# Patient Record
Sex: Male | Born: 1955 | Race: White | Hispanic: No | Marital: Single | Smoking: Never smoker
Health system: Southern US, Community
[De-identification: ages and names within clinical notes are randomized; demographics above are authoritative.]

## PROBLEM LIST (undated history)

## (undated) DIAGNOSIS — F319 Bipolar disorder, unspecified: Secondary | ICD-10-CM

## (undated) DIAGNOSIS — R279 Unspecified lack of coordination: Secondary | ICD-10-CM

## (undated) DIAGNOSIS — D696 Thrombocytopenia, unspecified: Secondary | ICD-10-CM

## (undated) DIAGNOSIS — I2510 Atherosclerotic heart disease of native coronary artery without angina pectoris: Secondary | ICD-10-CM

## (undated) DIAGNOSIS — E871 Hypo-osmolality and hyponatremia: Secondary | ICD-10-CM

## (undated) DIAGNOSIS — G459 Transient cerebral ischemic attack, unspecified: Secondary | ICD-10-CM

## (undated) DIAGNOSIS — E785 Hyperlipidemia, unspecified: Secondary | ICD-10-CM

## (undated) DIAGNOSIS — I7100 Dissection of unspecified site of aorta: Secondary | ICD-10-CM

## (undated) DIAGNOSIS — R451 Restlessness and agitation: Secondary | ICD-10-CM

## (undated) DIAGNOSIS — N189 Chronic kidney disease, unspecified: Secondary | ICD-10-CM

## (undated) DIAGNOSIS — R739 Hyperglycemia, unspecified: Secondary | ICD-10-CM

## (undated) DIAGNOSIS — Z789 Other specified health status: Secondary | ICD-10-CM

## (undated) DIAGNOSIS — E875 Hyperkalemia: Secondary | ICD-10-CM

## (undated) DIAGNOSIS — E860 Dehydration: Secondary | ICD-10-CM

## (undated) DIAGNOSIS — R4182 Altered mental status, unspecified: Secondary | ICD-10-CM

## (undated) DIAGNOSIS — D649 Anemia, unspecified: Secondary | ICD-10-CM

## (undated) DIAGNOSIS — I429 Cardiomyopathy, unspecified: Secondary | ICD-10-CM

## (undated) DIAGNOSIS — I10 Essential (primary) hypertension: Secondary | ICD-10-CM

## (undated) DIAGNOSIS — I639 Cerebral infarction, unspecified: Secondary | ICD-10-CM

## (undated) DIAGNOSIS — G9340 Encephalopathy, unspecified: Secondary | ICD-10-CM

## (undated) DIAGNOSIS — R1310 Dysphagia, unspecified: Secondary | ICD-10-CM

## (undated) DIAGNOSIS — F29 Unspecified psychosis not due to a substance or known physiological condition: Secondary | ICD-10-CM

## (undated) HISTORY — DX: Essential (primary) hypertension: I10

## (undated) HISTORY — PX: ASCENDING AORTIC ANEURYSM REPAIR: SHX1191

## (undated) HISTORY — DX: Dissection of unspecified site of aorta: I71.00

## (undated) HISTORY — DX: Cerebral infarction, unspecified: I63.9

---

## 2000-09-27 ENCOUNTER — Inpatient Hospital Stay (HOSPITAL_COMMUNITY): Admission: AD | Admit: 2000-09-27 | Discharge: 2000-10-04 | Payer: Self-pay | Admitting: Cardiology

## 2000-09-28 ENCOUNTER — Encounter: Payer: Self-pay | Admitting: Neurology

## 2000-09-28 ENCOUNTER — Encounter: Payer: Self-pay | Admitting: Pediatrics

## 2000-10-03 ENCOUNTER — Encounter: Payer: Self-pay | Admitting: Internal Medicine

## 2000-10-26 ENCOUNTER — Encounter: Payer: Self-pay | Admitting: Cardiology

## 2000-12-13 ENCOUNTER — Encounter (HOSPITAL_COMMUNITY): Admission: RE | Admit: 2000-12-13 | Discharge: 2001-01-12 | Payer: Self-pay | Admitting: Pulmonary Disease

## 2001-01-14 ENCOUNTER — Encounter (HOSPITAL_COMMUNITY): Admission: RE | Admit: 2001-01-14 | Discharge: 2001-02-13 | Payer: Self-pay | Admitting: Pulmonary Disease

## 2001-11-27 ENCOUNTER — Ambulatory Visit (HOSPITAL_COMMUNITY): Admission: RE | Admit: 2001-11-27 | Discharge: 2001-11-27 | Payer: Self-pay | Admitting: Pulmonary Disease

## 2002-02-18 ENCOUNTER — Encounter: Admission: RE | Admit: 2002-02-18 | Discharge: 2002-05-19 | Payer: Self-pay | Admitting: Pulmonary Disease

## 2003-11-12 ENCOUNTER — Emergency Department (HOSPITAL_COMMUNITY): Admission: EM | Admit: 2003-11-12 | Discharge: 2003-11-12 | Payer: Self-pay | Admitting: Emergency Medicine

## 2003-11-14 ENCOUNTER — Emergency Department (HOSPITAL_COMMUNITY): Admission: EM | Admit: 2003-11-14 | Discharge: 2003-11-14 | Payer: Self-pay | Admitting: Emergency Medicine

## 2005-08-31 ENCOUNTER — Inpatient Hospital Stay (HOSPITAL_COMMUNITY): Admission: EM | Admit: 2005-08-31 | Discharge: 2005-09-03 | Payer: Self-pay | Admitting: Emergency Medicine

## 2005-09-01 ENCOUNTER — Encounter: Payer: Self-pay | Admitting: Cardiology

## 2005-09-01 ENCOUNTER — Ambulatory Visit: Payer: Self-pay | Admitting: Cardiology

## 2005-09-02 ENCOUNTER — Ambulatory Visit: Payer: Self-pay | Admitting: Psychiatry

## 2006-12-21 ENCOUNTER — Inpatient Hospital Stay: Payer: Self-pay

## 2006-12-22 ENCOUNTER — Other Ambulatory Visit: Payer: Self-pay

## 2007-03-20 ENCOUNTER — Encounter: Payer: Self-pay | Admitting: Cardiothoracic Surgery

## 2007-03-20 ENCOUNTER — Inpatient Hospital Stay (HOSPITAL_COMMUNITY): Admission: RE | Admit: 2007-03-20 | Discharge: 2007-04-04 | Payer: Self-pay | Admitting: Cardiothoracic Surgery

## 2007-03-20 ENCOUNTER — Encounter: Payer: Self-pay | Admitting: Pulmonary Disease

## 2007-03-20 ENCOUNTER — Ambulatory Visit: Payer: Self-pay | Admitting: Cardiothoracic Surgery

## 2007-04-09 ENCOUNTER — Emergency Department (HOSPITAL_COMMUNITY): Admission: EM | Admit: 2007-04-09 | Discharge: 2007-04-09 | Payer: Self-pay | Admitting: *Deleted

## 2007-04-18 ENCOUNTER — Ambulatory Visit: Payer: Self-pay | Admitting: Cardiovascular Disease

## 2007-06-25 ENCOUNTER — Emergency Department (HOSPITAL_COMMUNITY): Admission: EM | Admit: 2007-06-25 | Discharge: 2007-06-25 | Payer: Self-pay | Admitting: Emergency Medicine

## 2007-06-26 ENCOUNTER — Inpatient Hospital Stay (HOSPITAL_COMMUNITY): Admission: AD | Admit: 2007-06-26 | Discharge: 2007-07-02 | Payer: Self-pay | Admitting: Psychiatry

## 2007-06-26 ENCOUNTER — Other Ambulatory Visit: Payer: Self-pay | Admitting: Emergency Medicine

## 2007-06-26 ENCOUNTER — Ambulatory Visit: Payer: Self-pay | Admitting: Psychiatry

## 2007-07-29 ENCOUNTER — Ambulatory Visit: Payer: Self-pay | Admitting: Cardiology

## 2007-07-29 ENCOUNTER — Ambulatory Visit (HOSPITAL_COMMUNITY): Admission: RE | Admit: 2007-07-29 | Discharge: 2007-07-29 | Payer: Self-pay | Admitting: Internal Medicine

## 2007-08-05 ENCOUNTER — Ambulatory Visit: Payer: Self-pay | Admitting: Internal Medicine

## 2009-01-21 ENCOUNTER — Ambulatory Visit (HOSPITAL_COMMUNITY): Admission: RE | Admit: 2009-01-21 | Discharge: 2009-01-21 | Payer: Self-pay | Admitting: Pulmonary Disease

## 2009-01-23 ENCOUNTER — Emergency Department (HOSPITAL_COMMUNITY): Admission: EM | Admit: 2009-01-23 | Discharge: 2009-01-23 | Payer: Self-pay | Admitting: Emergency Medicine

## 2009-02-12 ENCOUNTER — Encounter: Payer: Self-pay | Admitting: Internal Medicine

## 2009-03-12 DIAGNOSIS — I7100 Dissection of unspecified site of aorta: Secondary | ICD-10-CM | POA: Insufficient documentation

## 2009-03-12 DIAGNOSIS — I10 Essential (primary) hypertension: Secondary | ICD-10-CM

## 2009-12-06 ENCOUNTER — Inpatient Hospital Stay (HOSPITAL_COMMUNITY): Admission: EM | Admit: 2009-12-06 | Discharge: 2009-12-14 | Payer: Self-pay | Admitting: Emergency Medicine

## 2009-12-06 ENCOUNTER — Ambulatory Visit: Payer: Self-pay | Admitting: Cardiology

## 2009-12-07 ENCOUNTER — Encounter (INDEPENDENT_AMBULATORY_CARE_PROVIDER_SITE_OTHER): Payer: Self-pay | Admitting: Pulmonary Disease

## 2010-02-21 ENCOUNTER — Ambulatory Visit: Payer: Self-pay | Admitting: Cardiovascular Disease

## 2010-02-21 ENCOUNTER — Encounter: Payer: Self-pay | Admitting: Cardiovascular Disease

## 2010-02-21 ENCOUNTER — Ambulatory Visit (HOSPITAL_COMMUNITY): Admission: RE | Admit: 2010-02-21 | Discharge: 2010-02-21 | Payer: Self-pay | Admitting: Cardiovascular Disease

## 2010-02-21 DIAGNOSIS — I43 Cardiomyopathy in diseases classified elsewhere: Secondary | ICD-10-CM

## 2010-02-23 ENCOUNTER — Encounter: Payer: Self-pay | Admitting: Cardiovascular Disease

## 2010-09-25 ENCOUNTER — Encounter: Payer: Self-pay | Admitting: Cardiothoracic Surgery

## 2010-10-04 NOTE — Letter (Signed)
Summary: [place] Results Engineer, agricultural at [facility]  [address], [place] [address]   Phone: [phone]  Fax: [phone]      February 21, 2010 MRN: [id number]   [facility] [address] [place], [place]  [address]   Dear Mr. Benedetti,  Your test ordered by Selena Batten has been reviewed by your physician (or physician assistant) and was found to be normal or stable. Your physician (or physician assistant) felt no changes were needed at this time.  ____ Echocardiogram  ____ Cardiac Stress Test  ____ Lab Work  ____ Peripheral vascular study of arms, legs or neck  __X__ CT scan or X-ray (chest)  ____ Lung or Breathing test  ____ Other: Please continue on current medical treatment.  Thank you.  Charlton Haws, MD, F.A.C.C

## 2010-10-04 NOTE — Assessment & Plan Note (Signed)
Summary: ROV  Medications Added RESTORIL 15 MG CAPS (TEMAZEPAM) TAKE 1 TAB AT BEDTIME LABETALOL HCL 200 MG TABS (LABETALOL HCL) TAKE 1 TAB two times a day POTASSIUM CHLORIDE 20 MEQ PACK (POTASSIUM CHLORIDE) TAKE 1 TAB DAILY GLYBURIDE 5 MG TABS (GLYBURIDE) TAKE 1 TAB DAILY ASPIRIN 325 MG TABS (ASPIRIN) TAKE 1 TAB DAILY HALOPERIDOL 1 MG TABS (HALOPERIDOL) TAKE 1 TAB two times a day DIVALPROEX SODIUM 250 MG TBEC (DIVALPROEX SODIUM) TAKE 1 TAB two times a day SIMVASTATIN 40 MG TABS (SIMVASTATIN) TAKE 1 TAB DAILY CLONIDINE HCL 0.3 MG TABS (CLONIDINE HCL) TAKE 1 TAB DAILY RAMIPRIL 10 MG CAPS (RAMIPRIL) TAKE 1 TAB DAILY      Allergies Added: NKDA  Visit Type:  Follow-up Primary Provider:  Nehemiah Settle  CC:  NO CARDIOLOGY COMPLAINTS.  History of Present Illness: Derrick Butler is a patient most recently seen by Dr Tenny Craw.  Multiple previous visits with Dr Dietrich Pates and Wall.  Previous history of Aortic disection with repair by Dr Morton Peters in 2008.  History of decreased LV function ? HTN DCM.  Echo in 2006 showed improvement to 50-55%.  CRF's include HTN, DM his lipid status is unknown to me.  He has an ill defined psychiatric illness and has had a previous CVA  He denies SSCP, dsypnea, palpitaitons, syncope or edema.  He is compliant with his meds.  Current Problems (verified): 1)  Aortic Dissection  (ICD-441.00) 2)  Hypertension, Unspecified  (ICD-401.9)  Current Medications (verified): 1)  Ramipril 10 Mg Caps (Ramipril) .... Take One Capsule By Mouth Daily 2)  Restoril 15 Mg Caps (Temazepam) .... Take 1 Tab At Bedtime 3)  Labetalol Hcl 200 Mg Tabs (Labetalol Hcl) .... Take 1 Tab Two Times A Day 4)  Potassium Chloride 20 Meq Pack (Potassium Chloride) .... Take 1 Tab Daily 5)  Glyburide 5 Mg Tabs (Glyburide) .... Take 1 Tab Daily 6)  Aspirin 325 Mg Tabs (Aspirin) .... Take 1 Tab Daily 7)  Haloperidol 1 Mg Tabs (Haloperidol) .... Take 1 Tab Two Times A Day  Allergies (verified): No Known  Drug Allergies  Past History:  Past Medical History: Last updated: 03/12/2009 AORTIC DISSECTION (ICD-441.00) HYPERTENSION, UNSPECIFIED (ICD-401.9) CVA Diabetes Cardiomhopathy    Past Surgical History: Last updated: 03/12/2009 Repair of ascending aorta.  Family History: Last updated: 03/12/2009 Mother with hx Alzheimer's  Father had Heart problemsf CVA  Social History: Last updated: 03/12/2009 Tobacco Use - No.  Alcohol Use - no  Review of Systems       Denies fever, malais, weight loss, blurry vision, decreased visual acuity, cough, sputum, SOB, hemoptysis, pleuritic pain, palpitaitons, heartburn, abdominal pain, melena, lower extremity edema, claudication, or rash.   Vital Signs:  Patient profile:   55 year old male Height:      68 inches Weight:      179 pounds BMI:     27.32 Pulse rate:   61 / minute Pulse rhythm:   regular Resp:     12 per minute BP sitting:   124 / 75  (right arm)  Vitals Entered By: Dreama Saa, CNA (February 21, 2010 11:45 AM)  Physical Exam  General:  Affect appropriate Healthy:  appears stated age HEENT: normal Neck supple with no adenopathy JVP normal no bruits no thyromegaly Lungs clear with no wheezing and good diaphragmatic motion Heart:  S1/S2 no murmur,rub, gallop or click PMI normal Abdomen: benighn, BS positve, no tenderness, no AAA no bruit.  No HSM or HJR Distal pulses intact with  no bruits No edema Neuro non-focal Skin warm and dry    Impression & Recommendations:  Problem # 1:  AORTIC DISSECTION (ICD-441.00) F/U CXR today to screen for mediastinal size.  no pain  Problem # 2:  HYPERTENSION, UNSPECIFIED (ICD-401.9) Well controlled The following medications were removed from the medication list:    Ramipril 10 Mg Caps (Ramipril) .[name]... Take one capsule by mouth daily His updated medication list for this problem includes:    Labetalol Hcl 200 Mg Tabs (Labetalol hcl) .[name]... Take 1 tab two times a day    Aspirin  325 Mg Tabs (Aspirin) .[name]... Take 1 tab daily    Clonidine Hcl 0.3 Mg Tabs (Clonidine hcl) .[name]... Take 1 tab daily    Ramipril 10 Mg Caps (Ramipril) .[name]... Take 1 tab daily  Problem # 3:  CARDIOMYOPATHY OTHER DISEASES CLASSIFIED ELSW (ICD-425.8)  No signs of CHF.  F/U echo to reassess EF.  No history of CAD The following medications were removed from the medication list:    Ramipril 10 Mg Caps (Ramipril) .[name]... Take one capsule by mouth daily His updated medication list for this problem includes:    Labetalol Hcl 200 Mg Tabs (Labetalol hcl) .[name]... Take 1 tab two times a day    Aspirin 325 Mg Tabs (Aspirin) .[name]... Take 1 tab daily    Ramipril 10 Mg Caps (Ramipril) .[name]... Take 1 tab daily  Orders: T-Chest x-ray, 2 views ([address]) 2-D Echocardiogram (2D Echo)  Patient Instructions: 1)  Your physician recommends that you schedule a follow-up appointment in: 1 year 2)  Your physician recommends that you continue on your current medications as directed. Please refer to the Current Medication list given to you today. 3)  A chest x-ray takes a picture of the organs and structures inside the chest, including the heart, lungs, and blood vessels. This test can show several things, including, whether the heart is enlarged; whether fluid is building up in the lungs; and whether pacemaker / defibrillator leads are still in place. 4)  Your physician has requested that you have an echocardiogram.  Echocardiography is a painless test that uses sound waves to create images of your heart. It provides your doctor with information about the size and shape of your heart and how well your heart's chambers and valves are working.  This procedure takes approximately one hour. There are no restrictions for this procedure.

## 2010-10-04 NOTE — Letter (Signed)
Summary: [place] Results Engineer, agricultural at [facility]  [address] [place] [address]   Phone: [phone]  Fax: [phone]      February 23, 2010 MRN: [id number]   [facility] [address] [place], [place]  [address]   Dear Mr. Schlender,  Your test ordered by Selena Batten has been reviewed by your physician (or physician assistant) and was found to be normal or stable. Your physician (or physician assistant) felt no changes were needed at this time.  __X__ Echocardiogram  ____ Cardiac Stress Test  ____ Lab Work  ____ Peripheral vascular study of arms, legs or neck  ____ CT scan or X-ray  ____ Lung or Breathing test  ____ Other: Please continue on current medical treatment.  Thank you.   Charlton Haws, MD, F.A.C.C

## 2010-11-23 LAB — GLUCOSE, CAPILLARY
Glucose-Capillary: 112 mg/dL — ABNORMAL HIGH (ref 70–99)
Glucose-Capillary: 124 mg/dL — ABNORMAL HIGH (ref 70–99)
Glucose-Capillary: 131 mg/dL — ABNORMAL HIGH (ref 70–99)
Glucose-Capillary: 138 mg/dL — ABNORMAL HIGH (ref 70–99)
Glucose-Capillary: 138 mg/dL — ABNORMAL HIGH (ref 70–99)
Glucose-Capillary: 140 mg/dL — ABNORMAL HIGH (ref 70–99)
Glucose-Capillary: 141 mg/dL — ABNORMAL HIGH (ref 70–99)
Glucose-Capillary: 158 mg/dL — ABNORMAL HIGH (ref 70–99)
Glucose-Capillary: 174 mg/dL — ABNORMAL HIGH (ref 70–99)
Glucose-Capillary: 199 mg/dL — ABNORMAL HIGH (ref 70–99)
Glucose-Capillary: 202 mg/dL — ABNORMAL HIGH (ref 70–99)
Glucose-Capillary: 206 mg/dL — ABNORMAL HIGH (ref 70–99)
Glucose-Capillary: 206 mg/dL — ABNORMAL HIGH (ref 70–99)
Glucose-Capillary: 214 mg/dL — ABNORMAL HIGH (ref 70–99)
Glucose-Capillary: 229 mg/dL — ABNORMAL HIGH (ref 70–99)
Glucose-Capillary: 244 mg/dL — ABNORMAL HIGH (ref 70–99)
Glucose-Capillary: 245 mg/dL — ABNORMAL HIGH (ref 70–99)
Glucose-Capillary: 247 mg/dL — ABNORMAL HIGH (ref 70–99)
Glucose-Capillary: 305 mg/dL — ABNORMAL HIGH (ref 70–99)
Glucose-Capillary: 416 mg/dL — ABNORMAL HIGH (ref 70–99)
Glucose-Capillary: >600 mg/dL (ref 70–99)

## 2010-11-23 LAB — BASIC METABOLIC PANEL
BUN: 15 mg/dL (ref 6–23)
BUN: 18 mg/dL (ref 6–23)
BUN: 27 mg/dL — ABNORMAL HIGH (ref 6–23)
BUN: 30 mg/dL — ABNORMAL HIGH (ref 6–23)
BUN: 7 mg/dL (ref 6–23)
CO2: 15 mEq/L — ABNORMAL LOW (ref 19–32)
CO2: 22 mEq/L (ref 19–32)
Calcium: 8.1 mg/dL — ABNORMAL LOW (ref 8.4–10.5)
Calcium: 8.8 mg/dL (ref 8.4–10.5)
Chloride: 110 mEq/L (ref 96–112)
Chloride: 116 mEq/L — ABNORMAL HIGH (ref 96–112)
Chloride: 94 mEq/L — ABNORMAL LOW (ref 96–112)
Creatinine, Ser: 1.48 mg/dL (ref 0.4–1.5)
Creatinine, Ser: 2.13 mg/dL — ABNORMAL HIGH (ref 0.4–1.5)
GFR calc Af Amer: 36 mL/min — ABNORMAL LOW (ref >60)
GFR calc Af Amer: >60 mL/min (ref >60)
GFR calc non Af Amer: 45 mL/min — ABNORMAL LOW (ref >60)
Glucose, Bld: 125 mg/dL — ABNORMAL HIGH (ref 70–99)
Glucose, Bld: 149 mg/dL — ABNORMAL HIGH (ref 70–99)
Glucose, Bld: 302 mg/dL — ABNORMAL HIGH (ref 70–99)
Glucose, Bld: 742 mg/dL (ref 70–99)
Potassium: 4 mEq/L (ref 3.5–5.1)
Potassium: 4.3 mEq/L (ref 3.5–5.1)
Potassium: 5.3 mEq/L — ABNORMAL HIGH (ref 3.5–5.1)
Sodium: 139 mEq/L (ref 135–145)

## 2010-11-23 LAB — CBC
HCT: 33 % — ABNORMAL LOW (ref 39.0–52.0)
HCT: 34.6 % — ABNORMAL LOW (ref 39.0–52.0)
Hemoglobin: 12.5 g/dL — ABNORMAL LOW (ref 13.0–17.0)
Hemoglobin: 13.9 g/dL (ref 13.0–17.0)
MCHC: 36.1 g/dL — ABNORMAL HIGH (ref 30.0–36.0)
MCHC: 36.2 g/dL — ABNORMAL HIGH (ref 30.0–36.0)
MCV: 86 fL (ref 78.0–100.0)
MCV: 86.2 fL (ref 78.0–100.0)
MCV: 87 fL (ref 78.0–100.0)
Platelets: 100 10*3/uL — ABNORMAL LOW (ref 150–400)
Platelets: 79 10*3/uL — ABNORMAL LOW (ref 150–400)
Platelets: 87 10*3/uL — ABNORMAL LOW (ref 150–400)
RBC: 3.99 MIL/uL — ABNORMAL LOW (ref 4.22–5.81)
RBC: 4.01 MIL/uL — ABNORMAL LOW (ref 4.22–5.81)
RBC: 4.54 MIL/uL (ref 4.22–5.81)
RDW: 12.7 % (ref 11.5–15.5)
RDW: 12.9 % (ref 11.5–15.5)
RDW: 13.1 % (ref 11.5–15.5)
WBC: 6.2 10*3/uL (ref 4.0–10.5)
WBC: 8.1 10*3/uL (ref 4.0–10.5)

## 2010-11-23 LAB — BLOOD GAS, ARTERIAL
Bicarbonate: 14.1 mEq/L — ABNORMAL LOW (ref 20.0–24.0)
FIO2: 21 %
O2 Saturation: 96.6 %
pCO2 arterial: 26.5 mmHg — ABNORMAL LOW (ref 35.0–45.0)
pO2, Arterial: 86.3 mmHg (ref 80.0–100.0)

## 2010-11-23 LAB — URINALYSIS, ROUTINE W REFLEX MICROSCOPIC
Bilirubin Urine: NEGATIVE
Glucose, UA: >1000 mg/dL — AB
Ketones, ur: 15 mg/dL — AB
Leukocytes, UA: NEGATIVE
Nitrite: NEGATIVE
Protein, ur: NEGATIVE mg/dL
Specific Gravity, Urine: <1.005 — ABNORMAL LOW (ref 1.005–1.030)
pH: 5.5 (ref 5.0–8.0)

## 2010-11-23 LAB — COMPREHENSIVE METABOLIC PANEL
ALT: 16 U/L (ref 0–53)
AST: 28 U/L (ref 0–37)
Albumin: 3.1 g/dL — ABNORMAL LOW (ref 3.5–5.2)
BUN: 27 mg/dL — ABNORMAL HIGH (ref 6–23)
CO2: 14 mEq/L — ABNORMAL LOW (ref 19–32)
CO2: 16 mEq/L — ABNORMAL LOW (ref 19–32)
Calcium: 8.5 mg/dL (ref 8.4–10.5)
Chloride: 110 mEq/L (ref 96–112)
Creatinine, Ser: 1.96 mg/dL — ABNORMAL HIGH (ref 0.4–1.5)
Creatinine, Ser: 2.14 mg/dL — ABNORMAL HIGH (ref 0.4–1.5)
GFR calc Af Amer: 44 mL/min — ABNORMAL LOW (ref >60)
GFR calc non Af Amer: 32 mL/min — ABNORMAL LOW (ref >60)
GFR calc non Af Amer: 36 mL/min — ABNORMAL LOW (ref >60)
Glucose, Bld: 289 mg/dL — ABNORMAL HIGH (ref 70–99)
Potassium: 4.2 mEq/L (ref 3.5–5.1)
Sodium: 137 mEq/L (ref 135–145)
Total Bilirubin: 0.8 mg/dL (ref 0.3–1.2)
Total Bilirubin: 1 mg/dL (ref 0.3–1.2)

## 2010-11-23 LAB — DIFFERENTIAL
Basophils Absolute: 0 10*3/uL (ref 0.0–0.1)
Basophils Absolute: 0 10*3/uL (ref 0.0–0.1)
Basophils Absolute: 0 10*3/uL (ref 0.0–0.1)
Basophils Absolute: 0 10*3/uL (ref 0.0–0.1)
Basophils Absolute: 0 10*3/uL (ref 0.0–0.1)
Basophils Relative: 1 % (ref 0–1)
Basophils Relative: 1 % (ref 0–1)
Basophils Relative: 1 % (ref 0–1)
Eosinophils Absolute: 0.1 10*3/uL (ref 0.0–0.7)
Eosinophils Absolute: 0.1 10*3/uL (ref 0.0–0.7)
Eosinophils Absolute: 0.1 10*3/uL (ref 0.0–0.7)
Eosinophils Absolute: 0.2 10*3/uL (ref 0.0–0.7)
Eosinophils Relative: 2 % (ref 0–5)
Eosinophils Relative: 2 % (ref 0–5)
Eosinophils Relative: 3 % (ref 0–5)
Lymphocytes Relative: 27 % (ref 12–46)
Lymphocytes Relative: 27 % (ref 12–46)
Lymphocytes Relative: 36 % (ref 12–46)
Lymphs Abs: 1.6 10*3/uL (ref 0.7–4.0)
Monocytes Absolute: 0.3 10*3/uL (ref 0.1–1.0)
Monocytes Absolute: 0.4 10*3/uL (ref 0.1–1.0)
Monocytes Absolute: 0.6 10*3/uL (ref 0.1–1.0)
Monocytes Relative: 6 % (ref 3–12)
Monocytes Relative: 8 % (ref 3–12)
Neutro Abs: 2.2 10*3/uL (ref 1.7–7.7)
Neutro Abs: 4.1 10*3/uL (ref 1.7–7.7)
Neutrophils Relative %: 50 % (ref 43–77)

## 2010-11-23 LAB — RAPID URINE DRUG SCREEN, HOSP PERFORMED
Barbiturates: NOT DETECTED
Opiates: NOT DETECTED
Tetrahydrocannabinol: NOT DETECTED

## 2010-11-23 LAB — URINE CULTURE: Colony Count: 50000

## 2010-11-23 LAB — URINE MICROSCOPIC-ADD ON

## 2010-11-23 LAB — CARDIAC PANEL(CRET KIN+CKTOT+MB+TROPI)
CK, MB: 3.5 ng/mL (ref 0.3–4.0)
CK, MB: 6.9 ng/mL (ref 0.3–4.0)
CK, MB: 9.4 ng/mL (ref 0.3–4.0)
Relative Index: 0.4 (ref 0.0–2.5)
Relative Index: 0.6 (ref 0.0–2.5)
Total CK: 1099 U/L — ABNORMAL HIGH (ref 7–232)
Total CK: 1442 U/L — ABNORMAL HIGH (ref 7–232)
Total CK: 1934 U/L — ABNORMAL HIGH (ref 7–232)
Total CK: 919 U/L — ABNORMAL HIGH (ref 7–232)
Troponin I: 0.11 ng/mL — ABNORMAL HIGH (ref 0.00–0.06)
Troponin I: 0.16 ng/mL — ABNORMAL HIGH (ref 0.00–0.06)
Troponin I: 0.17 ng/mL — ABNORMAL HIGH (ref 0.00–0.06)
Troponin I: 0.17 ng/mL — ABNORMAL HIGH (ref 0.00–0.06)

## 2010-11-23 LAB — VALPROIC ACID LEVEL: Valproic Acid Lvl: <10 ug/mL — ABNORMAL LOW (ref 50.0–100.0)

## 2010-11-23 LAB — FOLATE: Folate: 13.6 ng/mL

## 2010-11-23 LAB — AMMONIA: Ammonia: 20 umol/L (ref 11–35)

## 2010-11-23 LAB — FERRITIN: Ferritin: 253 ng/mL (ref 22–322)

## 2010-11-23 LAB — RETICULOCYTES
Retic Count, Absolute: 114.8 10*3/uL (ref 19.0–186.0)
Retic Ct Pct: 2.7 % (ref 0.4–3.1)

## 2010-11-23 LAB — MRSA PCR SCREENING: MRSA by PCR: NEGATIVE

## 2010-11-23 LAB — CK TOTAL AND CKMB (NOT AT ARMC)
CK, MB: 3.1 ng/mL (ref 0.3–4.0)
Relative Index: 2.6 — ABNORMAL HIGH (ref 0.0–2.5)
Total CK: 121 U/L (ref 7–232)

## 2010-11-23 LAB — D-DIMER, QUANTITATIVE: D-Dimer, Quant: 1.02 ug/mL-FEU — ABNORMAL HIGH (ref 0.00–0.48)

## 2010-12-13 ENCOUNTER — Emergency Department (HOSPITAL_COMMUNITY)
Admission: EM | Admit: 2010-12-13 | Discharge: 2010-12-13 | Disposition: A | Discharge status: Home / Self Care | Payer: Medicare Other | Attending: Emergency Medicine | Admitting: Emergency Medicine

## 2010-12-13 DIAGNOSIS — I10 Essential (primary) hypertension: Secondary | ICD-10-CM | POA: Insufficient documentation

## 2010-12-13 DIAGNOSIS — Z7982 Long term (current) use of aspirin: Secondary | ICD-10-CM | POA: Insufficient documentation

## 2010-12-13 DIAGNOSIS — N390 Urinary tract infection, site not specified: Secondary | ICD-10-CM | POA: Insufficient documentation

## 2010-12-13 DIAGNOSIS — I2510 Atherosclerotic heart disease of native coronary artery without angina pectoris: Secondary | ICD-10-CM | POA: Insufficient documentation

## 2010-12-13 DIAGNOSIS — I252 Old myocardial infarction: Secondary | ICD-10-CM | POA: Insufficient documentation

## 2010-12-13 DIAGNOSIS — Z79899 Other long term (current) drug therapy: Secondary | ICD-10-CM | POA: Insufficient documentation

## 2010-12-13 DIAGNOSIS — IMO0002 Reserved for concepts with insufficient information to code with codable children: Secondary | ICD-10-CM | POA: Insufficient documentation

## 2010-12-13 DIAGNOSIS — F319 Bipolar disorder, unspecified: Secondary | ICD-10-CM | POA: Insufficient documentation

## 2010-12-13 DIAGNOSIS — Z8673 Personal history of transient ischemic attack (TIA), and cerebral infarction without residual deficits: Secondary | ICD-10-CM | POA: Insufficient documentation

## 2010-12-13 DIAGNOSIS — E119 Type 2 diabetes mellitus without complications: Secondary | ICD-10-CM | POA: Insufficient documentation

## 2010-12-13 LAB — URINALYSIS, ROUTINE W REFLEX MICROSCOPIC
Glucose, UA: NEGATIVE mg/dL
Ketones, ur: NEGATIVE mg/dL
Nitrite: NEGATIVE
Specific Gravity, Urine: >1.03 — ABNORMAL HIGH (ref 1.005–1.030)
pH: 5.5 (ref 5.0–8.0)

## 2010-12-13 LAB — BASIC METABOLIC PANEL
Calcium: 9.1 mg/dL (ref 8.4–10.5)
GFR calc Af Amer: 41 mL/min — ABNORMAL LOW (ref >60)
GFR calc non Af Amer: 34 mL/min — ABNORMAL LOW (ref >60)
Glucose, Bld: 102 mg/dL — ABNORMAL HIGH (ref 70–99)
Potassium: 4.9 mEq/L (ref 3.5–5.1)
Sodium: 138 mEq/L (ref 135–145)

## 2010-12-13 LAB — DIFFERENTIAL
Basophils Absolute: 0 10*3/uL (ref 0.0–0.1)
Basophils Relative: 1 % (ref 0–1)
Eosinophils Absolute: 0.2 10*3/uL (ref 0.0–0.7)
Eosinophils Relative: 3 % (ref 0–5)
Lymphocytes Relative: 36 % (ref 12–46)
Monocytes Absolute: 0.3 10*3/uL (ref 0.1–1.0)

## 2010-12-13 LAB — URINE MICROSCOPIC-ADD ON

## 2010-12-13 LAB — CBC
MCHC: 35.6 g/dL (ref 30.0–36.0)
Platelets: 104 10*3/uL — ABNORMAL LOW (ref 150–400)
RDW: 12 % (ref 11.5–15.5)
WBC: 5.8 10*3/uL (ref 4.0–10.5)

## 2010-12-15 LAB — URINE CULTURE
Colony Count: 6000
Culture  Setup Time: 201204110531

## 2011-01-05 ENCOUNTER — Emergency Department (HOSPITAL_COMMUNITY)
Admission: EM | Admit: 2011-01-05 | Discharge: 2011-01-05 | Disposition: A | Discharge status: Home / Self Care | Payer: Medicare Other | Attending: Emergency Medicine | Admitting: Emergency Medicine

## 2011-01-05 DIAGNOSIS — I10 Essential (primary) hypertension: Secondary | ICD-10-CM | POA: Insufficient documentation

## 2011-01-05 DIAGNOSIS — E860 Dehydration: Secondary | ICD-10-CM | POA: Insufficient documentation

## 2011-01-05 DIAGNOSIS — I252 Old myocardial infarction: Secondary | ICD-10-CM | POA: Insufficient documentation

## 2011-01-05 DIAGNOSIS — Z8673 Personal history of transient ischemic attack (TIA), and cerebral infarction without residual deficits: Secondary | ICD-10-CM | POA: Insufficient documentation

## 2011-01-05 DIAGNOSIS — G939 Disorder of brain, unspecified: Secondary | ICD-10-CM | POA: Insufficient documentation

## 2011-01-05 DIAGNOSIS — F319 Bipolar disorder, unspecified: Secondary | ICD-10-CM | POA: Insufficient documentation

## 2011-01-05 DIAGNOSIS — I2510 Atherosclerotic heart disease of native coronary artery without angina pectoris: Secondary | ICD-10-CM | POA: Insufficient documentation

## 2011-01-05 DIAGNOSIS — Z7982 Long term (current) use of aspirin: Secondary | ICD-10-CM | POA: Insufficient documentation

## 2011-01-05 DIAGNOSIS — E119 Type 2 diabetes mellitus without complications: Secondary | ICD-10-CM | POA: Insufficient documentation

## 2011-01-05 DIAGNOSIS — F29 Unspecified psychosis not due to a substance or known physiological condition: Secondary | ICD-10-CM | POA: Insufficient documentation

## 2011-01-05 DIAGNOSIS — Z79899 Other long term (current) drug therapy: Secondary | ICD-10-CM | POA: Insufficient documentation

## 2011-01-05 DIAGNOSIS — L408 Other psoriasis: Secondary | ICD-10-CM | POA: Insufficient documentation

## 2011-01-05 LAB — BASIC METABOLIC PANEL
BUN: 38 mg/dL — ABNORMAL HIGH (ref 6–23)
CO2: 21 mEq/L (ref 19–32)
Calcium: 9.2 mg/dL (ref 8.4–10.5)
Chloride: 105 mEq/L (ref 96–112)
Creatinine, Ser: 2.33 mg/dL — ABNORMAL HIGH (ref 0.4–1.5)

## 2011-01-05 LAB — CBC
Hemoglobin: 13.5 g/dL (ref 13.0–17.0)
MCH: 30.7 pg (ref 26.0–34.0)
MCHC: 35.4 g/dL (ref 30.0–36.0)
MCV: 86.6 fL (ref 78.0–100.0)
Platelets: 108 10*3/uL — ABNORMAL LOW (ref 150–400)
RBC: 4.4 MIL/uL (ref 4.22–5.81)

## 2011-01-05 LAB — DIFFERENTIAL
Eosinophils Absolute: 0.2 10*3/uL (ref 0.0–0.7)
Lymphs Abs: 1.9 10*3/uL (ref 0.7–4.0)
Monocytes Absolute: 0.5 10*3/uL (ref 0.1–1.0)
Monocytes Relative: 7 % (ref 3–12)
Neutrophils Relative %: 61 % (ref 43–77)

## 2011-01-17 NOTE — Discharge Summary (Signed)
NAME:  Derrick Butler, Derrick Butler NO.:  [id number]   MEDICAL RECORD NO.:  [id number]          PATIENT TYPE:  INP   LOCATION:  2017                         FACILITY:  [facility]   PHYSICIAN:  Sheliah Plane, MD    DATE OF BIRTH:  1956/07/07   DATE OF ADMISSION:  03/20/2007  DATE OF DISCHARGE:                               DISCHARGE SUMMARY   HISTORY OF THE PRESENT ILLNESS:  The patient is a 55 year old male with  a previous known history of hypertensive cardiomyopathy, with a  documented ejection fraction of 33%.  He reportedly presented to his  medical doctor on the day prior to admission with hypertension.  At that  time, his medications were changed. At approximately 3 a.m. on the date  of admission, he developed upper sternal burning and presented to the  emergency room at [facility].  He was initially seen at 6:01  a.m. at [facility], and a CT scan done at approximately 10:00 revealed  evidence of a type 1 aortic dissection.  He was transferred to [facility] for surgical evaluation and operative repair.  Of note,  the patient presented with acute psychosis.  He was barely able to speak  his name, but did follow simple commands, moving all extremities to  command, with equal grip.  He was felt to be oversedated upon arrival to  the hospital as well.  He was unable to give any history on his own, and  therefore other records were gleaned from his previous records  available.   PAST MEDICAL HISTORY:  1. Hypertension, with known hypertensive cardiomyopathy.  2. Type 2 diabetes mellitus.  3. Chronic renal insufficiency, with reportedly baseline creatinine of      approximately 1.7.  4. He also has a history of previous psychosis, having been treated by      a psychiatrist.  5. He also has a history of a non-ST elevation myocardial infarction      in 2006.   FAMILY HISTORY:  Unobtainable.   SOCIAL HISTORY:  The patient was found to be a nonsmoker,  nondrinker.   MEDICATIONS PRIOR TO ADMISSION FROM HIS PUBLISHED PREVIOUS RECORDS:  1. Glyburide 5 mg twice daily.  2. Metformin 500 mg p.o. b.i.d.  3. Trazodone 100-200 mg p.r.n.  4. Nitroglycerin p.r.n.  5. Plavix 75 mg daily.  6. Lisinopril 20 mg daily  7. Toprol-XL 50 mg daily.  8. Clonidine 0.2 mg every morning, and 0.1 mg q.evening.   REVIEW OF SYMPTOMS/PHYSICAL EXAMINATION:  Please see the history and  physical notes on admission.   LABORATORY VALUES RECEIVED FROM [facility]:  Included a CT scan  which revealed a type 1 aortic dissection.  There was evidence of a  significant pericardial hematoma with tamponade as well.   The patient was unable to give consent, but through his power of  attorney consent was obtained to proceed with the surgical repair.   PROCEDURE:  The patient was taken from the emergency department to the  operating room.  He underwent the following procedure:  Replacement of  ascending aorta, with  resuspension of aortic valve, with circulatory  arrest and cardiopulmonary bypass.  Procedure was performed by Sheliah Plane, M.D.  He tolerated it well, and he was taken to the surgical  intensive care unit in serious condition.   POSTOPERATIVE HOSPITAL COURSE:  The patient has overall done well.  He  has required treatment for postoperative coagulopathy, including  transfusion of packed cells, platelets, and fresh frozen plasma.  He  improved following this, and all routine lines, monitors, and drainage  devices have been discontinued in the standard fashion.  He initially  did require some nitroglycerin and Neo-Synephrine, but these were weaned  also without difficulty.  He was weaned from the ventilator following  improvement from his sedation.  He was started on a course of gentle  diuresis, for which he has responded well.  During the postoperative  period, he has had some intermittent confusion but has slowly improved.  Additionally, he has  had some difficulty controlling his blood pressure.  Multiple agents have been added, and this issue is stabilized.  His  activity has slowly increased, and he is tolerating routine cardiac  rehabilitation modalities.  His currently laboratories are stable, with  most recent hemoglobin and hematocrit dated March 26, 2007 at 9.7 and  28.2, respectively.  His electrolytes, BUN, and creatinine are within  normal limits, but they will be checked again prior to discharge.  His  incision is healing well, without evidence of infection.  His diabetes  is under adequate control, and  he is restarted on his metformin.  Tentatively, he is felt to require placement in a nursing facility for  at least a short-term rehabilitation, as he has no significant family  help in the area.  These plans are being managed by the social work and  care management system, and final disposition is currently pending.   CURRENT MEDICATIONS AT THE TIME OF THIS DICTATION:  1. Haldol 5 mg b.i.d.  2. Aspirin 325 mg daily.  3. Colace 200 mg daily.  4. Dulcolax 10 mg daily.  5. Protonix 40 mg daily.  6. Catapres patch 0.3 mg q.7 days.  7. Altace 10 mg b.i.d.  8. Labetalol 200 mg b.i.d.  9. For pain, oxycodone 5 mg q.3 h. p.r.n. as needed.   FOLLOWUP:  The patient will be instructed to follow up with Dr. Tyrone Sage  approximately 3 weeks postdischarge, the office number is [phone], for  an appointment to be arranged, and a chest x-ray will be obtained at  that time.   FINAL DIAGNOSES:  1. Acute type 1 aortic dissection, now status post repair, as      described above.  2. Chronic renal insufficiency.  3. Coronary artery disease, status post myocardial infarction.  4. History of a stroke, with short-term memory difficulty in 2003.  5. History of diabetes.  6. History of hypertension.  7. History of psychiatric illness which is not clearly defined from      the chart records.   INSTRUCTIONS:  The patient will be  instructed for wound care which will  be to clean his incision gently with soap and water.  He is also  instructed on sternal precautions, not to lift greater than 10 pounds  for the next 6 weeks.  He is not to drive.  Capillary blood glucoses  should continue to be monitored.  The patient may shower.  He is  instructed to increase his walking daily per routine cardiac  rehabilitation instructions.  Rowe Clack, P.A.-C.      Sheliah Plane, MD  Electronically Signed    WEG/MEDQ  D:  03/28/2007  T:  03/28/2007  Job:  [id number]   cc:   Ramon Dredge L. Juanetta Gosling, M.D.  Gerrit Friends. Dietrich Pates, MD, [facility]

## 2011-01-17 NOTE — Discharge Summary (Signed)
NAME:  Derrick Butler, Derrick Butler NO.:  [id number]   MEDICAL RECORD NO.:  [id number]          PATIENT TYPE:  IPS   LOCATION:  0404                          [facility]ILITY:  [facility]   PHYSICIAN:  Geoffery Lyons, M.D.      DATE OF BIRTH:  [birth year]   DATE OF ADMISSION:  06/26/2007  DATE OF DISCHARGE:  07/02/2007                               DISCHARGE SUMMARY   CHIEF COMPLAINT/PRESENT ILLNESS:  This was the first admission to [facility] for this 55 year old male voluntarily admitted.  History of depression reported, troubled mind.  Initially assessed to  have some delusional ideas.   PAST PSYCHIATRIC HISTORY:  First time at [facility].   ALCOHOL AND DRUG HISTORY:  Denies active any substance use.   MEDICAL HISTORY:  Arterial hypertension, coronary artery disease,  noninsulin dependent diabetes mellitus, CVA.   MEDICATIONS:  1. Aspirin 81 mg per day.  2. Catapres-TTS-3 change every 7 days.  3. Colace 100 mg per day.  4. Dulcolax 50 mg per day.  5. Haldol 5 mg twice a day.  6. Hydrocodone 10-500 as needed.  7. Labetalol 200 mg per day.  8. Metformin 500 mg twice a day.  9. Altace 10 mg twice a day.  10.Protonix 40 mg per day.   PHYSICAL EXAMINATION:  Reveals a male who is in bed exhibiting poor eye  contact.  He is not spontaneous.  Somewhat irritable.  Would not  volunteer information.  Cognition preserved.   ADMITTING DIAGNOSES:  AXIS I:  Bipolar disorder.  AXIS II:  No diagnosis.  AXIS III:  Noninsulin dependent diabetes mellitus, arterial  hypertension, coronary artery disease.  AXIS IV:  Moderate.  AXIS V:  Upon admission 38.  GAF in the last year 60.   COURSE IN THE HOSPITAL:  He was admitted.  He was started on individual  and group psychotherapy.  He was initially maintained on his  medications.  Initially upon initial assessment, he was assessed to be  delusional, wanting to save people, very intrusive, agitated, restless,  pressured speech.   Residing in an assisted living facility.  He had quit  taking some of his medication, and he felt he was over medicated.  He  was seen at [facility], but he could not be  seen for medication evaluation.  Dr. Juanetta Gosling felt he was decompensated  and recommended him for inpatient treatment.  He was somewhat agitated  with pressured speech.  Endorsed that he felt that he was taking the  wrong medication.  He endorsed that he experienced side effects when he  takes the medication.  He gets ill,  and raises his voice too loud.  Been taking the same meds for 2 or 3 years.  Endorses that he got upset  because this 55 year old man that lived in the assisted living facility  began cussing  at staff.  He said that he was in that assisted living  facility while he recovers, and there is numerous medical conditions.  Seemed to be some underlying paranoia, suspiciousness, feeling that the  medication needed  to be changed.  Diagnosed bipolar and placed on  Haldol, mostly keeping to himself, little  interaction.  He was started  on Depakote.  Eye contact was minimal difficulty.  Difficult to engage  in conversation.  Speech became more pressured as he became more  agitated, somewhat circumstantial, tangential.  He tolerated the  Depakote.  On October 28, he was in full contact with reality.  There  were no active suicidal or homicidal ideas, no hallucinations or  delusions.  He had tolerated the medication well and was willing and  motivated to pursue them.  He was going to return to the assisted living  facility.   DISCHARGE DIAGNOSES:  AXIS I:  Bipolar disorder, impulse control not  otherwise specified.  AXIS II:  No diagnosis.  AXIS III:  Noninsulin dependent diabetes mellitus, arterial  hypertension, coronary artery disease.  AXIS IV:  Moderate.  AXIS V:  Global assessment of function upon discharge 50 to 55.   DISCHARGE MEDICATIONS:  1. Clonidine 0.3 TTS patch  weekly.  2. Colace 100 mg twice a day.  3. Altace 10 mg twice a day.  4. Protonix 40 mg per day.  5. Glucophage 500 mg twice a day.  6. Normodyne 200 mg per day.  7. Dulcolax 10 mg per day.  8. Aspirin 325 mg per day.  9. Haldol 1 mg twice a day.  10.Depakote 250 three times a day.   FOLLOWUP:  Followup [facility].      Geoffery Lyons, M.D.  Electronically Signed     IL/MEDQ  D:  07/23/2007  T:  07/24/2007  Job:  [id number]

## 2011-01-17 NOTE — Assessment & Plan Note (Signed)
[facility]                       [facility]ARDI[facility] OFFICE NOTE   ADMIR, CANDELAS                     MRN:          [id number]  DATE:04/18/2007                            DOB:          [birth year]    Mr. Derrick Butler is seen today at the request of Dr. Tyrone Sage.  I am not quite  sure of the referral.  The note in the chart was to evaluate for heart  surgery.  However, it is pretty clear that the patient has already had  this.  He was admitted on March 20, 2007 after presenting with  hypertension and an acute type 1 aortic dissection.  He was transferred  from [facility] to [facility] for emergent surgery.  At the time, he was  apparently in a coma.  Dr. Tyrone Sage and Dr. Donata Clay did an excellent  job and fixed his type 1 dissection.  He was discharged from the  hospital at the end of July 2008.  He was sent to [facility] as a long-  term skilled care facility.  He does not have family in the area.  He  has been seen on multiple occasions in the past by Dr. Daleen Squibb, Dr. Tenny Craw,  and Dr. Dietrich Pates.  He has primarily been seen for what appears to be  hypertensive cardiomyopathy.  He has had a previous CVA in 2002 and some  sort of psychiatric illness which is poorly defined.   His last echocardiogram in 2006 showed an EF of 50%-55%.  However, his  intraoperative TEE at the time of his aortic dissection done by  anesthesia suggested an EF in the 30%-35% range.  There was trace MR.   The patient's postoperative course was fairly unremarkable.  His  baseline creatinine returned to 1.7.  He had his baseline psychiatric  illness and psychosis.   DISCHARGE MEDICATIONS:  1. Metformin 500 b.i.d.  2. Trazodone 100-200 mg p.r.n.  3. Nitroglycerin.  4. Plavix 75 a day.  5. Lisinopril 20 a day.  6. Toprol 50 a day.  7. Clonidine 0.2 in the morning and 0.1 in the evening.   In talking to the patient, his history is somewhat difficult to elicit.  He has somewhat  halting speech.  At times, he has a blank stare going  forward.  He has no complaints at this time and feels that he is getting  along well after his surgery.   REVIEW OF SYSTEMS:  Otherwise negative.   CURRENT PHYSICAL EXAMINATION:  GENERAL:  Remarkable for a not quite  catatonic but subdued white middle-aged male in no distress.  Affect is  flat.  VITAL SIGNS:  Weight is 180, blood pressure is 115/78 in each arm, pulse  is 78 and regular, he is afebrile, respiratory rate is 14.  HEENT:  Normal.  NECK:  Carotids are normal, without bruit.  There is no lymphadenopathy,  no thyromegaly, no JVP elevation.  LUNGS:  Clear.  CHEST:  Good diaphragmatic motion.  Sternum is well healed.  CARDIAC:  There is an S1, S2, with a soft systolic murmur.  PMI is  normal.  ABDOMEN:  Benign.  There is no AAA, no bruits, no hepatosplenomegaly or  hepatojugular reflux, no tenderness.  EXTREMITIES:  Distal pulses are intact, with no edema.  There is no  bruit.  SKIN:  Warm and dry.  NEUROLOGIC:  Remarkable for some tremulousness in the upper extremities.  He has weakness in his quadriceps and walks hunched over with a walker.  He has decreased muscle tone in his quadriceps and has difficulty  elevating his body on stepping.  However, there are no focal defects  that I can tell.   IMPRESSION:  1. Recent aortic dissection secondary to hypertension.  The patient      has had what appears to be a remarkable recovery.  At some point in      the future, he should probably have a baseline CT scan to evaluate      his repair.  I will have him see Dr. Dietrich Pates or Dr. Tenny Craw in 3      months for a baseline echocardiogram to see where his LV function      lies.  Looking through the chart, there is a question of a      subendocardial MI back in 2006, but most of the chart indicates      hypertensive cardiomyopathy, with previous EFs of 33% as far back      as 2006.  2. Cardiomyopathy.  Blood pressure under good  control.  Check      echocardiogram in 3 months.  3. Psychiatric disease.  Further to be elucidated at [facility].  The      patient had been on Depakote and Seroquel in the past.  He was sent      home on trazodone instead.  He seems to think that Dr. Juanetta Gosling is      his medical doctor, and hopefully this will be followed up by him.  4. Diabetes.  Currently on metformin 500 b.i.d.  Follow up hemoglobin      A1c and sugars at [facility] and with primary care doctor.   Overall, I think it is important to let the patient recover from his  type 1 aortic dissection, but at some point in the next 3 months he  should probably have a baseline CT scan and echocardiogram to further  assess his aortic repair and LV function.     Noralyn Pick. Eden Emms, MD, [facility]  Electronically Signed    PCN/MedQ  DD: 04/18/2007  DT: 04/19/2007  Job #: [id number]

## 2011-01-17 NOTE — Op Note (Signed)
NAME:  KEONTAY, VORA NO.:  [id number]   MEDICAL RECORD NO.:  [id number]          PATIENT TYPE:  INP   LOCATION:  2316                         FACILITY:  [facility]   PHYSICIAN:  Sheliah Plane, MD    DATE OF BIRTH:  May 05, 1956   DATE OF PROCEDURE:  03/20/2007  DATE OF DISCHARGE:                               OPERATIVE REPORT   PREOPERATIVE DIAGNOSIS:  Acute type-I aortic dissection.   POSTOPERATIVE DIAGNOSIS:  Acute type-I aortic dissection.   SURGICAL PROCEDURES:  Replacement of ascending aorta and resuspension of  aortic valve with circulatory arrest and cardiopulmonary bypass.   SURGEON:  Sheliah Plane, M.D.   FIRST ASSISTANT:  Kerin Perna, M.D.   BRIEF HISTORY:  The patient is a 55 year old male who presented with  acute aortic dissection, whose history is outlined in history and  physical note of the same day.  The patient was transferred urgently  from [facility] after he was found to have type-I aortic  dissection with pericardial tamponade and clotted pericardium.  When the  patient presented he was barely arousable and was not able to give full  consent.  His power of attorney was contacted who agreed over the phone  to proceed with surgery.  In addition, in this emergency situation is  was unlikely the patient would survive more than an hour or two without  proceeding immediately with surgery.  He was taken directly to the  operating room from the [facility] emergency room.  Lines were placed  and he was intubated.   DESCRIPTION OF PROCEDURE:  With Swan-Ganz arterial line monitors in  place, the patient underwent general endotracheal anesthesia without  incidence.  Transesophageal echo probe was placed and confirmed very  trace aortic insufficiency, dilated ascending aorta with obvious  proximal ascending aorta flap.  Skin of the chest and legs was prepped  with Betadine and draped in the usual sterile manner.  Before a median  sternotomy was carried out, the right groin was opened and the right  femoral artery was identified and encircled with vessel loops.  The  patient was systemically heparinized.  A 24 straight arterial cannula  was placed into the right common femoral artery for arterial perfusion.  A median sternotomy was performed.  Pericardium was opened.  There was a  significant amount of clotted blood and 200 to 300 mL of blood in the  pericardial sac.  The proximal aorta was dilated with obvious  dissection.  A right atriovenous cannula was placed.  The patient was  placed on cardiopulmonary bypass at 2.4 liters per minute per m2.  A  right superior pulmonary vein vent was placed.  The patient's body  temperature was then cooled to 18 degrees.  Barbituratess and steroids  were given.  The head was also packed in ice with a nasal temperature of  17.  A circulatory arrest was commenced.   The aorta was opened.  The tear was easily identified approximately 1 cm  above the sinotubular ridge extending down toward the attachments of the  aortic valve between the left and noncoronary  cusp.  The ascending aorta  was trimmed and removed.  Using BioGlue the false and true lumen was  reapproximated distally.  A side-arm 28-mm Hemashield graft was then  sewed with felt strips in a running 3-0 Prolene to the distal ascending  aorta.  BioGlue was placed on the anastomosis.  With the graft sewn in  place, the arterial perfusion was then shifted to the side port.  The  arch and graft was allowed to fill and de-air and aortic crossclamp was  placed on the ascending aorta graft.  Cardiopulmonary bypass was then  resumed with a total circulatory arrest time of 36 minutes.  The  proximal aorta was then carefully examined.  A rent posteriorly was  extended down to the aortic valve and was closed with a running 4-0  Prolene and this was also used to resuspend the aortic valve.  With felt  strip around the aorta, the  proximal anastomosis was then performed  after gluing the true and false lumen together.  The left and right  coronary ostia were easily identified and had no evidence of  intraluminal obstruction.  The proximal anastomosis was then carried out  with a running 3-0 Prolene suture.  With the anastomosis completed,  BioGlue was placed around the anastomosis.  An aortic root vent was  stuck into the ascending aortic graft with a pledgeted suture.  This was  used to de-air the heart.  Retrograde cardioplegia had been previously  given.  Additional retrograde warm cardioplegia was administered as the  ascending aorta and heart was allowed to de-air through the aortic root  vent.  The aortic crossclamp was then removed with a total crossclamp  time of 79 minutes.  The patient spontaneously converted to a sinus  rhythm.  Several areas of the anastomosis that were oozing were stitched  with pledgeted 4-0 Prolene sutures with the anastomosis appearing to be  free of significant bleeding but was still with diffuse coagulopathy.  The patient's body temperature was completely rewarmed to 37 degrees.  During the rewarming process the right femoral aortic cannula was  removed and the femoral artery repaired with a running 6-0 Prolene.  Blood flow was restored to the right leg.  With the patient rewarmed to  37 degrees, he was then ventilated.  The right superior pulmonary vein  vent was removed, and he was weaned from cardiopulmonary bypass.  He  remained hemodynamically stable.  The right atrial cannula was removed.  Protamine sulfate was administered.  Platelets and fresh frozen were  administered.  With the operative field relatively hemostatic, the side  arm of the ascending aortic graft was doubly ligated.  Atrial and  ventricular pacing wires were applied.  Two Blake drains, one posterior  one anterior were replaced in the pericardium.  The sternum was then  closed with #6 stainless steel wire,  fascia closed with interrupted 0  Vicryl, running 3-0 Vicryl in the subcutaneous tissue, and 4-0  subcuticular stitch in the skin edges.  Dry dressings were applied.  Sponge and needle count was reported as correct at the completion of the  procedure.  The right groin incision was also closed with a running 2-0  Dexon suture and 4-0 subcuticular stitch in the skin edges.  Dry  dressing was applied.  The patient was then transferred to the surgical  intensive care unit in critical condition.  The patient's power of  attorney was notified of the completion of the case.  Total pump time  was 155 minutes.      Sheliah Plane, MD  Electronically Signed     EG/MEDQ  D:  03/21/2007  T:  03/22/2007  Job:  [id number]   cc:   Ramon Dredge L. Juanetta Gosling, M.D.  Gerrit Friends. Dietrich Pates, MD, [facility]

## 2011-01-17 NOTE — Procedures (Signed)
NAME:  Derrick Butler, Derrick Butler              ACCOUNT NO.:  [id number]   MEDICAL RECORD NO.:  [id number]          PATIENT TYPE:  OUT   LOCATION:  RAD                           FACILITY:  [facility]   PHYSICIAN:  Gerrit Friends. Dietrich Pates, MD, FACCDATE OF BIRTH:  02/13/1956   DATE OF PROCEDURE:  DATE OF DISCHARGE:                                ECHOCARDIOGRAM   CLINICAL DATA:  A 55 year old gentleman with prior CABG surgery,  hypertension, diabetes and cardiomyopathy.  Aorta 4.6, left atrium 3.9,  septum 1.6, posterior wall 1.5, LV diastole 4.6, LV systole 3.5.  1. Technically suboptimal but adequate echocardiographic study.  2. Left atrial size at the upper limit of normal; normal right atrium.  3. Normal right ventricular size; borderline hypertrophy; normal      systolic function.  4. Trileaflet and normal aortic valve with very mild aortic      insufficiency.  5. Mild to moderate dilatation of the proximal descending aorta; mild      to moderate calcification of the wall.  The remainder of the aortic      arch and the descending aorta appeared normal.  6. Normal tricuspid valve.  7. Normal mitral valve; mild annular calcification.  8. Normal left ventricular size; moderate hypertrophy; normal to      hyperdynamic regional and global function with an estimated      ejection fraction of 0.70.  9. Normal IVC.      Gerrit Friends. Dietrich Pates, MD, [facility]  Electronically Signed     RMR/MEDQ  D:  07/29/2007  T:  07/29/2007  Job:  [id number]

## 2011-01-17 NOTE — Op Note (Signed)
NAME:  Derrick Butler, Derrick Butler              ACCOUNT NO.:  [id number]   MEDICAL RECORD NO.:  [id number]          PATIENT TYPE:  EMS   LOCATION:  ED                            FAC[facility]  [facility]   PHYSICIAN:  Howard E. Frederick, M.D.DATE OF BIRTH:  July 29, 1956   DATE OF PROCEDURE:  03/20/2007  DATE OF DISCHARGE:                               OPERATIVE REPORT   PROCEDURE PERFORMED:  Transesophageal echocardiography.   The patient was brought to the [facility] operating room in extreme  condition secondary to a presumptive diagnosis of a ruptured ascending  thoracic aneurysm.  The patient was brought to the operating room in  stable but sedated condition.  Due to the patient's inability to  cooperate, he was induced under general anesthetic.  His airway was  secured.   After inducing the patient, invasive monitors were placed without  difficulty and the transesophageal echocardiography probe was placed  without difficulty.  After arrival of technical support, we were able to  identify a dramatic pericardial effusion consistent with bloody  tamponade.   Further examination of the heart revealed a thickened left ventricle and  cross section.  This was consistent with the patient's history of  hypertensive cardiomyopathy, ejection fraction was estimated to be  approximately 30%.  Evaluation of the patient's mitral valve revealed a  trace amount of mitral regurgitation, otherwise a normal-appearing  valve.   Evaluation of the patient's aortic valve apparatus revealed a tricuspid  aortic valve but there was clear evidence of a intimal flap tear in the  perivalvular area, in the area immediately distal to the aortic valve  there was evidence that the blood flow was within the structure of the  true lumen of the aorta with probable retrograde blood present in the  false lumen as evidenced by Color Flow Doppler.   Examination of the ascending and descending aorta was done and the views  were taped.   Distal evaluation of the aorta revealed evidence to suggest  that the patient's true lumen was quite small and that the false lumen  was probably compromising blood flow to the patient's mesentery vessels.  View of the actual tear was not evident due to the inability to  visualize the arch of the aorta.   The remainder of the cardiac structures were given a cursory examination  and appeared grossly normal.  Due to extreme nature of the surgery and  the patient's condition, more thorough examination will be deferred at  this time.           ______________________________  Derrick Butler, M.D.     CEF/MEDQ  D:  03/20/2007  T:  03/21/2007  Job:  [id number]
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
## 2011-01-17 NOTE — Assessment & Plan Note (Signed)
[facility]                       [place] CARDIOLOGY OFFICE NOTE   MATHAYUS, STANBERY                     MRN:          [id number]  DATE:08/05/2007                            DOB:          1955-11-16    IDENTIFICATION:  Mr. Derrick Butler is a 55 year old gentleman who I saw back in  2002 for the last time.  He is status post aorta repair for a type 1  aortic dissection this summer.  He was last seen by Maurine Cane on  August 14, comes in for continued care.  He denies chest pain.  He walks  up and down the hall at [facility].  Denies shortness of breath, notes  occasional dizziness but not a lot, no syncope, no chest pain.   MEDICATIONS:  1. Aspirin 325 daily.  2. Labetalol 200 daily.  3. Metformin 500 b.i.d.  4. Protonix 40 daily.  5. Altace 10 b.i.d.  6. Haldol b.i.d.  7. Depakote 250 t.i.d.  8. Clonidine 0.3 daily.   PHYSICAL EXAMINATION:  Patient is in no distress.  Blood pressure is  98/70, on my check 96/60, pulse is 56, weight is 173 pounds.  LUNGS:  Clear to auscultation.  CARDIAC:  Regular rate and rhythm, S1-S2; no S3, S4 or murmurs.  ABDOMEN:  Benign.  EXTREMITIES:  No edema.   Echocardiogram was recently done that showed moderate LVH, normal LV  function with an LVF of 70%.   IMPRESSION:  1. Dissection of aorta, status post repair, has done amazingly well.      Would continue current regimen but I would back down on his      Ramipril a little bit to 10 daily.  He will continue his other      medications, the dosing is a bit off with the clonidine but he is      calm and seems to be tolerating.  2. Health care maintenance.  Will check a fasting lipid, BMET, TSH as      his convenience.  He should be on some lipid lowering agent given      his diabetes and vascular disease.  I will be in touch with him      with the results, otherwise continue current regimen.  Followup 4-5      months.  3. Hypertension, again, back down on  Ramipril, followup in 4-5 months.    Pricilla Riffle, MD, [facility]  Electronically Signed   PVR/MedQ  DD: 08/05/2007  DT: 08/05/2007  Job #: [id number]

## 2011-01-20 NOTE — H&P (Signed)
NAME:  Derrick Butler, Derrick Butler              ACCOUNT NO.:  [id number]   MEDICAL RECORD NO.:  [id number]          PATIENT TYPE:  INP   LOCATION:  4741                         FACILITY:  [facility]   PHYSICIAN:  Jesse Sans. Wall, M.D.   DATE OF BIRTH:  [birth year]   DATE OF ADMISSION:  08/31/2005  DATE OF DISCHARGE:                                HISTORY & PHYSICAL   CHIEF COMPLAINT:  Fainting spell in the doctor's office.   HISTORY OF PRESENT ILLNESS:  Derrick Butler is a 55 year old gentleman with a  history of hypertension and noninsulin-dependent diabetes mellitus who has  had a previous CVA in 2002.  He has hypertensive cardiomyopathy with an EF  of 33% with no ischemia with an adenosine Cardiolite January 2002.  A 2-D  echocardiogram confirmed an EF of 30% with global hypokinesia.  He has had  negative carotid Dopplers in the past.   Today, while visiting Dr. Juanetta Gosling, he had a syncopal event while sitting on  the examining table.  This was right after he had his blood drawn.  He felt  nauseated before he passed out.  He had no other symptoms including chest  pain, shortness of breath or palpitations.   He has had no exertional symptoms suggestive of ischemia.  He has also had  no history of ventricular tachycardia or other arrhythmias.   PAST MEDICAL HISTORY:   ALLERGIES:  He has no known drug allergies.   MEDICATIONS PRIOR TO ADMISSION:  (We think, but he does not have a list) -  1.  Toprol-XL 100 mg daily.  2.  Lotrel 10/20 daily.  3.  Benicar 40 mg daily.  4.  Lasix 40 mg a day.  5.  Aspirin and Metaglip 5/500 b.i.d.   The emergency room list at [facility] shows -  1.  Catapres TTS, unknown dose.  2.  Aspirin 325 daily.  3.  Seroquel 25 mg b.i.d.  4.  Plavix 75 mg a day.  5.  Toprol 50 mg a day.  6.  Metaglip 5/500 b.i.d.  7.  Lotensin 20 mg daily.  8.  Depakote 500 mg, unknown frequency.   MEDICAL HISTORY:  1.  His past medical history is also significant for psychosis.  2.  He  has had chronic renal insufficiency with a baseline creatinine of      1.9.  He had an MRI and MRA of his renals in January 2002.  No      definitive stenosis could be ruled out.  His abdominal aorta was normal.   SLEEP ARCHITECTURE:  He lives in [place] with his sister.  He has  no children.  He has never been married.  He is disabled.  He is a prior  worker in a [employer].   FAMILY HISTORY:  Remarkable for his mother who has Alzheimer's and currently  is at [employer].  His father had a stroke and a heart problem and is  deceased.   REVIEW OF SYSTEMS:  Unremarkable, other than in HPI.  He denies any blood  loss.   PHYSICAL EXAMINATION:  GENERAL:  He is in no acute distress.  Skin is warm  and dry.  He is very talkative.  VITAL SIGNS:  Blood pressure is 136/86 on IV nitroglycerin.  He was  hypertensive in the emergency room at [facility] and en route with [facility].  Systolics were running as high as 190 en route.  His pulse was 73 and  regular.  Saturation 99% on 2 liters.  Temperature is 97.5.  HEENT:  Normocephalic and atraumatic, except for a few scars.  Pupils equal,  round and reactive to light.  Extraocular movement are intact.  Sclerae are  clear.  He has an upper plate with 5 false teeth.  NECK:  Supple.  He has no JVD.  Carotid upstrokes are equal bilaterally  without bruits.  There is no lymphadenopathy.  CARDIOVASCULAR:  Normal PMI.  He has a 1/6 systolic murmur at the left  sternal border.  There is no gallop, there is no rub.  LUNGS:  Clear.  SKIN:  No rashes or lesions.  ABDOMEN:  Soft with good bowel sounds.  There is no tenderness or midline  bruit.  There is no organomegaly.  EXTREMITIES:  No clubbing, cyanosis, or edema.  Pulses are 2+/4+, dorsalis  pedis and posterior tibials bilaterally.  MUSCULOSKELETAL:  Grossly intact.  NEUROLOGIC:  Grossly intact.   CHEST X-RAY:  Chest x-ray is pending.   EKG:  EKG shows sinus rhythm with ST segment  changes consistent with LVH  with strain versus ischemia.   LABORATORY DATA:  Hemoglobin 16.3, platelets 156, white count 9.9, potassium  3.9, creatinine 1.6, BUN 28, sugar 148.  His calcium was 9.3.  BNP was 51.9.  CPK MB is negative.  Troponin is positive the 0.77.   ASSESSMENT AND PLAN:  The patient presents with what sounds like vasovagal  syncope; however, his troponin is mildly positive at 0.77.  CK-MB is  negative.  With his chronic renal insufficiency, we need to rule out chronic  elevation or troponin.  We also need to rule out an acute coronary syndrome.   PLAN:  1  Continue if nitroglycerin and heparin.  1.  Continue beta blocker and aspirin.  2.  2-D echocardiogram to assess left ventricular function.  3.  Cardiac enzymes.  If we see a trend upward in the troponin or positive      CPK MB, I have recommended cardiac catheterization to the patient.      Indications, risks, potential benefits were discussed.  He agreed with      the plan.  Will not do a V Gram __________ dye.  Will obtain a 2-D      echocardiogram to assess left ventricular function.  4.  Hold Metaglip.  5.  Hydrate with normal saline prior to catheterization, and careful not to      volume overload with his LV dysfunction.  6.  He will be kept on telemetry without any arrhythmias.      Thomas C. Wall, M.D.  Electronically Signed     TCW/MEDQ  D:  08/31/2005  T:  09/01/2005  Job:  [id number]   cc:   Ramon Dredge L. Juanetta Gosling, M.D.  Fax: [phone]   [place] Bing, M.D. [facility]  [address]  [address]  [place]  [place] [address]   Pricilla Riffle, M.D.  [address]  [address]  [place]  [place] [address]

## 2011-01-20 NOTE — Discharge Summary (Signed)
NAME:  Derrick, Butler NO.:  [id number]   MEDICAL RECORD NO.:  [id number]          PATIENT TYPE:  IPS   LOCATION:  0404                          FACILITY:  [facility]   PHYSICIAN:  Anselm Jungling, MD  DATE OF BIRTH:  [birth year]   DATE OF ADMISSION:  06/26/2007  DATE OF DISCHARGE:  07/02/2007                               DISCHARGE SUMMARY   IDENTIFYING DATA/REASON FOR ADMISSION:  This was an inpatient  psychiatric admission for Derrick Butler, a 55 year old single white male who  had been living in an assisted-living facility.  He had a history of  mental illness.  He was referred for treatment because of increasingly  intrusive and hyperreligious behaviors within his assisted-living  facility.  Please refer to the admission note for further details  pertaining to the symptoms, circumstances and history that led to his  hospitalization.   INITIAL DIAGNOSTIC IMPRESSION:  He was given initial AXIS I diagnosis of  psychosis not otherwise specified.   MEDICAL/LABORATORY:  The patient was seen at [facility]  and medically cleared there prior to transfer to our inpatient  psychiatric service.  After arrival, he was medically and physically  reassessed by the psychiatric nurse practitioner.  He came to [place] with a  medical history that included hypertension, diabetes mellitus, GERD, as  well as a history of aneurysm.  During his stay, he was continued on his  usual medications including clonidine patch, Altace, Protonix,  Glucophage, Normodyne, aspirin, and Colace.  There were no acute medical  issues during his stay.   HOSPITAL COURSE:  The patient was admitted to the adult inpatient  psychiatric service.  He presented as a well-nourished, normally-  developed adult male who was generally pleasant, cooperative, and  nonirritable.  There were no overt signs or symptoms of psychosis,  delusionality, or responses to internal stimuli.  He had come to [place] on a  regimen of Haldol 5 mg b.i.d.  It was judged that this was a higher dose  than necessary, as he was showing some signs of excess sedation.  Haldol  was subsequently reduced to 1 mg b.i.d.  To address the reported  irritability and hyperreligious and hyperactive behavior that had been  described prior to admission, he was placed on a trial of Depakote up to  250 mg t.i.d., which was well-tolerated.  The patient remained pleasant,  cooperative and nonirritable throughout his stay.   He was initially seen and evaluated by Dr. Geoffery Lyons.  The undersigned  assumed the patient's care on the fifth hospital day.  On that date, I  met with the patient, reviewed the chart, and discussed the patient's  case with the treatment staff.  By this time, we had learned that his  group home was open to allowing him to return.  By the eighth hospital  day, it appeared that the patient was no longer in need of inpatient  treatment.  He was pleasant, easily engageable in a one-to-one session,  and made no delusional statements.  He was alert and fully oriented.  He  indicated he felt the  addition of Depakote had been positive, although  he had difficulty explaining how or why.  In general, he stated that he  thought it was good for his nerves.  He reported his sleep was  improved and his appetite was good.  He was very open to returning to  [facility].   AFTERCARE:  The patient was to follow up at [facility] for appointment with Dr. Nedra Hai on July 15, 2007.  He was also  to follow up with Dr. Hurley Cisco for psychiatric appointment on August 07, 2007 at [facility].   DISCHARGE MEDICATIONS:  1. Clonidine 0.3 mg TTS patch weekly, next due July 04, 2007.  2. Colace 100 mg b.i.d.  3. Altace 10 mg b.i.d.  4. Protonix 40 mg daily.  5. Glucophage 500 mg b.i.d.  6. Normodyne 200 mg daily.  7. Dulcolax 10 mg daily.  8. Aspirin 325 mg  daily.  9. Haldol 1 mg b.i.d.  10.Depakote 250 mg t.i.d.   DISCHARGE DIAGNOSES:  AXIS I:  Schizoaffective disorder not otherwise  specified.  AXIS II:  Deferred.  AXIS III:  History of hypertension, aneurysm, gastroesophageal reflux  disease, diabetes mellitus.  AXIS IV:  Stressors:  Severe.  AXIS V:  GAF on discharge 55.      Anselm Jungling, MD  Electronically Signed     SPB/MEDQ  D:  07/03/2007  T:  07/03/2007  Job:  [id number]

## 2011-01-20 NOTE — Discharge Summary (Signed)
[place]. [facility]  Patient:    Derrick Butler, Derrick Butler                       MRN: [id number] Adm. Date:  [id number] Disc. Date: [id number] Attending:  Nelta Numbers Dictator:   Abelino Derrick, P.A.C. [facility] CC:         Dr. Esmeralda Arthur  Dr. Ellison Carwin   Referring Physician Discharge [place]  HISTORY OF PRESENT ILLNESS:  Derrick Butler is a 55 year old male seen September 27, 2000 at [facility] by Eliezer Champagne and Dr. Dietrich Pates for malignant hypertension.  Head CT showed evidence of previous CVAs.  He also had renal insufficiency with a creatinine of 1.9.  He was admitted to telemetry, echocardiogram was obtained.  He was transferred to [facility] for further evaluation.  He was seen in consult at [facility] by Dr. Sharene Skeans.  He was started on IV heparin.  Patient was treated for his hypertension with clonidine and beta blocker.  Echocardiogram was obtained and showed moderate LV dysfunction. He was seen in consult by the stroke team.  His blood pressure improved.  The plan is for long-term aspirin and Plavix and his heparin was discontinued. Patient had problems with psychosis during this hospitalization.  He became agitated and there were reports from the nursing staff of possible suicidal ideation.  He underwent a Cardiolite study done October 03, 2000 which showed no ischemia.  Patient was seen in consult by the psychiatric service, Dr. Jeanie Sewer.  Depakote was recommended.  Patient was ultimately discharged October 04, 2000.  DISCHARGE DIAGNOSES: 1. Hypertension, under better control at discharge. 2. History of prior cerebrovascular accidents with an acute left subcortical    infarct. 3. Transient renal insufficiency. 4. Confusion and psychosis, seen by Dr. Adelene Amas. [place] this admission. 5. Cardiomyopathy with an ejection fraction of 30%, negative Cardiolite this    admission.  PLAN:  The patient is to be discharged on medical therapy and follow up  with outpatient psychiatric care and with his family doctor in [place].  He did not tolerate OT or PT well.  DISCHARGE MEDICATIONS: 1. Depakote 500 mg h.s. 2. Seroquel 25 mg twice a day. 3. Coated aspirin q.d. 4. Plavix 75 mg a day. 5. Catapres-TTS-3 patch every week. 6. Toprol-XL 50 mg a day. 7. Norvasc 10 mg a day. 8. Lotensin 20 mg a day. 9. Hygroton 12.5 q.d.  LABORATORY DATA:  Echocardiogram showed overall LV function to be moderately decreased with an EF estimated at 30%.  There was diffuse wall motion depression.  Carotid artery Dopplers showed no significant plaque.  CT of the head without contrast showed bilateral old lacunar strokes.  MRI revealed advanced intracranial atherosclerotic disease.  Discharge laboratory:  White count 4.8, hemoglobin 13.3, hematocrit 38.7, platelets 129.  Sodium 136, potassium 4.3, BUN 30, creatinine 1.9. Homocysteine level 11.39.  Lipid profile showed a cholesterol of 167, HDL 48, LDL 97.  TSH 1.32.  Aldosterone level was 4.8.  Drug screen was negative except for opiates, which were positive.  DISPOSITION:  Patient is discharged in stable condition.  FOLLOW-UP:  With Dr. Moss Mc office.  He will contact Dr. Langston Masker office in [place] for further follow-up with him. DD:  10/22/00 TD:  10/22/00 Job: [id number]

## 2011-01-20 NOTE — Discharge Summary (Signed)
NAME:  Derrick Butler, Derrick Butler              ACCOUNT NO.:  [id number]   MEDICAL RECORD NO.:  [id number]          PATIENT TYPE:  INP   LOCATION:  4741                         FACILITY:  [facility]   PHYSICIAN:  Jonelle Sidle, M.D. LHCDATE OF BIRTH:  [birth year]   DATE OF ADMISSION:  08/31/2005  DATE OF DISCHARGE:  09/03/2005                                 DISCHARGE SUMMARY   PRINCIPAL DIAGNOSIS:  Vasovagal syncope.   OTHER DIAGNOSES:  1.  Presumed non-ST-elevation myocardial infarction with elevated troponin.  2.  Acute psychosis.  3.  Chronic renal insufficiency.  4.  Hypertension.  5.  Type 2 diabetes mellitus.  6.  Hypertensive cardiomyopathy with previously-documented EF of 33%.   ALLERGIES:  No known drug allergies.   PROCEDURES:  None.   HISTORY OF PRESENT ILLNESS:  This is a 55 year old white male with a prior  history of hypertension, diabetes, and hypertensive cardiomyopathy, who was  seen by Dr. Juanetta Butler in the outpatient clinic on August 31, 2005, and right  after having a venipuncture blood draw, the patient became nauseated and  experienced a syncopal event.  He was sent to [facility] for further  evaluation, and was noted to have an elevated troponin of 0.77 with normal  CK-MB.  A decision was made at that point to transfer him to [facility] for  further evaluation.   HOSPITAL COURSE:  Derrick Butler troponin continued to elevate to a peak of  1.47 with normal CKs and MBs (peak of 117 and 2.3, respectively).  Because  of elevated troponin with previously documented poor LV function, a decision  was made to pursue left heart cardiac catheterization.  Prior to this, he  underwent a 2D echocardiogram on September 01, 2005, which revealed an EF of  50-55% with basal inferoposterior wall and apical hypokinesis and mild to  moderately-thickened left ventricle.  This was an improvement from the last  echocardiogram in January 2002.  On the afternoon of September 01, 2005,  prior to  cardiac catheterization, the patient became quite upset and irate  with documented paranoia, flight of ideas, and suicidal ideation.  He was  felt to have impaired thought processes, and catheterization was cancelled.  Psychiatry was consulted and felt that the patient was experiencing no  imminent dangerous ideation, and, in fact, it may be countertherapeutic to  keep the patient in the hospital.  Psychiatry recommended increasing the  Seroquel dose, as well as consideration for increasing his Depakote in the  future.  They also recommended outpatient psychiatry followup, which the  patient has refused.  Derrick Butler has been ambulating in the hallways without  any complaints of chest discomfort.  He has not had any recurrent vasovagal  syncope, and any decision for further ischemic evaluation is on hold  secondary to his psychiatric status.  He is being discharged home today in a  satisfactory condition.   DISCHARGE LABS:  Hemoglobin 14.8, hematocrit 41.0, WBC 16.2, platelets  126,000, MCV 87.2, sodium 136, potassium 4.0, chloride 103, CO2 25, BUN 30,  creatinine 1.6, glucose 222, total bilirubin 1.7, alkaline phosphatase 90,  AST 24, ALT 17, albumin 3.7, peak CK 117, peak MB 2.3, peak troponin I 1.47.  Total cholesterol 183, triglycerides 252, HDL 34, LDL 99, calcium 9.2.  D-  dimer 0.23.  TSH 1.484.  Hemoglobin A1c 5.9.  Admission BNP 51.9.   DISPOSITION:  The patient is being discharged home today in good condition.   FOLLOW-UP PLANS/APPOINTMENTS:  He will be contacted by [facility] at  [facility] to follow up with Dr. Dietrich Pates in approximately two weeks.  He is  asked to continue followup with his primary care physician, Dr. Juanetta Butler, in  one to two weeks.  He is also asked to establish psychiatry care in the  outpatient setting.   DISCHARGE MEDICATIONS:  1.  Aspirin 325 mg daily.  2.  Plavix 75 mg daily.  3.  Toprol-XL 50 mg daily.  4.  Seroquel 25 mg in the a.m. and 50  mg in the p.m.  5.  Depakote 500 mg daily.  6.  __________5/500 mg b.i.d.  7.  Nitroglycerin 0.4 mg sublingual p.r.n. for chest pain.  8.  Lotensin 20 mg daily.   OUTSTANDING LABS/STUDIES:  None.   DURATION OF DISCHARGE ENCOUNTER:  40 minutes including physician time.      Ok Anis, NP    ______________________________  Jonelle Sidle, M.D. [facility]    CRB/MEDQ  D:  09/03/2005  T:  09/04/2005  Job:  [id number]   cc:   Ramon Dredge L. Juanetta Butler, M.D.  Fax: [phone]   [place] Bing, M.D. [facility]  [address]  [address]  [place]  [place] [address]

## 2011-01-20 NOTE — Consult Note (Signed)
[name]. [facility]  Patient:    Derrick Butler, Derrick Butler                       MRN: [id number] Proc. Date: 09/27/00 Adm. Date:  [id number] Attending:  Nelta Numbers CC:         Gerrit Friends. Dietrich Pates, M.D. [facility]   Consultation Report  DATE OF BIRTH:  1956/04/14  BRIEF HISTORY:  I was asked to see this 55 year old right-handed gentleman who was transferred from [facility] with malignant hypertension and slurred speech.  The patient was admitted to [facility] on September 26, 2000, with a blood pressure of 250/180.  He seemed somewhat confused and had slurred speech.  CT scan of the brain showed evidence of an ill defined low density in the right frontal parietal region in a subcortical location suggesting a more recent infarction.  The patient had elevation of creatinine diminished potassium, complained of a headache, and was placed on a Nipride drip with marked improvement in his blood pressure but no changes in his neurologic exam.  The patient was transferred to [facility] for further evaluation as part of this neurologic evaluation.  REVIEW OF SYSTEMS:  The patient denies significant medical problems related to angina, chest pain, palpitations.  He has not had nausea, vomiting or diarrhea, melena, hematochezia, dysuria, hematuria, infections of his head and neck, asthma, bronchitis, pneumonia, diabetes or thyroid disease, anemia, bruisability, lymphadenopathy, diseases of his skin, allergies, or immune disorders.  He has not had other neurologic problems except as noted above. This would include migraines, seizures or other paroxysmal disorders.  REVIEW OF SYSTEMS:  Otherwise negative.  FAMILY HISTORY:  Mother is alive at age 65, father died of stroke, and cardiovascular complications in his 66s.  MEDICATIONS: 1. Nipride drip. 2. K-Dur 20 mEq twice a day. 3. Head cold medicine used last week. 4. The patient uses  occasional Goody powders.  ALLERGIES:  None known.  SOCIAL HISTORY:  The patient lives in [place].  He lives with his mother.  He does not use tobacco or alcohol.  He works for ______ [employer].  PHYSICAL EXAMINATION:  GENERAL:  This is a pleasant almost giddy right-handed gentleman in no distress.  VITAL SIGNS:  Temperature 98.3.  Blood pressure 178/104, resting pulse 76, respirations 24.  Pulse oximetry 98%.  LUNGS:  Clear.  HEART:  No murmurs.  PULSES:  Normal.  ABDOMEN:  Soft.  Bowel sounds normal.  EXTREMITIES:  No edema or cyanosis.  NEUROLOGIC:  Mental status awake, very dysarthric at times.  Unintelligible. Cranial nerves round reactive pupils.  Fundi normal.  Visual fields full to double simultaneous stimuli.  Okay in responses and equal.  Symmetrical facial strength.  Poor elevation of palate and uvula.  Air conduction greater than bone conduction bilaterally.  Motor examination:  Normal strength, tone and mass.  Good fine motor movements.  No pronator drift.  Sensation intact to primary and cortical modalities.  Cerebellar good finger-to-nose when offered repetitive movements. Gait was not tested.  Gait was not tested.  Deep tendon reflexes were absent in the lower extremities.  Diminished in the upper extremities.  He had bilateral atrial fibrillation flexor plantar responses.  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  Specifically there are two lesions in the claustrum and one in the external capsule on the right.  On the left there is one in the external capsule  extending down into the superior putamen.  There are also a total of 4 more saddle lesions in the parietal region that are paired right more prominent than left that are low density and appear to stand out from the great white matter.  These may represent embolic infarctions in the great white matter junction.  PLAN: 1. MRI scan, MRA to sort this  out. 2. 2-D echocardiogram to look for embolic sources in the heart. 3. Carotid Doppler to look for embolic sources in the neck. 4. IV heparin until the patients is cleared.  The MRI/MRA will also allow [place] to look for the pattern of malignant hypertension which usually causes increased signal in the posterior circulation.  The patient needs speech therapy for assistance with articulation and also with evaluation of his dysphagia.  The patient needs a fasting lipid and homocystine in the morning.  I appreciate to see the patient.  If you have questions or I can be of assistance do not hesitate to contact me. DD:  09/27/00 TD:  09/28/00 Job: [id number]

## 2011-06-14 LAB — RAPID URINE DRUG SCREEN, HOSP PERFORMED
Amphetamines: NOT DETECTED
Barbiturates: NOT DETECTED
Benzodiazepines: POSITIVE — AB
Cocaine: NOT DETECTED
Opiates: NOT DETECTED
Tetrahydrocannabinol: NOT DETECTED

## 2011-06-14 LAB — COMPREHENSIVE METABOLIC PANEL
ALT: 8
ALT: 10
AST: 13
AST: 11
Albumin: 3.2 — ABNORMAL LOW
Albumin: 3.5
Alkaline Phosphatase: 55
Alkaline Phosphatase: 59
BUN: 17
BUN: 20
CO2: 27
CO2: 27
Calcium: 8.8
Calcium: 9.3
Chloride: 108
Chloride: 107
Creatinine, Ser: 1.47
Creatinine, Ser: 1.6 — ABNORMAL HIGH
GFR calc Af Amer: >60
GFR calc Af Amer: 56 — ABNORMAL LOW
GFR calc non Af Amer: 51 — ABNORMAL LOW
GFR calc non Af Amer: 46 — ABNORMAL LOW
Glucose, Bld: 110 — ABNORMAL HIGH
Glucose, Bld: 135 — ABNORMAL HIGH
Potassium: 4.1
Potassium: 4
Sodium: 146 — ABNORMAL HIGH
Sodium: 141
Total Bilirubin: 0.7
Total Bilirubin: 0.8
Total Protein: 5.6 — ABNORMAL LOW
Total Protein: 6.4

## 2011-06-14 LAB — CBC
HCT: 32.9 — ABNORMAL LOW
Hemoglobin: 11.4 — ABNORMAL LOW
MCHC: 34.7
MCV: 86.4
Platelets: 159
RBC: 3.82 — ABNORMAL LOW
RDW: 13.6
WBC: 4.4

## 2011-06-14 LAB — TSH: TSH: 1.023

## 2011-06-14 LAB — URINALYSIS, ROUTINE W REFLEX MICROSCOPIC
Bilirubin Urine: NEGATIVE
Glucose, UA: NEGATIVE
Ketones, ur: NEGATIVE
Nitrite: POSITIVE — AB
Protein, ur: 100 — AB
Specific Gravity, Urine: 1.016
Urobilinogen, UA: 0.2
pH: 6

## 2011-06-14 LAB — DIFFERENTIAL
Basophils Absolute: 0
Basophils Relative: 1
Eosinophils Absolute: 0.1
Eosinophils Relative: 2
Lymphocytes Relative: 36
Lymphs Abs: 1.6
Monocytes Absolute: 0.4
Monocytes Relative: 10
Neutro Abs: 2.3
Neutrophils Relative %: 52

## 2011-06-14 LAB — VITAMIN B12: Vitamin B-12: 481 (ref 211–911)

## 2011-06-14 LAB — URINE MICROSCOPIC-ADD ON

## 2011-06-14 LAB — FOLATE: Folate: 9.4

## 2011-06-14 LAB — ETHANOL: Alcohol, Ethyl (B): <5

## 2011-06-19 LAB — POCT CARDIAC MARKERS
CKMB, poc: 1
CKMB, poc: <1 — ABNORMAL LOW
CKMB, poc: 1
CKMB, poc: 1.6
Myoglobin, poc: 173
Myoglobin, poc: 187
Myoglobin, poc: 76.8
Myoglobin, poc: 84
Operator id: 179121
Operator id: 179121
Operator id: 208461
Operator id: 215651
Troponin i, poc: <0.05
Troponin i, poc: <0.05
Troponin i, poc: <0.05
Troponin i, poc: <0.05

## 2011-06-19 LAB — D-DIMER, QUANTITATIVE (NOT AT ARMC)
D-Dimer, Quant: 3.9 — ABNORMAL HIGH
D-Dimer, Quant: 4.41 — ABNORMAL HIGH

## 2011-06-19 LAB — POCT I-STAT 3, ART BLOOD GAS (G3+)
Acid-Base Excess: 6 — ABNORMAL HIGH
Acid-Base Excess: 6 — ABNORMAL HIGH
Acid-base deficit: 10 — ABNORMAL HIGH
Acid-base deficit: 2
Acid-base deficit: 6 — ABNORMAL HIGH
Bicarbonate: 18.8 — ABNORMAL LOW
Bicarbonate: 20.3
Bicarbonate: 24.3 — ABNORMAL HIGH
Bicarbonate: 24.3 — ABNORMAL HIGH
Bicarbonate: 25.4 — ABNORMAL HIGH
Bicarbonate: 29 — ABNORMAL HIGH
Bicarbonate: 29.1 — ABNORMAL HIGH
O2 Saturation: 100
O2 Saturation: 100
O2 Saturation: 100
O2 Saturation: 96
O2 Saturation: 97
O2 Saturation: 98
O2 Saturation: 99
Operator id: 117071
Operator id: 117071
Operator id: 196291
Operator id: 274841
Operator id: 3342
Operator id: 3342
Operator id: 3402
Patient temperature: 37.4
Patient temperature: 37.6
TCO2: 20
TCO2: 22
TCO2: 25
TCO2: 26
TCO2: 27
TCO2: 30
TCO2: 30
pCO2 arterial: 34.8 — ABNORMAL LOW
pCO2 arterial: 36
pCO2 arterial: 36.9
pCO2 arterial: 41.8
pCO2 arterial: 42.9
pCO2 arterial: 51.9 — ABNORMAL HIGH
pCO2 arterial: 52.3 — ABNORMAL HIGH
pH, Arterial: 7.166 — CL
pH, Arterial: 7.275 — ABNORMAL LOW
pH, Arterial: 7.283 — ABNORMAL LOW
pH, Arterial: 7.391
pH, Arterial: 7.427
pH, Arterial: 7.517 — ABNORMAL HIGH
pH, Arterial: 7.531 — ABNORMAL HIGH
pO2, Arterial: 184 — ABNORMAL HIGH
pO2, Arterial: 329 — ABNORMAL HIGH
pO2, Arterial: 440 — ABNORMAL HIGH
pO2, Arterial: 505 — ABNORMAL HIGH
pO2, Arterial: 79 — ABNORMAL LOW
pO2, Arterial: 86
pO2, Arterial: 90

## 2011-06-19 LAB — CBC
HCT: 33.1 — ABNORMAL LOW
HCT: 19.7 — ABNORMAL LOW
HCT: 23.1 — ABNORMAL LOW
HCT: 25.1 — ABNORMAL LOW
HCT: 26.8 — ABNORMAL LOW
HCT: 27.1 — ABNORMAL LOW
HCT: 27.2 — ABNORMAL LOW
HCT: 27.6 — ABNORMAL LOW
HCT: 28.2 — ABNORMAL LOW
HCT: 28.8 — ABNORMAL LOW
HCT: 29.2 — ABNORMAL LOW
HCT: 39.5
Hemoglobin: 11.3 — ABNORMAL LOW
Hemoglobin: 10.2 — ABNORMAL LOW
Hemoglobin: 14
Hemoglobin: 6.9 — CL
Hemoglobin: 8 — ABNORMAL LOW
Hemoglobin: 8.5 — ABNORMAL LOW
Hemoglobin: 9.2 — ABNORMAL LOW
Hemoglobin: 9.3 — ABNORMAL LOW
Hemoglobin: 9.3 — ABNORMAL LOW
Hemoglobin: 9.5 — ABNORMAL LOW
Hemoglobin: 9.7 — ABNORMAL LOW
Hemoglobin: 9.8 — ABNORMAL LOW
MCHC: 34
MCHC: 34
MCHC: 34.1
MCHC: 34.2
MCHC: 34.2
MCHC: 34.4
MCHC: 34.4
MCHC: 34.5
MCHC: 34.7
MCHC: 34.7
MCHC: 34.9
MCHC: 35.4
MCV: 86.7
MCV: 86.7
MCV: 86.8
MCV: 86.8
MCV: 86.9
MCV: 88.1
MCV: 88.1
MCV: 88.4
MCV: 88.5
MCV: 88.5
MCV: 88.7
MCV: 90.4
Platelets: 217
Platelets: 102 — ABNORMAL LOW
Platelets: 106 — ABNORMAL LOW
Platelets: 107 — ABNORMAL LOW
Platelets: 116 — ABNORMAL LOW
Platelets: 120 — ABNORMAL LOW
Platelets: 131 — ABNORMAL LOW
Platelets: 146 — ABNORMAL LOW
Platelets: 83 — ABNORMAL LOW
Platelets: 88 — ABNORMAL LOW
Platelets: 97 — ABNORMAL LOW
Platelets: 99 — ABNORMAL LOW
RBC: 3.82 — ABNORMAL LOW
RBC: 2.27 — ABNORMAL LOW
RBC: 2.66 — ABNORMAL LOW
RBC: 2.84 — ABNORMAL LOW
RBC: 3.04 — ABNORMAL LOW
RBC: 3.07 — ABNORMAL LOW
RBC: 3.07 — ABNORMAL LOW
RBC: 3.18 — ABNORMAL LOW
RBC: 3.2 — ABNORMAL LOW
RBC: 3.25 — ABNORMAL LOW
RBC: 3.37 — ABNORMAL LOW
RBC: 4.37
RDW: 14.6 — ABNORMAL HIGH
RDW: 12.7
RDW: 15.6 — ABNORMAL HIGH
RDW: 15.8 — ABNORMAL HIGH
RDW: 16 — ABNORMAL HIGH
RDW: 16 — ABNORMAL HIGH
RDW: 16.2 — ABNORMAL HIGH
RDW: 16.3 — ABNORMAL HIGH
RDW: 16.4 — ABNORMAL HIGH
RDW: 16.5 — ABNORMAL HIGH
RDW: 16.6 — ABNORMAL HIGH
RDW: 16.9 — ABNORMAL HIGH
WBC: 5.7
WBC: 10
WBC: 10.9 — ABNORMAL HIGH
WBC: 11.6 — ABNORMAL HIGH
WBC: 5.1
WBC: 5.4
WBC: 6.3
WBC: 6.4
WBC: 7.5
WBC: 7.6
WBC: 8.8
WBC: 9.7

## 2011-06-19 LAB — PREPARE PLATELET PHERESIS

## 2011-06-19 LAB — POCT I-STAT 4, (NA,K, GLUC, HGB,HCT)
Glucose, Bld: 184 — ABNORMAL HIGH
Glucose, Bld: 224 — ABNORMAL HIGH
Glucose, Bld: 254 — ABNORMAL HIGH
Glucose, Bld: 277 — ABNORMAL HIGH
Glucose, Bld: 278 — ABNORMAL HIGH
Glucose, Bld: 279 — ABNORMAL HIGH
Glucose, Bld: 289 — ABNORMAL HIGH
Glucose, Bld: 364 — ABNORMAL HIGH
Glucose, Bld: 367 — ABNORMAL HIGH
HCT: 20 — ABNORMAL LOW
HCT: 21 — ABNORMAL LOW
HCT: 21 — ABNORMAL LOW
HCT: 23 — ABNORMAL LOW
HCT: 23 — ABNORMAL LOW
HCT: 25 — ABNORMAL LOW
HCT: 30 — ABNORMAL LOW
HCT: 31 — ABNORMAL LOW
HCT: 35 — ABNORMAL LOW
Hemoglobin: 10.2 — ABNORMAL LOW
Hemoglobin: 10.5 — ABNORMAL LOW
Hemoglobin: 11.9 — ABNORMAL LOW
Hemoglobin: 6.8 — CL
Hemoglobin: 7.1 — CL
Hemoglobin: 7.1 — CL
Hemoglobin: 7.8 — CL
Hemoglobin: 7.8 — CL
Hemoglobin: 8.5 — ABNORMAL LOW
Operator id: 117071
Operator id: 117071
Operator id: 3342
Operator id: 3342
Operator id: 3342
Operator id: 3342
Operator id: 3342
Operator id: 3342
Operator id: 3402
Potassium: 3.6
Potassium: 3.6
Potassium: 3.7
Potassium: 3.7
Potassium: 3.7
Potassium: 5
Potassium: 5.5 — ABNORMAL HIGH
Potassium: 5.7 — ABNORMAL HIGH
Potassium: 6.4
Sodium: 129 — ABNORMAL LOW
Sodium: 135
Sodium: 136
Sodium: 138
Sodium: 138
Sodium: 139
Sodium: 142
Sodium: 143
Sodium: 146 — ABNORMAL HIGH

## 2011-06-19 LAB — MAGNESIUM
Magnesium: 2.1
Magnesium: 2.3

## 2011-06-19 LAB — POCT I-STAT 3, VENOUS BLOOD GAS (G3P V)
Acid-base deficit: 4 — ABNORMAL HIGH
Acid-base deficit: 9 — ABNORMAL HIGH
Bicarbonate: 19.1 — ABNORMAL LOW
Bicarbonate: 22.9
O2 Saturation: 100
O2 Saturation: 85
Operator id: 3342
Operator id: 3342
TCO2: 21
TCO2: 25
pCO2, Ven: 51.2 — ABNORMAL HIGH
pCO2, Ven: 53.8 — ABNORMAL HIGH
pH, Ven: 7.179 — CL
pH, Ven: 7.237 — ABNORMAL LOW
pO2, Ven: 395 — ABNORMAL HIGH
pO2, Ven: 60 — ABNORMAL HIGH

## 2011-06-19 LAB — BASIC METABOLIC PANEL
BUN: 40 — ABNORMAL HIGH
BUN: 24 — ABNORMAL HIGH
BUN: 28 — ABNORMAL HIGH
BUN: 29 — ABNORMAL HIGH
BUN: 29 — ABNORMAL HIGH
BUN: 29 — ABNORMAL HIGH
BUN: 31 — ABNORMAL HIGH
BUN: 31 — ABNORMAL HIGH
BUN: 33 — ABNORMAL HIGH
BUN: 35 — ABNORMAL HIGH
CO2: 21
CO2: 22
CO2: 27
CO2: 28
CO2: 28
CO2: 29
CO2: 29
CO2: 32
CO2: 32
CO2: 33 — ABNORMAL HIGH
Calcium: 9.1
Calcium: 7.9 — ABNORMAL LOW
Calcium: 8 — ABNORMAL LOW
Calcium: 8 — ABNORMAL LOW
Calcium: 8.1 — ABNORMAL LOW
Calcium: 8.3 — ABNORMAL LOW
Calcium: 8.5
Calcium: 8.6
Calcium: 8.6
Calcium: 8.6
Chloride: 107
Chloride: 100
Chloride: 102
Chloride: 104
Chloride: 105
Chloride: 105
Chloride: 105
Chloride: 106
Chloride: 112
Chloride: 99
Creatinine, Ser: 2.1 — ABNORMAL HIGH
Creatinine, Ser: 1.29
Creatinine, Ser: 1.31
Creatinine, Ser: 1.33
Creatinine, Ser: 1.41
Creatinine, Ser: 1.44
Creatinine, Ser: 1.58 — ABNORMAL HIGH
Creatinine, Ser: 1.59 — ABNORMAL HIGH
Creatinine, Ser: 1.86 — ABNORMAL HIGH
Creatinine, Ser: 1.87 — ABNORMAL HIGH
GFR calc Af Amer: 41 — ABNORMAL LOW
GFR calc Af Amer: 46 — ABNORMAL LOW
GFR calc Af Amer: 47 — ABNORMAL LOW
GFR calc Af Amer: 56 — ABNORMAL LOW
GFR calc Af Amer: 56 — ABNORMAL LOW
GFR calc Af Amer: >60
GFR calc Af Amer: >60
GFR calc Af Amer: >60
GFR calc Af Amer: >60
GFR calc Af Amer: >60
GFR calc non Af Amer: 34 — ABNORMAL LOW
GFR calc non Af Amer: 38 — ABNORMAL LOW
GFR calc non Af Amer: 39 — ABNORMAL LOW
GFR calc non Af Amer: 46 — ABNORMAL LOW
GFR calc non Af Amer: 47 — ABNORMAL LOW
GFR calc non Af Amer: 52 — ABNORMAL LOW
GFR calc non Af Amer: 53 — ABNORMAL LOW
GFR calc non Af Amer: 57 — ABNORMAL LOW
GFR calc non Af Amer: 58 — ABNORMAL LOW
GFR calc non Af Amer: 59 — ABNORMAL LOW
Glucose, Bld: 149 — ABNORMAL HIGH
Glucose, Bld: 124 — ABNORMAL HIGH
Glucose, Bld: 130 — ABNORMAL HIGH
Glucose, Bld: 133 — ABNORMAL HIGH
Glucose, Bld: 142 — ABNORMAL HIGH
Glucose, Bld: 147 — ABNORMAL HIGH
Glucose, Bld: 152 — ABNORMAL HIGH
Glucose, Bld: 157 — ABNORMAL HIGH
Glucose, Bld: 166 — ABNORMAL HIGH
Glucose, Bld: 183 — ABNORMAL HIGH
Potassium: 4.4
Potassium: 3.4 — ABNORMAL LOW
Potassium: 3.5
Potassium: 3.5
Potassium: 3.5
Potassium: 3.8
Potassium: 4
Potassium: 4.1
Potassium: 4.5
Potassium: 4.6
Sodium: 137
Sodium: 136
Sodium: 137
Sodium: 138
Sodium: 138
Sodium: 138
Sodium: 139
Sodium: 142
Sodium: 143
Sodium: 147 — ABNORMAL HIGH

## 2011-06-19 LAB — CROSSMATCH
ABO/RH(D): O POS
Antibody Screen: NEGATIVE

## 2011-06-19 LAB — PREPARE FRESH FROZEN PLASMA

## 2011-06-19 LAB — PREPARE CRYOPRECIPITATE

## 2011-06-19 LAB — TYPE AND SCREEN
ABO/RH(D): O POS
Antibody Screen: NEGATIVE

## 2011-06-19 LAB — DIFFERENTIAL
Basophils Absolute: 0
Basophils Absolute: 0
Basophils Relative: 1
Basophils Relative: 0
Eosinophils Absolute: 0.2
Eosinophils Absolute: 0
Eosinophils Relative: 3
Eosinophils Relative: 0
Lymphocytes Relative: 27
Lymphocytes Relative: 13
Lymphs Abs: 1.5
Lymphs Abs: 1.3
Monocytes Absolute: 0.4
Monocytes Absolute: 0.4
Monocytes Relative: 8
Monocytes Relative: 4
Neutro Abs: 3.5
Neutro Abs: 8.3 — ABNORMAL HIGH
Neutrophils Relative %: 62
Neutrophils Relative %: 83 — ABNORMAL HIGH

## 2011-06-19 LAB — PREPARE RBC (CROSSMATCH)

## 2011-06-19 LAB — HEPATIC FUNCTION PANEL
ALT: 19
AST: 16
Albumin: 3 — ABNORMAL LOW
Alkaline Phosphatase: 82
Bilirubin, Direct: 0.1
Indirect Bilirubin: 0.6
Total Bilirubin: 0.7
Total Protein: 6.2

## 2011-06-19 LAB — PROTIME-INR
INR: 0.9
INR: 1.3
INR: 1.4
Prothrombin Time: 12.7
Prothrombin Time: 16.2 — ABNORMAL HIGH
Prothrombin Time: 17.8 — ABNORMAL HIGH

## 2011-06-19 LAB — COMPREHENSIVE METABOLIC PANEL
ALT: 41
AST: 43 — ABNORMAL HIGH
Albumin: 3.5
Alkaline Phosphatase: 63
BUN: 25 — ABNORMAL HIGH
CO2: 20
Calcium: 9.2
Chloride: 108
Creatinine, Ser: 1.59 — ABNORMAL HIGH
GFR calc Af Amer: 56 — ABNORMAL LOW
GFR calc non Af Amer: 46 — ABNORMAL LOW
Glucose, Bld: 329 — ABNORMAL HIGH
Potassium: 4.7
Sodium: 137
Total Bilirubin: 1.3 — ABNORMAL HIGH
Total Protein: 5.9 — ABNORMAL LOW

## 2011-06-19 LAB — APTT
aPTT: 25
aPTT: 34
aPTT: 34

## 2011-06-19 LAB — B-NATRIURETIC PEPTIDE (CONVERTED LAB): Pro B Natriuretic peptide (BNP): <30

## 2011-06-19 LAB — ABO/RH: ABO/RH(D): O POS

## 2011-06-19 LAB — HEMOGLOBIN AND HEMATOCRIT, BLOOD
HCT: 24.1 — ABNORMAL LOW
Hemoglobin: 8.6 — ABNORMAL LOW

## 2011-06-19 LAB — CARDIAC PANEL(CRET KIN+CKTOT+MB+TROPI)
CK, MB: 24.2 — ABNORMAL HIGH
Relative Index: 5.7 — ABNORMAL HIGH
Total CK: 426 — ABNORMAL HIGH
Troponin I: 5.24

## 2011-06-19 LAB — PLATELET COUNT: Platelets: 77 — ABNORMAL LOW

## 2011-08-23 ENCOUNTER — Encounter: Payer: Self-pay | Admitting: Cardiology

## 2012-05-25 ENCOUNTER — Emergency Department (HOSPITAL_COMMUNITY)
Admission: EM | Admit: 2012-05-25 | Discharge: 2012-05-25 | Disposition: A | Discharge status: Home / Self Care | Payer: Medicare Other | Attending: Emergency Medicine | Admitting: Emergency Medicine

## 2012-05-25 ENCOUNTER — Encounter (HOSPITAL_COMMUNITY): Payer: Self-pay

## 2012-05-25 DIAGNOSIS — I10 Essential (primary) hypertension: Secondary | ICD-10-CM | POA: Insufficient documentation

## 2012-05-25 DIAGNOSIS — R5383 Other fatigue: Secondary | ICD-10-CM | POA: Insufficient documentation

## 2012-05-25 DIAGNOSIS — R5381 Other malaise: Secondary | ICD-10-CM | POA: Insufficient documentation

## 2012-05-25 DIAGNOSIS — Z7982 Long term (current) use of aspirin: Secondary | ICD-10-CM | POA: Insufficient documentation

## 2012-05-25 DIAGNOSIS — E119 Type 2 diabetes mellitus without complications: Secondary | ICD-10-CM | POA: Insufficient documentation

## 2012-05-25 DIAGNOSIS — Z8673 Personal history of transient ischemic attack (TIA), and cerebral infarction without residual deficits: Secondary | ICD-10-CM | POA: Insufficient documentation

## 2012-05-25 DIAGNOSIS — R531 Weakness: Secondary | ICD-10-CM

## 2012-05-25 DIAGNOSIS — Z79899 Other long term (current) drug therapy: Secondary | ICD-10-CM | POA: Insufficient documentation

## 2012-05-25 HISTORY — DX: Hypo-osmolality and hyponatremia: E87.1

## 2012-05-25 HISTORY — DX: Altered mental status, unspecified: R41.82

## 2012-05-25 HISTORY — DX: Hyperkalemia: E87.5

## 2012-05-25 HISTORY — DX: Bipolar disorder, unspecified: F31.9

## 2012-05-25 HISTORY — DX: Atherosclerotic heart disease of native coronary artery without angina pectoris: I25.10

## 2012-05-25 HISTORY — DX: Hyperglycemia, unspecified: R73.9

## 2012-05-25 LAB — TROPONIN I: Troponin I: <0.3 ng/mL (ref <0.30)

## 2012-05-25 LAB — CBC WITH DIFFERENTIAL/PLATELET
Basophils Absolute: 0 10*3/uL (ref 0.0–0.1)
Basophils Relative: 0 % (ref 0–1)
Eosinophils Absolute: 0.2 10*3/uL (ref 0.0–0.7)
Eosinophils Relative: 4 % (ref 0–5)
HCT: 41.3 % (ref 39.0–52.0)
Hemoglobin: 14.9 g/dL (ref 13.0–17.0)
Neutro Abs: 2.8 10*3/uL (ref 1.7–7.7)
Neutrophils Relative %: 48 % (ref 43–77)
RBC: 4.69 MIL/uL (ref 4.22–5.81)

## 2012-05-25 LAB — BASIC METABOLIC PANEL
Calcium: 9.3 mg/dL (ref 8.4–10.5)
GFR calc Af Amer: 45 mL/min — ABNORMAL LOW (ref >90)
GFR calc non Af Amer: 39 mL/min — ABNORMAL LOW (ref >90)
Sodium: 138 mEq/L (ref 135–145)

## 2012-05-25 LAB — VALPROIC ACID LEVEL: Valproic Acid Lvl: 48.6 ug/mL — ABNORMAL LOW (ref 50.0–100.0)

## 2012-05-25 NOTE — ED Notes (Signed)
Patient with no complaints at this time. Respirations even and unlabored. Skin warm/dry. Discharge instructions reviewed with patient at this time. Patient given opportunity to voice concerns/ask questions. IV removed per policy and band-aid applied to site. Patient discharged at this time and left Emergency Department with steady gait.  

## 2012-05-25 NOTE — ED Provider Notes (Signed)
History  This chart was scribed for Geoffery Lyons, MD by Erskine Emery. This patient was seen in room APA04/APA04 and the patient's care was started at 13:28.   CSN: [id number]  Arrival date & time 05/25/12  1326   First MD Initiated Contact with Patient 05/25/12 1328    Level 5 Caveat--Mental Status Change    Chief Complaint  Patient presents with  . Weakness    (Consider location/radiation/quality/duration/timing/severity/associated sxs/prior treatment) The history is provided by the patient. The history is limited by the condition of the patient. No language interpreter was used.   Derrick Butler is a 56 y.o. male brought in by ambulance, who presents to the Emergency Department complaining of weakness, dizziness, diaphoresis, and an unsteady gait since this morning. Pt reports he went to bed feeling okay, he is not hurting now, and has no nausea, diarrhea, emesis, cough, chest pain, or SOB. Pt can't recall any medications he is on or any medical conditions he has other than DM.  Dr. Juanetta Gosling is the pt's PCP.  Past Medical History  Diagnosis Date  . Aortic dissection   . Hypertension   . CVA (cerebral infarction)   . Aortic dissection   . Hypertension   . CVA (cerebral infarction)   . Diabetes mellitus   . Cardiomyopathy   . Hyperglycemia   . Bipolar 1 disorder   . Hyperkalemia   . Coronary artery disease   . Hyponatremia   . Altered mental status     with psychosis    Past Surgical History  Procedure Date  . Ascending aortic aneurysm repair     Family History  Problem Relation Age of Onset  . Alzheimer's disease Mother   . Stroke Father   . Heart disease Father     History  Substance Use Topics  . Smoking status: Unknown If Ever Smoked  . Smokeless tobacco: Not on file  . Alcohol Use: No      Review of Systems  Unable to perform ROS: Mental status change      Allergies  Review of patient's allergies indicates no known allergies.  Home  Medications   Current Outpatient Rx  Name Route Sig Dispense Refill  . ASPIRIN 325 MG PO TABS Oral Take 325 mg by mouth daily.      [name] CLONIDINE HCL 0.3 MG PO TABS Oral Take 0.3 mg by mouth daily.      [name] DIVALPROEX SODIUM 250 MG PO TBEC Oral Take 250 mg by mouth 2 (two) times daily.      . GLYBURIDE 5 MG PO TABS Oral Take 5 mg by mouth daily with breakfast.      . HALOPERIDOL 1 MG PO TABS Oral Take 1 mg by mouth 2 (two) times daily.      [name] LABETALOL HCL 200 MG PO TABS Oral Take 200 mg by mouth 2 (two) times daily.      [name] POTASSIUM CHLORIDE 20 MEQ PO PACK Oral Take 20 mEq by mouth daily.      [name] RAMIPRIL 10 MG PO TABS Oral Take 10 mg by mouth daily.      [name] SIMVASTATIN 40 MG PO TABS Oral Take 40 mg by mouth at bedtime.      [name] TEMAZEPAM 15 MG PO CAPS Oral Take 15 mg by mouth at bedtime.        BP 100/66  Pulse 67  Temp 98.3 F (36.8 C) (Oral)  Resp 18  SpO2 98%  Physical Exam  Nursing note and vitals reviewed. Constitutional: He is oriented to person, place, and time. He appears well-developed and well-nourished. No distress.  HENT:  Head: Normocephalic and atraumatic.  Eyes: EOM are normal. Pupils are equal, round, and reactive to light.  Neck: Neck supple. No tracheal deviation present.  Cardiovascular: Normal rate.   Pulmonary/Chest: Effort normal. No respiratory distress.  Abdominal: Soft. He exhibits no distension.  Musculoskeletal: Normal range of motion. He exhibits no edema.  Neurological: He is alert and oriented to person, place, and time.  Skin: Skin is warm and dry.  Psychiatric: He has a normal mood and affect.    ED Course  Procedures (including critical care time) DIAGNOSTIC STUDIES: Oxygen Saturation is 97% on room air, adequate by my interpretation.    COORDINATION OF CARE: 13:47--I evaluated the patient.  15:05--I rechecked the pt who is still not experiencing any pain.    Labs Reviewed  CBC WITH DIFFERENTIAL  BASIC METABOLIC PANEL  TROPONIN I   No  results found.   No diagnosis found.   Date: 05/25/2012  Rate: 63  Rhythm: normal sinus rhythm  QRS Axis: normal  Intervals: PR prolonged  ST/T Wave abnormalities: normal  Conduction Disutrbances:first-degree A-V block   Narrative Interpretation:   Old EKG Reviewed: unchanged    MDM  The patient presents here for evaluation of weakness, sweating, and being unsteady on his feet since this morning.  I was told that his pulse was 170 when EMS arrived but has been sinus in the 60's since arriving here.  I find nothing on the workup here that would explain his symptoms that occurred earlier and he tells me feels fine at present.  This patient is somewhat difficult to get a history from as he has a history of mental illness.  He also has a history of thoracic aortic aneurysm repair which I doubt has anything to do with today's presentation.  It is quite possible there was some sort of cardiac arrhythmia that occurred and resolved.  At this point, I will discharge him to home, with prn follow up.        I personally performed the services described in this documentation, which was scribed in my presence. The recorded information has been reviewed and considered.      Geoffery Lyons, MD 05/25/12 [phone]

## 2012-05-25 NOTE — ED Notes (Signed)
Pt was taking shower, became very weak per staff at family care home had unsteady gait, sweating.  Arrived by ems--pt denies any complaints at arrival, ambulatory.

## 2012-05-25 NOTE — ED Notes (Signed)
Call Judie Grieve [phone] when patient is discharged

## 2012-05-25 NOTE — ED Notes (Signed)
Judie Grieve called for patient transport back to facility. Awaiting ride.

## 2013-06-02 ENCOUNTER — Encounter (HOSPITAL_COMMUNITY): Payer: Self-pay | Admitting: Psychiatry

## 2013-06-02 ENCOUNTER — Ambulatory Visit (INDEPENDENT_AMBULATORY_CARE_PROVIDER_SITE_OTHER): Payer: Medicare Other | Admitting: Psychiatry

## 2013-06-02 VITALS — BP 140/80 | Ht 68.0 in | Wt 172.0 lb

## 2013-06-02 DIAGNOSIS — F259 Schizoaffective disorder, unspecified: Secondary | ICD-10-CM

## 2013-06-02 DIAGNOSIS — F3160 Bipolar disorder, current episode mixed, unspecified: Secondary | ICD-10-CM

## 2013-06-02 MED ORDER — QUETIAPINE FUMARATE 50 MG PO TABS: 50.0000 mg | ORAL_TABLET | Freq: Three times a day (TID) | ORAL | Status: DC

## 2013-06-02 MED ORDER — BENZTROPINE MESYLATE 1 MG PO TABS: 0.5000 mg | ORAL_TABLET | Freq: Three times a day (TID) | ORAL | Status: DC

## 2013-06-02 NOTE — Progress Notes (Signed)
Psychiatric Assessment Adult  Patient Identification:  Derrick Butler Date of Evaluation:  06/02/2013 Chief Complaint: Confusion, running away from his facility History of Chief Complaint:   Chief Complaint  Patient presents with  . Depression  . Hallucinations  . Establish Care    HPI this patient is a 57 year old single white male who lives in [facility] in [place]. He is accompanied by his friend Nickie Retort has power of attorney and an aide from the home named Nedra Hai. He is on disability.  According to [facility], the patient's father and 2 sisters all have bipolar disorder. The patient was diagnosed with this as well many years ago. For a while he functioned and worked in a [employer] fund[employer]. He was injured on the job and had a head injury. In 2002 he had a stroke. 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 He's had other significant medical problems such as cardiomyopathy, aortic dissection type 2 diabetes and hypertension.  He's been known to be mentally ill for number of years although the diagnosis is not entirely clear. At one point he was admitted to the behavioral health hospital here in 2008 for being agitated and hyperreligious. He was put on Depakote and remained on Haldol. He's been on Haldol for quite a number of years. His movements are stiff and he does have cogwheeling rigidity today.  The patient is brought in today because he seems to be getting worse. In the last few months he's run away from this facility several times. He sleeps all day and if he is asked to do anything he gets angry and agitated. He has run from the facility and can't rise into town and at one point was found lying in a ditch. Impossible to get much history from him because he just paces the room and keeps stating that he wants to leave. According to his aid, this is how he is is all the time Review of Systems  Psychiatric/Behavioral: Positive for  behavioral problems and agitation. The patient is nervous/anxious and is hyperactive.    Physical Exam not done  Depressive Symptoms: depressed mood, psychomotor agitation, loss of energy/fatigue,  (Hypo) Manic Symptoms:   Elevated Mood:  No Irritable Mood:  Yes Grandiosity:  No Distractibility:  Yes Labiality of Mood:  Yes Delusions:  Yes Hallucinations:  No Impulsivity:  Yes Sexually Inappropriate Behavior:  No Financial Extravagance:  No Flight of Ideas:  No  Anxiety Symptoms: Excessive Worry:  No Panic Symptoms:  No Agoraphobia:  No Obsessive Compulsive: no  Symptoms: None, Specific Phobias:  No Social Anxiety:  Yes  Psychotic Symptoms:  Hallucinations: No  Delusions:  Yes Paranoia:  No   Ideas of Reference:  No  PTSD Symptoms: Ever had a traumatic exposure:  No Had a traumatic exposure in the last month:  No Re-experiencing: No None Hypervigilance:  No Hyperarousal: No None Avoidance: No None  Traumatic Brain Injury: Yes work related  Past Psychiatric History: Diagnosis: Bipolar disorder   Hospitalizations: 2008 at the behavioral health hospital, sometime before that at [facility]   Outpatient Care: None noted in the chart   Substance Abuse Care: n/a  Self-Mutilation: none  Suicidal Attempts: none  Violent Behaviors: none   Past Medical History:   Past Medical History  Diagnosis Date  . Aortic dissection   . Hypertension   . CVA (cerebral infarction)   . Aortic dissection   . Hypertension   .  CVA (cerebral infarction)   . Diabetes mellitus   . Cardiomyopathy   . Hyperglycemia   . Bipolar 1 disorder   . Hyperkalemia   . Coronary artery disease   . Hyponatremia   . Altered mental status     with psychosis   History of Loss of Consciousness:  Yes Seizure History:  No Cardiac History:  Yes Allergies:  No Known Allergies Current Medications:  Current Outpatient Prescriptions  Medication Sig Dispense Refill  . aspirin 325 MG  tablet Take 325 mg by mouth daily.        . cloNIDine (CATAPRES) 0.3 MG tablet Take 0.3 mg by mouth daily.        . divalproex (DEPAKOTE) 250 MG DR tablet Take 250 mg by mouth 2 (two) times daily.        [name] glyBURIDE (DIABETA) 5 MG tablet Take 5 mg by mouth daily with breakfast.        . labetalol (NORMODYNE) 200 MG tablet Take 200 mg by mouth 2 (two) times daily.        . potassium chloride (KLOR-CON) 20 MEQ packet Take 20 mEq by mouth daily.        . ramipril (ALTACE) 10 MG tablet Take 10 mg by mouth daily.        . simvastatin (ZOCOR) 40 MG tablet Take 40 mg by mouth at bedtime.        . temazepam (RESTORIL) 15 MG capsule Take 15 mg by mouth at bedtime.        . benztropine (COGENTIN) 1 MG tablet Take 0.5 tablets (0.5 mg total) by mouth 3 (three) times daily.  90 tablet  2  . QUEtiapine (SEROQUEL) 50 MG tablet Take 1 tablet (50 mg total) by mouth 3 (three) times daily.  90 tablet  2   No current facility-administered medications for this visit.    Previous Psychotropic Medications:  Medication Dose   Haldol   2 mg 3 times a day   Depakote ER   250 mg 3 times a day                   Substance Abuse History in the last 12 months: Substance Age of 1st Use Last Use Amount Specific Type  Nicotine      Alcohol      Cannabis      Opiates      Cocaine      Methamphetamines      LSD      Ecstasy      Benzodiazepines      Caffeine      Inhalants      Others:                          Medical Consequences of Substance Abuse: n/a  Legal Consequences of Substance Abuse:n/a  Family Consequences of Substance Abuse: n/a  Blackouts:  No DT's:  No Withdrawal Symptoms:  No None  Social History: Current Place of Residence: [facility], [place] Place of Birth: [place], [place] Family Members: 2 living sisters Marital Status:  Single Children:   Sons:   Daughters:  Relationships: Nickie Retort, and family friend who has power of attorney Education:  [employer]  Graduate Educational Problems/Performance:  Religious Beliefs/Practices: Unknown History of Abuse: none Occupational Experiences; meat packing factory for 20 years Military History:  None. Legal History:  Hobbies/Interests: None  Family History:   Family History  Problem Relation  Age of Onset  . Alzheimer's disease Mother   . Stroke Father   . Heart disease Father   . Bipolar disorder Father   . Bipolar disorder Sister   . Bipolar disorder Sister     Mental Status Examination/Evaluation: Objective:  Appearance: Bizarre and Disheveled he is holding his arms in a stiff fashion and has significant cogwheeling rigidity   Eye Contact::  Poor  Speech:  Garbled  Volume:  Normal  Mood:  Confused and agitated, constantly asking to leave   Affect:  Labile  Thought Process:  Disorganized  Orientation:  Oriented to person only   Thought Content:  Delusions  Suicidal Thoughts:  No  Homicidal Thoughts:  No  Judgement:  Poor  Insight:  Lacking  Psychomotor Activity:  Increased, Restlessness and Shuffling Gait  Akathisia:  Yes  Handed:  Right  AIMS (if indicated):  Cogwheeling rigidity in both upper extremities, akathisia   Assets:  Housing Social Support    Laboratory/X-Ray Psychological Evaluation(s)   Comprehensive metabolic panel, Depakote level      Assessment:  Axis I: Schizoaffective Disorder  AXIS I Schizoaffective Disorder  AXIS II Deferred  AXIS III Past Medical History  Diagnosis Date  . Aortic dissection   . Hypertension   . CVA (cerebral infarction)   . Aortic dissection   . Hypertension   . CVA (cerebral infarction)   . Diabetes mellitus   . Cardiomyopathy   . Hyperglycemia   . Bipolar 1 disorder   . Hyperkalemia   . Coronary artery disease   . Hyponatremia   . Altered mental status     with psychosis     AXIS IV problems related to social environment  AXIS V 31-40 impairment in reality testing   Treatment Plan/Recommendations:  Plan of Care:  Medication management   Laboratory:  Chemistry Profile Depakote level   Psychotherapy:   Medications: Discontinue Haldol due to cogwheeling rigidity. Continued Depakote 250 mg 3 times a day. Start Seroquel 50 mg 3 times a day and Cogentin 0.5 mg 3 times a day   Routine PRN Medications:  No  Consultations:   Safety Concerns:  Possible flight risk. His guardian is looking into placing him in a locked facility   Other:  I will review his labs and make adjustments to the Depakote. He'll return in 4 weeks     Diannia Ruder, MD 9/29/20149:47 AM

## 2013-06-05 ENCOUNTER — Telehealth (HOSPITAL_COMMUNITY): Payer: Self-pay | Admitting: Psychiatry

## 2013-06-05 ENCOUNTER — Other Ambulatory Visit (HOSPITAL_COMMUNITY): Payer: Self-pay | Admitting: Psychiatry

## 2013-06-05 MED ORDER — RISPERIDONE 1 MG PO TABS: 1.0000 mg | ORAL_TABLET | Freq: Three times a day (TID) | ORAL | Status: AC

## 2013-06-05 MED ORDER — BENZTROPINE MESYLATE 1 MG PO TABS: 1.0000 mg | ORAL_TABLET | Freq: Three times a day (TID) | ORAL | Status: DC

## 2013-06-05 NOTE — Telephone Encounter (Signed)
Changed med from Seroquel to risperdal i mg tid.

## 2013-06-07 ENCOUNTER — Encounter (HOSPITAL_COMMUNITY): Payer: Self-pay | Admitting: Emergency Medicine

## 2013-06-07 ENCOUNTER — Inpatient Hospital Stay (HOSPITAL_COMMUNITY)
Admission: EM | Admit: 2013-06-07 | Discharge: 2013-06-19 | DRG: 640 | Disposition: A | Discharge status: Skilled Nursing Facility | Payer: Medicare Other | Attending: Pulmonary Disease | Admitting: Pulmonary Disease

## 2013-06-07 DIAGNOSIS — I10 Essential (primary) hypertension: Secondary | ICD-10-CM | POA: Diagnosis present

## 2013-06-07 DIAGNOSIS — I2510 Atherosclerotic heart disease of native coronary artery without angina pectoris: Secondary | ICD-10-CM | POA: Diagnosis present

## 2013-06-07 DIAGNOSIS — Z818 Family history of other mental and behavioral disorders: Secondary | ICD-10-CM

## 2013-06-07 DIAGNOSIS — Z823 Family history of stroke: Secondary | ICD-10-CM

## 2013-06-07 DIAGNOSIS — R4182 Altered mental status, unspecified: Secondary | ICD-10-CM | POA: Diagnosis present

## 2013-06-07 DIAGNOSIS — Z820 Family history of epilepsy and other diseases of the nervous system: Secondary | ICD-10-CM

## 2013-06-07 DIAGNOSIS — N183 Chronic kidney disease, stage 3 unspecified: Secondary | ICD-10-CM | POA: Diagnosis present

## 2013-06-07 DIAGNOSIS — E860 Dehydration: Principal | ICD-10-CM | POA: Diagnosis present

## 2013-06-07 DIAGNOSIS — J189 Pneumonia, unspecified organism: Secondary | ICD-10-CM | POA: Clinically undetermined

## 2013-06-07 DIAGNOSIS — I7100 Dissection of unspecified site of aorta: Secondary | ICD-10-CM | POA: Diagnosis present

## 2013-06-07 DIAGNOSIS — E119 Type 2 diabetes mellitus without complications: Secondary | ICD-10-CM | POA: Diagnosis present

## 2013-06-07 DIAGNOSIS — I428 Other cardiomyopathies: Secondary | ICD-10-CM | POA: Diagnosis present

## 2013-06-07 DIAGNOSIS — F3160 Bipolar disorder, current episode mixed, unspecified: Secondary | ICD-10-CM | POA: Diagnosis present

## 2013-06-07 DIAGNOSIS — Z23 Encounter for immunization: Secondary | ICD-10-CM

## 2013-06-07 DIAGNOSIS — Z8673 Personal history of transient ischemic attack (TIA), and cerebral infarction without residual deficits: Secondary | ICD-10-CM

## 2013-06-07 DIAGNOSIS — I129 Hypertensive chronic kidney disease with stage 1 through stage 4 chronic kidney disease, or unspecified chronic kidney disease: Secondary | ICD-10-CM | POA: Diagnosis present

## 2013-06-07 DIAGNOSIS — I43 Cardiomyopathy in diseases classified elsewhere: Secondary | ICD-10-CM | POA: Diagnosis present

## 2013-06-07 DIAGNOSIS — I959 Hypotension, unspecified: Secondary | ICD-10-CM | POA: Diagnosis present

## 2013-06-07 DIAGNOSIS — Z8249 Family history of ischemic heart disease and other diseases of the circulatory system: Secondary | ICD-10-CM

## 2013-06-07 LAB — CBC WITH DIFFERENTIAL/PLATELET
Basophils Absolute: 0 10*3/uL (ref 0.0–0.1)
Eosinophils Relative: 3 % (ref 0–5)
HCT: 35.1 % — ABNORMAL LOW (ref 39.0–52.0)
Hemoglobin: 12 g/dL — ABNORMAL LOW (ref 13.0–17.0)
Lymphocytes Relative: 29 % (ref 12–46)
MCV: 89.5 fL (ref 78.0–100.0)
Monocytes Absolute: 0.6 10*3/uL (ref 0.1–1.0)
Monocytes Relative: 8 % (ref 3–12)
RDW: 12.2 % (ref 11.5–15.5)
WBC: 6.7 10*3/uL (ref 4.0–10.5)

## 2013-06-07 LAB — COMPREHENSIVE METABOLIC PANEL
ALT: 10 U/L (ref 0–53)
AST: 16 U/L (ref 0–37)
BUN: 45 mg/dL — ABNORMAL HIGH (ref 6–23)
Creat: 2.31 mg/dL — ABNORMAL HIGH (ref 0.50–1.35)
Total Bilirubin: 0.4 mg/dL (ref 0.3–1.2)

## 2013-06-07 LAB — BASIC METABOLIC PANEL
BUN: 51 mg/dL — ABNORMAL HIGH (ref 6–23)
CO2: 23 mEq/L (ref 19–32)
Chloride: 106 mEq/L (ref 96–112)
Creatinine, Ser: 2.64 mg/dL — ABNORMAL HIGH (ref 0.50–1.35)
Potassium: 5.2 mEq/L — ABNORMAL HIGH (ref 3.5–5.1)

## 2013-06-07 LAB — ETHANOL: Alcohol, Ethyl (B): <11 mg/dL (ref 0–11)

## 2013-06-07 MED ORDER — ZIPRASIDONE MESYLATE 20 MG IM SOLR
10.0000 mg | Freq: Once | INTRAMUSCULAR | Status: AC
  Administered 2013-06-07: 10 mg via INTRAMUSCULAR
  Filled 2013-06-07: qty 20

## 2013-06-07 MED ORDER — LORAZEPAM 2 MG/ML IJ SOLN
2.0000 mg | Freq: Once | INTRAMUSCULAR | Status: AC
  Administered 2013-06-07: 2 mg via INTRAMUSCULAR
  Filled 2013-06-07: qty 1

## 2013-06-07 NOTE — ED Notes (Signed)
Meal tray given 

## 2013-06-07 NOTE — ED Notes (Signed)
Pt is aware of person and place, not to time or situation. Pt ramble in talking, sitter state pt is agitated, needs medication to calm him down.

## 2013-06-07 NOTE — ED Notes (Signed)
Pt continues to be agitated, sitter concerned with pt. Pt will not be still in bed. Up and down, pulling at bed and blankets. MD aware orders given.

## 2013-06-07 NOTE — ED Notes (Signed)
Pt up out of bed, running past sitters, tech helped and assisted pt to hall bed. Orders given by MD. Pt agitated, confused, fighting against staff, Security called, pt assisted back to bed, injection given.

## 2013-06-07 NOTE — ED Notes (Signed)
Pt comes from group home via EMS after staff reports unusual behavior from pt. Staff states pt has psych hx and  was recently taken off Haldol and Seroquel and started on Risperdal. Staff states pt was walking into street and talking to himself and random objects in his room.

## 2013-06-07 NOTE — ED Notes (Signed)
Ford called from [employer], pt unable to do telepsy at this time. Pt clueless, ramble thoughts and ideas, pt is back in bed sitter at the bedside. Will call Ala Dach when pt is appropriate for telepsy [phone]

## 2013-06-07 NOTE — ED Notes (Signed)
Pt attempted to sue bathroom but was unable due to confusion

## 2013-06-07 NOTE — ED Notes (Signed)
Pt ate 50% of dinner, laying in bed no complaints,

## 2013-06-07 NOTE — BH Assessment (Signed)
Called to perform tele-assessment. Spoke with Lurena Joiner who said Pt has been agitated, confused, ran out of the room and has been medicated. Lurena Joiner will call TTS at [phone] when Pt is able to participate in tele-assessment.  Harlin Rain Ria Comment, [facility] Triage Specialist

## 2013-06-07 NOTE — ED Provider Notes (Signed)
CSN: [id number]     Arrival date & time 06/07/13  1523 History  This chart was scribed for Donnetta Hutching, MD by Ardelia Mems, ED Scribe. This patient was seen in room APAH8/APAH8 and the patient's care was started at 4:30 PM.    Chief Complaint  Patient presents with  . V70.1    The history is provided by the patient. No language interpreter was used.    HPI Comments:  Level 5 caveat for psychiatric illness Derrick Butler is a 57 y.o. male with a history of Bipolar 1 disorder and AMS brought by EMS from his group home to the Emergency Department after staff reported noticing unusual behavior from the pt recently. Staff report that pt has a psychiatric history and was recently taken off of his Haldol and Seroquel and started on Risperdal. Staff believes that these medication changes have caused a change in pt's behavior. Staff reported that pt has been walking into the street and talking to himself and random objects in his room. Pt is oriented to his name and location, but he is not aware of the day of week, or the name of the facility he stays at. He denies any other pain or symptoms.   Past Medical History  Diagnosis Date  . Aortic dissection   . Hypertension   . CVA (cerebral infarction)   . Aortic dissection   . Hypertension   . CVA (cerebral infarction)   . Diabetes mellitus   . Cardiomyopathy   . Hyperglycemia   . Bipolar 1 disorder   . Hyperkalemia   . Coronary artery disease   . Hyponatremia   . Altered mental status     with psychosis   Past Surgical History  Procedure Laterality Date  . Ascending aortic aneurysm repair     Family History  Problem Relation Age of Onset  . Alzheimer's disease Mother   . Stroke Father   . Heart disease Father   . Bipolar disorder Father   . Bipolar disorder Sister   . Bipolar disorder Sister    History  Substance Use Topics  . Smoking status: Never Smoker   . Smokeless tobacco: Not on file  . Alcohol Use: No    Review of  Systems A complete 10 system review of systems was obtained and all systems are negative except as noted in the HPI and PMH.   Allergies  Review of patient's allergies indicates no known allergies.  Home Medications   Current Outpatient Rx  Name  Route  Sig  Dispense  Refill  . aspirin 325 MG tablet   Oral   Take 325 mg by mouth daily.           . benztropine (COGENTIN) 1 MG tablet   Oral   Take 0.5 tablets (0.5 mg total) by mouth 3 (three) times daily.   90 tablet   2   . cloNIDine (CATAPRES) 0.3 MG tablet   Oral   Take 0.3 mg by mouth daily.           . divalproex (DEPAKOTE) 250 MG DR tablet   Oral   Take 250 mg by mouth 2 (two) times daily.           [name] glyBURIDE (DIABETA) 5 MG tablet   Oral   Take 5 mg by mouth daily with breakfast.           . labetalol (NORMODYNE) 200 MG tablet   Oral   Take 200 mg by  mouth 2 (two) times daily.           . potassium chloride (KLOR-CON) 20 MEQ packet   Oral   Take 20 mEq by mouth daily.           . QUEtiapine (SEROQUEL) 50 MG tablet   Oral   Take 1 tablet (50 mg total) by mouth 3 (three) times daily.   90 tablet   2   . ramipril (ALTACE) 10 MG tablet   Oral   Take 10 mg by mouth daily.           . risperiDONE (RISPERDAL) 1 MG tablet   Oral   Take 1 tablet (1 mg total) by mouth 3 (three) times daily.   90 tablet   2   . simvastatin (ZOCOR) 40 MG tablet   Oral   Take 40 mg by mouth at bedtime.           . temazepam (RESTORIL) 15 MG capsule   Oral   Take 15 mg by mouth at bedtime.            BP 116/65  Pulse 72  Temp(Src) 98.4 F (36.9 C) (Oral)  Resp 18  Physical Exam  Nursing note and vitals reviewed. Constitutional: He appears well-developed and well-nourished.  HENT:  Head: Normocephalic and atraumatic.  Eyes: Conjunctivae and EOM are normal. Pupils are equal, round, and reactive to light.  Neck: Normal range of motion. Neck supple.  Cardiovascular: Normal rate, regular rhythm and  normal heart sounds.   Pulmonary/Chest: Effort normal and breath sounds normal.  Abdominal: Soft. Bowel sounds are normal.  Musculoskeletal: Normal range of motion.  Neurological: He is alert.  Oriented to location and name, but not to the date.  Skin: Skin is warm and dry.  Psychiatric: He has a normal mood and affect.  Flight of ideas.     ED Course  Procedures (including critical care time)  DIAGNOSTIC STUDIES:  COORDINATION OF CARE: 4:37 PM- Discussed plan for further psychiatric evaluation. Pt and group home staff advised of plan for treatment and they agree.  Labs Review Labs Reviewed  BASIC METABOLIC PANEL - Abnormal; Notable for the following:    Potassium 5.2 (*)    Glucose, Bld 123 (*)    BUN 51 (*)    Creatinine, Ser 2.64 (*)    GFR calc non Af Amer 25 (*)    GFR calc Af Amer 29 (*)    All other components within normal limits  CBC WITH DIFFERENTIAL - Abnormal; Notable for the following:    RBC 3.92 (*)    Hemoglobin 12.0 (*)    HCT 35.1 (*)    Platelets 124 (*)    All other components within normal limits  ETHANOL  URINE RAPID DRUG SCREEN (HOSP PERFORMED)   Imaging Review No results found.  MDM  No diagnosis found. Patient with known long-standing psychiatric history living group home presents with behavioral changes.  New medication Risperdal started recently.   Will get behavioral health consult.    I  changes.personally performed the services described in this documentation, which was scribed in my presence. The recorded information has been reviewed and is accurate.    Donnetta Hutching, MD 06/07/13 [id number]

## 2013-06-07 NOTE — ED Notes (Signed)
Pt trying to get out of bed, sitter in the room with pt

## 2013-06-07 NOTE — ED Notes (Signed)
Called pt's group home [phone]). Talked to [facility] for pt hx. Pt was found outside of group home after home alarm system went off at 2AM. Pt was "not his normal self" according to [facility]. He was running from caretakers and beginning to act violent (grabbing at caretakers). The pt has been at group home for several years, this is the first time Felizardo Hoffmann has seen the patient behave this way. Felizardo Hoffmann states he is normally relaxed and able to perform some ADLs. Pt was switched from Haldol and Seroquil to Risperadol 2 days ago.

## 2013-06-07 NOTE — ED Notes (Signed)
Spoke with Gwynn at [facility] will call back with appointment time.

## 2013-06-08 LAB — URINE MICROSCOPIC-ADD ON

## 2013-06-08 LAB — URINALYSIS, ROUTINE W REFLEX MICROSCOPIC
Bilirubin Urine: NEGATIVE
Ketones, ur: NEGATIVE mg/dL
Nitrite: NEGATIVE
Protein, ur: NEGATIVE mg/dL
Urobilinogen, UA: 0.2 mg/dL (ref 0.0–1.0)
pH: 5.5 (ref 5.0–8.0)

## 2013-06-08 LAB — RAPID URINE DRUG SCREEN, HOSP PERFORMED
Amphetamines: NOT DETECTED
Barbiturates: NOT DETECTED
Benzodiazepines: NOT DETECTED
Tetrahydrocannabinol: NOT DETECTED

## 2013-06-08 MED ORDER — ZIPRASIDONE MESYLATE 20 MG IM SOLR
20.0000 mg | Freq: Once | INTRAMUSCULAR | Status: AC
  Administered 2013-06-08: 20 mg via INTRAMUSCULAR
  Filled 2013-06-08: qty 20

## 2013-06-08 MED ORDER — STERILE WATER FOR INJECTION IJ SOLN
INTRAMUSCULAR | Status: AC
  Administered 2013-06-08: 1.2 mL
  Filled 2013-06-08: qty 10

## 2013-06-08 MED ORDER — LORAZEPAM 1 MG PO TABS
1.0000 mg | ORAL_TABLET | Freq: Once | ORAL | Status: AC
  Administered 2013-06-08: 1 mg via ORAL
  Filled 2013-06-08: qty 1

## 2013-06-08 NOTE — BHH Counselor (Signed)
This Clinical research associate talked with Dr. Patria Mane and requested UA, will have UA cultured.

## 2013-06-08 NOTE — ED Notes (Signed)
Pt wet, changed pt .

## 2013-06-08 NOTE — ED Notes (Signed)
Pt continues to be confused, wetting the bed, reaching out of bed, rolling and moving around in bed. Tech to place diaper on pt. Used condom cath to obtain urine.
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
## 2013-06-08 NOTE — ED Notes (Signed)
Telepsych in process 

## 2013-06-08 NOTE — ED Notes (Signed)
Pt still awaiting bed placement, no recent updates.

## 2013-06-08 NOTE — ED Notes (Signed)
Shirlee Limerick called from facility where pt lives to get report. States pt meds was changed on 9/29, pt had an episode of confusion on 10/2, meds was changed again, pt became worse on 10/4.

## 2013-06-08 NOTE — BHH Counselor (Signed)
Referral faxed to [facility] for review, awaiting disposition.

## 2013-06-08 NOTE — ED Notes (Signed)
Pt wet the bed, pt confused, random thoughts and ideas

## 2013-06-08 NOTE — ED Notes (Signed)
Pt trying to get out of bed, talking about "sledge hammers need to crush those things". Unsure what pt is referring to, assisted pt back in bed and requested medication for pt's anxious state. Pt is shaking the rails.

## 2013-06-08 NOTE — ED Provider Notes (Signed)
8:08 AM Patient had a recent psychiatric evaluation on September 29 by Dr. Tenny Craw.  His presentation seems very similar to that presentation to the office.  Today his speech is pressured and somewhat garbled.  His thought process is disorganized.  He denies suicidal or homicidal thoughts.  He has poor insight and seems to be somewhat restless in the bed.  Patient may benefit from medication adjustments versus inpatient psychiatric hospitalization.  Will rely on the expertise of the psychiatrist to help with disposition and medications.      Lyanne Co, MD 06/08/13 [phone]

## 2013-06-09 ENCOUNTER — Telehealth (HOSPITAL_COMMUNITY): Payer: Self-pay | Admitting: Psychiatry

## 2013-06-09 MED ORDER — DIVALPROEX SODIUM 250 MG PO DR TAB
250.0000 mg | DELAYED_RELEASE_TABLET | Freq: Three times a day (TID) | ORAL | Status: DC
  Administered 2013-06-09 – 2013-06-19 (×26): 250 mg via ORAL
  Filled 2013-06-09 (×29): qty 1

## 2013-06-09 MED ORDER — ZIPRASIDONE MESYLATE 20 MG IM SOLR
20.0000 mg | Freq: Once | INTRAMUSCULAR | Status: AC
  Administered 2013-06-09: 20 mg via INTRAMUSCULAR

## 2013-06-09 MED ORDER — FLUTICASONE PROPIONATE 50 MCG/ACT NA SUSP
2.0000 | Freq: Every day | NASAL | Status: DC
  Administered 2013-06-09 – 2013-06-19 (×10): 2 via NASAL
  Filled 2013-06-09 (×3): qty 16

## 2013-06-09 MED ORDER — LABETALOL HCL 100 MG PO TABS
200.0000 mg | ORAL_TABLET | Freq: Two times a day (BID) | ORAL | Status: DC
  Administered 2013-06-09 – 2013-06-14 (×8): 200 mg via ORAL
  Filled 2013-06-09 (×13): qty 2

## 2013-06-09 MED ORDER — CLONIDINE HCL 0.2 MG PO TABS
ORAL_TABLET | ORAL | Status: AC
  Administered 2013-06-09: 0.2 mg
  Filled 2013-06-09: qty 1

## 2013-06-09 MED ORDER — BENZTROPINE MESYLATE 1 MG PO TABS
1.0000 mg | ORAL_TABLET | Freq: Three times a day (TID) | ORAL | Status: DC
  Administered 2013-06-09 – 2013-06-19 (×26): 1 mg via ORAL
  Filled 2013-06-09 (×30): qty 1

## 2013-06-09 MED ORDER — SULFAMETHOXAZOLE-TMP DS 800-160 MG PO TABS
1.0000 | ORAL_TABLET | Freq: Two times a day (BID) | ORAL | Status: DC
  Administered 2013-06-09: 1 via ORAL
  Filled 2013-06-09 (×2): qty 1

## 2013-06-09 MED ORDER — TEMAZEPAM 15 MG PO CAPS
15.0000 mg | ORAL_CAPSULE | Freq: Every day | ORAL | Status: DC
  Administered 2013-06-10 – 2013-06-18 (×6): 15 mg via ORAL
  Filled 2013-06-09 (×9): qty 1

## 2013-06-09 MED ORDER — ASPIRIN 325 MG PO TABS
ORAL_TABLET | ORAL | Status: AC
  Administered 2013-06-09: 325 mg
  Filled 2013-06-09: qty 1

## 2013-06-09 MED ORDER — RAMIPRIL 10 MG PO CAPS
10.0000 mg | ORAL_CAPSULE | Freq: Every day | ORAL | Status: DC
  Administered 2013-06-09 – 2013-06-14 (×7): 10 mg via ORAL
  Filled 2013-06-09 (×4): qty 1
  Filled 2013-06-09 (×2): qty 2
  Filled 2013-06-09: qty 1

## 2013-06-09 MED ORDER — ASPIRIN 325 MG PO TABS
325.0000 mg | ORAL_TABLET | Freq: Every day | ORAL | Status: DC
  Administered 2013-06-09 – 2013-06-19 (×11): 325 mg via ORAL
  Filled 2013-06-09 (×5): qty 1

## 2013-06-09 MED ORDER — STERILE WATER FOR INJECTION IJ SOLN
INTRAMUSCULAR | Status: AC
  Administered 2013-06-09: 1.2 mL
  Filled 2013-06-09: qty 10

## 2013-06-09 MED ORDER — SIMVASTATIN 20 MG PO TABS
40.0000 mg | ORAL_TABLET | Freq: Every day | ORAL | Status: DC
  Administered 2013-06-12 – 2013-06-18 (×5): 40 mg via ORAL
  Filled 2013-06-09 (×12): qty 2

## 2013-06-09 MED ORDER — ZIPRASIDONE MESYLATE 20 MG IM SOLR
INTRAMUSCULAR | Status: AC
  Filled 2013-06-09: qty 20

## 2013-06-09 MED ORDER — CLONIDINE HCL 0.1 MG PO TABS
ORAL_TABLET | ORAL | Status: AC
  Administered 2013-06-09: 0.1 mg
  Filled 2013-06-09: qty 1

## 2013-06-09 MED ORDER — POTASSIUM CHLORIDE 20 MEQ PO PACK
20.0000 meq | PACK | Freq: Every day | ORAL | Status: DC
  Administered 2013-06-09: 20 meq via ORAL

## 2013-06-09 MED ORDER — GLYBURIDE 5 MG PO TABS
5.0000 mg | ORAL_TABLET | Freq: Every day | ORAL | Status: DC
  Administered 2013-06-10 – 2013-06-14 (×5): 5 mg via ORAL
  Filled 2013-06-09 (×6): qty 1

## 2013-06-09 MED ORDER — RISPERIDONE 1 MG PO TABS
1.0000 mg | ORAL_TABLET | Freq: Three times a day (TID) | ORAL | Status: DC
  Administered 2013-06-09 – 2013-06-19 (×26): 1 mg via ORAL
  Filled 2013-06-09 (×38): qty 1

## 2013-06-09 MED ORDER — CLONIDINE HCL 0.2 MG PO TABS
0.3000 mg | ORAL_TABLET | Freq: Every day | ORAL | Status: DC
  Administered 2013-06-09 – 2013-06-19 (×10): 0.3 mg via ORAL
  Filled 2013-06-09 (×10): qty 1

## 2013-06-09 MED ORDER — POTASSIUM CHLORIDE 20 MEQ PO PACK
PACK | ORAL | Status: AC
  Administered 2013-06-09: 10:00:00
  Filled 2013-06-09: qty 1

## 2013-06-09 NOTE — ED Notes (Signed)
Pt grabbing at things in the air. Remains confused

## 2013-06-09 NOTE — BH Assessment (Signed)
BHH Assessment Progress Note      Pt was denied due to medical acuity-cannot perform his own ADLs has home health nurse.  Per Christiane Ha at [employer]

## 2013-06-09 NOTE — Telephone Encounter (Signed)
Pt currently in ER

## 2013-06-09 NOTE — ED Notes (Signed)
Pt lying in bed half clothed. Night shift staff stated pt kept tearing his paper scrubs and adult diaper off. Pt constantly moving in bed

## 2013-06-09 NOTE — Progress Notes (Signed)
Continued bed finding for inpatient psychiatric treatment for pt.  The following hospitals were faxed referrals: 1)[facility] 3)[facility] 4)[facility]  Blain Pais, MHT/NS

## 2013-06-09 NOTE — ED Notes (Signed)
Patient tried to come off the end of the stretcher, sitter held him up and he started hitting at the sitter. Security, Press photographer, and other nurses at the bedside. Patient assisted back to bed at this time.

## 2013-06-09 NOTE — ED Notes (Signed)
Spoke with Marchelle Folks at [facility]. States some referrals were sent out this morning and she will follow up on them

## 2013-06-09 NOTE — ED Notes (Signed)
Called placed to [facility] [phone] to get an update on bed placement. No answer. Called [phone] phone busy

## 2013-06-09 NOTE — ED Notes (Signed)
Patient pulling at wire basket on wall. Will not listen when asked to leave it alone. Keeps pulling at basket and will not let it go. Mumbling and agitated. MD notified. New order for Geodon obtained. Called [facility] to ask for medication.

## 2013-06-09 NOTE — ED Notes (Signed)
Pt assisted to chair to eat his breakfast. Bedside table in front of pt.

## 2013-06-09 NOTE — ED Provider Notes (Addendum)
As per Dr. Liam Rogers note from yesterday, follow up with [facility] to see status. If patient did not have formal psychiatrist consult, that should be arranged today. Patient did require additional Geodon overnight.   Derrick Jakes, MD 06/09/13 [id number]  Derrick Jakes, MD 06/09/13 [phone]

## 2013-06-09 NOTE — ED Notes (Signed)
Patient took pills and put in his mouth and then spit them out.  Patient then began kicking legs and swinging arms; patient refused his medications.

## 2013-06-09 NOTE — ED Notes (Signed)
Pt given a bed bath and scrubs changed. Pt has mult old bruising on both lower legs. Yellow and green bruising noted on chest

## 2013-06-09 NOTE — ED Notes (Signed)
Per Ala Dach at [employer]: Patient is under review at [facility].

## 2013-06-09 NOTE — BHH Counselor (Signed)
Follow up on bed finding for inpatient psychiatric treatment:  1)  [facility] - Writer spoke to [place] and she is the evening intake Financial controller.  Writer was informed that I would need to speak to the morning shift worker after their treatment team meeting in order to determine bed availability at 11am.  The morning staff person can be reached at [phone]   2)  [facility] [employer] laboratory technician spoke to [place] [phone]).  Writer was informed that she is reviewing the referral for possible placement.
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
## 2013-06-10 ENCOUNTER — Emergency Department (HOSPITAL_COMMUNITY): Payer: Medicare Other

## 2013-06-10 LAB — GLUCOSE, CAPILLARY
Glucose-Capillary: 159 mg/dL — ABNORMAL HIGH (ref 70–99)
Glucose-Capillary: 117 mg/dL — ABNORMAL HIGH (ref 70–99)
Glucose-Capillary: 136 mg/dL — ABNORMAL HIGH (ref 70–99)

## 2013-06-10 LAB — BASIC METABOLIC PANEL
BUN: 46 mg/dL — ABNORMAL HIGH (ref 6–23)
CO2: 19 mEq/L (ref 19–32)
Chloride: 104 mEq/L (ref 96–112)
Glucose, Bld: 117 mg/dL — ABNORMAL HIGH (ref 70–99)
Potassium: 4.6 mEq/L (ref 3.5–5.1)
Sodium: 137 mEq/L (ref 135–145)

## 2013-06-10 LAB — URINE CULTURE

## 2013-06-10 MED ORDER — ASPIRIN 325 MG PO TABS
ORAL_TABLET | ORAL | Status: AC
  Filled 2013-06-10: qty 1

## 2013-06-10 MED ORDER — CLONIDINE HCL 0.1 MG PO TABS
ORAL_TABLET | ORAL | Status: AC
  Filled 2013-06-10: qty 3

## 2013-06-10 MED ORDER — LABETALOL HCL 200 MG PO TABS
ORAL_TABLET | ORAL | Status: AC
  Filled 2013-06-10: qty 1

## 2013-06-10 NOTE — ED Notes (Signed)
Marchelle Folks from [facility] called and states [name] declined pt due to not medically stable because of BUN and creatinine elevation. Darel Hong RN charge nurse advised.

## 2013-06-10 NOTE — ED Notes (Signed)
Patient sleeping in bed at this time. Equal chest rise and fall noted.

## 2013-06-10 NOTE — ED Notes (Signed)
Pt tolerated am meds well. Snack given.  Sitter at bedside.

## 2013-06-10 NOTE — ED Notes (Signed)
Dr. Bebe Shaggy notified that patient did not receive night time medication, stated that was okay and to hold night time medications for tonight.

## 2013-06-10 NOTE — ED Notes (Signed)
Pt had no noted episodes of abnormal behavior during the morning hours, fed self am meal, ambulated to the bathroom assisted by sitter.  Pt was witnessed on previous shift picking at the air, seeing objects that are not present in the exam room.  Pt is alert to self, unable to tell rn day and time.  Will answer with one word responses at times, when asked if he would like a snack, responded with "snack"

## 2013-06-10 NOTE — ED Notes (Signed)
Pt ate entire meal tray, taken to shower for am care in wheelchair, tolerated well.

## 2013-06-10 NOTE — ED Notes (Signed)
Spoke with Derrick Butler @ [facility] regarding placement. States they have not had a chance to follow up with [name] but hopefull they will at shift change at 3pm

## 2013-06-10 NOTE — Progress Notes (Signed)
This Clinical research associate spoke with Derrick Butler at [facility] who reports that patient is not medically stable at this time due to his elevated BUN, creatinine and potassium from CMP on 06/07/13. They will review more recent CMP if available for reconsideration.  TTS to contact [facility] with above recommendation. Denver Faster RN [facility] BSN

## 2013-06-10 NOTE — ED Notes (Signed)
Patient appears very drowsy. Will not answer questions, intermittent jerking while sleeping. Attempted to awaken patient to notify him we were performing EKG and drawing blood. Patient opened eyes, stared at this nurse, then closed eyes again.

## 2013-06-10 NOTE — ED Notes (Signed)
Patient given water to drink. Took a few small sips, patient answers "yes" and "no" to questions, but mumbles and some incomprehensible speech still.

## 2013-06-10 NOTE — Progress Notes (Signed)
[name], MHT received report from Bonita Quin, Charity fundraiser at [facility] requesting EKG, and Chest X-Ray for medical reviiew. Writer also informed RN that patient status of admission will be voluntary. [name] to report back disposition after further review. TTS follow up on today

## 2013-06-10 NOTE — ED Notes (Signed)
EKG faxed to Bruce at [facility]. Notified him that chest x-ray has been completed and should result in approximately 30-45 minutes in system.

## 2013-06-10 NOTE — Progress Notes (Signed)
Derrick Butler MHT followed up with [name] spoke with Gigi Gin, RN who reports that due to patients medical necessities he has been initially declined but referral packet can be resubmitted for review inclusive of behavioral assessments and up to date labs. Writer has compiled information and resubmitted referral packet for review.

## 2013-06-10 NOTE — ED Provider Notes (Signed)
I assumed care of patient for night shift Pt currently resting comfortably, no distress, vitals appropriate Will recheck his BMP due to h/o renal failure and mild hyperKalemia His urine culture thus far is negative, will cancel his bactrim for now His placement is pending   Date: 06/10/2013 0044am  Rate: 68  Rhythm: normal sinus rhythm  QRS Axis: normal  Intervals: normal  ST/T Wave abnormalities: nonspecific ST changes  Conduction Disutrbances:none  Narrative Interpretation: artifact noted  Old EKG Reviewed: unchanged from prior    Joya Gaskins, MD 06/10/13 [phone]

## 2013-06-10 NOTE — ED Notes (Signed)
Patient fidgeting around in bed, picking at clothing. Went into patient's room. Patient now more alert. Asked patient if he was cold and patient responded yes. Warm blanket given. Patient more talkative now than before. Pleasant and smiling. Still mumbles at times

## 2013-06-10 NOTE — ED Notes (Signed)
Pt up to bathroom assisted by sitter.

## 2013-06-10 NOTE — ED Notes (Signed)
Per French Ana, RN patient spit out all medication after she attempted to administer it, and then refused to take it and started kicking legs and flailing arms in bed. Patient now sleeping at this time. Will attempt to administer medication when patient awakens.

## 2013-06-10 NOTE — ED Notes (Signed)
Spoke with Smitty Cords from [facility] regarding patient placement. Stated Sandre Kitty was requesting information on patient in order to see if they can accept patient. Stated to fax over EKG and obtain chest x-ray. Dr. Bebe Shaggy notified.

## 2013-06-10 NOTE — Progress Notes (Signed)
Derrick Butler, MHT followed up with previous referrals submitted. Patient has been declined at [facility]. Still under review at [facility] who expects d/c on 06/10/13

## 2013-06-10 NOTE — ED Notes (Signed)
Pt woke for am meal, alert and answers ?'s, stated he was ready to go home.  Sitter at bedside at present.

## 2013-06-10 NOTE — ED Notes (Signed)
Spoke with Derrick Butler at [facility]. States she has not had a chance to call [name] but will do it now and call us back

## 2013-06-10 NOTE — ED Notes (Signed)
Marchelle Folks called back and I advised her that he has chronic levels of Kidney functions. Marchelle Folks stated she will call [name] back and recheck for admission.

## 2013-06-10 NOTE — ED Notes (Signed)
Pt reoriented to place/time and why he was here. Pt states "oh ok". Pt had denied any hallucinations or hearing voices. Pt appears to have some MR but not charted in history.

## 2013-06-11 LAB — GLUCOSE, CAPILLARY
Glucose-Capillary: 119 mg/dL — ABNORMAL HIGH (ref 70–99)
Glucose-Capillary: 75 mg/dL (ref 70–99)

## 2013-06-11 MED ORDER — LORAZEPAM 2 MG/ML IJ SOLN
2.0000 mg | Freq: Once | INTRAMUSCULAR | Status: AC
  Administered 2013-06-11: 2 mg via INTRAMUSCULAR
  Filled 2013-06-11: qty 1

## 2013-06-11 MED ORDER — CLONIDINE HCL 0.1 MG PO TABS
ORAL_TABLET | ORAL | Status: AC
  Filled 2013-06-11: qty 3

## 2013-06-11 MED ORDER — ASPIRIN 325 MG PO TABS
ORAL_TABLET | ORAL | Status: AC
  Filled 2013-06-11: qty 1

## 2013-06-11 NOTE — ED Notes (Addendum)
[facility] currently sending updated info to [facility] and will call back with update per Tori at [facility].

## 2013-06-11 NOTE — BH Assessment (Signed)
BHH Assessment Progress Note      Spoke to Alpha at [facility] who reports there will only be one discharge tomorrow, but they do still have Derrick Butler's paperwork and she will present it to the psychiatrist this evening.

## 2013-06-11 NOTE — ED Notes (Signed)
Advised that [facility] was sending the trends to show a history of elevated BUN & creatine to [name].

## 2013-06-11 NOTE — ED Notes (Signed)
Pt is lying in bed watching TV. Sitter sitting outside of room with patient.

## 2013-06-11 NOTE — Progress Notes (Signed)
Spoke with [name] 1440 @ [facility] stated no beds were available today. Nurse Practitioner has to review due to pt having history of Kidney Disfunction. She requested to call back tomorrow 10/9 or they will call to inform us of  Dr's review.  Fareed Fung Cathey, MHT

## 2013-06-11 NOTE — ED Notes (Signed)
Pt laying in bed w/ eyes open. Sitter in hallway, NAD noted

## 2013-06-11 NOTE — ED Notes (Signed)
Per Delorise Jackson at [facility] - NP at [facility] wants to review pt's chart for possible acceptance but will not be able to do so until tomorrow.  [facility] will call [name] back tomorrrow, 06/12/13, around 9am for update.

## 2013-06-11 NOTE — ED Notes (Signed)
Spoke with [name] at [facility] - states she has not reviewed labs sent to her this morning.  States even if pt is accepted, there will not be a bed available today.  Will review most recent labs and lab hx and will call with decision of acceptance or not.

## 2013-06-11 NOTE — BH Assessment (Signed)
Per [name] at [facility], she hasn't had chance to review pt again but will do so and call me back.  Evette Cristal, [place] Assessment Counselor

## 2013-06-11 NOTE — ED Notes (Signed)
Pt up to bathroom with sitter - unsteady gait, pt in bathroom but would not sit on toilet.  Pt then attempted to walk out of dept.  Redirected back to room by staff.  Unsteady gait with 2 person assist back to room.  Pt agitated, swatting and elbowing at staff, assisted back to bed.

## 2013-06-11 NOTE — ED Notes (Addendum)
Pt still laying in bed awake. NAD noted. No reply from [facility], charge nurse called [facility] to get update from information that was sent to [facility]. No report back. Was told calling [name] to get update.

## 2013-06-11 NOTE — BH Assessment (Signed)
[facility] Assessment Progress Note      Consulted with staff at [facility] who report pt is not able to be admitted due to medical nees.  RN at [facility] reports she has reviewed pt labs and his BUN and Creatinin appear to be his baseline per labs dating back to 2012.  This consulted with [facility] who requested a copy of these labs for reconsideration.  This Clinical research associate faxed labs.  As of 2200 they had not had the opportunity to review them, but will consider.  They do not expect any discharges tomorrow, Wednesday, but do anticipate some on Thursday.

## 2013-06-11 NOTE — ED Notes (Addendum)
Patient pretending to be asleep. laying on bed snoring with eyes open and talking to himself. Patient has not slept all night. Offered patient the urinal and to go to restroom refused. Patient has not urinated all night also.

## 2013-06-11 NOTE — ED Notes (Signed)
Assisted to restroom. Returned to room w/ no complications.

## 2013-06-12 LAB — COMPREHENSIVE METABOLIC PANEL
ALT: 10 U/L (ref 0–53)
Albumin: 3.1 g/dL — ABNORMAL LOW (ref 3.5–5.2)
Alkaline Phosphatase: 52 U/L (ref 39–117)
BUN: 59 mg/dL — ABNORMAL HIGH (ref 6–23)
CO2: 24 mEq/L (ref 19–32)
Chloride: 101 mEq/L (ref 96–112)
GFR calc non Af Amer: 25 mL/min — ABNORMAL LOW (ref >90)
Glucose, Bld: 166 mg/dL — ABNORMAL HIGH (ref 70–99)
Potassium: 4.1 mEq/L (ref 3.5–5.1)
Total Bilirubin: 0.2 mg/dL — ABNORMAL LOW (ref 0.3–1.2)

## 2013-06-12 LAB — CBC
HCT: 37.4 % — ABNORMAL LOW (ref 39.0–52.0)
Hemoglobin: 12.9 g/dL — ABNORMAL LOW (ref 13.0–17.0)
RDW: 12 % (ref 11.5–15.5)
WBC: 5.1 10*3/uL (ref 4.0–10.5)

## 2013-06-12 LAB — GLUCOSE, CAPILLARY: Glucose-Capillary: 83 mg/dL (ref 70–99)

## 2013-06-12 MED ORDER — ASPIRIN 325 MG PO TABS
ORAL_TABLET | ORAL | Status: AC
  Filled 2013-06-12: qty 1

## 2013-06-12 MED ORDER — ZIPRASIDONE MESYLATE 20 MG IM SOLR
20.0000 mg | Freq: Once | INTRAMUSCULAR | Status: AC
  Administered 2013-06-12: 20 mg via INTRAMUSCULAR
  Filled 2013-06-12: qty 20

## 2013-06-12 MED ORDER — CLONIDINE HCL 0.1 MG PO TABS
ORAL_TABLET | ORAL | Status: AC
  Filled 2013-06-12: qty 3

## 2013-06-12 MED ORDER — STERILE WATER FOR INJECTION IJ SOLN
INTRAMUSCULAR | Status: AC
  Filled 2013-06-12: qty 10

## 2013-06-12 NOTE — ED Notes (Signed)
Pt awake and agitated. Requesting to go to restroom. Unsteady gait, assisted to restroom with two people.

## 2013-06-12 NOTE — ED Notes (Signed)
Phoned assessment and no on answered phone

## 2013-06-12 NOTE — ED Notes (Signed)
Pt's bedding changed and bathed. Readjusted in bed, responded to request for water. Resting calmly now. Sitter at bedside

## 2013-06-12 NOTE — Progress Notes (Signed)
B.Maegen Wigle, MHT received report from [facility] at [place] that patient has been declined by Dr. Anda Kraft who believes patients has medical issues and not psych and is not appropriate for there unit.

## 2013-06-12 NOTE — ED Notes (Signed)
rn spoke to Petal from [facility], they are resending pt's info to [facility]. Also still working on placement at [facility]. Will review his chart again at 9:30 when NP arrives.

## 2013-06-12 NOTE — Progress Notes (Signed)
[facility] Monday with Debbie 1430 Pt was denied due to acuity.  Alvester Eads Cathey, MHT

## 2013-06-12 NOTE — BH Assessment (Addendum)
Writer spoke with [name] at [facility]. She reports that she has reviewed referral and her NP Fulton Mole will review referral once she arrives. NP doesn't get to work until after 10. [name] sts there is some concern re: pt's kidney function as [facility] doesn't have nephrologist on staff if something were to go wrong w/ pt's kidneys.    Ponciano Ort at [facility] says he doesn't have pt's referral which was faxed 10/8. Ed says they do have two available beds and TTS can fax referral again. Writer faxed referral to [facility] to find out if they've reviewed pt's faxed referral.    Evette Cristal, [facility] Assessment Counselor

## 2013-06-12 NOTE — Progress Notes (Signed)
B.Abdikadir Fohl, MHT spoke with Christiane Ha of [facility] who is reviewing patients referral and is inquiring of current behaviors. Writer submitted to Christiane Ha current documentation noted from sitter, and nurses regarding patients behaviors at 4:30pm

## 2013-06-12 NOTE — ED Notes (Signed)
pts meds arrived from the pharmacy, pt took meds assisted by staff. Pt walked to bathroom assisted by 2 staff members. Pt became angry when staff was unable to leave the pt alone in the bathroom (pt is very unsteady when standing and walking).   Sitter has been at bedside of pt. Pt given new pants to wear, pt had gotten urine on front of clothing. Pt given am snack, sugar free jello, diet sprite.  Pt fed self. 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

## 2013-06-12 NOTE — ED Notes (Signed)
Research scientist (physical sciences) at bedside. Patient keep putting legs over side of the bed. Patient made racial statement to sitter.

## 2013-06-12 NOTE — ED Notes (Signed)
Patient has been very restless, hitting at sitter, trying to get OOB.  Order for Geodon recv'd. And given.

## 2013-06-12 NOTE — ED Notes (Signed)
Spoke w/care provider at [facility] regarding what patient's baseline is.  States when he was on Haldol and Seroquel, he was "laid back" and slept most of the day.  He was able to dress himself w/minimal assistance.  She stated he was able to ambulate w/out assistance.

## 2013-06-12 NOTE — Progress Notes (Addendum)
Leonette MonarchSherron Monday with [facility] @ 1145 no beds available. [facility]: @ 1157 Left message on voice mail to check status of referral. [facility]: @ 1205 received call from [facility] never received faxed referral, paper work refaxed [name]: @ 1217 Left message on answering machine to return call regarding referral submitted. [facility]: @ 1220 Faxed referral Toyia Jelinek Cathey MHT

## 2013-06-12 NOTE — BH Assessment (Signed)
Writer contacted [facility] to follow up with patient's referral. Transferred to the intake voicemail. Left a voicemail inquiring about patients disposition. Asked that staff returns call asap.  Shared this information with Yolande Jolly [facility] tech) and she will also follow up with this patient's disposition.

## 2013-06-12 NOTE — ED Notes (Signed)
Call from [facility] heath to obtain labs and updated labs.

## 2013-06-12 NOTE — ED Notes (Signed)
Phoned behavioral health to confirm if chart was received by [facility]

## 2013-06-12 NOTE — ED Notes (Signed)
Sitter reports having difficulty keeping patient in bed.  Patient lying sideways in bed, but states he is comfortable.  Gave 1600 meds at this time.  Speech continues to be full of neologisms and word salad.  No comprehensible flow of conversation.

## 2013-06-12 NOTE — ED Notes (Signed)
Restlessness has eased. Patient resting quietly at present.

## 2013-06-13 LAB — GLUCOSE, CAPILLARY
Glucose-Capillary: 134 mg/dL — ABNORMAL HIGH (ref 70–99)
Glucose-Capillary: 105 mg/dL — ABNORMAL HIGH (ref 70–99)

## 2013-06-13 LAB — URINALYSIS, ROUTINE W REFLEX MICROSCOPIC
Bilirubin Urine: NEGATIVE
Nitrite: NEGATIVE
Specific Gravity, Urine: 1.025 (ref 1.005–1.030)
Urobilinogen, UA: 0.2 mg/dL (ref 0.0–1.0)
pH: 6 (ref 5.0–8.0)

## 2013-06-13 LAB — URINE MICROSCOPIC-ADD ON

## 2013-06-13 MED ORDER — CLONIDINE HCL 0.1 MG PO TABS
ORAL_TABLET | ORAL | Status: AC
  Filled 2013-06-13: qty 3

## 2013-06-13 MED ORDER — SODIUM CHLORIDE 0.9 % IV SOLN
Freq: Once | INTRAVENOUS | Status: AC
  Administered 2013-06-16: 10:00:00 via INTRAVENOUS

## 2013-06-13 NOTE — Progress Notes (Signed)
Underwriter faxed referrals for availability for the following hosptials: 1)[facility] 3)[facility] 4)[facility] 5)[facility], MHT/NS

## 2013-06-13 NOTE — ED Notes (Signed)
Attempted to wake pt to encourage PO fluid intake, unable to do so. Pt rouses only slightly, opens eyes, no eye contact. No attempt to verbalize. Dr. Hyacinth Meeker consulted

## 2013-06-13 NOTE — BH Assessment (Signed)
According to documentation Pt has not been assessed since 10/05. Called [facility] and spoke with Jasmine December, RN who said Pt is asleep and now would not be a good time for re-assessment. I will pass on to TTS morning shift that Pt needs re-assessment.  Harlin Rain Ria Comment, [facility] Triage Specialist

## 2013-06-13 NOTE — Progress Notes (Addendum)
[name]: 1100 spoke with Delorise Shiner pt was denied [place]: 1115 spoke with [facility] No Beds today [facility]:1122 No beds available today [facility]:1123 previously declined per shift report.  Kostantinos Tallman Cathey, MHT

## 2013-06-13 NOTE — ED Notes (Signed)
Per report from night shift pt information was faxed to [facility] to see if he could get a bed at [place].  Per night shift there hasnt been a call back about this.

## 2013-06-13 NOTE — ED Notes (Signed)
AC and charge nurse discussed pt.  Await call on pt placement

## 2013-06-13 NOTE — ED Provider Notes (Signed)
Filed Vitals:   06/12/13 2030  BP: 114/72  Pulse: 66  Temp: 97.6 F (36.4 C)  Resp: 20   Stable this am.  Awaiting placement.  Celene Kras, MD 06/13/13 [phone]

## 2013-06-13 NOTE — Progress Notes (Signed)
1710 Spoke with Brandy RN @[facility] informed her of denial updates received today and that we are continuing to seek placement at this time.  Briannon Boggio Cathey, MHT

## 2013-06-13 NOTE — ED Notes (Signed)
Spoke with Yolande Jolly with [facility] concerning bed placement for pt, [name] declined due to renal function, [facility] declined as well, looking into [facility] and [facility] for placement with no beds available at this time

## 2013-06-13 NOTE — ED Notes (Signed)
Patient has trained sitter at bedside. Patient is asleep at this.

## 2013-06-13 NOTE — Progress Notes (Signed)
Underwriter followed up with previous faxed out referrals. The following hospitals have denied pt: 1)[facility]-too aggressive and sexually inappropriate for the unit 2)[facility]-denied 3)[facility]- denied 4)[facility]-denied per Darl Pikes 5)[facility]-denied 6)[facility]-denied 7)[facility]-no beds 8)[facility]-denied  Blain Pais, MHT/NS

## 2013-06-13 NOTE — Progress Notes (Signed)
[facility], disposition RN has left for the night callback at 7am.  Blain Pais, MHT/NS

## 2013-06-13 NOTE — BH Assessment (Signed)
[facility] Assessment Progress Note      Spoke with [name] earlier this evening and they requested updated BMT and labs as well as MAR and vitals for consideration for admission.  Faxed to [facility].

## 2013-06-14 ENCOUNTER — Encounter (HOSPITAL_COMMUNITY): Payer: Self-pay | Admitting: Emergency Medicine

## 2013-06-14 ENCOUNTER — Emergency Department (HOSPITAL_COMMUNITY): Payer: Medicare Other

## 2013-06-14 DIAGNOSIS — J189 Pneumonia, unspecified organism: Secondary | ICD-10-CM | POA: Clinically undetermined

## 2013-06-14 DIAGNOSIS — F3160 Bipolar disorder, current episode mixed, unspecified: Secondary | ICD-10-CM

## 2013-06-14 DIAGNOSIS — E119 Type 2 diabetes mellitus without complications: Secondary | ICD-10-CM

## 2013-06-14 LAB — COMPREHENSIVE METABOLIC PANEL
ALT: 10 U/L (ref 0–53)
Albumin: 3 g/dL — ABNORMAL LOW (ref 3.5–5.2)
Alkaline Phosphatase: 51 U/L (ref 39–117)
BUN: 44 mg/dL — ABNORMAL HIGH (ref 6–23)
CO2: 24 mEq/L (ref 19–32)
GFR calc Af Amer: 40 mL/min — ABNORMAL LOW (ref >90)
GFR calc non Af Amer: 34 mL/min — ABNORMAL LOW (ref >90)
Glucose, Bld: 97 mg/dL (ref 70–99)
Potassium: 4.1 mEq/L (ref 3.5–5.1)
Sodium: 139 mEq/L (ref 135–145)
Total Bilirubin: 0.3 mg/dL (ref 0.3–1.2)
Total Protein: 6.2 g/dL (ref 6.0–8.3)

## 2013-06-14 LAB — CBC WITH DIFFERENTIAL/PLATELET
Basophils Absolute: 0 10*3/uL (ref 0.0–0.1)
Basophils Relative: 0 % (ref 0–1)
HCT: 35.6 % — ABNORMAL LOW (ref 39.0–52.0)
Hemoglobin: 12.4 g/dL — ABNORMAL LOW (ref 13.0–17.0)
Lymphs Abs: 2.1 10*3/uL (ref 0.7–4.0)
MCH: 31.2 pg (ref 26.0–34.0)
MCHC: 34.8 g/dL (ref 30.0–36.0)
Monocytes Absolute: 0.3 10*3/uL (ref 0.1–1.0)
Monocytes Relative: 6 % (ref 3–12)
Neutro Abs: 1.9 10*3/uL (ref 1.7–7.7)
Neutrophils Relative %: 43 % (ref 43–77)
Platelets: 111 10*3/uL — ABNORMAL LOW (ref 150–400)
RBC: 3.97 MIL/uL — ABNORMAL LOW (ref 4.22–5.81)
RDW: 11.9 % (ref 11.5–15.5)
WBC: 4.4 10*3/uL (ref 4.0–10.5)

## 2013-06-14 LAB — GLUCOSE, CAPILLARY
Glucose-Capillary: 98 mg/dL (ref 70–99)
Glucose-Capillary: 69 mg/dL — ABNORMAL LOW (ref 70–99)

## 2013-06-14 LAB — CK: Total CK: 359 U/L — ABNORMAL HIGH (ref 7–232)

## 2013-06-14 LAB — LACTIC ACID, PLASMA: Lactic Acid, Venous: 0.9 mmol/L (ref 0.5–2.2)

## 2013-06-14 MED ORDER — DIVALPROEX SODIUM 250 MG PO DR TAB
DELAYED_RELEASE_TABLET | ORAL | Status: AC
  Filled 2013-06-14: qty 1

## 2013-06-14 MED ORDER — SODIUM CHLORIDE 0.9 % IV SOLN
Freq: Once | INTRAVENOUS | Status: AC
  Administered 2013-06-13: 1000 mL via INTRAVENOUS

## 2013-06-14 MED ORDER — LABETALOL HCL 200 MG PO TABS
100.0000 mg | ORAL_TABLET | Freq: Two times a day (BID) | ORAL | Status: DC
  Administered 2013-06-15 – 2013-06-16 (×3): 100 mg via ORAL
  Administered 2013-06-17: 200 mg via ORAL
  Administered 2013-06-17 – 2013-06-19 (×4): 100 mg via ORAL
  Filled 2013-06-14 (×14): qty 1

## 2013-06-14 MED ORDER — LEVOFLOXACIN 250 MG PO TABS
250.0000 mg | ORAL_TABLET | Freq: Every day | ORAL | Status: DC
  Administered 2013-06-15 – 2013-06-19 (×5): 250 mg via ORAL
  Filled 2013-06-14 (×4): qty 1

## 2013-06-14 MED ORDER — LEVOFLOXACIN 500 MG PO TABS
500.0000 mg | ORAL_TABLET | Freq: Once | ORAL | Status: AC
  Administered 2013-06-14: 500 mg via ORAL
  Filled 2013-06-14: qty 1

## 2013-06-14 MED ORDER — DEXTROSE 5 % IV SOLN
1.0000 g | INTRAVENOUS | Status: DC
  Administered 2013-06-14: 1 g via INTRAVENOUS
  Filled 2013-06-14: qty 10

## 2013-06-14 MED ORDER — SULFAMETHOXAZOLE-TMP DS 800-160 MG PO TABS
1.0000 | ORAL_TABLET | Freq: Two times a day (BID) | ORAL | Status: DC
  Administered 2013-06-14: 1 via ORAL
  Filled 2013-06-14: qty 1

## 2013-06-14 MED ORDER — RAMIPRIL 2.5 MG PO CAPS
5.0000 mg | ORAL_CAPSULE | Freq: Every day | ORAL | Status: DC
  Administered 2013-06-15 – 2013-06-19 (×5): 5 mg via ORAL
  Filled 2013-06-14 (×2): qty 2
  Filled 2013-06-14: qty 1
  Filled 2013-06-14 (×3): qty 2
  Filled 2013-06-14: qty 1

## 2013-06-14 MED ORDER — SODIUM CHLORIDE 0.9 % IV BOLUS (SEPSIS)
1000.0000 mL | Freq: Once | INTRAVENOUS | Status: AC
  Administered 2013-06-14: 1000 mL via INTRAVENOUS

## 2013-06-14 MED ORDER — DEXTROSE 5 % IV SOLN
500.0000 mg | Freq: Once | INTRAVENOUS | Status: AC
  Administered 2013-06-14: 500 mg via INTRAVENOUS

## 2013-06-14 NOTE — ED Provider Notes (Addendum)
Patient has had telemetry psych evaluation. She thinks that the patient does not have capacity and needs either a medicine admission for placement at a higher level of care. I will get internal medicine consult for medication management and the consideration of admission.  Juliet Rude. Rubin Payor, MD 06/14/13 1848  Has been seen by internal medicine. Recommends Levaquin. Does not think the patient needs inpatient treatment.  Juliet Rude. Rubin Payor, MD 06/14/13 2153

## 2013-06-14 NOTE — Consult Note (Signed)
Telepsych Consultation   Reason for Consult:  Altered mental status Referring Physician:  ED MD [name] is an 57 y.o. male w a history of mental illness and multiple medical condition is brought to [facility] via EMS after being found wandering outside at 2 AM. Group home staff note a change in his behaviors recently as well as a recent change in his medication from Haldol Seroquel to Risperdal. It is unclear as to why the medication was changed.       He has remained in the ED for 1 week with multiple diagnostic work ups with no change in his behavior or mental status.        [facility] was asked for Telepsych evaluation for recommendation.  Assessment: AXIS I:  Altered mental status with disorganization and incapacity with unclear etiology. AXIS II:  deferred AXIS III:   Past Medical History  Diagnosis Date  . Aortic dissection   . Hypertension   . CVA (cerebral infarction)   . Aortic dissection   . Hypertension   . CVA (cerebral infarction)   . Diabetes mellitus   . Cardiomyopathy   . Hyperglycemia   . Bipolar 1 disorder   . Hyperkalemia   . Coronary artery disease   . Hyponatremia   . Altered mental status     with psychosis   AXIS IV:  housing problems AXIS V:  11-20 some danger of hurting self or others possible OR occasionally fails to maintain minimal personal hygiene OR gross impairment in communication  Plan:  Due to patient's current status he is incapacitated with altered mental status. He has multiple medical prob  Subjective:   Derrick Butler is a 57 y.o. male patient admitted with altered mental status to the [facility] via ambulance  HPI:  After 1 week in ED and significant medical work up, this provider attempted to do Telepsych as requested. HPI Elements:   Location:  [facility]. Quality:  severe. Severity:  patient has altered mental status with incapacity . Timing:  1 week. Duration:  7 days ago with out reduction in disorganization, improved cognition or response to  treatment.. Context:  Patient has an altered mental status, is disorganizied, appears to be respnding to internal stimulation..  Past Psychiatric History: Past Medical History  Diagnosis Date  . Aortic dissection   . Hypertension   . CVA (cerebral infarction)   . Aortic dissection   . Hypertension   . CVA (cerebral infarction)   . Diabetes mellitus   . Cardiomyopathy   . Hyperglycemia   . Bipolar 1 disorder   . Hyperkalemia   . Coronary artery disease   . Hyponatremia   . Altered mental status     with psychosis    reports that he has never smoked. He does not have any smokeless tobacco history on file. He reports that he does not drink alcohol or use illicit drugs. Family History  Problem Relation Age of Onset  . Alzheimer's disease Mother   . Stroke Father   . Heart disease Father   . Bipolar disorder Father   . Bipolar disorder Sister   . Bipolar disorder Sister    Family History Substance Abuse: No Family Supports: No (None reported ) Living Arrangements: Other (Comment) (Resides: [facility] ) Can pt return to current living arrangement?: Yes Allergies:  No Known Allergies  ACT Assessment Complete:  Yes:    Educational Status    Risk to Self: Risk to self Suicidal Ideation: No  Suicidal Intent: No Is patient at risk for suicide?: No Suicidal Plan?: No Access to Means: No What has been your use of drugs/alcohol within the last 12 months?: Pt denies  Previous Attempts/Gestures: No How many times?: 0 Other Self Harm Risks: None  Triggers for Past Attempts: None known Intentional Self Injurious Behavior: None Family Suicide History: No Recent stressful life event(s): Other (Comment) (Medication changes since 06/02/13) Persecutory voices/beliefs?: No Depression: No Depression Symptoms:  (None reported ) Substance abuse history and/or treatment for substance abuse?: No Suicide prevention information given to non-admitted patients: Not applicable   Risk to Others: Risk to Others Homicidal Ideation: No Thoughts of Harm to Others: No Current Homicidal Intent: No Current Homicidal Plan: No Access to Homicidal Means: No Identified Victim: None  History of harm to others?: No Assessment of Violence: None Noted Violent Behavior Description: None  Does patient have access to weapons?: No Criminal Charges Pending?: No Does patient have a court date: No  Abuse: Abuse/Neglect Assessment (Assessment to be complete while patient is alone) Physical Abuse: Denies Verbal Abuse: Denies Sexual Abuse: Denies Exploitation of patient/patient's resources: Denies Self-Neglect: Denies  Prior Inpatient Therapy: Prior Inpatient Therapy Prior Inpatient Therapy: Yes Prior Therapy Dates: 2008 Prior Therapy Facilty/Provider(s): [facility]  Reason for Treatment: Bipolar D/O   Prior Outpatient Therapy: Prior Outpatient Therapy Prior Outpatient Therapy: Yes Prior Therapy Dates: Current  Prior Therapy Facilty/Provider([facility]): [facility]  Reason for Treatment: Med Mgt   Additional Information: Additional Information 1:1 In Past 12 Months?: No CIRT Risk: No Elopement Risk: No Does patient have medical clearance?: Yes   Objective: Blood pressure 143/64, pulse 65, temperature 98 F (36.7 C), temperature source Oral, resp. rate 14, SpO2 95.00%.There is no weight on file to calculate BMI. Results for orders placed during the hospital encounter of 06/07/13 (from the past 72 hour(s))  GLUCOSE, CAPILLARY     Status: None   Collection Time    06/11/13  5:49 PM      Result Value Range   Glucose-Capillary 75  70 - 99 mg/dL  GLUCOSE, CAPILLARY     Status: None   Collection Time    06/11/13 10:13 PM      Result Value Range   Glucose-Capillary 94  70 - 99 mg/dL  GLUCOSE, CAPILLARY     Status: None   Collection Time    06/12/13  7:05 AM      Result Value Range   Glucose-Capillary 95  70 - 99 mg/dL  GLUCOSE, CAPILLARY     Status: None   Collection Time     06/12/13 11:47 AM      Result Value Range   Glucose-Capillary 98  70 - 99 mg/dL   Comment 1 Documented in Chart     Comment 2 Notify RN    GLUCOSE, CAPILLARY     Status: Abnormal   Collection Time    06/12/13  1:14 PM      Result Value Range   Glucose-Capillary 158 (*) 70 - 99 mg/dL  GLUCOSE, CAPILLARY     Status: None   Collection Time    06/12/13  5:09 PM      Result Value Range   Glucose-Capillary 83  70 - 99 mg/dL  CBC     Status: Abnormal   Collection Time    06/12/13  8:43 PM      Result Value Range   WBC 5.1  4.0 - 10.5 K/uL   RBC 4.17 (*) 4.22 - 5.81 MIL/uL  Hemoglobin 12.9 (*) 13.0 - 17.0 g/dL   HCT 45.4 (*) 09.8 - 11.9 %   MCV 89.7  78.0 - 100.0 fL   MCH 30.9  26.0 - 34.0 pg   MCHC 34.5  30.0 - 36.0 g/dL   RDW 14.7  82.9 - 56.2 %   Platelets 107 (*) 150 - 400 K/uL   Comment: PLATELETS APPEAR DECREASED     PLATELET COUNT CONFIRMED BY SMEAR  COMPREHENSIVE METABOLIC PANEL     Status: Abnormal   Collection Time    06/12/13  8:43 PM      Result Value Range   Sodium 137  135 - 145 mEq/L   Potassium 4.1  3.5 - 5.1 mEq/L   Chloride 101  96 - 112 mEq/L   CO2 24  19 - 32 mEq/L   Glucose, Bld 166 (*) 70 - 99 mg/dL   BUN 59 (*) 6 - 23 mg/dL   Creatinine, Ser 1.30 (*) 0.50 - 1.35 mg/dL   Calcium 9.0  8.4 - 86.5 mg/dL   Total Protein 6.4  6.0 - 8.3 g/dL   Albumin 3.1 (*) 3.5 - 5.2 g/dL   AST 16  0 - 37 U/L   ALT 10  0 - 53 U/L   Alkaline Phosphatase 52  39 - 117 U/L   Total Bilirubin 0.2 (*) 0.3 - 1.2 mg/dL   GFR calc non Af Amer 25 (*) >90 mL/min   GFR calc Af Amer 29 (*) >90 mL/min   Comment: (NOTE)     The eGFR has been calculated using the CKD EPI equation.     This calculation has not been validated in all clinical situations.     eGFR's persistently <90 mL/min signify possible Chronic Kidney     Disease.  GLUCOSE, CAPILLARY     Status: Abnormal   Collection Time    06/12/13  9:48 PM      Result Value Range   Glucose-Capillary 134 (*) 70 - 99 mg/dL   GLUCOSE, CAPILLARY     Status: Abnormal   Collection Time    06/13/13  7:42 AM      Result Value Range   Glucose-Capillary 105 (*) 70 - 99 mg/dL  GLUCOSE, CAPILLARY     Status: None   Collection Time    06/13/13  4:54 PM      Result Value Range   Glucose-Capillary 92  70 - 99 mg/dL  GLUCOSE, CAPILLARY     Status: None   Collection Time    06/13/13  9:41 PM      Result Value Range   Glucose-Capillary 99  70 - 99 mg/dL   Comment 1 Notify RN    URINALYSIS, ROUTINE W REFLEX MICROSCOPIC     Status: Abnormal   Collection Time    06/13/13 11:10 PM      Result Value Range   Color, Urine YELLOW  YELLOW   APPearance HAZY (*) CLEAR   Specific Gravity, Urine 1.025  1.005 - 1.030   pH 6.0  5.0 - 8.0   Glucose, UA NEGATIVE  NEGATIVE mg/dL   Hgb urine dipstick SMALL (*) NEGATIVE   Bilirubin Urine NEGATIVE  NEGATIVE   Ketones, ur NEGATIVE  NEGATIVE mg/dL   Protein, ur NEGATIVE  NEGATIVE mg/dL   Urobilinogen, UA 0.2  0.0 - 1.0 mg/dL   Nitrite NEGATIVE  NEGATIVE   Leukocytes, UA MODERATE (*) NEGATIVE  URINE MICROSCOPIC-ADD ON     Status: Abnormal   Collection Time  06/13/13 11:10 PM      Result Value Range   WBC, UA TOO NUMEROUS TO COUNT  <3 WBC/hpf   RBC / HPF 3-6  <3 RBC/hpf   Bacteria, UA FEW (*) RARE  CBC WITH DIFFERENTIAL     Status: Abnormal   Collection Time    06/14/13  2:25 AM      Result Value Range   WBC 4.4  4.0 - 10.5 K/uL   RBC 3.97 (*) 4.22 - 5.81 MIL/uL   Hemoglobin 12.4 (*) 13.0 - 17.0 g/dL   HCT 16.1 (*) 09.6 - 04.5 %   MCV 89.7  78.0 - 100.0 fL   MCH 31.2  26.0 - 34.0 pg   MCHC 34.8  30.0 - 36.0 g/dL   RDW 40.9  81.1 - 91.4 %   Platelets 111 (*) 150 - 400 K/uL   Neutrophils Relative % 43  43 - 77 %   Neutro Abs 1.9  1.7 - 7.7 K/uL   Lymphocytes Relative 47 (*) 12 - 46 %   Lymphs Abs 2.1  0.7 - 4.0 K/uL   Monocytes Relative 6  3 - 12 %   Monocytes Absolute 0.3  0.1 - 1.0 K/uL   Eosinophils Relative 4  0 - 5 %   Eosinophils Absolute 0.2  0.0 - 0.7 K/uL    Basophils Relative 0  0 - 1 %   Basophils Absolute 0.0  0.0 - 0.1 K/uL   Smear Review SPECIMEN CHECKED FOR CLOTS     Comment: PLATELET COUNT CONFIRMED BY SMEAR  COMPREHENSIVE METABOLIC PANEL     Status: Abnormal   Collection Time    06/14/13  2:25 AM      Result Value Range   Sodium 139  135 - 145 mEq/L   Potassium 4.1  3.5 - 5.1 mEq/L   Chloride 105  96 - 112 mEq/L   CO2 24  19 - 32 mEq/L   Glucose, Bld 97  70 - 99 mg/dL   BUN 44 (*) 6 - 23 mg/dL   Creatinine, Ser 7.82 (*) 0.50 - 1.35 mg/dL   Calcium 8.8  8.4 - 95.6 mg/dL   Total Protein 6.2  6.0 - 8.3 g/dL   Albumin 3.0 (*) 3.5 - 5.2 g/dL   AST 16  0 - 37 U/L   ALT 10  0 - 53 U/L   Alkaline Phosphatase 51  39 - 117 U/L   Total Bilirubin 0.3  0.3 - 1.2 mg/dL   GFR calc non Af Amer 34 (*) >90 mL/min   GFR calc Af Amer 40 (*) >90 mL/min   Comment: (NOTE)     The eGFR has been calculated using the CKD EPI equation.     This calculation has not been validated in all clinical situations.     eGFR's persistently <90 mL/min signify possible Chronic Kidney     Disease.  LACTIC ACID, PLASMA     Status: None   Collection Time    06/14/13  2:25 AM      Result Value Range   Lactic Acid, Venous 0.9  0.5 - 2.2 mmol/L  VALPROIC ACID LEVEL     Status: Abnormal   Collection Time    06/14/13  2:25 AM      Result Value Range   Valproic Acid Lvl 39.0 (*) 50.0 - 100.0 ug/mL  AMMONIA     Status: None   Collection Time    06/14/13  2:25 AM  Result Value Range   Ammonia 14  11 - 60 umol/L  CK     Status: Abnormal   Collection Time    06/14/13  2:25 AM      Result Value Range   Total CK 359 (*) 7 - 232 U/L  GLUCOSE, CAPILLARY     Status: Abnormal   Collection Time    06/14/13  7:03 AM      Result Value Range   Glucose-Capillary 125 (*) 70 - 99 mg/dL  GLUCOSE, CAPILLARY     Status: None   Collection Time    06/14/13 12:26 PM      Result Value Range   Glucose-Capillary 98  70 - 99 mg/dL   Labs are reviewed and are pertinent for  Chronic kidney disease, diabetes, .  Current Facility-Administered Medications  Medication Dose Route Frequency Provider Last Rate Last Dose  . 0.9 %  sodium chloride infusion   Intravenous Once Vida Roller, MD      . aspirin tablet 325 mg  325 mg Oral Daily Ward Givens, MD   325 mg at 06/14/13 1049  . benztropine (COGENTIN) tablet 1 mg  1 mg Oral TID Ward Givens, MD   1 mg at 06/14/13 1619  . cloNIDine (CATAPRES) tablet 0.3 mg  0.3 mg Oral Daily Ward Givens, MD   0.3 mg at 06/14/13 1049  . divalproex (DEPAKOTE) DR tablet 250 mg  250 mg Oral TID Ward Givens, MD   250 mg at 06/14/13 1619  . fluticasone (FLONASE) 50 MCG/ACT nasal spray 2 spray  2 spray Each Nare Daily Ward Givens, MD   2 spray at 06/14/13 1050  . glyBURIDE (DIABETA) tablet 5 mg  5 mg Oral Q breakfast Ward Givens, MD   5 mg at 06/14/13 0847  . labetalol (NORMODYNE) tablet 200 mg  200 mg Oral BID Ward Givens, MD   200 mg at 06/14/13 1050  . ramipril (ALTACE) capsule 10 mg  10 mg Oral Daily Ward Givens, MD   10 mg at 06/14/13 1050  . risperiDONE (RISPERDAL) tablet 1 mg  1 mg Oral TID Ward Givens, MD   1 mg at 06/14/13 1619  . simvastatin (ZOCOR) tablet 40 mg  40 mg Oral QHS Ward Givens, MD   40 mg at 06/12/13 2158  . sulfamethoxazole-trimethoprim (BACTRIM DS) 800-160 MG per tablet 1 tablet  1 tablet Oral Q12H Glynn Octave, MD   1 tablet at 06/14/13 1050  . temazepam (RESTORIL) capsule 15 mg  15 mg Oral QHS Ward Givens, MD   15 mg at 06/12/13 2159   Current Outpatient Prescriptions  Medication Sig Dispense Refill  . aspirin 325 MG tablet Take 325 mg by mouth daily.        . benztropine (COGENTIN) 1 MG tablet Take 1 mg by mouth 3 (three) times daily.      . cloNIDine (CATAPRES) 0.3 MG tablet Take 0.3 mg by mouth daily.        . divalproex (DEPAKOTE) 250 MG DR tablet Take 250 mg by mouth 3 (three) times daily.       . fluticasone (FLONASE) 50 MCG/ACT nasal spray Place 2 sprays into the nose daily.      [name] glyBURIDE (DIABETA) 5  MG tablet Take 5 mg by mouth daily with breakfast.        . labetalol (NORMODYNE) 200 MG tablet Take 200 mg by mouth 2 (two) times  daily.        . potassium chloride (KLOR-CON) 20 MEQ packet Take 20 mEq by mouth daily.        . ramipril (ALTACE) 10 MG tablet Take 10 mg by mouth daily.        . risperiDONE (RISPERDAL) 1 MG tablet Take 1 tablet (1 mg total) by mouth 3 (three) times daily.  90 tablet  2  . simvastatin (ZOCOR) 40 MG tablet Take 40 mg by mouth at bedtime.        . temazepam (RESTORIL) 15 MG capsule Take 15 mg by mouth at bedtime.          Psychiatric Specialty Exam:     Blood pressure 143/64, pulse 65, temperature 98 F (36.7 C), temperature source Oral, resp. rate 14, SpO2 95.00%.There is no weight on file to calculate BMI.  General Appearance: Bizarre  Eye Contact::  Minimal  Speech:  Garbled  Volume:  Normal  Mood:  Anxious  Affect:  Full Range  Thought Process:  Disorganized  Orientation:  NA  Thought Content:  patient unable to respond verbally but appears to be responding to internal stimulation. He is reaching for things that are not in the room.   Suicidal Thoughts:  UTA  Homicidal Thoughts:  UTA  Memory:  UTA  Judgement:  Impaired  Insight:  Lacking  Psychomotor Activity:  Increased  Concentration:  NA  Recall:  NA  Akathisia:  No  Handed:  Right  AIMS (if indicated):     Assets:    Sleep:      Treatment Plan Summary: This patient has an altered mental status with disorganized thinking. He appears to be cognitively impaired and does not have the capacity to make decisions at this time.  He is at risk for accidental self harm, possibly a risk to others due to his diminished cognition. He will need a higher level of care at this time. I recommend a higher level of care for this patient such as a nursing home, or skilled nursing home due to his diminished capacity. Disposition: Disposition Initial Assessment Completed for this Encounter: Yes Disposition of  Patient: Referred to;Inpatient treatment program [facility] ) Type of inpatient treatment program: Adult Patient referred to: Other (Comment) ([name]) Lloyd Huger T. Mashburn RPAC 5:49 PM 06/14/2013  Reviewed notes and agree with the plan.

## 2013-06-14 NOTE — [facility] Counselor (Signed)
Writer informed Licensed conveyancer to call [facility] when the pt wakes to be re-assessed. Denice Bors, [facility] 06/14/2013 8:02 AM

## 2013-06-14 NOTE — ED Notes (Signed)
Pt will not eat any of supper tray. Will not his mouth and turns his head away when offered food. Pt offered broth and pudding but still will not.

## 2013-06-14 NOTE — ED Notes (Signed)
[phone] called with no answer. Rita from [facility] called earlier and stated for [place] to call back when pt able to talk. Pt was eating earlier.

## 2013-06-14 NOTE — [facility] Counselor (Signed)
This Clinical research associate re-assessed pt this AM and pt is remains the same with no improvement.  Pt is confused(told this Clinical research associate it was Thursday, unsure of where he is), unable to concentrate(pt had to be re-directed to look tele-assessment) and mumbling.  This Clinical research associate asked pt if he was feeling better, he stated that he was in pain.  Per nurse,(christy edwards) pt has intermittently soiled himself and has to be assisted to the bathroom, he has an unsteady gait.  This Clinical research associate consulted with nurse and Dr. Gwendolyn Grant, this pt has been in emerg dept for 6 days and it appears(with psych meds) that pt is at baseline. Per Dr. Gwendolyn Grant, he will consult with medicine to review for possible inpt admission as pt continues with UTI(taking bactrim) and then seek placement with a nursing home for more concentrated care. However, this Clinical research associate will ask for additional consult from extender/psych.  If pt is admitted to med floor then a social work consult will be needed to pursue nursing home placement.

## 2013-06-14 NOTE — ED Notes (Signed)
[phone] and [phone] both called again with no answer.

## 2013-06-14 NOTE — Consult Note (Signed)
[facility] Medical Consultation  GAGAN DILLION ZOX:[id number] DOB: 06-03-1956 DOA: 06/07/2013 ZACHAREY JENSEN is an 57 y.o. male.    PCP: Fredirick Maudlin, MD     Requesting physician: Dr. Rubin Payor Date of consultation: 06/14/2013 Reason for consultation: Abnormal chest x-ray  Chief Complaint: Agitation  HPI: This is a 57 year old, Caucasian male, with a history of bipolar disorder, who lives in the group home. He was brought into the emergency department on October 4 due to unusual behavior. Apparently, he was taken off of his Haldol and Seroquel recently and was started on Risperdal at the group home. He had then subsequently started walking in to the street, talking to himself and to other random objects in the room. He was brought into the ED, and underwent psychiatric evaluation. It was determined that his presentation was due to psychiatric illness and that plan was for placement to a psychiatric facility. However, he has been in the emergency department for the past 7 days awaiting placement. During this time he was detected to have an abnormal UA for which he was initially started on Bactrim. Cultures came back negative and so, Bactrim was discontinued. According to the nurse and the physicians taking care of the patient, his mental status hasn't really changed much. He does have periods when he is quite agitated, but then at other times he is calm. It has been noticed that he has had a poor oral intake in the last many days. And this is predominantly due to to his psychiatric illness. He also had a period of hypotension 2 days ago, which resolved with IV fluids. Hypotension was thought to be predominantly due to poor oral intake. He also has chronic kidney disease, but his creatinine has been stable at his baseline. He also had a CT head which showed remote strokes without any acute findings. Patient is somewhat agitated during my evaluation. He didn't know where he was. He wouldn't  answer any of my questions. History from the patient is limited. As part of workup for decrease in the level of mental status this morning. He underwent a chest x-ray, which raised a concern for a possible pneumonia in the left base. However, there has been no cough. Patient has been afebrile. His white blood cell count is normal. His lactic acid level was normal. He also had abnormal, UA, yesterday, for which he was started back on Bactrim.  Current Medications:  Scheduled: . aspirin  325 mg Oral Daily  . benztropine  1 mg Oral TID  . cloNIDine  0.3 mg Oral Daily  . divalproex  250 mg Oral TID  . fluticasone  2 spray Each Nare Daily  . glyBURIDE  5 mg Oral Q breakfast  . labetalol  200 mg Oral BID  . levofloxacin  500 mg Oral Once   Followed by  . [START ON 06/15/2013] levofloxacin  250 mg Oral Daily  . ramipril  10 mg Oral Daily  . risperiDONE  1 mg Oral TID  . simvastatin  40 mg Oral QHS  . sulfamethoxazole-trimethoprim  1 tablet Oral Q12H  . temazepam  15 mg Oral QHS   Continuous: . sodium chloride      Home Medications: Prior to Admission medications   Medication Sig Start Date End Date Taking? Authorizing Provider  aspirin 325 MG tablet Take 325 mg by mouth daily.     Yes Historical Provider, MD  benztropine (COGENTIN) 1 MG tablet Take 1 mg by mouth 3 (three) times daily.  Yes Historical Provider, MD  cloNIDine (CATAPRES) 0.3 MG tablet Take 0.3 mg by mouth daily.     Yes Historical Provider, MD  divalproex (DEPAKOTE) 250 MG DR tablet Take 250 mg by mouth 3 (three) times daily.    Yes Historical Provider, MD  fluticasone (FLONASE) 50 MCG/ACT nasal spray Place 2 sprays into the nose daily.   Yes Historical Provider, MD  glyBURIDE (DIABETA) 5 MG tablet Take 5 mg by mouth daily with breakfast.     Yes Historical Provider, MD  labetalol (NORMODYNE) 200 MG tablet Take 200 mg by mouth 2 (two) times daily.     Yes Historical Provider, MD  potassium chloride (KLOR-CON) 20 MEQ packet  Take 20 mEq by mouth daily.     Yes Historical Provider, MD  ramipril (ALTACE) 10 MG tablet Take 10 mg by mouth daily.     Yes Historical Provider, MD  risperiDONE (RISPERDAL) 1 MG tablet Take 1 tablet (1 mg total) by mouth 3 (three) times daily. 06/05/13 06/05/14 Yes Diannia Ruder, MD  simvastatin (ZOCOR) 40 MG tablet Take 40 mg by mouth at bedtime.     Yes Historical Provider, MD  temazepam (RESTORIL) 15 MG capsule Take 15 mg by mouth at bedtime.     Yes Historical Provider, MD   . Allergies: No Known Allergies  Past Medical History: Past Medical History  Diagnosis Date  . Aortic dissection   . Hypertension   . CVA (cerebral infarction)   . Aortic dissection   . Hypertension   . CVA (cerebral infarction)   . Diabetes mellitus   . Cardiomyopathy   . Hyperglycemia   . Bipolar 1 disorder   . Hyperkalemia   . Coronary artery disease   . Hyponatremia   . Altered mental status     with psychosis    Past Surgical History  Procedure Laterality Date  . Ascending aortic aneurysm repair      Social History:   reports that he has never smoked. He does not have any smokeless tobacco history on file. He reports that he does not drink alcohol or use illicit drugs.  Living Situation: Used to live in a group home Activity Level: Was independent with daily activities   Family History:  Family History  Problem Relation Age of Onset  . Alzheimer's disease Mother   . Stroke Father   . Heart disease Father   . Bipolar disorder Father   . Bipolar disorder Sister   . Bipolar disorder Sister      Review of Systems - unable to do due to her psychiatric illness  Physical Examination: Filed Vitals:   06/14/13 0709 06/14/13 0710 06/14/13 1043 06/14/13 1739  BP: 132/75 123/68 143/64 115/62  Pulse: 65 52 65 59  Temp:      TempSrc:      Resp:   14 16  SpO2:   95% 98%    General appearance: alert, combative, distracted and no distress Head: Normocephalic, without obvious abnormality,  atraumatic Eyes: conjunctivae/corneas clear. PERRL, EOM's intact.  Back: symmetric, no curvature. ROM normal. No CVA tenderness. Resp: Decreased air entry at the left base of it may be a few crackles. No rhonchi. No wheezing Cardio: regular rate and rhythm, S1, S2 normal, no murmur, click, rub or gallop GI: soft, non-tender; bowel sounds normal; no masses,  no organomegaly Extremities: extremities normal, atraumatic, no cyanosis or edema Pulses: 2+ and symmetric Skin: Skin color, texture, turgor normal. No rashes or lesions Neurologic: He is confused,  somewhat agitated. Pressure speech. Moving all his extremities. No cranial nerve deficits noted. No focal, motor deficits noted. Gait not assessed  Laboratory Data: Results for orders placed during the hospital encounter of 06/07/13 (from the past 48 hour(s))  CBC     Status: Abnormal   Collection Time    06/12/13  8:43 PM      Result Value Range   WBC 5.1  4.0 - 10.5 K/uL   RBC 4.17 (*) 4.22 - 5.81 MIL/uL   Hemoglobin 12.9 (*) 13.0 - 17.0 g/dL   HCT 16.1 (*) 09.6 - 04.5 %   MCV 89.7  78.0 - 100.0 fL   MCH 30.9  26.0 - 34.0 pg   MCHC 34.5  30.0 - 36.0 g/dL   RDW 40.9  81.1 - 91.4 %   Platelets 107 (*) 150 - 400 K/uL   Comment: PLATELETS APPEAR DECREASED     PLATELET COUNT CONFIRMED BY SMEAR  COMPREHENSIVE METABOLIC PANEL     Status: Abnormal   Collection Time    06/12/13  8:43 PM      Result Value Range   Sodium 137  135 - 145 mEq/L   Potassium 4.1  3.5 - 5.1 mEq/L   Chloride 101  96 - 112 mEq/L   CO2 24  19 - 32 mEq/L   Glucose, Bld 166 (*) 70 - 99 mg/dL   BUN 59 (*) 6 - 23 mg/dL   Creatinine, Ser 7.82 (*) 0.50 - 1.35 mg/dL   Calcium 9.0  8.4 - 95.6 mg/dL   Total Protein 6.4  6.0 - 8.3 g/dL   Albumin 3.1 (*) 3.5 - 5.2 g/dL   AST 16  0 - 37 U/L   ALT 10  0 - 53 U/L   Alkaline Phosphatase 52  39 - 117 U/L   Total Bilirubin 0.2 (*) 0.3 - 1.2 mg/dL   GFR calc non Af Amer 25 (*) >90 mL/min   GFR calc Af Amer 29 (*) >90 mL/min    Comment: (NOTE)     The eGFR has been calculated using the CKD EPI equation.     This calculation has not been validated in all clinical situations.     eGFR's persistently <90 mL/min signify possible Chronic Kidney     Disease.  GLUCOSE, CAPILLARY     Status: Abnormal   Collection Time    06/12/13  9:48 PM      Result Value Range   Glucose-Capillary 134 (*) 70 - 99 mg/dL  GLUCOSE, CAPILLARY     Status: Abnormal   Collection Time    06/13/13  7:42 AM      Result Value Range   Glucose-Capillary 105 (*) 70 - 99 mg/dL  GLUCOSE, CAPILLARY     Status: None   Collection Time    06/13/13  4:54 PM      Result Value Range   Glucose-Capillary 92  70 - 99 mg/dL  GLUCOSE, CAPILLARY     Status: None   Collection Time    06/13/13  9:41 PM      Result Value Range   Glucose-Capillary 99  70 - 99 mg/dL   Comment 1 Notify RN    URINALYSIS, ROUTINE W REFLEX MICROSCOPIC     Status: Abnormal   Collection Time    06/13/13 11:10 PM      Result Value Range   Color, Urine YELLOW  YELLOW   APPearance HAZY (*) CLEAR   Specific Gravity, Urine 1.025  1.005 -  1.030   pH 6.0  5.0 - 8.0   Glucose, UA NEGATIVE  NEGATIVE mg/dL   Hgb urine dipstick SMALL (*) NEGATIVE   Bilirubin Urine NEGATIVE  NEGATIVE   Ketones, ur NEGATIVE  NEGATIVE mg/dL   Protein, ur NEGATIVE  NEGATIVE mg/dL   Urobilinogen, UA 0.2  0.0 - 1.0 mg/dL   Nitrite NEGATIVE  NEGATIVE   Leukocytes, UA MODERATE (*) NEGATIVE  URINE MICROSCOPIC-ADD ON     Status: Abnormal   Collection Time    06/13/13 11:10 PM      Result Value Range   WBC, UA TOO NUMEROUS TO COUNT  <3 WBC/hpf   RBC / HPF 3-6  <3 RBC/hpf   Bacteria, UA FEW (*) RARE  CBC WITH DIFFERENTIAL     Status: Abnormal   Collection Time    06/14/13  2:25 AM      Result Value Range   WBC 4.4  4.0 - 10.5 K/uL   RBC 3.97 (*) 4.22 - 5.81 MIL/uL   Hemoglobin 12.4 (*) 13.0 - 17.0 g/dL   HCT 96.0 (*) 45.4 - 09.8 %   MCV 89.7  78.0 - 100.0 fL   MCH 31.2  26.0 - 34.0 pg   MCHC 34.8   30.0 - 36.0 g/dL   RDW 11.9  14.7 - 82.9 %   Platelets 111 (*) 150 - 400 K/uL   Neutrophils Relative % 43  43 - 77 %   Neutro Abs 1.9  1.7 - 7.7 K/uL   Lymphocytes Relative 47 (*) 12 - 46 %   Lymphs Abs 2.1  0.7 - 4.0 K/uL   Monocytes Relative 6  3 - 12 %   Monocytes Absolute 0.3  0.1 - 1.0 K/uL   Eosinophils Relative 4  0 - 5 %   Eosinophils Absolute 0.2  0.0 - 0.7 K/uL   Basophils Relative 0  0 - 1 %   Basophils Absolute 0.0  0.0 - 0.1 K/uL   Smear Review SPECIMEN CHECKED FOR CLOTS     Comment: PLATELET COUNT CONFIRMED BY SMEAR  COMPREHENSIVE METABOLIC PANEL     Status: Abnormal   Collection Time    06/14/13  2:25 AM      Result Value Range   Sodium 139  135 - 145 mEq/L   Potassium 4.1  3.5 - 5.1 mEq/L   Chloride 105  96 - 112 mEq/L   CO2 24  19 - 32 mEq/L   Glucose, Bld 97  70 - 99 mg/dL   BUN 44 (*) 6 - 23 mg/dL   Creatinine, Ser 5.62 (*) 0.50 - 1.35 mg/dL   Calcium 8.8  8.4 - 13.0 mg/dL   Total Protein 6.2  6.0 - 8.3 g/dL   Albumin 3.0 (*) 3.5 - 5.2 g/dL   AST 16  0 - 37 U/L   ALT 10  0 - 53 U/L   Alkaline Phosphatase 51  39 - 117 U/L   Total Bilirubin 0.3  0.3 - 1.2 mg/dL   GFR calc non Af Amer 34 (*) >90 mL/min   GFR calc Af Amer 40 (*) >90 mL/min   Comment: (NOTE)     The eGFR has been calculated using the CKD EPI equation.     This calculation has not been validated in all clinical situations.     eGFR's persistently <90 mL/min signify possible Chronic Kidney     Disease.  LACTIC ACID, PLASMA     Status: None   Collection Time  06/14/13  2:25 AM      Result Value Range   Lactic Acid, Venous 0.9  0.5 - 2.2 mmol/L  VALPROIC ACID LEVEL     Status: Abnormal   Collection Time    06/14/13  2:25 AM      Result Value Range   Valproic Acid Lvl 39.0 (*) 50.0 - 100.0 ug/mL  AMMONIA     Status: None   Collection Time    06/14/13  2:25 AM      Result Value Range   Ammonia 14  11 - 60 umol/L  CK     Status: Abnormal   Collection Time    06/14/13  2:25 AM       Result Value Range   Total CK 359 (*) 7 - 232 U/L  GLUCOSE, CAPILLARY     Status: Abnormal   Collection Time    06/14/13  7:03 AM      Result Value Range   Glucose-Capillary 125 (*) 70 - 99 mg/dL  GLUCOSE, CAPILLARY     Status: None   Collection Time    06/14/13 12:26 PM      Result Value Range   Glucose-Capillary 98  70 - 99 mg/dL  GLUCOSE, CAPILLARY     Status: Abnormal   Collection Time    06/14/13  5:37 PM      Result Value Range   Glucose-Capillary 69 (*) 70 - 99 mg/dL    Imaging Studies: Dg Chest 1 View  06/14/2013   *RADIOLOGY REPORT*  Clinical Data: Weakness, tremors, confusion and posterior  CHEST - 1 VIEW  Comparison: Chest radiograph performed 06/10/2013  Findings: The left lung is significantly hypoexpanded.  Left-sided airspace opacity raises concern for pneumonia, given the patient's symptoms.  Minimal right basilar opacity is also seen.  A small left pleural effusion cannot be excluded.  No pneumothorax is seen.  The cardiomediastinal silhouette is borderline normal in size; the patient is status post median sternotomy.  No acute osseous abnormalities are seen.  IMPRESSION: Left-sided airspace opacification raises concern for pneumonia, given the patient's symptoms; minimal right basilar airspace opacity also seen.  Possible small left pleural effusion.   Original Report Authenticated By: Tonia Ghent, M.D.   Ct Head Wo Contrast  06/14/2013   *RADIOLOGY REPORT*  Clinical Data: Altered mental status, history of cerebrovascular accident  CT HEAD WITHOUT CONTRAST  Technique:  Contiguous axial images were obtained from the base of the skull through the vertex without contrast.  Comparison: None.  Findings: Encephalomalacia within the right parietal and occipital lobes is present, most compatible with remote right PCA infarct. There may be some right MCA involvement as well.  There is associated ex vacuo dilatation of the right lateral ventricle.  Additional remote lacunar  infarct is present within the left basal ganglia with superior extension into the left periventricular white matter.  No acute intracranial hemorrhage or infarct is identified.  No midline shift or mass lesion.  There is no extra-axial fluid collection.  Orbital soft tissues are within normal limits.  Prominent calcified atheromatous disease is present within the visualized vertebral arteries.  Calvarium is intact.  Paranasal sinuses and mastoid air cells are clear.  IMPRESSION: Remote right parieto-occipital ischemic infarct and chronic lacunar infarct within the left basal ganglia.  No acute intracranial process identified.   Original Report Authenticated By: Rise Mu, M.D.   Impression/Recommendations  Active Problems:   Bipolar I disorder, most recent episode mixed   Pneumonia   This  is a 57 year old, Caucasian male, who has a history of bipolar disorder, and used to live in a group home. Medication changes probably prompted his aggressive behavior about 7 days ago, which was the reason for his presentation to the ED initially. Patient's mental status has been waxing and waning.   #1 Abnormal chest x-ray: Chest x-ray from October 7 was also reviewed. There was a concern about a left-sided pleural effusion at that time as well. Current x-ray is not optimal. He is rotated. It's quite possible that this finding may have been present since his initial presentation to the emergency department. He, however, remains afebrile. He's not hypoxic. He doesn't have a cough. He doesn't appear to be short of breath. His respiratory rate is normal except when he is agitated. I do not think that this is a healthcare associated pneumonia. But however, I do think that he probably may benefit from treatment. So, I will recommend Levaquin adjusted for renal function, which I will order. Would recommend repeating his chest x-ray in 4-6 weeks unless he starts getting worse in which case it could be repeated  earlier. I do not think that this is reason enough for him to be admitted to inpatient setting. I do not think this is causing his mental status changes. Patient ideally requires a facility where predominantly his psychiatric disease can be managed along with some medical input. Check EKG, to screen for a prolonged QT as he'll be placed on Levaquin.  #2 history of diabetes mellitus type II: He is on glyburide. His blood sugar has been running on the lower side. He's had the poor oral intake. So, to avoid the drop in blood sugars I will discontinue his glyburide. Continue to monitor his CBCs closely.  #3 history of hypertension: Blood pressure is running well. He has had one or 2 episodes of low blood pressure. We will titrate down his labetalol and his ramipril for now.  #4 history of chronic kidney disease, appears to be stage III: Creatinine has been stable. The old labs were reviewed and he appears to be at baseline. I would encourage oral intake and oral hydration for now. Monitor urine output as well.  #5 history of bipolar disorder/agitation: This management deferred to the psychiatric providers. I agree with all the medication that he is on currently. As Levaquin can cause prolonged QT along with the other psychotropic agents we will recommend repeating EKG this time.  #6 abnormal, UA: Bactrim has been stopped, as he will be initiated on Levaquin. Urine culture from the 10th is still pending. Continue to follow.  So in summary I do not feel that this patient merits inpatient medical hospitalization here at [facility]. He predominantly needs psychiatric care, along with some medical input that can be obtained periodically. I did not feel that the abnormalities noted on the chest x-ray and in the urine is contributing to his mental status changes.   Please call with any questions. I will followup on the EKG and make further recommendations if needed.   Thank you for this  consultation  [facility]  [facility] Pager [phone]  If 7PM-7AM, please contact night-coverage.  [email or web address] Password [facility]  06/14/2013, 7:57 PM

## 2013-06-14 NOTE — Progress Notes (Signed)
[facility] needs the LOCUS rating to give auth# for [facility].  [facility] was faxed a referral for their geriatric unit.  Blain Pais, MHT/NS

## 2013-06-14 NOTE — ED Notes (Signed)
Second tela psych completed for the day. Pt confused and at times attempting to climb out of bed. Picking at cover and speech is garbled

## 2013-06-14 NOTE — ED Notes (Signed)
Pt did not eat any of his lunch.

## 2013-06-14 NOTE — ED Provider Notes (Addendum)
Asked by nursing staff to reevaluate patient as he had decreased mental status and episode of hypotension that responded to fluids earlier this afternoon. He's been somnolent today with poor by mouth intake and refusing medications.  Assessment, patient is alert and awake and calm. He follows some commands. He denies any pain complaints. Abdomen is soft and nontender. Lungs are clear. Heart is regular. He moves all of his extremities spontaneously. No clonus, no rigidity.  BP 99/74  Pulse 62  Temp(Src) 98.2 F (36.8 C) (Rectal)  Resp 16  SpO2 100%  Repeat labs were obtained. Creatinine has improved and appears to be stabilized at 2. No leukocytosis. CT head is negative. Urinalysis shows what appears to be infection. Patient had negative urine culture on October 5, antibiotics were stopped then. Culture will be resent today. chest x-ray shows concern for possible pneumonia. However patient has no fever, cough or leukocytosis.  Lactic acid and ammonia levels are normal. Depakote level is not elevated. CK mildly elevated at 359.   Patient seems to be back to baseline per nursing staff. We'll continue antibiotics (bactrim) for UTI. Pneumonia seems less likely at this time. He appears to be stable for psychiatric placement.    Glynn Octave, MD 06/14/13 1610  Glynn Octave, MD 06/14/13 [phone]

## 2013-06-14 NOTE — [facility] Counselor (Deleted)
Writer received a call from Amor @ [facility] and was informed that the pt has been placed on their wait list. Denice Bors, [facility] 06/14/2013 8:00 AM

## 2013-06-14 NOTE — ED Notes (Signed)
Back from CT. Pt awake, verbal, angry. Banging hands against side rails of bed. States he's thirsty and asking for orange juice. Drank 4 oz OJ w/o difficulty. Took depakote only. Refused any other medications and became more aggressive when pushed to take meds. Dr. Lajean Saver re-eval, pt able to follow commands to squeeze hands and move feet. Speech nonsensical unless answering a simple "yes/no" question.

## 2013-06-14 NOTE — ED Notes (Signed)
Responds to noxious stimuli by withdrawing. Still no eye contact or coherent verbalizations. Having rigorous shakes when disturbed. Afebrile rectal temp.

## 2013-06-14 NOTE — ED Notes (Signed)
No unusual activity noted at this time.

## 2013-06-15 LAB — GLUCOSE, CAPILLARY
Glucose-Capillary: 88 mg/dL (ref 70–99)
Glucose-Capillary: 91 mg/dL (ref 70–99)

## 2013-06-15 LAB — ALBUMIN: Albumin: 3.1 g/dL — ABNORMAL LOW (ref 3.5–5.2)

## 2013-06-15 LAB — URINE CULTURE: Culture: NO GROWTH

## 2013-06-15 MED ORDER — LEVOFLOXACIN 250 MG PO TABS
ORAL_TABLET | ORAL | Status: AC
  Administered 2013-06-15: 250 mg via ORAL
  Filled 2013-06-15: qty 1

## 2013-06-15 MED ORDER — ASPIRIN 325 MG PO TABS
ORAL_TABLET | ORAL | Status: AC
  Filled 2013-06-15: qty 1

## 2013-06-15 MED ORDER — CLONIDINE HCL 0.1 MG PO TABS
ORAL_TABLET | ORAL | Status: AC
  Administered 2013-06-15: 0.3 mg
  Filled 2013-06-15: qty 3

## 2013-06-15 NOTE — ED Notes (Signed)
CBG of 91. Pt became combative. Reoriented pt. Pt then became cooperative.

## 2013-06-15 NOTE — [facility] Counselor (Signed)
This Clinical research associate spoke with pt.'s nurse(Elizabeth), states that a "bridge call" was completed, however [facility] placement is not needed for this pt.  Per Lanora Manis, [facility] will contact social work in the morning and begin seeking nursing home placement for this pt.  This information will also be noted on shift report.

## 2013-06-15 NOTE — ED Notes (Signed)
Spoke with Derrick Butler during bridge call. States the only option they have now is [facility].

## 2013-06-15 NOTE — ED Notes (Signed)
Sitter states pt ate all of his eggs, froot loops and drank his juice. States she had to feed him because when she sat the tray in front of him he did not know what to do with the food.

## 2013-06-15 NOTE — Progress Notes (Signed)
TTS spoke to [place] at [facility] who is a Department Head.  She requested TTS go ahead and get [facility] auth from [facility] as a back up in case SW has issues with placement.  Spoke to Clydie Braun at [facility] and got auth #  [id number] at [phone].  Documented in shift report.  LOCUS is 30.  Form faxed to [facility] at [phone]

## 2013-06-15 NOTE — ED Notes (Signed)
Unable to complete ortho vital signs.  Sitter and I were able to obtain vital signs laying down.  Patient became extremely agitated upon attempting to make him sit.  He began attempting to hit both the sitter and I.  He is also fighting [place] to the point the blood pressure machine will not read his vital signs.

## 2013-06-15 NOTE — ED Notes (Signed)
Pt not eating lunch, spitting food out when attempting to feed pt.

## 2013-06-15 NOTE — ED Provider Notes (Signed)
I was asked to reevaluate the patient as nursing is concerned that he's not eating and drinking and is declining mentally.  Patient has been here for over 185 hours. He's been declined for multiple psychiatric institutions and has been medically declined at [facility]. He was evaluated by the hospitalist yesterday and they did not feel he needed admission. Patient is morning has no complaints. Recent vital signs are reassuring. He did eat all of his breakfast.  Re-evaluation prior to the end of my shift.  Patient refused to eat lunch.  COntinues to have no complaints.  Albumin level was ordered as well as orthostatics to assess hydration status.  Shon Baton, MD 06/15/13 [phone]

## 2013-06-15 NOTE — ED Notes (Signed)
Attempted to sit pt up for supper. Pt combative and wobbly, unable to set. Linens changed on bed and pt repositioned. Pt incontinent of urine. Peri care done and adult diaper changed. Sitter attempting to feed pt.

## 2013-06-15 NOTE — Progress Notes (Signed)
Spoke with nurse to schedule a Tele assessment with patient.  TTS asked if pt was able to communicate and contribute to discussion if assessed again.  Nurse indicated pt is not able to enter into discussions, doesn't know where he is at times, isn't able to feed self, so hospital is feeding him, he isn't ambulatory and has a whole list of medical issues.  Tele Assessment was deemed not in best interest of pt at this time due to no new information being discovered.  The issues are the same and the inability of pt to contribute to assessment only reaffirms the notes already documented.  Pt has been assessed and documented on by [facility] TTS unit throughout his stay.  Dr. Gilmore Laroche recommends medical admit due to the medical issues facing patient.  Dr. Rito Ehrlich at [facility] evaluated pt and determined otherwise.  It should be noted that any local placement is not going to admit patient with these issues.  He will need to be stabilized medically on a med floor where social work can then locate an appropriate placement to care for pt needs.  Psychiatry has recommended this and the ED MD needs to reconsider with the hospitalist on this patient.  Pt is on the wait list at [facility] (this will take time due to their lengthy wait list) and per [facility] request, a [employer] worksheet was completed and sent to [facility] indicating pt needs for [facility] and [employer] needs.

## 2013-06-15 NOTE — ED Notes (Signed)
Pt resting. No distress noted. 

## 2013-06-15 NOTE — ED Notes (Signed)
Spoke with Inetta Fermo at [facility] - informed Inetta Fermo that [facility] was not appropriate for this pt.  Social work will be contacted first thing Monday morning for SNIF placement.  Inetta Fermo in agreement with plan of care.

## 2013-06-16 ENCOUNTER — Encounter (HOSPITAL_COMMUNITY): Payer: Self-pay | Admitting: *Deleted

## 2013-06-16 LAB — COMPREHENSIVE METABOLIC PANEL
Alkaline Phosphatase: 61 U/L (ref 39–117)
BUN: 35 mg/dL — ABNORMAL HIGH (ref 6–23)
CO2: 24 mEq/L (ref 19–32)
Chloride: 105 mEq/L (ref 96–112)
GFR calc Af Amer: 36 mL/min — ABNORMAL LOW (ref >90)
Glucose, Bld: 132 mg/dL — ABNORMAL HIGH (ref 70–99)
Potassium: 4.6 mEq/L (ref 3.5–5.1)
Total Bilirubin: 0.3 mg/dL (ref 0.3–1.2)

## 2013-06-16 LAB — VALPROIC ACID LEVEL: Valproic Acid Lvl: 34.2 ug/mL — ABNORMAL LOW (ref 50.0–100.0)

## 2013-06-16 LAB — CBC WITH DIFFERENTIAL/PLATELET
Basophils Absolute: 0 10*3/uL (ref 0.0–0.1)
Eosinophils Absolute: 0.2 10*3/uL (ref 0.0–0.7)
HCT: 40.5 % (ref 39.0–52.0)
Hemoglobin: 13.9 g/dL (ref 13.0–17.0)
Lymphocytes Relative: 30 % (ref 12–46)
MCHC: 34.3 g/dL (ref 30.0–36.0)
Monocytes Absolute: 0.7 10*3/uL (ref 0.1–1.0)
Monocytes Relative: 11 % (ref 3–12)
Neutro Abs: 3.5 10*3/uL (ref 1.7–7.7)
WBC: 6.3 10*3/uL (ref 4.0–10.5)

## 2013-06-16 LAB — GLUCOSE, CAPILLARY

## 2013-06-16 MED ORDER — INFLUENZA VAC SPLIT QUAD 0.5 ML IM SUSP
0.5000 mL | INTRAMUSCULAR | Status: AC
  Administered 2013-06-17: 0.5 mL via INTRAMUSCULAR
  Filled 2013-06-16: qty 0.5

## 2013-06-16 MED ORDER — DEXTROSE 5 % IV SOLN
1.0000 g | INTRAVENOUS | Status: DC
  Administered 2013-06-16 – 2013-06-18 (×3): 1 g via INTRAVENOUS
  Filled 2013-06-16 (×3): qty 10

## 2013-06-16 MED ORDER — SODIUM CHLORIDE 0.9 % IV SOLN
INTRAVENOUS | Status: DC
  Administered 2013-06-16 – 2013-06-19 (×3): via INTRAVENOUS

## 2013-06-16 MED ORDER — ENOXAPARIN SODIUM 40 MG/0.4ML ~~LOC~~ SOLN
40.0000 mg | SUBCUTANEOUS | Status: DC
  Administered 2013-06-16 – 2013-06-18 (×3): 40 mg via SUBCUTANEOUS
  Filled 2013-06-16 (×3): qty 0.4

## 2013-06-16 MED ORDER — PNEUMOCOCCAL VAC POLYVALENT 25 MCG/0.5ML IJ INJ
0.5000 mL | INJECTION | INTRAMUSCULAR | Status: AC
  Administered 2013-06-17: 0.5 mL via INTRAMUSCULAR
  Filled 2013-06-16: qty 0.5

## 2013-06-16 NOTE — H&P (Signed)
NAME:  Derrick Butler, Derrick Butler              ACCOUNT NO.:  [id number]  MEDICAL RECORD NO.:  [id number]  LOCATION:  A332                          FACILITY:  [facility]  PHYSICIAN:  Lakasha Mcfall L. Juanetta Gosling, M.D.DATE OF BIRTH:  1956/03/21  DATE OF ADMISSION:  06/07/2013 DATE OF DISCHARGE:  LH                             HISTORY & PHYSICAL   REASON FOR ADMISSION:  Altered mental status, UTI and dehydration.  HISTORY:  Mr. Derrick Butler is a 57 year old who has a known history of bipolar disorder, who has been living in a group home.  He had been switched from Haldol and Seroquel, which he had been on for sometime and then was switched to Risperdal and his behavior has worsened since then.  He was found walking in the street, talking to himself, having what seemed to be auditory hallucinations.  He was brought to the emergency department, underwent psychiatric evaluation and it was felt that he had problems with his mental status.  He has been in the emergency department now, however, for approximately 7 days and he has now stopped eating and is not drinking any fluids.  He is being treated for urinary tract infection.  He has become much more confused.  As mentioned, it had been set up for him to be sent to a psychiatric institution, but he has been unable to be sent because it is felt that he has too many medical problems, to be managed even at a psychiatric institution and has medical back up.  He does have multiple medical problems including a previous severe stroke, hypertension, coronary artery occlusive disease. He has had several psychiatric evaluations.  There is concerned that he may have pneumonia.  There is a questionable urinary tract infection.  PAST MEDICAL HISTORY:  Positive for an aortic dissection, cerebral infarction, hypertension, diabetes, cardiomyopathy, bipolar 1 disorder, coronary artery occlusive disease, and previous episodes of altered mental status with psychosis.  He has had an  ascending aortic aneurysm repair.  SOCIAL HISTORY:  He does not use any alcohol or illicit drugs.  He has been living in a group home.  He does not smoke.  FAMILY HISTORY:  Positive for Alzheimer's disease in his mother.  His father had heart disease, bipolar disease, and previous stroke.  He has a sister who has depression perhaps bipolar.  REVIEW OF SYSTEMS:  Not obtainable.  PHYSICAL EXAMINATION:  GENERAL:  He is alert, but sluggish.  He responds very slowly. HEENT:  His mucous membranes are dry.  His pupils are reactive.  He seems mildly confused and sedated. HEART:  Regular. ABDOMEN:  Soft.  He has no masses. EXTREMITIES:  No edema. CHEST:  Relatively clear. CENTRAL NERVOUS SYSTEM:  As above.  LABORATORY WORK:  His BUN was 35 with a creatinine of 2.24 and his white blood count was 6300.  ASSESSMENT:  I think he is dehydrated.  I think he clearly has some problems with his change in medications and has some psychiatric overlay with all of this.  He has probably urinary tract infection.  He may have pneumonia and he will have repeat chest x-ray.  PLAN:  My plan then is to put him on Rocephin, give him IV  fluids, continue with current medications.     Nashya Garlington L. Juanetta Gosling, M.D.     ELH/MEDQ  D:  06/16/2013  T:  06/16/2013  Job:  [id number]

## 2013-06-16 NOTE — ED Notes (Signed)
Called back for 2nd attempt with report. Derrick Butler still unavailable for report and charge nurse not available either per Maureen Ralphs

## 2013-06-16 NOTE — ED Notes (Signed)
Received 1L NS bolus. Took am meds. Pt requesting water and graham cracker. Both given to pt.  Sitter at [facility].

## 2013-06-16 NOTE — Progress Notes (Signed)
ANTICOAGULATION CONSULT NOTE - Initial Consult  Pharmacy Consult for Lovenox Indication: VTE prophylaxis  No Known Allergies  Patient Measurements:   Last recorded weight: 78kg on 06/02/13  Vital Signs: Temp: 99.7 F (37.6 C) (10/13 0931) Temp src: Rectal (10/13 0931) BP: 91/54 mmHg (10/13 1407) Pulse Rate: 54 (10/13 1407)  Labs:  Recent Labs  06/14/13 0225 06/16/13 0933  HGB 12.4* 13.9  HCT 35.6* 40.5  PLT 111* 118*  CREATININE 2.06* 2.24*  CKTOTAL 359*  --     The CrCl is unknown because both a height and weight (above a minimum accepted value) are required for this calculation.   Medical History: Past Medical History  Diagnosis Date  . Aortic dissection   . Hypertension   . CVA (cerebral infarction)   . Aortic dissection   . Hypertension   . CVA (cerebral infarction)   . Diabetes mellitus   . Cardiomyopathy   . Hyperglycemia   . Bipolar 1 disorder   . Hyperkalemia   . Coronary artery disease   . Hyponatremia   . Altered mental status     with psychosis    Medications:  Scheduled:  . aspirin  325 mg Oral Daily  . benztropine  1 mg Oral TID  . cefTRIAXone (ROCEPHIN)  IV  1 g Intravenous Q24H  . cloNIDine  0.3 mg Oral Daily  . divalproex  250 mg Oral TID  . enoxaparin (LOVENOX) injection  40 mg Subcutaneous Q24H  . fluticasone  2 spray Each Nare Daily  . labetalol  100 mg Oral BID  . levofloxacin  250 mg Oral Daily  . ramipril  5 mg Oral Daily  . risperiDONE  1 mg Oral TID  . simvastatin  40 mg Oral QHS  . temazepam  15 mg Oral QHS    Assessment: 57 yo M admitted from a group home with UTI and ?PNA.    Low baseline platelet count noted.  No bleeding reported/ Hg is wnl. Patient appears to have some baseline renal dysfunction.  Estimated CrCl ~ 35-32ml/min)  Goal of Therapy:  Monitor platelets by anticoagulation protocol: Yes   Plan:  Lovenox 40mg  sq daily F/U updated weight & renal function  Elson Clan 06/16/2013,4:15  PM

## 2013-06-16 NOTE — Clinical Social Work Psychosocial (Signed)
Clinical Social Work Department BRIEF PSYCHOSOCIAL ASSESSMENT 06/16/2013  Patient:  Derrick Butler, Derrick Butler     Account Number:  [id number]     Admit date:  06/07/2013  Clinical Social Worker:  Nancie Neas  Date/Time:  06/16/2013 03:30 PM  Referred by:  CSW  Date Referred:  06/16/2013 Referred for  Behavioral Health Issues   Other Referral:   Interview type:  Other - See comment Other interview type:   Derrick Butler- POA/friend    PSYCHOSOCIAL DATA Living Status:  FACILITY Admitted from facility:  OTHER Level of care:  Assisted Living Primary support name:  Derrick Butler Primary support relationship to patient:  FRIEND Degree of support available:   adequate    CURRENT CONCERNS Current Concerns  Post-Acute Placement   Other Concerns:   Behavioral health    SOCIAL WORK ASSESSMENT / PLAN CSW spoke with pt's friend/POA Derrick Butler as pt is not oriented at this time. Pt has been in ED for over a week. He was brought in due to unusual behavior/combative at [facility] where pt has been a resident for about 2 years. Pt has minimal family involvement. He has a sister at [facility] and another sister who does not participate in his care. Derrick Butler indicates she has known pt for many years and is a long time family friend. She states she is POA and has been assisting with pt's decisions for some time. Pt was originally placed at [facility]. After several years, pt began sneaking away from facility and had to be placed at [facility] as [facility] could not continue to meet needs. Derrick Butler reports pt has been doing fairly well at [facility]. He started sleeping about 23 hours a day and had some behavioral issues so he was referred to Dr. Tenny Craw at [facility]. Dr. Tenny Craw stopped Haldol and Seroquel and started pt on Risperdal. Several days later facility noticed a change in behavior. He started escaping from [place] and also talking to himself and other objects. Hallucinations noted in ED.    Pt was diagnosed  many years ago with Bipolar disorder. Derrick Butler reports pt has had two psych admissions. She believes they were in 2005 and 2008. He was seen at [facility] years ago and then stopped going. Since that time, PCP Dr. Juanetta Gosling has been prescribing medications for treatment. CSW spoke with Dr. Juanetta Gosling who will arrange psych consult to determine if pt is cleared. Pt has had several evaluations in ED. He began deteriorating in ED and was not eating well or ambulating. Pt has been agitated/combative in ED. Dr. Juanetta Gosling was consulted and feels admission is appropriate. He plans to start pt on IV antibiotic. Pt may require higher level of care. CSW attempted to reach facility, but no voicemail set up. PT consult ordered. MD feels condition could be both psych and acute infection related.   Assessment/plan status:  Psychosocial Support/Ongoing Assessment of Needs Other assessment/ plan:   Information/referral to community resources:   Will assess tomorrow for appropriate referrals    PATIENT'S/FAMILY'S RESPONSE TO PLAN OF CARE: Pt unable to be assessed due to mental status. Derrick Butler is aware that pt may require higher level of care. CSW will follow up tomorrow for appropriate disposition determination.       Derenda Fennel, [place] [phone]

## 2013-06-16 NOTE — ED Notes (Signed)
Called to speak with nurse for receiving report, per Jacelyn Pi will have to call back for report.

## 2013-06-16 NOTE — BH Assessment (Signed)
Spoke to patient's nurse-Amanda regarding patients disposition. Sts that patient may be declining. He is incontinent, ate one bite of food this am, not walking, refused meds yesterday, etc. Marchelle Folks referred this Clinical research associate to the charge nurse-Judy for additional information.   Writer has discussed this patient's case with Levell July Sales promotion account executive) of [facility]. She will discuss this case with Dr. Lucianne Muss (Medical Director) and follow up with this writer with recommendations.   Writer contacted Darel Hong for collateral information about to discuss patient's disposition, status (mental and physical), and plan. She asked this Clinical research associate to wait before working on this patient's disposition. Says that patient's medical doctor is upstairs but will down shortly to evaluate Mr. Debella. Also says that SW has been contacted to assist with patient's disposition. According to Darel Hong, patient is not psychiatrically cleared as of yet. She would like [facility] staff hold off on  dispositioning this patient until he is evaluated by the medical doctor and SW.

## 2013-06-16 NOTE — ED Notes (Signed)
[facility] called concerning pt bed placement. Will contact our charge RN about pt status.

## 2013-06-16 NOTE — Progress Notes (Signed)
Chart reviewed per request of change nurse nurse, Darel Hong. Pt is being admitted here at [facility],  Today,  by PCP,  due to medical issues. He has a possible PNA, UTI, and failure to thrive over the last several days.

## 2013-06-16 NOTE — Progress Notes (Signed)
INITIAL NUTRITION ASSESSMENT  DOCUMENTATION CODES Per approved criteria  -Not Applicable   INTERVENTION: Follow for diet advancement. Add nutritional supplement when diet is advanced.  NUTRITION DIAGNOSIS: Inadequate oral intake related to poor PO intake PTA, AMS as evidenced by lethargy, refusal of food in ED.   Goal: Pt will meet >90% of estimated energy needs  Monitor:  Diet advancement and tolerance, PO intake, labs, weight changes, skin integrity, changes in status  Reason for Assessment: LOS (day 64)  57 y.o. male  Admitting Dx: <principal problem not specified>  ASSESSMENT: Pt admitted for possible PNA, UTI, and FTT. PTA to [facility] today, pt was being held in [facility] ED awaiting inpatient psychiatric placement, due to unusual behavior changes, which were thought to possible be as a result of medication changes per chart review. Pt with hx of psychiatric illness. Pt is a resident of [facility].  Chart reviewed. Pt inappropriate to interview due to cognitive impairment. Per RD, pt with very poor po intake for the past several days. Noted pt refusing food and spitting out food. Pt has been receiving feeding assistance in ED, however, pt is difficult to feed, due to lethargy and combativeness. There is currently no diet order, but noted pt was receiving a carb modified diet in the ED.  Wt hx is limited; last wt reading of 172#. Unable to diagnose malnutrition at this time, but pt is at risk for malnutrition due to hx of poor po intake.   Height: Ht Readings from Last 1 Encounters:  06/02/13 5\' 8"  (1.727 m)    Weight: Wt Readings from Last 1 Encounters:  06/02/13 172 lb (78.019 kg)    Ideal Body Weight: 154#  % Ideal Body Weight: 112%  Wt Readings from Last 10 Encounters:  06/02/13 172 lb (78.019 kg)    Usual Body Weight: 172#  % Usual Body Weight: 100%  BMI: 26.15. Meets criteria for overweight.   Estimated Nutritional Needs: Kcal: 1500-1600 kcals  daily Protein: 62-78 grams protein daily Fluid: 1.5-1.6 L fluid daily  Skin: No issues noted  Diet Order:    EDUCATION NEEDS: -Education not appropriate at this time  No intake or output data in the 24 hours ending 06/16/13 1425  Last BM: PTA   Labs:   Recent Labs Lab 06/12/13 2043 06/14/13 0225 06/16/13 0933  NA 137 139 141  K 4.1 4.1 4.6  CL 101 105 105  CO2 24 24 24   BUN 59* 44* 35*  CREATININE 2.69* 2.06* 2.24*  CALCIUM 9.0 8.8 9.7  GLUCOSE 166* 97 132*    CBG (last 3)   Recent Labs  06/15/13 1224 06/15/13 1704 06/16/13 0801  GLUCAP 91 96 104*    Scheduled Meds: . aspirin  325 mg Oral Daily  . benztropine  1 mg Oral TID  . cloNIDine  0.3 mg Oral Daily  . divalproex  250 mg Oral TID  . fluticasone  2 spray Each Nare Daily  . labetalol  100 mg Oral BID  . levofloxacin  250 mg Oral Daily  . ramipril  5 mg Oral Daily  . risperiDONE  1 mg Oral TID  . simvastatin  40 mg Oral QHS  . temazepam  15 mg Oral QHS    Continuous Infusions: . sodium chloride 75 mL/hr at 06/16/13 1305    Past Medical History  Diagnosis Date  . Aortic dissection   . Hypertension   . CVA (cerebral infarction)   . Aortic dissection   .  Hypertension   . CVA (cerebral infarction)   . Diabetes mellitus   . Cardiomyopathy   . Hyperglycemia   . Bipolar 1 disorder   . Hyperkalemia   . Coronary artery disease   . Hyponatremia   . Altered mental status     with psychosis    Past Surgical History  Procedure Laterality Date  . Ascending aortic aneurysm repair      Joson Sapp A. Mayford Knife, RD, LDN Pager: [phone]

## 2013-06-16 NOTE — Progress Notes (Signed)
PT Cancellation Note  Patient Details Name: Derrick Butler MRN: [id number] DOB: 07/07/1956   Cancelled Treatment:    Reason Eval/Treat Not Completed: Patient's level of consciousness Pt is minimally arousable.  Sitter states that he has been like this since being on the floor.  Will try again tomorrow.  Myrlene Broker L 06/16/2013, 3:55 PM

## 2013-06-16 NOTE — ED Notes (Signed)
Dr. Juanetta Gosling here assessing pt at this time.

## 2013-06-17 ENCOUNTER — Inpatient Hospital Stay (HOSPITAL_COMMUNITY): Payer: Medicare Other

## 2013-06-17 LAB — GLUCOSE, CAPILLARY: Glucose-Capillary: 152 mg/dL — ABNORMAL HIGH (ref 70–99)

## 2013-06-17 MED ORDER — ENSURE COMPLETE PO LIQD
237.0000 mL | Freq: Three times a day (TID) | ORAL | Status: DC
  Administered 2013-06-17 – 2013-06-19 (×6): 237 mL via ORAL

## 2013-06-17 NOTE — Clinical Social Work Placement (Signed)
Clinical Social Work Department CLINICAL SOCIAL WORK PLACEMENT NOTE 06/17/2013  Patient:  Derrick Butler, Derrick Butler  Account Number:  [id number] Admit date:  06/07/2013  Clinical Social Worker:  Derenda Fennel, LCSW  Date/time:  06/17/2013 11:57 AM  Clinical Social Work is seeking post-discharge placement for this patient at the following level of care:   SKILLED NURSING   (*CSW will update this form in Epic as items are completed)   06/17/2013  Patient/family provided with [facility] Department of Clinical Social Work's list of facilities offering this level of care within the geographic area requested by the patient (or if unable, by the patient's family).  06/17/2013  Patient/family informed of their freedom to choose among providers that offer the needed level of care, that participate in Medicare, Medicaid or managed care program needed by the patient, have an available bed and are willing to accept the patient.  06/17/2013  Patient/family informed of [facility]' ownership interest in [facility], as well as of the fact that they are under no obligation to receive care at this facility.  PASARR submitted to EDS on 06/17/2013 PASARR number received from EDS on   FL2 transmitted to all facilities in geographic area requested by pt/family on  06/17/2013 FL2 transmitted to all facilities within larger geographic area on   Patient informed that his/her managed care company has contracts with or will negotiate with  certain facilities, including the following:     Patient/family informed of bed offers received:  06/17/2013 Patient chooses bed at [facility] [place] Physician recommends and patient chooses bed at  [facility] [name]  Patient to be transferred to  on   Patient to be transferred to facility by   The following physician request were entered in Epic:   Additional Comments: Will require face-to-face pasarr evaluation.   Derenda Fennel, [place] [phone]

## 2013-06-17 NOTE — Clinical Social Work Note (Signed)
Pt evaluated by PT this morning and recommendation is for SNF. CSW discussed SNF placement with Glenda who is agreeable to bed search in [facility], with preference for [facility]. CSW presented bed offer at [facility] pending psych clearance and Rivka Barbara accepts. Facility notified. MD to review notes to determine if further psych involvement is necessary. Pt has been referred for face-to-face pasarr evaluation.   Derenda Fennel, [place] [phone]

## 2013-06-17 NOTE — Progress Notes (Signed)
Patient has been pleasant and rested well since coming to the floor.  No signs of agitation or combativeness. Will continue to monitor patient. [place], [place] Stagers

## 2013-06-17 NOTE — Progress Notes (Signed)
Patient awake and pleasant this am.  Attempting to eat breakfast at this time.  No signs of agitation or combativeness. Will continue to monitor patient. Schonewitz, [place] Stagers

## 2013-06-17 NOTE — Progress Notes (Signed)
Nutrition Note  Chart reviewed due to diet advancement. See full assessment on 06/16/13 for further details. Pt with increased alertness. He has been upgraded to a Heart Healthy diet. PO intake remains poor (0%). Will add Ensure Complete po BID, each supplement provides 350 kcal and 13 grams of protein.  Will continue to follow.

## 2013-06-17 NOTE — Progress Notes (Signed)
Subjective: Derrick Butler is much more alert this morning. Derrick Butler is not agitated.  Objective: Vital signs in last 24 hours: Temp:  [97.5 F (36.4 C)-99.7 F (37.6 C)] 97.5 F (36.4 C) (10/14 0550) Pulse Rate:  [50-63] 60 (10/14 0808) Resp:  [14-16] 14 (10/14 0550) BP: (91-135)/(41-79) 126/77 mmHg (10/14 0808) SpO2:  [100 %] 100 % (10/14 0550) Weight change:  Last BM Date:  (unsure)  Intake/Output from previous day:    PHYSICAL EXAM General appearance: alert, cooperative and no distress Resp: clear to auscultation bilaterally Cardio: regular rate and rhythm, S1, S2 normal, no murmur, click, rub or gallop GI: soft, non-tender; bowel sounds normal; no masses,  no organomegaly Extremities: extremities normal, atraumatic, no cyanosis or edema  Lab Results:    Basic Metabolic Panel:  Recent Labs  19/14/78 0933  NA 141  K 4.6  CL 105  CO2 24  GLUCOSE 132*  BUN 35*  CREATININE 2.24*  CALCIUM 9.7   Liver Function Tests:  Recent Labs  06/15/13 1620 06/16/13 0933  AST  --  20  ALT  --  13  ALKPHOS  --  61  BILITOT  --  0.3  PROT  --  7.1  ALBUMIN 3.1* 3.4*   No results found for this basename: LIPASE, AMYLASE,  in the last 72 hours No results found for this basename: AMMONIA,  in the last 72 hours CBC:  Recent Labs  06/16/13 0933  WBC 6.3  NEUTROABS 3.5  HGB 13.9  HCT 40.5  MCV 89.8  PLT 118*   Cardiac Enzymes: No results found for this basename: CKTOTAL, CKMB, CKMBINDEX, TROPONINI,  in the last 72 hours BNP: No results found for this basename: PROBNP,  in the last 72 hours D-Dimer: No results found for this basename: DDIMER,  in the last 72 hours CBG:  Recent Labs  06/15/13 1224 06/15/13 1704 06/16/13 0801 06/16/13 1627 06/16/13 2114 06/17/13 0731  GLUCAP 91 96 104* 107* 98 84   Hemoglobin A1C: No results found for this basename: HGBA1C,  in the last 72 hours Fasting Lipid Panel: No results found for this basename: CHOL, HDL, LDLCALC, TRIG, CHOLHDL,  LDLDIRECT,  in the last 72 hours Thyroid Function Tests: No results found for this basename: TSH, T4TOTAL, FREET4, T3FREE, THYROIDAB,  in the last 72 hours Anemia Panel: No results found for this basename: VITAMINB12, FOLATE, FERRITIN, TIBC, IRON, RETICCTPCT,  in the last 72 hours Coagulation: No results found for this basename: LABPROT, INR,  in the last 72 hours Urine Drug Screen: Drugs of Abuse     Component Value Date/Time   LABOPIA NONE DETECTED 06/08/2013 0518   COCAINSCRNUR NONE DETECTED 06/08/2013 0518   LABBENZ NONE DETECTED 06/08/2013 0518   AMPHETMU NONE DETECTED 06/08/2013 0518   THCU NONE DETECTED 06/08/2013 0518   LABBARB NONE DETECTED 06/08/2013 0518    Alcohol Level: No results found for this basename: ETH,  in the last 72 hours Urinalysis: No results found for this basename: COLORURINE, APPERANCEUR, LABSPEC, PHURINE, GLUCOSEU, HGBUR, BILIRUBINUR, KETONESUR, PROTEINUR, UROBILINOGEN, NITRITE, LEUKOCYTESUR,  in the last 72 hours Misc. Labs:  ABGS No results found for this basename: PHART, PCO2, PO2ART, TCO2, HCO3,  in the last 72 hours CULTURES Recent Results (from the past 240 hour(s))  URINE CULTURE     Status: None   Collection Time    06/08/13  8:20 AM      Result Value Range Status   Specimen Description URINE, CLEAN CATCH   Final  Special Requests NONE   Final   Culture  Setup Time     Final   Value: 06/08/2013 21:48     Performed at [facility]   [place] Count     Final   Value: NO GROWTH     Performed at [facility]   Culture     Final   Value: NO GROWTH     Performed at [facility]   Report Status 06/10/2013 FINAL   Final  URINE CULTURE     Status: None   Collection Time    06/13/13 11:15 PM      Result Value Range Status   Specimen Description URINE, CATHETERIZED   Final   Special Requests NONE   Final   Culture  Setup Time     Final   Value: 06/14/2013 22:04     Performed at [facility] Count      Final   Value: NO GROWTH     Performed at [facility]   Culture     Final   Value: NO GROWTH     Performed at [facility]   Report Status 06/15/2013 FINAL   Final   Studies/Results: Dg Chest Port 1 View  06/17/2013   CLINICAL DATA:  Shortness of breath, history hypertension, diabetes, coronary artery disease, cardiomyopathy, aortic dissection, stroke  EXAM: PORTABLE CHEST - 1 VIEW  COMPARISON:  Portable exam 0815 hr compared to 06/14/2013  FINDINGS: Enlargement of cardiac silhouette post median sternotomy.  Slight rotation to the left.  Tortuous aorta.  Pulmonary vascularity normal.  Low lung volumes with bibasilar atelectasis.  Remaining lungs clear.  No gross pleural effusion or pneumothorax.  Bones demineralized.  IMPRESSION: Mild enlargement of cardiac silhouette.  Low lung volumes with bibasilar atelectasis.   Electronically Signed   By: Ulyses Southward M.D.   On: 06/17/2013 08:22    Medications:  Scheduled: . aspirin  325 mg Oral Daily  . benztropine  1 mg Oral TID  . cefTRIAXone (ROCEPHIN)  IV  1 g Intravenous Q24H  . cloNIDine  0.3 mg Oral Daily  . divalproex  250 mg Oral TID  . enoxaparin (LOVENOX) injection  40 mg Subcutaneous Q24H  . fluticasone  2 spray Each Nare Daily  . influenza vac split quadrivalent PF  0.5 mL Intramuscular Tomorrow-1000  . labetalol  100 mg Oral BID  . levofloxacin  250 mg Oral Daily  . pneumococcal 23 valent vaccine  0.5 mL Intramuscular Tomorrow-1000  . ramipril  5 mg Oral Daily  . risperiDONE  1 mg Oral TID  . simvastatin  40 mg Oral QHS  . temazepam  15 mg Oral QHS   Continuous: . sodium chloride 75 mL/hr at 06/16/13 1305   PRN:  Assesment: Derrick Butler is much improved today. Derrick Butler is back to his normal mental status. Active Problems:   Bipolar I disorder, most recent episode mixed   Pneumonia    Plan: Continue current treatments    LOS: 10 days   Derrick Butler L 06/17/2013, 9:05 AM

## 2013-06-17 NOTE — Evaluation (Signed)
Physical Therapy Evaluation Patient Details Name: Derrick Butler MRN: [id number] DOB: [birth year] Today's Date: 06/17/2013 Time: 0901-0920 PT Time Calculation (min): 19 min  PT Assessment / Plan / Recommendation History of Present Illness  Pt is a resident of a group home with a hx of Bipolar disorder who was seen in the ED for altered mental status.  During his prolonged stay there he developed an UTI and has been admitted for tx.  He has been very somulent and difficult to awaken.  Clinical Impression   Pt was seen for evaluation.  He continues to have significant drowsiness but was able to awaken for short periods of time.  He was able to follow most directions and was found to have generalized deconditioning which prevented gait stability.  He needed a walker for ambulating 60' but had knee instability during gait..  I would recommend SNF  At d/c to help him return to functional independence.    PT Assessment  Patient needs continued PT services    Follow Up Recommendations  SNF    Does the patient have the potential to tolerate intense rehabilitation      Barriers to Discharge        Equipment Recommendations  Rolling walker with 5" wheels    Recommendations for Other Services     Frequency Min 3X/week    Precautions / Restrictions Precautions Precautions: Fall Restrictions Weight Bearing Restrictions: No   Pertinent Vitals/Pain       Mobility  Bed Mobility Bed Mobility: Supine to Sit Supine to Sit: 4: Min assist;HOB elevated Transfers Transfers: Sit to Stand;Stand to Sit Sit to Stand: 4: Min guard;From bed;With upper extremity assist Stand to Sit: 4: Min guard;To bed;With upper extremity assist Ambulation/Gait Ambulation/Gait Assistance: 3: Mod assist Ambulation Distance (Feet): 60 Feet Assistive device: Rolling walker;None Gait Pattern: Trunk flexed;Decreased hip/knee flexion - right;Decreased hip/knee flexion - left Gait velocity: slow General Gait  Details: gait without walker is unstable.  He was given a walker and this stabilized his gait for a short distance, however his left knee buckled during gait and he needed stabilization by therapist. Stairs: No Wheelchair Mobility Wheelchair Mobility: No    Exercises     PT Diagnosis: Difficulty walking;Generalized weakness  PT Problem List: Decreased strength;Decreased activity tolerance;Decreased mobility;Decreased knowledge of use of DME;Decreased safety awareness PT Treatment Interventions: Gait training;Functional mobility training;Therapeutic activities;Therapeutic exercise;Patient/family education     PT Goals(Current goals can be found in the care plan section) Acute Rehab PT Goals Patient Stated Goal: none stated PT Goal Formulation: Patient unable to participate in goal setting Time For Goal Achievement: 07/01/13 Potential to Achieve Goals: Good  Visit Information  Last PT Received On: 06/17/13 History of Present Illness: Pt is a resident of a group home with a hx of Bipolar disorder who was seen in the ED for altered mental status.  During his prolonged stay there he developed an UTI and has been admitted for tx.  He has been very somulent and difficult to awaken.       Prior Functioning  Home Living Family/patient expects to be discharged to:: Skilled nursing facility Prior Function Level of Independence: Independent Communication Communication: No difficulties    Cognition  Cognition Arousal/Alertness: Lethargic Behavior During Therapy: Flat affect Overall Cognitive Status: Difficult to assess Difficult to assess due to: Level of arousal    Extremity/Trunk Assessment Lower Extremity Assessment Lower Extremity Assessment: Generalized weakness   Balance Balance Balance Assessed: Yes Static Sitting Balance Static Sitting -  Balance Support: No upper extremity supported;Feet supported Static Sitting - Level of Assistance: 7: Independent High Level Balance High  Level Balance Comments: It was difficult to assess balance due to pt's lethargy and inability to focus on directions...his decreased standing balance appears to be due to generalized weakness rather than from a neurologic dysfunction.  End of Session PT - End of Session Equipment Utilized During Treatment: Gait belt Activity Tolerance: Patient limited by fatigue;Patient limited by lethargy Patient left: in bed;with call bell/phone within reach;with bed alarm set Nurse Communication: Mobility status  GP     Konrad Penta 06/17/2013, 9:32 AM

## 2013-06-18 DIAGNOSIS — R4182 Altered mental status, unspecified: Secondary | ICD-10-CM | POA: Diagnosis present

## 2013-06-18 DIAGNOSIS — Z8673 Personal history of transient ischemic attack (TIA), and cerebral infarction without residual deficits: Secondary | ICD-10-CM

## 2013-06-18 LAB — GLUCOSE, CAPILLARY
Glucose-Capillary: 99 mg/dL (ref 70–99)
Glucose-Capillary: 165 mg/dL — ABNORMAL HIGH (ref 70–99)
Glucose-Capillary: 196 mg/dL — ABNORMAL HIGH (ref 70–99)

## 2013-06-18 LAB — CBC
HCT: 31.8 % — ABNORMAL LOW (ref 39.0–52.0)
Hemoglobin: 11 g/dL — ABNORMAL LOW (ref 13.0–17.0)
MCH: 30.9 pg (ref 26.0–34.0)
MCHC: 34.6 g/dL (ref 30.0–36.0)
MCV: 89.3 fL (ref 78.0–100.0)
RBC: 3.56 MIL/uL — ABNORMAL LOW (ref 4.22–5.81)

## 2013-06-18 LAB — BASIC METABOLIC PANEL
BUN: 31 mg/dL — ABNORMAL HIGH (ref 6–23)
Chloride: 106 mEq/L (ref 96–112)
Creatinine, Ser: 2.01 mg/dL — ABNORMAL HIGH (ref 0.50–1.35)
GFR calc non Af Amer: 35 mL/min — ABNORMAL LOW (ref >90)
Glucose, Bld: 108 mg/dL — ABNORMAL HIGH (ref 70–99)
Potassium: 4.2 mEq/L (ref 3.5–5.1)

## 2013-06-18 NOTE — Progress Notes (Signed)
ANTICOAGULATION CONSULT NOTE  Pharmacy Consult for Lovenox Indication: VTE prophylaxis  No Known Allergies  Patient Measurements: Weight: 168 lb 3.4 oz (76.3 kg) Last recorded weight: 78kg on 06/02/13  Vital Signs: Temp: 98.9 F (37.2 C) (10/15 0630) Temp src: Oral (10/15 0630) BP: 128/72 mmHg (10/15 0630) Pulse Rate: 59 (10/15 0630)  Labs:  Recent Labs  06/16/13 0933 06/18/13 0609  HGB 13.9 11.0*  HCT 40.5 31.8*  PLT 118* 101*  CREATININE 2.24* 2.01*    The CrCl is unknown because both a height and weight (above a minimum accepted value) are required for this calculation.   Medical History: Past Medical History  Diagnosis Date  . Aortic dissection   . Hypertension   . CVA (cerebral infarction)   . Aortic dissection   . Hypertension   . CVA (cerebral infarction)   . Diabetes mellitus   . Cardiomyopathy   . Hyperglycemia   . Bipolar 1 disorder   . Hyperkalemia   . Coronary artery disease   . Hyponatremia   . Altered mental status     with psychosis    Medications:  Scheduled:  . aspirin  325 mg Oral Daily  . benztropine  1 mg Oral TID  . cefTRIAXone (ROCEPHIN)  IV  1 g Intravenous Q24H  . cloNIDine  0.3 mg Oral Daily  . divalproex  250 mg Oral TID  . enoxaparin (LOVENOX) injection  40 mg Subcutaneous Q24H  . feeding supplement (ENSURE COMPLETE)  237 mL Oral TID WC  . fluticasone  2 spray Each Nare Daily  . labetalol  100 mg Oral BID  . levofloxacin  250 mg Oral Daily  . ramipril  5 mg Oral Daily  . risperiDONE  1 mg Oral TID  . simvastatin  40 mg Oral QHS  . temazepam  15 mg Oral QHS    Assessment: 57 yo M admitted from a group home with UTI.    Low baseline platelet count noted.  No bleeding reported/ CBC stable. Patient appears to have some baseline renal dysfunction, but Scr trending down.  Estimated CrCl ~ 35-43ml/min)  Goal of Therapy:  Monitor platelets by anticoagulation protocol: Yes   Plan:  Lovenox 40mg  sq daily Pharmacy to sign  off.   Elson Clan 06/18/2013,8:49 AM

## 2013-06-18 NOTE — Progress Notes (Signed)
Physical Therapy Treatment Patient Details Name: Derrick Butler MRN: [id number] DOB: [birth year] Today's Date: 06/18/2013 Time: 6440-3474 PT Time Calculation (min): 23 min 1 Gt 1 Therex  PT Assessment / Plan / Recommendation  Patient requires direct verbal cueing to stay on task. Some assistance required with bed exercises on Left UE/LE due to weakness. Patient is impulsive and can move quickly, which puts him at very high risk for falls. Patient did well today with gait, required verbal cues to slow down several times. Patient completed 55' with RW;min A-no lob. No episodes of of knee buckling today.                                           Progress towards PT Goals Progress towards PT goals: Progressing toward goals  Plan      Precautions / Restrictions         Mobility  Bed Mobility Bed Mobility: Supine to Sit Supine to Sit: 6: Modified independent (Device/Increase time);HOB elevated Transfers Transfers: Sit to Stand;Stand to Sit Sit to Stand: 4: Min guard Stand to Sit: 4: Min guard Details for Transfer Assistance: patient impulsive and quick;close guarding required Ambulation/Gait Ambulation/Gait Assistance: 4: Min guard Ambulation Distance (Feet): 100 Feet Assistive device: Rolling walker Gait Pattern: Shuffle;Decreased hip/knee flexion - right;Decreased hip/knee flexion - left;Trunk flexed Gait velocity: increased;verbal cues to slow down required General Gait Details: no buckling of knee present today Stairs: No Wheelchair Mobility Wheelchair Mobility: No    Exercises General Exercises - Lower Extremity Ankle Circles/Pumps: AROM;10 reps;Both;Supine Quad Sets:  (attempted pt not able to understand technique) Heel Slides: AAROM;Both;10 reps;Supine;AROM (LLE assisted) Straight Leg Raises: AROM;AAROM;10 reps;Supine (LLE assisted) Other Exercises Other Exercises: STS x4   PT Diagnosis:    PT Problem List:   PT Treatment Interventions:     PT Goals  (current goals can now be found in the care plan section)    Visit Information  Last PT Received On: 06/18/13 Assistance Needed: +1    Subjective Data                     GP     Rhina Kramme ATKINSO 06/18/2013, 11:17 AM

## 2013-06-18 NOTE — BH Assessment (Signed)
Writer spoke to the nurse working with the patient [facility]) and the patient is not medically cleared.  Writer was informed that the patient is being treated for a UTI.  The nurse reports that she will call the [facility] when the patient is medically cleared to receive an assessment.    Writer was informed that the patient has to be psychiatrically cleared so that he can be placed at [facility] in [place].

## 2013-06-18 NOTE — Progress Notes (Signed)
RN contacted Behavioral Health and spoke with Jonny Ruiz to check on status of patient's telepsych for psych clearance.  John looked up notes on the patient and stated that his notes stated the patient did not need psych evaluation because he had been admitted to a Med-Surg unit.  Will call back and speak to Ava in the morning.

## 2013-06-18 NOTE — Clinical Social Work Note (Signed)
CSW spoke with RN who reports telepsych for clearance has been scheduled for later today. Derrick Butler will be evaluating pt for pasarr this afternoon as well. CSW to continue to follow.  Derenda Fennel, [place] [phone]

## 2013-06-18 NOTE — Progress Notes (Signed)
Telepsych consult call placed by RN.  Stated that they would call us back with the time.

## 2013-06-18 NOTE — Progress Notes (Signed)
Subjective: He says he feels fairly well. He is awake and alert and cooperative.  Objective: Vital signs in last 24 hours: Temp:  [97.3 F (36.3 C)-98.9 F (37.2 C)] 98.9 F (37.2 C) (10/15 0630) Pulse Rate:  [59-71] 59 (10/15 0630) Resp:  [14-18] 14 (10/15 0630) BP: (88-128)/(56-72) 128/72 mmHg (10/15 0630) SpO2:  [96 %-99 %] 96 % (10/15 0630) Weight:  [76.3 kg (168 lb 3.4 oz)] 76.3 kg (168 lb 3.4 oz) (10/14 1206) Weight change:  Last BM Date: 06/13/13  Intake/Output from previous day: 10/14 0701 - 10/15 0700 In: 120 [P.O.:120] Out: -   PHYSICAL EXAM General appearance: alert, cooperative and mild distress Resp: clear to auscultation bilaterally Cardio: regular rate and rhythm, S1, S2 normal, no murmur, click, rub or gallop GI: soft, non-tender; bowel sounds normal; no masses,  no organomegaly Extremities: extremities normal, atraumatic, no cyanosis or edema  Lab Results:    Basic Metabolic Panel:  Recent Labs  16/10/96 0933 06/18/13 0609  NA 141 140  K 4.6 4.2  CL 105 106  CO2 24 25  GLUCOSE 132* 108*  BUN 35* 31*  CREATININE 2.24* 2.01*  CALCIUM 9.7 8.7   Liver Function Tests:  Recent Labs  06/15/13 1620 06/16/13 0933  AST  --  20  ALT  --  13  ALKPHOS  --  61  BILITOT  --  0.3  PROT  --  7.1  ALBUMIN 3.1* 3.4*   No results found for this basename: LIPASE, AMYLASE,  in the last 72 hours No results found for this basename: AMMONIA,  in the last 72 hours CBC:  Recent Labs  06/16/13 0933 06/18/13 0609  WBC 6.3 4.3  NEUTROABS 3.5  --   HGB 13.9 11.0*  HCT 40.5 31.8*  MCV 89.8 89.3  PLT 118* 101*   Cardiac Enzymes: No results found for this basename: CKTOTAL, CKMB, CKMBINDEX, TROPONINI,  in the last 72 hours BNP: No results found for this basename: PROBNP,  in the last 72 hours D-Dimer: No results found for this basename: DDIMER,  in the last 72 hours CBG:  Recent Labs  06/16/13 2114 06/17/13 0731 06/17/13 1203 06/17/13 1706  06/17/13 2046 06/18/13 0730  GLUCAP 98 84 112* 152* 169* 99   Hemoglobin A1C: No results found for this basename: HGBA1C,  in the last 72 hours Fasting Lipid Panel: No results found for this basename: CHOL, HDL, LDLCALC, TRIG, CHOLHDL, LDLDIRECT,  in the last 72 hours Thyroid Function Tests: No results found for this basename: TSH, T4TOTAL, FREET4, T3FREE, THYROIDAB,  in the last 72 hours Anemia Panel: No results found for this basename: VITAMINB12, FOLATE, FERRITIN, TIBC, IRON, RETICCTPCT,  in the last 72 hours Coagulation: No results found for this basename: LABPROT, INR,  in the last 72 hours Urine Drug Screen: Drugs of Abuse     Component Value Date/Time   LABOPIA NONE DETECTED 06/08/2013 0518   COCAINSCRNUR NONE DETECTED 06/08/2013 0518   LABBENZ NONE DETECTED 06/08/2013 0518   AMPHETMU NONE DETECTED 06/08/2013 0518   THCU NONE DETECTED 06/08/2013 0518   LABBARB NONE DETECTED 06/08/2013 0518    Alcohol Level: No results found for this basename: ETH,  in the last 72 hours Urinalysis: No results found for this basename: COLORURINE, APPERANCEUR, LABSPEC, PHURINE, GLUCOSEU, HGBUR, BILIRUBINUR, KETONESUR, PROTEINUR, UROBILINOGEN, NITRITE, LEUKOCYTESUR,  in the last 72 hours Misc. Labs:  ABGS No results found for this basename: PHART, PCO2, PO2ART, TCO2, HCO3,  in the last 72 hours CULTURES Recent  Results (from the past 240 hour(s))  URINE CULTURE     Status: None   Collection Time    06/13/13 11:15 PM      Result Value Range Status   Specimen Description URINE, CATHETERIZED   Final   Special Requests NONE   Final   Culture  Setup Time     Final   Value: 06/14/2013 22:04     Performed at [facility] Count     Final   Value: NO GROWTH     Performed at [facility]   Culture     Final   Value: NO GROWTH     Performed at [facility]   Report Status 06/15/2013 FINAL   Final   Studies/Results: Dg Chest Port 1 View  06/17/2013   CLINICAL  DATA:  Shortness of breath, history hypertension, diabetes, coronary artery disease, cardiomyopathy, aortic dissection, stroke  EXAM: PORTABLE CHEST - 1 VIEW  COMPARISON:  Portable exam 0815 hr compared to 06/14/2013  FINDINGS: Enlargement of cardiac silhouette post median sternotomy.  Slight rotation to the left.  Tortuous aorta.  Pulmonary vascularity normal.  Low lung volumes with bibasilar atelectasis.  Remaining lungs clear.  No gross pleural effusion or pneumothorax.  Bones demineralized.  IMPRESSION: Mild enlargement of cardiac silhouette.  Low lung volumes with bibasilar atelectasis.   Electronically Signed   By: Ulyses Southward M.D.   On: 06/17/2013 08:22    Medications:  Prior to Admission:  Prescriptions prior to admission  Medication Sig Dispense Refill  . aspirin 325 MG tablet Take 325 mg by mouth daily.        . benztropine (COGENTIN) 1 MG tablet Take 1 mg by mouth 3 (three) times daily.      . cloNIDine (CATAPRES) 0.3 MG tablet Take 0.3 mg by mouth daily.        . divalproex (DEPAKOTE) 250 MG DR tablet Take 250 mg by mouth 3 (three) times daily.       . fluticasone (FLONASE) 50 MCG/ACT nasal spray Place 2 sprays into the nose daily.      [name] glyBURIDE (DIABETA) 5 MG tablet Take 5 mg by mouth daily with breakfast.        . labetalol (NORMODYNE) 200 MG tablet Take 200 mg by mouth 2 (two) times daily.        . potassium chloride (KLOR-CON) 20 MEQ packet Take 20 mEq by mouth daily.        . ramipril (ALTACE) 10 MG tablet Take 10 mg by mouth daily.        . risperiDONE (RISPERDAL) 1 MG tablet Take 1 tablet (1 mg total) by mouth 3 (three) times daily.  90 tablet  2  . simvastatin (ZOCOR) 40 MG tablet Take 40 mg by mouth at bedtime.        . temazepam (RESTORIL) 15 MG capsule Take 15 mg by mouth at bedtime.         Scheduled: . aspirin  325 mg Oral Daily  . benztropine  1 mg Oral TID  . cefTRIAXone (ROCEPHIN)  IV  1 g Intravenous Q24H  . cloNIDine  0.3 mg Oral Daily  . divalproex  250 mg  Oral TID  . enoxaparin (LOVENOX) injection  40 mg Subcutaneous Q24H  . feeding supplement (ENSURE COMPLETE)  237 mL Oral TID WC  . fluticasone  2 spray Each Nare Daily  . labetalol  100 mg Oral BID  . levofloxacin  250 mg Oral Daily  . ramipril  5 mg Oral Daily  . risperiDONE  1 mg Oral TID  . simvastatin  40 mg Oral QHS  . temazepam  15 mg Oral QHS   Continuous: . sodium chloride 75 mL/hr at 06/17/13 1916   PRN:  Assesment: He was admitted with altered mental status. He was dehydrated. He has been treated for a urinary tract infection and has improved but we don't have a positive culture at this point. There was some concern about pneumonia but repeat chest x-ray really does not show that. He has known coronary artery occlusive disease he has had a severe stroke he has hypertension and he had an aortic aneurysm Active Problems:   Bipolar I disorder, most recent episode mixed   Pneumonia    Plan: Once he is cleared from a psychiatric point of view and we have all of the necessary paperwork he will be ready for transfer to skilled care facility    LOS: 11 days   Vadis Slabach L 06/18/2013, 8:40 AM

## 2013-06-18 NOTE — Progress Notes (Signed)
Ava, from telepsych at [facility], called and asked if patient was cleared medically.  Stated that patient would need to be cleared medically in order to get psych clearance.  RN explained that patient was needing psychiatric clearance in order to be discharged to a SNF.  Ava also stated that a counselor would be seeing the patient.  RN paged Dr. Juanetta Gosling.  Dr. Juanetta Gosling returned page and stated that patient needed psychiatric clearance and was medically cleared.  RN asked Dr. Juanetta Gosling if it was ok that a counselor saw the patient.  Dr. Juanetta Gosling stated that it was a ok for the counselor to see the patient and give psychiatric clearance.  RN called Ava and notified her that Dr. Juanetta Gosling stated patient was medically clear and that it was ok for a counselor to see the patient.  Stated Behavioral Health would be contacting us with telepsych appointment time.

## 2013-06-19 DIAGNOSIS — E860 Dehydration: Principal | ICD-10-CM | POA: Diagnosis present

## 2013-06-19 MED ORDER — LEVOFLOXACIN 250 MG PO TABS: 250.0000 mg | ORAL_TABLET | Freq: Every day | ORAL | Status: DC

## 2013-06-19 MED ORDER — ENOXAPARIN SODIUM 40 MG/0.4ML ~~LOC~~ SOLN: 40.0000 mg | SUBCUTANEOUS | Status: DC

## 2013-06-19 NOTE — Care Management Note (Addendum)
    Page 1 of 1   06/19/2013     2:27:11 PM   CARE MANAGEMENT NOTE 06/19/2013  Patient:  Derrick Butler, Derrick Butler   Account Number:  [id number]  Date Initiated:  06/19/2013  Documentation initiated by:  Rosemary Holms  Subjective/Objective Assessment:   Pt held in ED for [facility] placement. Developed medical problems and was admitted. Telepsych has cleared him and he is being DC'd to SNF.     Action/Plan:   Anticipated DC Date:  06/19/2013   Anticipated DC Plan:  SKILLED NURSING FACILITY  In-house referral  Clinical Social Worker      DC Planning Services  CM consult      Choice offered to / List presented to:             Status of service:  Completed, signed off Medicare Important Message given?  YES (If response is "NO", the following Medicare IM given date fields will be blank) Date Medicare IM given:  06/19/2013 Date Additional Medicare IM given:    Discharge Disposition:  SKILLED NURSING FACILITY  Per UR Regulation:    If discussed at Long Length of Stay Meetings, dates discussed:   06/19/2013    Comments:  06/19/13 Rosemary Holms RN BSN CM POA signed IM

## 2013-06-19 NOTE — Progress Notes (Signed)
Called [employer] and spoke to [place].  Stated that she did not see the telepsych order on their end.  Order was placed under care order.  Telepsych order reentered.  Idalia Needle stated that patient would be seen for telepsych evaluation before lunch today.

## 2013-06-19 NOTE — Progress Notes (Signed)
UR Chart Review Completed  

## 2013-06-19 NOTE — BH Assessment (Signed)
TC to RN Shon Hale to notify her pt is psychiatrically cleared. Writer paged Dr. Juanetta Gosling to notify him of disposition but hasn't spoken to [facility] yet.  Evette Cristal, [place] Assessment Counselor

## 2013-06-19 NOTE — Progress Notes (Signed)
Report called to Senah at [facility] in [place].

## 2013-06-19 NOTE — Clinical Social Work Note (Signed)
Pt d/c today to SNF. Cleared by psych this morning. 30 day pasarr received and facility is aware. Pt to transfer with [facility]. D/C summary faxed.   Derenda Fennel, [place] [phone]

## 2013-06-19 NOTE — BH Assessment (Signed)
Tele Assessment Note   Derrick Butler is an 57 y.o. male. Patient is in Room A332-01 at [facility]. Patient is alert and cooperative at time of teleassessment. He is oriented to self and place but doesn't know date or the [place] President. Pt denies SI and HI. He denies [facility] and no delusions noted. Pt is slow to answer questions. His thought process is tangential. Pt sts, "I have it made at this place [facility])". Pt able to perform some ADLs but requires guidance and assistance. Pt's RN Shon Hale states that pt has been non combative. She says he has had a good appetite and is able to be redirected when needed. Writer reviewed pt with Shelda Jakes PA-C who agrees with writer that pt is psychiatrically cleared and can be discharged and transported to SNF.   Axis I: Bipolar I Disorder Axis II: Deferred Axis III:  Past Medical History  Diagnosis Date  . Aortic dissection   . Hypertension   . CVA (cerebral infarction)   . Aortic dissection   . Hypertension   . CVA (cerebral infarction)   . Diabetes mellitus   . Cardiomyopathy   . Hyperglycemia   . Bipolar 1 disorder   . Hyperkalemia   . Coronary artery disease   . Hyponatremia   . Altered mental status     with psychosis   Axis IV: other psychosocial or environmental problems and problems related to social environment Axis V: 41-50 serious symptoms  Past Medical History:  Past Medical History  Diagnosis Date  . Aortic dissection   . Hypertension   . CVA (cerebral infarction)   . Aortic dissection   . Hypertension   . CVA (cerebral infarction)   . Diabetes mellitus   . Cardiomyopathy   . Hyperglycemia   . Bipolar 1 disorder   . Hyperkalemia   . Coronary artery disease   . Hyponatremia   . Altered mental status     with psychosis    Past Surgical History  Procedure Laterality Date  . Ascending aortic aneurysm repair      Family History:  Family History  Problem Relation Age of Onset  . Alzheimer's disease Mother    . Stroke Father   . Heart disease Father   . Bipolar disorder Father   . Bipolar disorder Sister   . Bipolar disorder Sister     Social History:  reports that he has never smoked. He does not have any smokeless tobacco history on file. He reports that he does not drink alcohol or use illicit drugs.  Additional Social History:  Alcohol / Drug Use Pain Medications: see PTA meds list & MAR Prescriptions: see PTA meds list & MAR Over the Counter: see PTA meds list & MAR History of alcohol / drug use?: No history of alcohol / drug abuse Longest period of sobriety (when/how long): None   CIWA: CIWA-Ar BP: 163/77 mmHg Pulse Rate: 55 COWS:    Allergies: No Known Allergies  Home Medications:  Medications Prior to Admission  Medication Sig Dispense Refill  . aspirin 325 MG tablet Take 325 mg by mouth daily.        . benztropine (COGENTIN) 1 MG tablet Take 1 mg by mouth 3 (three) times daily.      . cloNIDine (CATAPRES) 0.3 MG tablet Take 0.3 mg by mouth daily.        . divalproex (DEPAKOTE) 250 MG DR tablet Take 250 mg by mouth 3 (three) times daily.       [name]  fluticasone (FLONASE) 50 MCG/ACT nasal spray Place 2 sprays into the nose daily.      [name] glyBURIDE (DIABETA) 5 MG tablet Take 5 mg by mouth daily with breakfast.        . labetalol (NORMODYNE) 200 MG tablet Take 200 mg by mouth 2 (two) times daily.        . potassium chloride (KLOR-CON) 20 MEQ packet Take 20 mEq by mouth daily.        . ramipril (ALTACE) 10 MG tablet Take 10 mg by mouth daily.        . risperiDONE (RISPERDAL) 1 MG tablet Take 1 tablet (1 mg total) by mouth 3 (three) times daily.  90 tablet  2  . simvastatin (ZOCOR) 40 MG tablet Take 40 mg by mouth at bedtime.        . temazepam (RESTORIL) 15 MG capsule Take 15 mg by mouth at bedtime.          OB/GYN Status:  No LMP for male patient.  General Assessment Data Location of Assessment: [facility] Services Is this a Tele or Face-to-Face Assessment?: Tele  Assessment Is this an Initial Assessment or a Re-assessment for this encounter?: Initial Assessment Living Arrangements: Other (Comment) (family care home) Can pt return to current living arrangement?: No (pt is moving to a SNL at time of d/c) Admission Status: Voluntary Is patient capable of signing voluntary admission?: Yes Transfer from: Other (Comment) (family care home) Referral Source: MD  Medical Screening Exam [facility] Walk-in ONLY) Medical Exam completed: No Reason for MSE not completed: Other: (None )  [facility] Crisis Care Plan Living Arrangements: Other (Comment) (family care home) Name of Psychiatrist: Diannia Ruder  Name of Therapist: None   Education Status Is patient currently in school?: No Current Grade: None  Highest grade of school patient has completed: None  Name of school: None  Contact person: None   Risk to self Suicidal Ideation: No Suicidal Intent: No Is patient at risk for suicide?: No Suicidal Plan?: No Access to Means: No What has been your use of drugs/alcohol within the last 12 months?: pt denies use Previous Attempts/Gestures: No How many times?: 0 Other Self Harm Risks: none Triggers for Past Attempts:  (n/a) Intentional Self Injurious Behavior: None Family Suicide History: No Recent stressful life event(s):  (n/a) Persecutory voices/beliefs?: No Depression: No Depression Symptoms:  (n/a) Substance abuse history and/or treatment for substance abuse?: No Suicide prevention information given to non-admitted patients: Not applicable  Risk to Others Homicidal Ideation: No Thoughts of Harm to Others: No Current Homicidal Intent: No Current Homicidal Plan: No Access to Homicidal Means: No Identified Victim: none History of harm to others?: No Assessment of Violence: None Noted Violent Behavior Description: none Does patient have access to weapons?: No Criminal Charges Pending?: No Does patient have a court date:  No  Psychosis Hallucinations:  (pt denies) Delusions:  (pt denies)  Mental Status Report Appear/Hygiene: Other (Comment) (appropriate) Eye Contact: Fair Motor Activity: Freedom of movement Speech: Tangential Level of Consciousness: Alert Mood: Preoccupied Affect: Preoccupied Anxiety Level: None Thought Processes: Tangential Judgement: Unimpaired Orientation: Person;Place Obsessive Compulsive Thoughts/Behaviors: None  Cognitive Functioning Concentration: Decreased Memory: Recent Impaired;Remote Impaired IQ: Average Insight: Poor Impulse Control: Poor Appetite: Good Weight Loss: 0 Weight Gain: 0 Sleep: No Change Total Hours of Sleep: 7 Vegetative Symptoms: None  ADLScreening [facility] Assessment Services) Patient's cognitive ability adequate to safely complete daily activities?: No Patient able to express need for assistance with ADLs?: Yes Independently performs  ADLs?: No  Prior Inpatient Therapy Prior Inpatient Therapy: Yes Prior Therapy Dates: 2008 Prior Therapy Facilty/Provider(s): [facility] Reason for Treatment: bipolar d/o  Prior Outpatient Therapy Prior Outpatient Therapy: Yes Prior Therapy Dates: currently Prior Therapy Facilty/Provider(s): Diannia Ruder  Reason for Treatment: Med Mgt   ADL Screening (condition at time of admission) Patient's cognitive ability adequate to safely complete daily activities?: No Is the patient deaf or have difficulty hearing?: No Does the patient have difficulty seeing, even when wearing glasses/contacts?: No Does the patient have difficulty concentrating, remembering, or making decisions?: No Patient able to express need for assistance with ADLs?: Yes Does the patient have difficulty dressing or bathing?: Yes Independently performs ADLs?: No Communication: Needs assistance (schizophrenia) Is this a change from baseline?: Pre-admission baseline Dressing (OT): Needs assistance Is this a change from baseline?: Pre-admission  baseline Grooming: Needs assistance Is this a change from baseline?: Pre-admission baseline Feeding: Needs assistance Is this a change from baseline?: Pre-admission baseline Bathing: Needs assistance Is this a change from baseline?: Pre-admission baseline Toileting: Dependent Is this a change from baseline?: Pre-admission baseline In/Out Bed: Dependent Is this a change from baseline?: Change from baseline, expected to last <3 days Walks in Home: Dependent Is this a change from baseline?: Change from baseline, expected to last <3 days Does the patient have difficulty walking or climbing stairs?: Yes Weakness of Legs: Both Weakness of Arms/Hands: Both  Home Assistive Devices/Equipment Home Assistive Devices/Equipment: None  Therapy Consults (therapy consults require a physician order) PT Evaluation Needed: Yes (Comment) (md aware and placed order) OT Evalulation Needed: Yes (Comment) SLP Evaluation Needed: No Abuse/Neglect Assessment (Assessment to be complete while patient is alone) Physical Abuse: Denies Verbal Abuse: Denies Sexual Abuse: Denies Exploitation of patient/patient's resources: Denies Self-Neglect: Denies Values / Beliefs Cultural Requests During Hospitalization: None Spiritual Requests During Hospitalization: None Consults Spiritual Care Consult Needed: No Social Work Consult Needed: Yes (Comment) Advance Directives (For Healthcare) Advance Directive: Patient does not have advance directive;Patient would not like information Pre-existing out of facility DNR order (yellow form or pink MOST form): No Nutrition Screen- [facility] Adult/WL/AP Patient's home diet: Regular Have you recently lost weight without trying?: No Have you been eating poorly because of a decreased appetite?: Yes Malnutrition Screening Tool Score: 1  Additional Information 1:1 In Past 12 Months?: No CIRT Risk: No Elopement Risk: No Does patient have medical clearance?: Yes     Disposition:   Disposition Initial Assessment Completed for this Encounter: Yes Disposition of Patient: Other dispositions Type of inpatient treatment program: Adult Other disposition(s): Referred to outside facility (pt will be d/c and transferred to SNF) Patient referred to: Other (Comment) [facility])  [employer], Jasmarie Coppock P 06/19/2013 10:07 AM

## 2013-06-19 NOTE — Clinical Social Work Placement (Signed)
Clinical Social Work Department CLINICAL SOCIAL WORK PLACEMENT NOTE 06/19/2013  Patient:  Derrick Butler, Derrick Butler  Account Number:  [id number] Admit date:  06/07/2013  Clinical Social Worker:  Sherrlyn Hock  Date/time:  06/17/2013 11:57 AM  Clinical Social Work is seeking post-discharge placement for this patient at the following level of care:   SKILLED NURSING   (*CSW will update this form in Epic as items are completed)   06/17/2013  Patient/family provided with [facility] Department of Clinical Social Work's list of facilities offering this level of care within the geographic area requested by the patient (or if unable, by the patient's family).  06/17/2013  Patient/family informed of their freedom to choose among providers that offer the needed level of care, that participate in Medicare, Medicaid or managed care program needed by the patient, have an available bed and are willing to accept the patient.  06/17/2013  Patient/family informed of [facility]' ownership interest in [facility], as well as of the fact that they are under no obligation to receive care at this facility.  PASARR submitted to EDS on 06/17/2013 PASARR number received from EDS on 06/19/2013  FL2 transmitted to all facilities in geographic area requested by pt/family on  06/17/2013 FL2 transmitted to all facilities within larger geographic area on   Patient informed that his/her managed care company has contracts with or will negotiate with  certain facilities, including the following:     Patient/family informed of bed offers received:  06/17/2013 Patient chooses bed at [facility] [place] Physician recommends and patient chooses bed at  [facility] [place]  Patient to be transferred to [facility] [place] on  06/19/2013 Patient to be transferred to facility by [facility]  The following physician request were entered in Epic:   Additional Comments: Will require face-to-face pasarr  evaluation.  Derenda Fennel, [place] [phone]

## 2013-06-19 NOTE — Discharge Summary (Signed)
Physician Discharge Summary  Patient ID: Derrick Butler MRN: [id number] DOB/AGE: 10-30-1955 57 y.o. Primary Care Physician:Evola Hollis L, MD Admit date: 06/07/2013 Discharge date: 06/19/2013    Discharge Diagnoses:   Active Problems:   HYPERTENSION, UNSPECIFIED   CARDIOMYOPATHY OTHER DISEASES CLASSIFIED ELSW   AORTIC DISSECTION   Bipolar I disorder, most recent episode mixed   H/O: stroke   Altered mental status   Dehydration diabetes mellitus    Medication List    ASK your doctor about these medications       aspirin 325 MG tablet  Take 325 mg by mouth daily.     benztropine 1 MG tablet  Commonly known as:  COGENTIN  Take 1 mg by mouth 3 (three) times daily.     cloNIDine 0.3 MG tablet  Commonly known as:  CATAPRES  Take 0.3 mg by mouth daily.     divalproex 250 MG DR tablet  Commonly known as:  DEPAKOTE  Take 250 mg by mouth 3 (three) times daily.     fluticasone 50 MCG/ACT nasal spray  Commonly known as:  FLONASE  Place 2 sprays into the nose daily.     glyBURIDE 5 MG tablet  Commonly known as:  DIABETA  Take 5 mg by mouth daily with breakfast.     labetalol 200 MG tablet  Commonly known as:  NORMODYNE  Take 200 mg by mouth 2 (two) times daily.     potassium chloride 20 MEQ packet  Commonly known as:  KLOR-CON  Take 20 mEq by mouth daily.     ramipril 10 MG tablet  Commonly known as:  ALTACE  Take 10 mg by mouth daily.     risperiDONE 1 MG tablet  Commonly known as:  RISPERDAL  Take 1 tablet (1 mg total) by mouth 3 (three) times daily.     simvastatin 40 MG tablet  Commonly known as:  ZOCOR  Take 40 mg by mouth at bedtime.     temazepam 15 MG capsule  Commonly known as:  RESTORIL  Take 15 mg by mouth at bedtime.        Discharged Condition: Improved    Consults: Psychiatry  Significant Diagnostic Studies: Dg Chest 1 View  06/14/2013   *RADIOLOGY REPORT*  Clinical Data: Weakness, tremors, confusion and posterior  CHEST - 1  VIEW  Comparison: Chest radiograph performed 06/10/2013  Findings: The left lung is significantly hypoexpanded.  Left-sided airspace opacity raises concern for pneumonia, given the patient's symptoms.  Minimal right basilar opacity is also seen.  A small left pleural effusion cannot be excluded.  No pneumothorax is seen.  The cardiomediastinal silhouette is borderline normal in size; the patient is status post median sternotomy.  No acute osseous abnormalities are seen.  IMPRESSION: Left-sided airspace opacification raises concern for pneumonia, given the patient's symptoms; minimal right basilar airspace opacity also seen.  Possible small left pleural effusion.   Original Report Authenticated By: Tonia Ghent, M.D.   Dg Chest 2 View  06/10/2013   *RADIOLOGY REPORT*  Clinical Data: Medical clearance.  CHEST - 2 VIEW  Comparison: Chest radiograph performed 02/21/2010  Findings: The lungs are hypoexpanded.  An apparent small left pleural effusion is noted, with associated atelectasis.  No pneumothorax is seen.  The heart is borderline normal in size; the patient is status post median sternotomy.  No acute osseous abnormalities are seen.  IMPRESSION: Lungs hypoexpanded; apparent small left pleural effusion, with associated atelectasis.   Original Report Authenticated By: Tonia Ghent,  M.D.   Ct Head Wo Contrast  06/14/2013   *RADIOLOGY REPORT*  Clinical Data: Altered mental status, history of cerebrovascular accident  CT HEAD WITHOUT CONTRAST  Technique:  Contiguous axial images were obtained from the base of the skull through the vertex without contrast.  Comparison: None.  Findings: Encephalomalacia within the right parietal and occipital lobes is present, most compatible with remote right PCA infarct. There may be some right MCA involvement as well.  There is associated ex vacuo dilatation of the right lateral ventricle.  Additional remote lacunar infarct is present within the left basal ganglia with  superior extension into the left periventricular white matter.  No acute intracranial hemorrhage or infarct is identified.  No midline shift or mass lesion.  There is no extra-axial fluid collection.  Orbital soft tissues are within normal limits.  Prominent calcified atheromatous disease is present within the visualized vertebral arteries.  Calvarium is intact.  Paranasal sinuses and mastoid air cells are clear.  IMPRESSION: Remote right parieto-occipital ischemic infarct and chronic lacunar infarct within the left basal ganglia.  No acute intracranial process identified.   Original Report Authenticated By: Rise Mu, M.D.   Dg Chest Port 1 View  06/17/2013   CLINICAL DATA:  Shortness of breath, history hypertension, diabetes, coronary artery disease, cardiomyopathy, aortic dissection, stroke  EXAM: PORTABLE CHEST - 1 VIEW  COMPARISON:  Portable exam 0815 hr compared to 06/14/2013  FINDINGS: Enlargement of cardiac silhouette post median sternotomy.  Slight rotation to the left.  Tortuous aorta.  Pulmonary vascularity normal.  Low lung volumes with bibasilar atelectasis.  Remaining lungs clear.  No gross pleural effusion or pneumothorax.  Bones demineralized.  IMPRESSION: Mild enlargement of cardiac silhouette.  Low lung volumes with bibasilar atelectasis.   Electronically Signed   By: Ulyses Southward M.D.   On: 06/17/2013 08:22    Lab Results: Basic Metabolic Panel:  Recent Labs  16/10/96 0933 06/18/13 0609  NA 141 140  K 4.6 4.2  CL 105 106  CO2 24 25  GLUCOSE 132* 108*  BUN 35* 31*  CREATININE 2.24* 2.01*  CALCIUM 9.7 8.7   Liver Function Tests:  Recent Labs  06/16/13 0933  AST 20  ALT 13  ALKPHOS 61  BILITOT 0.3  PROT 7.1  ALBUMIN 3.4*     CBC:  Recent Labs  06/16/13 0933 06/18/13 0609  WBC 6.3 4.3  NEUTROABS 3.5  --   HGB 13.9 11.0*  HCT 40.5 31.8*  MCV 89.8 89.3  PLT 118* 101*    Recent Results (from the past 240 hour(s))  URINE CULTURE     Status: None    Collection Time    06/13/13 11:15 PM      Result Value Range Status   Specimen Description URINE, CATHETERIZED   Final   Special Requests NONE   Final   Culture  Setup Time     Final   Value: 06/14/2013 22:04     Performed at [facility] Count     Final   Value: NO GROWTH     Performed at [facility]   Culture     Final   Value: NO GROWTH     Performed at [facility]   Report Status 06/15/2013 FINAL   Final     Hospital Course: This is a 57 year old who has a long known history of bipolar disease hypertension diabetes and complications from his hypertension and diabetes. He has had a stroke  in the past and an aortic aneurysm. He had been doing relatively well at a group home, and about a week prior to coming to the hospital had medication changes. He then became combative much more agitated and eventually was brought to the emergency room because he could no longer be managed at his current level of care. He remained in the emergency room for about 8 days trying to get some sort of disposition and then I was asked to see him. He appeared to be dehydrated. There was some question of urinary tract infection and there was even a question as to whether he had a pneumonia on chest x-ray. He was admitted from the emergency department start on IV fluids started on a antibiotics and improved. He is cleared medically for transfer to skilled care facility. He is felt to need skilled care facility for rehabilitation.  Discharge Exam: Blood pressure 163/77, pulse 55, temperature 98.3 F (36.8 C), temperature source Oral, resp. rate 18, height 5' 8.11" (1.73 m), weight 76.3 kg (168 lb 3.4 oz), SpO2 100.00%. He is awake and alert. His chest is clear. His heart is regular  Disposition: To skilled care facility when bed is available      Signed: Maynor Mwangi L Pager [phone]  06/19/2013, 9:06 AM

## 2013-06-19 NOTE — Progress Notes (Signed)
Subjective: He is overall about the same. We're waiting for psychiatric clearance to send him to the skilled care facility. He is cleared medically. He's been minimally more agitated last night and today and has pulled out his IV and I will simply leave that out.  Objective: Vital signs in last 24 hours: Temp:  [98.1 F (36.7 C)-98.6 F (37 C)] 98.3 F (36.8 C) (10/16 0644) Pulse Rate:  [55-67] 55 (10/16 0644) Resp:  [16-18] 18 (10/16 0644) BP: (108-163)/(67-77) 163/77 mmHg (10/16 0644) SpO2:  [98 %-100 %] 100 % (10/16 0644) Weight change:  Last BM Date: 06/18/13  Intake/Output from previous day: 10/15 0701 - 10/16 0700 In: 4420 [P.O.:360; I.V.:3910; IV Piggyback:150] Out: 1200 [Urine:1200]  PHYSICAL EXAM General appearance: alert and mild distress Resp: clear to auscultation bilaterally Cardio: regular rate and rhythm, S1, S2 normal, no murmur, click, rub or gallop GI: soft, non-tender; bowel sounds normal; no masses,  no organomegaly Extremities: extremities normal, atraumatic, no cyanosis or edema  Lab Results:    Basic Metabolic Panel:  Recent Labs  16/10/96 0933 06/18/13 0609  NA 141 140  K 4.6 4.2  CL 105 106  CO2 24 25  GLUCOSE 132* 108*  BUN 35* 31*  CREATININE 2.24* 2.01*  CALCIUM 9.7 8.7   Liver Function Tests:  Recent Labs  06/16/13 0933  AST 20  ALT 13  ALKPHOS 61  BILITOT 0.3  PROT 7.1  ALBUMIN 3.4*   No results found for this basename: LIPASE, AMYLASE,  in the last 72 hours No results found for this basename: AMMONIA,  in the last 72 hours CBC:  Recent Labs  06/16/13 0933 06/18/13 0609  WBC 6.3 4.3  NEUTROABS 3.5  --   HGB 13.9 11.0*  HCT 40.5 31.8*  MCV 89.8 89.3  PLT 118* 101*   Cardiac Enzymes: No results found for this basename: CKTOTAL, CKMB, CKMBINDEX, TROPONINI,  in the last 72 hours BNP: No results found for this basename: PROBNP,  in the last 72 hours D-Dimer: No results found for this basename: DDIMER,  in the last  72 hours CBG:  Recent Labs  06/17/13 2046 06/18/13 0730 06/18/13 1210 06/18/13 1706 06/18/13 2115 06/19/13 0736  GLUCAP 169* 99 165* 196* 160* 114*   Hemoglobin A1C: No results found for this basename: HGBA1C,  in the last 72 hours Fasting Lipid Panel: No results found for this basename: CHOL, HDL, LDLCALC, TRIG, CHOLHDL, LDLDIRECT,  in the last 72 hours Thyroid Function Tests: No results found for this basename: TSH, T4TOTAL, FREET4, T3FREE, THYROIDAB,  in the last 72 hours Anemia Panel: No results found for this basename: VITAMINB12, FOLATE, FERRITIN, TIBC, IRON, RETICCTPCT,  in the last 72 hours Coagulation: No results found for this basename: LABPROT, INR,  in the last 72 hours Urine Drug Screen: Drugs of Abuse     Component Value Date/Time   LABOPIA NONE DETECTED 06/08/2013 0518   COCAINSCRNUR NONE DETECTED 06/08/2013 0518   LABBENZ NONE DETECTED 06/08/2013 0518   AMPHETMU NONE DETECTED 06/08/2013 0518   THCU NONE DETECTED 06/08/2013 0518   LABBARB NONE DETECTED 06/08/2013 0518    Alcohol Level: No results found for this basename: ETH,  in the last 72 hours Urinalysis: No results found for this basename: COLORURINE, APPERANCEUR, LABSPEC, PHURINE, GLUCOSEU, HGBUR, BILIRUBINUR, KETONESUR, PROTEINUR, UROBILINOGEN, NITRITE, LEUKOCYTESUR,  in the last 72 hours Misc. Labs:  ABGS No results found for this basename: PHART, PCO2, PO2ART, TCO2, HCO3,  in the last 72 hours CULTURES Recent  Results (from the past 240 hour(s))  URINE CULTURE     Status: None   Collection Time    06/13/13 11:15 PM      Result Value Range Status   Specimen Description URINE, CATHETERIZED   Final   Special Requests NONE   Final   Culture  Setup Time     Final   Value: 06/14/2013 22:04     Performed at [facility] Count     Final   Value: NO GROWTH     Performed at [facility]   Culture     Final   Value: NO GROWTH     Performed at [facility]   Report  Status 06/15/2013 FINAL   Final   Studies/Results: No results found.  Medications:  Prior to Admission:  Prescriptions prior to admission  Medication Sig Dispense Refill  . aspirin 325 MG tablet Take 325 mg by mouth daily.        . benztropine (COGENTIN) 1 MG tablet Take 1 mg by mouth 3 (three) times daily.      . cloNIDine (CATAPRES) 0.3 MG tablet Take 0.3 mg by mouth daily.        . divalproex (DEPAKOTE) 250 MG DR tablet Take 250 mg by mouth 3 (three) times daily.       . fluticasone (FLONASE) 50 MCG/ACT nasal spray Place 2 sprays into the nose daily.      [name] glyBURIDE (DIABETA) 5 MG tablet Take 5 mg by mouth daily with breakfast.        . labetalol (NORMODYNE) 200 MG tablet Take 200 mg by mouth 2 (two) times daily.        . potassium chloride (KLOR-CON) 20 MEQ packet Take 20 mEq by mouth daily.        . ramipril (ALTACE) 10 MG tablet Take 10 mg by mouth daily.        . risperiDONE (RISPERDAL) 1 MG tablet Take 1 tablet (1 mg total) by mouth 3 (three) times daily.  90 tablet  2  . simvastatin (ZOCOR) 40 MG tablet Take 40 mg by mouth at bedtime.        . temazepam (RESTORIL) 15 MG capsule Take 15 mg by mouth at bedtime.         Scheduled: . aspirin  325 mg Oral Daily  . benztropine  1 mg Oral TID  . cloNIDine  0.3 mg Oral Daily  . divalproex  250 mg Oral TID  . enoxaparin (LOVENOX) injection  40 mg Subcutaneous Q24H  . feeding supplement (ENSURE COMPLETE)  237 mL Oral TID WC  . fluticasone  2 spray Each Nare Daily  . labetalol  100 mg Oral BID  . levofloxacin  250 mg Oral Daily  . ramipril  5 mg Oral Daily  . risperiDONE  1 mg Oral TID  . simvastatin  40 mg Oral QHS  . temazepam  15 mg Oral QHS   Continuous: . sodium chloride 75 mL/hr at 06/19/13 0102   PRN:  Assesment: He was admitted with altered mental status. He has bipolar disorder. He has history of stroke. He has improved. He spent approximately 8 days in the emergency department waiting for psychiatric placement. I  think he was somewhat dehydrated and he is better now. He is ready for placement from a medical point of view Active Problems:   HYPERTENSION, UNSPECIFIED   CARDIOMYOPATHY OTHER DISEASES CLASSIFIED ELSW   AORTIC DISSECTION  Bipolar I disorder, most recent episode mixed   H/O: stroke   Altered mental status    Plan: He is ready for transfer to skilled care facility when he is cleared from a psychiatric point of view    LOS: 12 days   Derrick Butler 06/19/2013, 8:46 AM

## 2013-06-19 NOTE — Progress Notes (Signed)
Patient pulled IV out of left hand.  Site WNL.  Dr. Juanetta Gosling on unit and notified.  Dr. Juanetta Gosling stated to leave IV out.  All meds are now po.

## 2013-06-30 ENCOUNTER — Ambulatory Visit (HOSPITAL_COMMUNITY): Payer: Self-pay | Admitting: Psychiatry

## 2013-08-18 ENCOUNTER — Encounter (HOSPITAL_COMMUNITY): Payer: Self-pay | Admitting: Emergency Medicine

## 2013-08-18 ENCOUNTER — Emergency Department (HOSPITAL_COMMUNITY)
Admission: EM | Admit: 2013-08-18 | Discharge: 2013-08-19 | Disposition: A | Discharge status: Home / Self Care | Payer: Medicare Other | Attending: Emergency Medicine | Admitting: Emergency Medicine

## 2013-08-18 DIAGNOSIS — IMO0002 Reserved for concepts with insufficient information to code with codable children: Secondary | ICD-10-CM | POA: Insufficient documentation

## 2013-08-18 DIAGNOSIS — E875 Hyperkalemia: Secondary | ICD-10-CM | POA: Insufficient documentation

## 2013-08-18 DIAGNOSIS — Z79899 Other long term (current) drug therapy: Secondary | ICD-10-CM | POA: Insufficient documentation

## 2013-08-18 DIAGNOSIS — Z7982 Long term (current) use of aspirin: Secondary | ICD-10-CM | POA: Insufficient documentation

## 2013-08-18 DIAGNOSIS — R4182 Altered mental status, unspecified: Secondary | ICD-10-CM | POA: Insufficient documentation

## 2013-08-18 DIAGNOSIS — Z8673 Personal history of transient ischemic attack (TIA), and cerebral infarction without residual deficits: Secondary | ICD-10-CM | POA: Insufficient documentation

## 2013-08-18 DIAGNOSIS — F319 Bipolar disorder, unspecified: Secondary | ICD-10-CM | POA: Insufficient documentation

## 2013-08-18 DIAGNOSIS — I2510 Atherosclerotic heart disease of native coronary artery without angina pectoris: Secondary | ICD-10-CM | POA: Insufficient documentation

## 2013-08-18 DIAGNOSIS — I10 Essential (primary) hypertension: Secondary | ICD-10-CM | POA: Insufficient documentation

## 2013-08-18 DIAGNOSIS — E119 Type 2 diabetes mellitus without complications: Secondary | ICD-10-CM | POA: Insufficient documentation

## 2013-08-18 DIAGNOSIS — R0902 Hypoxemia: Secondary | ICD-10-CM

## 2013-08-18 NOTE — ED Notes (Signed)
EMS arrives with pt on Room air 02 sat 100%, [facility] called out states pt sats were 72% put on non-re breather and brought up to 96%. Pt hands are cold. Pt at baseline per EMS.

## 2013-08-19 ENCOUNTER — Emergency Department (HOSPITAL_COMMUNITY): Payer: Medicare Other

## 2013-08-19 NOTE — ED Provider Notes (Signed)
CSN: [id number]     Arrival date & time 08/18/13  2312 History   First MD Initiated Contact with Patient 08/19/13 0159     Chief Complaint  Patient presents with  . low 02 sat    (Consider location/radiation/quality/duration/timing/severity/associated sxs/prior Treatment) The history is provided by the EMS personnel and the nursing home. The history is limited by the condition of the patient.   Level V caveat applies to the patient's altered mental status that is baseline. Patient is unresponsive at all times.  Patient sent in from nursing facility for sudden drop in oxygen saturation down to 72%. EMS placed him on her percent nonrebreather. Here in the emergency department patient off all oxygen sats are percent patient in no apparent distress.  Past Medical History  Diagnosis Date  . Aortic dissection   . Hypertension   . CVA (cerebral infarction)   . Aortic dissection   . Hypertension   . CVA (cerebral infarction)   . Diabetes mellitus   . Cardiomyopathy   . Hyperglycemia   . Bipolar 1 disorder   . Hyperkalemia   . Coronary artery disease   . Hyponatremia   . Altered mental status     with psychosis   Past Surgical History  Procedure Laterality Date  . Ascending aortic aneurysm repair     Family History  Problem Relation Age of Onset  . Alzheimer's disease Mother   . Stroke Father   . Heart disease Father   . Bipolar disorder Father   . Bipolar disorder Sister   . Bipolar disorder Sister    History  Substance Use Topics  . Smoking status: Never Smoker   . Smokeless tobacco: Not on file  . Alcohol Use: No    Review of Systems  Unable to perform ROS  level V caveat applies to the patient's baseline mental status. Patient is not responsive. This is baseline.  Allergies  Review of patient's allergies indicates no known allergies.  Home Medications   Current Outpatient Rx  Name  Route  Sig  Dispense  Refill  . aspirin 325 MG tablet   Oral   Take 325 mg  by mouth daily.           . benztropine (COGENTIN) 1 MG tablet   Oral   Take 1 mg by mouth 3 (three) times daily.         . cloNIDine (CATAPRES) 0.3 MG tablet   Oral   Take 0.3 mg by mouth daily.           . divalproex (DEPAKOTE) 250 MG DR tablet   Oral   Take 250 mg by mouth 3 (three) times daily.          . fluticasone (FLONASE) 50 MCG/ACT nasal spray   Nasal   Place 2 sprays into the nose daily.         [name] glyBURIDE (DIABETA) 5 MG tablet   Oral   Take 5 mg by mouth daily with breakfast.           . labetalol (NORMODYNE) 200 MG tablet   Oral   Take 200 mg by mouth 2 (two) times daily.           . potassium chloride (KLOR-CON) 20 MEQ packet   Oral   Take 20 mEq by mouth daily.           . ramipril (ALTACE) 10 MG tablet   Oral   Take 10 mg by mouth daily.           [name]  risperiDONE (RISPERDAL) 1 MG tablet   Oral   Take 1 tablet (1 mg total) by mouth 3 (three) times daily.   90 tablet   2   . simvastatin (ZOCOR) 40 MG tablet   Oral   Take 40 mg by mouth at bedtime.           . temazepam (RESTORIL) 15 MG capsule   Oral   Take 15 mg by mouth at bedtime.           . enoxaparin (LOVENOX) 40 MG/0.4ML injection   Subcutaneous   Inject 0.4 mLs (40 mg total) into the skin daily.   0 Syringe      . levofloxacin (LEVAQUIN) 250 MG tablet   Oral   Take 1 tablet (250 mg total) by mouth daily.   5 tablet   0    BP 104/66  Temp(Src) 97.8 F (36.6 C) (Axillary)  Ht 6' (1.829 m)  Wt 168 lb (76.204 kg)  BMI 22.78 kg/m2  SpO2 100% Physical Exam  Nursing note and vitals reviewed. Constitutional: No distress.  HENT:  Head: Normocephalic and atraumatic.  Eyes: Conjunctivae are normal. Pupils are equal, round, and reactive to light.  Neck: Normal range of motion.  Cardiovascular: Normal rate, regular rhythm and normal heart sounds.   No murmur heard. Pulmonary/Chest: Effort normal and breath sounds normal. No respiratory distress. He has no  wheezes. He has no rales.  Abdominal: Soft. Bowel sounds are normal. There is no tenderness.  Musculoskeletal: Normal range of motion. He exhibits no edema.  Neurological:  Baseline mental status for this patient. Will spontaneously move all extremities.  Skin: Skin is warm. No rash noted.    ED Course  Procedures (including critical care time) Labs Review Labs Reviewed - No data to display Imaging Review Dg Chest 1 View  08/19/2013   CLINICAL DATA:  Shortness of breath and hypoxia  EXAM: CHEST - 1 VIEW  COMPARISON:  06/17/2013  FINDINGS: Stable cardiac enlargement. Previous median sternotomy for aortic root replacement. Unchanged enlargement and tortuosity of the aorta. Clear lungs. No effusion or pneumothorax.  IMPRESSION: No evidence of active cardiopulmonary disease.   Electronically Signed   By: Tiburcio Pea M.D.   On: 08/19/2013 02:54    EKG Interpretation   None       MDM   1. Hypoxia    Patient's oxygen saturations in the emergency department 100% on room air. No rapid breathing no respiratory distress. Patient has a history of significant dementia or unresponsiveness. He is at baseline with that. Chest x-ray is negative for pneumothorax or pulmonary edema. Patient will be discharged back to nursing facility. Do not have an explanation for the significant drop in oxygen saturation noted there.    Shelda Jakes, MD 08/19/13 [phone]

## 2013-08-19 NOTE — ED Notes (Signed)
Pt taking back to [facility] by EMS, discharged by Jeannette How Rn

## 2013-08-19 NOTE — ED Notes (Signed)
Nursing assistant from [facility] at the bedside. State pt jerking motion is normal. States pt does not talk much. Pt did have couple episodes of 02 dropping to 88%, repositioned monitor and sat goes back to 100%. Pt jerking  And moving more in bed than on arrival. Per NA, this is normal.

## 2013-09-06 ENCOUNTER — Emergency Department (HOSPITAL_COMMUNITY): Payer: Medicare Other

## 2013-09-06 ENCOUNTER — Inpatient Hospital Stay (HOSPITAL_COMMUNITY)
Admission: EM | Admit: 2013-09-06 | Discharge: 2013-09-11 | DRG: 689 | Disposition: A | Discharge status: Skilled Nursing Facility | Payer: Medicare Other | Attending: Pulmonary Disease | Admitting: Pulmonary Disease

## 2013-09-06 ENCOUNTER — Encounter (HOSPITAL_COMMUNITY): Payer: Self-pay | Admitting: Emergency Medicine

## 2013-09-06 DIAGNOSIS — I672 Cerebral atherosclerosis: Secondary | ICD-10-CM | POA: Diagnosis present

## 2013-09-06 DIAGNOSIS — Z823 Family history of stroke: Secondary | ICD-10-CM

## 2013-09-06 DIAGNOSIS — Z8249 Family history of ischemic heart disease and other diseases of the circulatory system: Secondary | ICD-10-CM

## 2013-09-06 DIAGNOSIS — E43 Unspecified severe protein-calorie malnutrition: Secondary | ICD-10-CM | POA: Diagnosis present

## 2013-09-06 DIAGNOSIS — Z681 Body mass index (BMI) 19 or less, adult: Secondary | ICD-10-CM

## 2013-09-06 DIAGNOSIS — G9340 Encephalopathy, unspecified: Secondary | ICD-10-CM

## 2013-09-06 DIAGNOSIS — E785 Hyperlipidemia, unspecified: Secondary | ICD-10-CM | POA: Diagnosis present

## 2013-09-06 DIAGNOSIS — N183 Chronic kidney disease, stage 3 unspecified: Secondary | ICD-10-CM | POA: Diagnosis present

## 2013-09-06 DIAGNOSIS — N390 Urinary tract infection, site not specified: Principal | ICD-10-CM | POA: Diagnosis present

## 2013-09-06 DIAGNOSIS — B958 Unspecified staphylococcus as the cause of diseases classified elsewhere: Secondary | ICD-10-CM | POA: Diagnosis present

## 2013-09-06 DIAGNOSIS — I69319 Unspecified symptoms and signs involving cognitive functions following cerebral infarction: Secondary | ICD-10-CM

## 2013-09-06 DIAGNOSIS — F3160 Bipolar disorder, current episode mixed, unspecified: Secondary | ICD-10-CM | POA: Diagnosis present

## 2013-09-06 DIAGNOSIS — F0150 Vascular dementia without behavioral disturbance: Secondary | ICD-10-CM | POA: Diagnosis present

## 2013-09-06 DIAGNOSIS — Z820 Family history of epilepsy and other diseases of the nervous system: Secondary | ICD-10-CM

## 2013-09-06 DIAGNOSIS — I43 Cardiomyopathy in diseases classified elsewhere: Secondary | ICD-10-CM | POA: Diagnosis present

## 2013-09-06 DIAGNOSIS — I10 Essential (primary) hypertension: Secondary | ICD-10-CM | POA: Diagnosis present

## 2013-09-06 DIAGNOSIS — Z818 Family history of other mental and behavioral disorders: Secondary | ICD-10-CM

## 2013-09-06 DIAGNOSIS — Z7982 Long term (current) use of aspirin: Secondary | ICD-10-CM

## 2013-09-06 DIAGNOSIS — E860 Dehydration: Secondary | ICD-10-CM

## 2013-09-06 DIAGNOSIS — N179 Acute kidney failure, unspecified: Secondary | ICD-10-CM | POA: Diagnosis present

## 2013-09-06 DIAGNOSIS — I129 Hypertensive chronic kidney disease with stage 1 through stage 4 chronic kidney disease, or unspecified chronic kidney disease: Secondary | ICD-10-CM | POA: Diagnosis present

## 2013-09-06 DIAGNOSIS — Z8673 Personal history of transient ischemic attack (TIA), and cerebral infarction without residual deficits: Secondary | ICD-10-CM

## 2013-09-06 DIAGNOSIS — R4182 Altered mental status, unspecified: Secondary | ICD-10-CM | POA: Diagnosis present

## 2013-09-06 HISTORY — DX: Cardiomyopathy, unspecified: I42.9

## 2013-09-06 HISTORY — DX: Dehydration: E86.0

## 2013-09-06 HISTORY — DX: Unspecified lack of coordination: R27.9

## 2013-09-06 HISTORY — DX: Transient cerebral ischemic attack, unspecified: G45.9

## 2013-09-06 LAB — AMMONIA: Ammonia: 10 umol/L — ABNORMAL LOW (ref 11–60)

## 2013-09-06 LAB — URINALYSIS, ROUTINE W REFLEX MICROSCOPIC
Bilirubin Urine: NEGATIVE
Glucose, UA: NEGATIVE mg/dL
KETONES UR: NEGATIVE mg/dL
Nitrite: POSITIVE — AB
Protein, ur: 30 mg/dL — AB
Specific Gravity, Urine: 1.025 (ref 1.005–1.030)
UROBILINOGEN UA: 0.2 mg/dL (ref 0.0–1.0)
pH: 6 (ref 5.0–8.0)

## 2013-09-06 LAB — COMPREHENSIVE METABOLIC PANEL
ALT: 28 U/L (ref 0–53)
AST: 58 U/L — ABNORMAL HIGH (ref 0–37)
Albumin: 3 g/dL — ABNORMAL LOW (ref 3.5–5.2)
Alkaline Phosphatase: 71 U/L (ref 39–117)
BILIRUBIN TOTAL: 0.5 mg/dL (ref 0.3–1.2)
BUN: 76 mg/dL — ABNORMAL HIGH (ref 6–23)
CALCIUM: 9.9 mg/dL (ref 8.4–10.5)
CO2: 24 meq/L (ref 19–32)
CREATININE: 2.63 mg/dL — AB (ref 0.50–1.35)
Chloride: 104 mEq/L (ref 96–112)
GFR calc Af Amer: 30 mL/min — ABNORMAL LOW (ref >90)
GFR, EST NON AFRICAN AMERICAN: 26 mL/min — AB (ref >90)
GLUCOSE: 134 mg/dL — AB (ref 70–99)
Potassium: 4.9 mEq/L (ref 3.7–5.3)
Sodium: 143 mEq/L (ref 137–147)
Total Protein: 6.8 g/dL (ref 6.0–8.3)

## 2013-09-06 LAB — BLOOD GAS, ARTERIAL
Acid-base deficit: 3.8 mmol/L — ABNORMAL HIGH (ref 0.0–2.0)
BICARBONATE: 20 meq/L (ref 20.0–24.0)
FIO2: 21 %
O2 Saturation: 97.9 %
PATIENT TEMPERATURE: 37
TCO2: 18.3 mmol/L (ref 0–100)
pCO2 arterial: 32 mmHg — ABNORMAL LOW (ref 35.0–45.0)
pH, Arterial: 7.414 (ref 7.350–7.450)
pO2, Arterial: 102 mmHg — ABNORMAL HIGH (ref 80.0–100.0)

## 2013-09-06 LAB — CBC WITH DIFFERENTIAL/PLATELET
Basophils Absolute: 0 10*3/uL (ref 0.0–0.1)
Basophils Relative: 0 % (ref 0–1)
Eosinophils Absolute: 0.1 10*3/uL (ref 0.0–0.7)
Eosinophils Relative: 1 % (ref 0–5)
HEMATOCRIT: 36.2 % — AB (ref 39.0–52.0)
HEMOGLOBIN: 12.2 g/dL — AB (ref 13.0–17.0)
LYMPHS ABS: 1.2 10*3/uL (ref 0.7–4.0)
LYMPHS PCT: 18 % (ref 12–46)
MCH: 30.8 pg (ref 26.0–34.0)
MCHC: 33.7 g/dL (ref 30.0–36.0)
MCV: 91.4 fL (ref 78.0–100.0)
MONO ABS: 0.6 10*3/uL (ref 0.1–1.0)
Monocytes Relative: 8 % (ref 3–12)
Neutro Abs: 4.9 10*3/uL (ref 1.7–7.7)
Neutrophils Relative %: 72 % (ref 43–77)
Platelets: 132 10*3/uL — ABNORMAL LOW (ref 150–400)
RBC: 3.96 MIL/uL — AB (ref 4.22–5.81)
RDW: 13.4 % (ref 11.5–15.5)
WBC: 6.8 10*3/uL (ref 4.0–10.5)

## 2013-09-06 LAB — URINE MICROSCOPIC-ADD ON

## 2013-09-06 LAB — SEDIMENTATION RATE: Sed Rate: 62 mm/hr — ABNORMAL HIGH (ref 0–16)

## 2013-09-06 LAB — LACTIC ACID, PLASMA: Lactic Acid, Venous: 2.4 mmol/L — ABNORMAL HIGH (ref 0.5–2.2)

## 2013-09-06 LAB — VALPROIC ACID LEVEL: Valproic Acid Lvl: 14.8 ug/mL — ABNORMAL LOW (ref 50.0–100.0)

## 2013-09-06 LAB — TROPONIN I

## 2013-09-06 MED ORDER — PIPERACILLIN-TAZOBACTAM 3.375 G IVPB
3.3750 g | Freq: Once | INTRAVENOUS | Status: AC
  Administered 2013-09-06: 3.375 g via INTRAVENOUS
  Filled 2013-09-06: qty 50

## 2013-09-06 MED ORDER — ENOXAPARIN SODIUM 30 MG/0.3ML ~~LOC~~ SOLN: 30.0000 mg | SUBCUTANEOUS | Status: DC

## 2013-09-06 MED ORDER — SODIUM CHLORIDE 0.9 % IV SOLN
INTRAVENOUS | Status: DC
  Administered 2013-09-06: 1000 mL via INTRAVENOUS
  Administered 2013-09-10 – 2013-09-11 (×3): via INTRAVENOUS

## 2013-09-06 MED ORDER — VANCOMYCIN HCL IN DEXTROSE 750-5 MG/150ML-% IV SOLN
750.0000 mg | INTRAVENOUS | Status: DC
  Administered 2013-09-07 – 2013-09-08 (×2): 750 mg via INTRAVENOUS
  Filled 2013-09-06 (×3): qty 150

## 2013-09-06 MED ORDER — ASPIRIN 325 MG PO TABS
325.0000 mg | ORAL_TABLET | Freq: Every day | ORAL | Status: DC
  Administered 2013-09-09 – 2013-09-11 (×3): 325 mg via ORAL
  Filled 2013-09-06 (×3): qty 1

## 2013-09-06 MED ORDER — SODIUM CHLORIDE 0.9 % IV BOLUS (SEPSIS)
500.0000 mL | Freq: Once | INTRAVENOUS | Status: AC
  Administered 2013-09-06: 500 mL via INTRAVENOUS

## 2013-09-06 MED ORDER — LORAZEPAM 2 MG/ML IJ SOLN
1.0000 mg | Freq: Once | INTRAMUSCULAR | Status: AC
  Administered 2013-09-06: 1 mg via INTRAVENOUS
  Filled 2013-09-06: qty 1

## 2013-09-06 MED ORDER — LORAZEPAM 2 MG/ML IJ SOLN
1.0000 mg | INTRAMUSCULAR | Status: DC | PRN
  Administered 2013-09-06 – 2013-09-11 (×7): 1 mg via INTRAVENOUS
  Filled 2013-09-06 (×7): qty 1

## 2013-09-06 MED ORDER — RAMIPRIL 2.5 MG PO CAPS
5.0000 mg | ORAL_CAPSULE | Freq: Every day | ORAL | Status: DC
  Administered 2013-09-11: 5 mg via ORAL
  Filled 2013-09-06: qty 1
  Filled 2013-09-06: qty 2
  Filled 2013-09-06 (×2): qty 1
  Filled 2013-09-06 (×3): qty 2
  Filled 2013-09-06 (×2): qty 1

## 2013-09-06 MED ORDER — BOOST / RESOURCE BREEZE PO LIQD
1.0000 | Freq: Three times a day (TID) | ORAL | Status: DC
  Administered 2013-09-09 – 2013-09-11 (×7): 1 via ORAL
  Filled 2013-09-06: qty 1

## 2013-09-06 MED ORDER — PIPERACILLIN-TAZOBACTAM 3.375 G IVPB
3.3750 g | Freq: Three times a day (TID) | INTRAVENOUS | Status: DC
  Administered 2013-09-07 – 2013-09-09 (×7): 3.375 g via INTRAVENOUS
  Filled 2013-09-06 (×8): qty 50

## 2013-09-06 MED ORDER — DIVALPROEX SODIUM 250 MG PO DR TAB
250.0000 mg | DELAYED_RELEASE_TABLET | Freq: Three times a day (TID) | ORAL | Status: DC
  Administered 2013-09-09 – 2013-09-11 (×8): 250 mg via ORAL
  Filled 2013-09-06 (×10): qty 1

## 2013-09-06 MED ORDER — LABETALOL HCL 200 MG PO TABS
200.0000 mg | ORAL_TABLET | Freq: Two times a day (BID) | ORAL | Status: DC
  Administered 2013-09-09 – 2013-09-11 (×3): 200 mg via ORAL
  Filled 2013-09-06 (×11): qty 1

## 2013-09-06 MED ORDER — CLONIDINE HCL 0.2 MG PO TABS
0.3000 mg | ORAL_TABLET | Freq: Every day | ORAL | Status: DC
  Administered 2013-09-09 – 2013-09-11 (×2): 0.3 mg via ORAL
  Filled 2013-09-06 (×8): qty 1

## 2013-09-06 MED ORDER — BENZTROPINE MESYLATE 1 MG PO TABS
1.0000 mg | ORAL_TABLET | Freq: Three times a day (TID) | ORAL | Status: DC
  Administered 2013-09-09 – 2013-09-11 (×8): 1 mg via ORAL
  Filled 2013-09-06 (×10): qty 1

## 2013-09-06 MED ORDER — VANCOMYCIN HCL IN DEXTROSE 1-5 GM/200ML-% IV SOLN
1000.0000 mg | Freq: Once | INTRAVENOUS | Status: AC
  Administered 2013-09-06: 1000 mg via INTRAVENOUS
  Filled 2013-09-06: qty 200

## 2013-09-06 MED ORDER — SIMVASTATIN 20 MG PO TABS
40.0000 mg | ORAL_TABLET | Freq: Every day | ORAL | Status: DC
  Administered 2013-09-09 – 2013-09-10 (×2): 40 mg via ORAL
  Filled 2013-09-06: qty 1
  Filled 2013-09-06 (×3): qty 2
  Filled 2013-09-06: qty 1

## 2013-09-06 NOTE — ED Notes (Addendum)
Pt from [facility]. Per EMS, facility reports that pt is not able to follow commands today and having abnormal behavior. Pt has large hematoma noted to right side of chest that extends around to right side of back. Pt also has small hematoma noted above right eye. Per EMS, CBG en route 132. Per Facility uncertain of time that change in status occurred or if pt fell.  Pt has slurred and garbled speech.

## 2013-09-06 NOTE — ED Notes (Signed)
Bed changed from ICU to 300 bed. Attempted report. Nurse to call me back.

## 2013-09-06 NOTE — ED Provider Notes (Signed)
CSN: [id number]     Arrival date & time 09/06/13  1237 History   First MD Initiated Contact with Patient 09/06/13 1301     Chief Complaint  Patient presents with  . Altered Mental Status   level V caveat due to altered mental status (Consider location/radiation/quality/duration/timing/severity/associated sxs/prior Treatment) Patient is a 58 y.o. male presenting with altered mental status. The history is provided by the patient, the nursing home and the EMS personnel.  Altered Mental Status  patient presents with altered mental status. Comes from nursing home. Has bruising to right chest and forehead. Unknown of time of bruising. Reportedly was hypotensive there. Unknown baseline. Patient will move all extremities. He is not able to give a history. Past Medical History  Diagnosis Date  . Aortic dissection   . Hypertension   . CVA (cerebral infarction)   . Aortic dissection   . Hypertension   . CVA (cerebral infarction)   . Diabetes mellitus   . Cardiomyopathy   . Hyperglycemia   . Bipolar 1 disorder   . Hyperkalemia   . Coronary artery disease   . Hyponatremia   . Altered mental status     with psychosis  . Dehydration   . TIA (transient ischemic attack)   . Lack of coordination   . Cardiomyopathy    Past Surgical History  Procedure Laterality Date  . Ascending aortic aneurysm repair     Family History  Problem Relation Age of Onset  . Alzheimer's disease Mother   . Stroke Father   . Heart disease Father   . Bipolar disorder Father   . Bipolar disorder Sister   . Bipolar disorder Sister    History  Substance Use Topics  . Smoking status: Never Smoker   . Smokeless tobacco: Not on file  . Alcohol Use: No    Review of Systems  Unable to perform ROS: Mental status change    Allergies  Review of patient's allergies indicates no known allergies.  Home Medications   No current outpatient prescriptions on file. BP 119/76  Pulse 80  Temp(Src) 97.5 F (36.4 C)  (Oral)  Resp 18  SpO2 100% Physical Exam  Constitutional: He appears well-developed.  Dry mucous membranes  HENT:  Head: Normocephalic.  Ecchymosis to right forehead.  Eyes: Pupils are equal, round, and reactive to light.  Neck: Normal range of motion. Neck supple.  Cardiovascular: Normal rate and regular rhythm.   Pulmonary/Chest: Effort normal and breath sounds normal.  Abdominal: There is no tenderness.  Musculoskeletal: Normal range of motion.  Neurological:  When asked what happened patient said it broke. He cannot give more history. He'll not follow commands. He keeps turning his head to the left than the right. He is breathing spontaneously.  Skin: Skin is warm.    ED Course  Procedures (including critical care time) Labs Review Labs Reviewed  CBC WITH DIFFERENTIAL - Abnormal; Notable for the following:    RBC 3.96 (*)    Hemoglobin 12.2 (*)    HCT 36.2 (*)    Platelets 132 (*)    All other components within normal limits  COMPREHENSIVE METABOLIC PANEL - Abnormal; Notable for the following:    Glucose, Bld 134 (*)    BUN 76 (*)    Creatinine, Ser 2.63 (*)    Albumin 3.0 (*)    AST 58 (*)    GFR calc non Af Amer 26 (*)    GFR calc Af Amer 30 (*)    All  other components within normal limits  VALPROIC ACID LEVEL - Abnormal; Notable for the following:    Valproic Acid Lvl 14.8 (*)    All other components within normal limits  URINALYSIS, ROUTINE W REFLEX MICROSCOPIC - Abnormal; Notable for the following:    APPearance TURBID (*)    Hgb urine dipstick MODERATE (*)    Protein, ur 30 (*)    Nitrite POSITIVE (*)    Leukocytes, UA LARGE (*)    All other components within normal limits  LACTIC ACID, PLASMA - Abnormal; Notable for the following:    Lactic Acid, Venous 2.4 (*)    All other components within normal limits  URINE MICROSCOPIC-ADD ON - Abnormal; Notable for the following:    Bacteria, UA MANY (*)    All other components within normal limits   SEDIMENTATION RATE - Abnormal; Notable for the following:    Sed Rate 62 (*)    All other components within normal limits  BLOOD GAS, ARTERIAL - Abnormal; Notable for the following:    pCO2 arterial 32.0 (*)    pO2, Arterial 102.0 (*)    Acid-base deficit 3.8 (*)    All other components within normal limits  AMMONIA - Abnormal; Notable for the following:    Ammonia 10 (*)    All other components within normal limits  URINE CULTURE  CULTURE, BLOOD (ROUTINE X 2)  CULTURE, BLOOD (ROUTINE X 2)  TROPONIN I  TSH  RPR  VITAMIN B12  TSH  FOLATE  BASIC METABOLIC PANEL  HEPATIC FUNCTION PANEL  URINALYSIS, ROUTINE W REFLEX MICROSCOPIC  LEVETIRACETAM LEVEL   Imaging Review Dg Chest 1 View  09/06/2013   CLINICAL DATA:  Altered mental status.  EXAM: CHEST - 1 VIEW  COMPARISON:  08/19/2013.  FINDINGS: Lung volumes are normal. No consolidative airspace disease. No pleural effusions. No pneumothorax. No pulmonary nodule or mass noted. Pulmonary vasculature and the cardiomediastinal silhouette are within normal limits. Atherosclerosis in the thoracic aorta. Status post CABG.  IMPRESSION: No radiographic evidence of acute cardiopulmonary disease.   Electronically Signed   By: Vinnie Langton M.D.   On: 09/06/2013 15:01   Ct Head Wo Contrast  09/06/2013   CLINICAL DATA:  Altered mental status, fall, patient is unresponsive  EXAM: CT HEAD WITHOUT CONTRAST  TECHNIQUE: Contiguous axial images were obtained from the base of the skull through the vertex without intravenous contrast.  COMPARISON:  06/14/2013  FINDINGS: Extensive debris within the nasal passages bilaterally. Opacification of the right frontal sinuses an inferior anterior right ethmoid air cells. Sphenoid and maxillary sinuses appear clear. Mastoid air cells are clear. No evidence of fracture involving the skull.  There is moderate diffuse atrophy. There is focal low attenuation in the right occipital lobe. This is stable when compared to the  prior study and is consistent with encephalomalacia related to prior infarct. There is mild scattered white matter disease in the periventricular white matter bilaterally which is stable. There is evidence of prior left basal ganglia lacunar infarcts and possibly tiny lacunar infarct on the right just above the basal ganglia. These findings are unchanged. There is no hemorrhage or extra-axial fluid. There is no evidence of acute vascular territory infarct.  Low attenuation in the anterior inferior right cerebellum suggests prior infarct. This is also stable.  IMPRESSION: Multiple areas of prior infarction unchanged.  Evidence of sinusitis involving right frontal and ethmoid sinuses.   Electronically Signed   By: Skipper Cliche M.D.   On: 09/06/2013 14:57  EKG Interpretation    Date/Time:  Saturday September 06 2013 12:42:58 EST Ventricular Rate:  79 PR Interval:  178 QRS Duration: 92 QT Interval:  386 QTC Calculation: 442 R Axis:   -46 Text Interpretation:  Normal sinus rhythm Left anterior fascicular block Nonspecific T wave abnormality Abnormal ECG When compared with ECG of 14-Jun-2013 20:39, No significant change was found Confirmed by Foster Sonnier  MD, Bristol Soy ([id number]) on 09/06/2013 1:56:30 PM            MDM   1. UTI (urinary tract infection)   2. Encephalopathy   3. Dehydration    Patient was sent from the nursing home with altered mental status. Found to be dehydrated with urinary tract infection. Encephalopathy likely from the UTI. Patient given IV fluids and antibiotics. Admitted to internal medicine.    Jasper Riling. Alvino Chapel, MD 09/06/13 2126

## 2013-09-06 NOTE — H&P (Signed)
[id number] 

## 2013-09-06 NOTE — ED Notes (Signed)
Old bruising, brown/organge in color to right rib area just under right breast. Staff is unsure what happened to cause the bruise

## 2013-09-06 NOTE — H&P (Signed)
NAME:  Derrick Butler, FARE NO.:  [id number]  MEDICAL RECORD NO.:  [id number]  LOCATION:  [facility]                         FACILITY:  [facility]  PHYSICIAN:  Unk Lightning, MDDATE OF BIRTH:  Sep 26, 1955  DATE OF ADMISSION:  09/06/2013 DATE OF DISCHARGE:  [facility]                             HISTORY & PHYSICAL   HISTORY OF PRESENT ILLNESS:  The patient is a 58 year old white male with altered mental status, status post multiinfarct dementia and a resident of nursing home, was sent over here because of diminished cognitive functioning.  According to the nurse's notes, I have no chart available.  The patient was seen and worked up in the [place].  He had a CT scan revealing multiple old infarcts with nothing new on initial CT, who responds only to tactile and painful stimuli.  His plantars are upgoing bilaterally, and he likewise has a UTI and consideration is given to septicemia and possible summation of sedating drugs, most of which will be held and blood levels of divalproex obtained.  The patient will be admitted with blood cultures performed and treated empirically with vanc and Zosyn.  His lactic acid is minimally elevated.  However, he is not hypotensive at present.  He is not able to give a history at present.  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  Intravascular volume depletion, history of hypertension, and history of CVA.  PAST SURGICAL HISTORY:  Unknown.  ALLERGIES:  He has no known allergies.  CURRENT MEDICINES:  Cogentin 1 mg p.o. t.i.d., clonidine 0.3 mg p.o. daily, Depakote 250 p.o. t.i.d., labetalol 200 mg p.o. b.i.d., Altace 10 mg p.o. daily, and Zocor 40 mg p.o. daily.  He likewise has a creatinine of 2.63 and his Altace will be diminished from  10-5.  PHYSICAL EXAMINATION:  VITAL SIGNS:  Blood pressure 117/68, temperature 98.5, pulse 85 and regular, respiratory rate is 25, O2 sat is 100% on room air. HEENT:  Eyes; PERRLA.  Extraocular movement intact.  Sclerae clear. Conjunctivae pink. NECK:  No JVD.  No carotid bruits.  No thyromegaly.  No thyroid bruits. LUNGS:  Clear to A and P with diminished breath sounds at the bases. The patient has what appears to be a burn on his right posterior lateral and anterior thorax of approximately 2-3 inches wide and perhaps 14-18 inches long.  There is no blistering.  It is sharply delineated, so I doubt it is hematoma.  No tenderness over this region. HEART:  Regular rhythm.  No S3, S4.  No heaves, thrills, or rubs. ABDOMEN:  Soft, nontender.  Bowel sounds normoactive.  No guarding, rebound, mass, or megaly. NEUROLOGIC:  The patient response to tactile and painful stimuli. Plantars are upgoing bilaterally.  The patient moves all 4 extremities in response to pain.  IMPRESSION: 1. Diminished and altered mental status, diminished awareness,     possible cerebrovascular accident pending. 2. Urinary tract infection. 3. Possible septicemia. 4. Possible multiple drug effect, which are sedating. 5. Hypertension. 6. Hyperlipidemia.  PLAN:  Right now is to  hold sedating drugs.  We will do blood cultures, urinalysis.  He is treated empirically with vanc and Zosyn.  His lactic acid is minimally elevated 2.7.  We will check Depakote level, placed empirically on Lovenox, full dose aspirin, and monitor blood glucose and make further recommendations as the database expands.     Unk Lightning, MD     RMD/MEDQ  D:  09/06/2013  T:  09/06/2013  Job:  [id number]

## 2013-09-06 NOTE — ED Notes (Signed)
Phoned Dr. Luan Pulling to determine if he wanted pt to go to ICU bed per [facility] request. Dr. Luan Pulling to call back.

## 2013-09-07 ENCOUNTER — Inpatient Hospital Stay (HOSPITAL_COMMUNITY): Payer: Medicare Other

## 2013-09-07 LAB — HEPATIC FUNCTION PANEL
ALBUMIN: 2.6 g/dL — AB (ref 3.5–5.2)
ALK PHOS: 58 U/L (ref 39–117)
ALT: 24 U/L (ref 0–53)
AST: 43 U/L — ABNORMAL HIGH (ref 0–37)
BILIRUBIN DIRECT: 0.2 mg/dL (ref 0.0–0.3)
BILIRUBIN TOTAL: 0.5 mg/dL (ref 0.3–1.2)
Indirect Bilirubin: 0.3 mg/dL (ref 0.3–0.9)
Total Protein: 5.8 g/dL — ABNORMAL LOW (ref 6.0–8.3)

## 2013-09-07 LAB — BASIC METABOLIC PANEL
BUN: 56 mg/dL — ABNORMAL HIGH (ref 6–23)
CALCIUM: 8.8 mg/dL (ref 8.4–10.5)
CO2: 23 meq/L (ref 19–32)
Chloride: 109 mEq/L (ref 96–112)
Creatinine, Ser: 1.94 mg/dL — ABNORMAL HIGH (ref 0.50–1.35)
GFR calc non Af Amer: 37 mL/min — ABNORMAL LOW (ref >90)
GFR, EST AFRICAN AMERICAN: 43 mL/min — AB (ref >90)
Glucose, Bld: 91 mg/dL (ref 70–99)
Potassium: 4.2 mEq/L (ref 3.7–5.3)
SODIUM: 145 meq/L (ref 137–147)

## 2013-09-07 LAB — GLUCOSE, CAPILLARY
GLUCOSE-CAPILLARY: 86 mg/dL (ref 70–99)
GLUCOSE-CAPILLARY: 77 mg/dL (ref 70–99)
Glucose-Capillary: 98 mg/dL (ref 70–99)
Glucose-Capillary: 89 mg/dL (ref 70–99)
Glucose-Capillary: 83 mg/dL (ref 70–99)

## 2013-09-07 LAB — FOLATE: FOLATE: 6.6 ng/mL

## 2013-09-07 LAB — VITAMIN B12: VITAMIN B 12: 596 pg/mL (ref 211–911)

## 2013-09-07 LAB — RPR: RPR: NONREACTIVE

## 2013-09-07 LAB — TSH
TSH: 0.96 u[IU]/mL (ref 0.350–4.500)
TSH: 0.883 u[IU]/mL (ref 0.350–4.500)

## 2013-09-07 MED ORDER — ENOXAPARIN SODIUM 40 MG/0.4ML ~~LOC~~ SOLN
40.0000 mg | SUBCUTANEOUS | Status: DC
  Administered 2013-09-07 – 2013-09-11 (×5): 40 mg via SUBCUTANEOUS
  Filled 2013-09-07 (×5): qty 0.4

## 2013-09-07 MED ORDER — PIPERACILLIN-TAZOBACTAM 3.375 G IVPB
INTRAVENOUS | Status: AC
  Filled 2013-09-07: qty 50

## 2013-09-07 NOTE — Progress Notes (Signed)
ANTIBIOTIC CONSULT NOTE - INITIAL  Pharmacy Consult for Vancomycin and Zosyn Indication: sepsis and UTI  No Known Allergies  Patient Measurements: Height: 6' (182.9 cm) Weight: 125 lb 3.5 oz (56.8 kg) IBW/kg (Calculated) : 77.6  Vital Signs: Temp: 97.4 F (36.3 C) (01/04 0700) Temp src: Axillary (01/04 0700) BP: 120/70 mmHg (01/04 0700) Intake/Output from previous day: 01/03 0701 - 01/04 0700 In: -  Out: 700 [Urine:700] Intake/Output from this shift:    Labs:  Recent Labs  09/06/13 1344 09/07/13 0614  WBC 6.8  --   HGB 12.2*  --   PLT 132*  --   CREATININE 2.63* 1.94*   Estimated Creatinine Clearance: 34.2 ml/min (by C-G formula based on Cr of 1.94). No results found for this basename: VANCOTROUGH, VANCOPEAK, VANCORANDOM, [place], GENTPEAK, GENTRANDOM, TOBRATROUGH, TOBRAPEAK, TOBRARND, AMIKACINPEAK, AMIKACINTROU, AMIKACIN,  in the last 72 hours   Microbiology: Recent Results (from the past 720 hour(s))  CULTURE, BLOOD (ROUTINE X 2)     Status: None   Collection Time    09/06/13  2:06 PM      Result Value Range Status   Specimen Description RIGHT ANTECUBITAL   Final   Special Requests BOTTLES DRAWN AEROBIC ONLY 6CC   Final   Culture NO GROWTH 1 DAY   Final   Report Status PENDING   Incomplete  CULTURE, BLOOD (ROUTINE X 2)     Status: None   Collection Time    09/06/13  5:07 PM      Result Value Range Status   Specimen Description RIGHT ANTECUBITAL   Final   Special Requests BOTTLES DRAWN AEROBIC ONLY House   Final   Culture NO GROWTH 1 DAY   Final   Report Status PENDING   Incomplete   Medical History: Past Medical History  Diagnosis Date  . Aortic dissection   . Hypertension   . CVA (cerebral infarction)   . Aortic dissection   . Hypertension   . CVA (cerebral infarction)   . Diabetes mellitus   . Cardiomyopathy   . Hyperglycemia   . Bipolar 1 disorder   . Hyperkalemia   . Coronary artery disease   . Hyponatremia   . Altered mental status    with psychosis  . Dehydration   . TIA (transient ischemic attack)   . Lack of coordination   . Cardiomyopathy    Medications:  Scheduled:  . aspirin  325 mg Oral Daily  . benztropine  1 mg Oral TID  . cloNIDine  0.3 mg Oral Daily  . divalproex  250 mg Oral TID  . enoxaparin (LOVENOX) injection  30 mg Subcutaneous Q24H  . feeding supplement (RESOURCE BREEZE)  1 Container Oral TID BM  . labetalol  200 mg Oral BID  . piperacillin-tazobactam (ZOSYN)  IV  3.375 g Intravenous Q8H  . ramipril  5 mg Oral Daily  . simvastatin  40 mg Oral QHS  . vancomycin  750 mg Intravenous Q24H   Assessment: 58yo male admitted with AMS and suspected UTI and sepsis.  SCr is elevated on admission.  Estimated Creatinine Clearance: 34.2 ml/min (by C-G formula based on Cr of 1.94).  Goal of Therapy:  Vancomycin trough level 15-20 Eradicate infection.  Plan:  Vancomycin 1000mg  IV initially x 1 (loading dose) then Vancomycin 750mg  IV q24hrs Check trough at steady state Zosyn 3.375gm IV q8h, each dose over 4 hrs Monitor labs, renal fxn, and cultures  Hart Robinsons A 09/07/2013,11:26 AM

## 2013-09-07 NOTE — Progress Notes (Signed)
Patient still resting heavily.  PO meds held at this time d/t lethargy.  Have not given any more PRN ativan at this time.  Will attempt to give meds if patient awakens enough through the afternoon.

## 2013-09-07 NOTE — Progress Notes (Signed)
NAME:  Derrick Butler, Derrick Butler              ACCOUNT NO.:  [id number]  MEDICAL RECORD NO.:  [id number]  LOCATION:  [facility]                         FACILITY:  [facility]  PHYSICIAN:  Bilaal Leib L. Luan Pulling, M.D.DATE OF BIRTH:  Apr 02, 1956  DATE OF PROCEDURE: DATE OF DISCHARGE:                                PROGRESS NOTE   Mr. Derrick Butler was admitted with altered mental status.  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  He is more confused now however.  He appears to have a urinary tract infection that may have precipitated this.  He was quite agitated at the time of admission.  He is less agitated now.  He has not been able to take some of his oral medications, but at this point, his blood pressure is okay.  PHYSICAL EXAMINATION:  VITAL SIGNS:  His temperature is 97.4, respirations 20, pulse 85, blood pressure 120/70. GENERAL:  He is sleepy, but arousable.  He seems more confused than usual. NECK:  Supple. CHEST:  Pretty clear. HEART:  Regular.  His renal function is better this morning.  I have reviewed his medications.  Assessment then he has altered mental status which I think is probably a combination of things including urinary tract infection.  He has acute on chronic renal insufficiency, partially from dehydration.  He seems to be doing better.  He has had multiple infarctions which has caused his baseline mental status abnormality.  He has severe hypertension, but right now it is pretty well controlled.  Plan is to continue with treatments, continue IV fluids, etc.     Shereen Marton L. Luan Pulling, M.D.     ELH/MEDQ  D:  09/07/2013  T:  09/07/2013  Job:  [id number]

## 2013-09-08 LAB — GLUCOSE, CAPILLARY
GLUCOSE-CAPILLARY: 77 mg/dL (ref 70–99)
GLUCOSE-CAPILLARY: 82 mg/dL (ref 70–99)
Glucose-Capillary: 103 mg/dL — ABNORMAL HIGH (ref 70–99)
Glucose-Capillary: 67 mg/dL — ABNORMAL LOW (ref 70–99)
Glucose-Capillary: 68 mg/dL — ABNORMAL LOW (ref 70–99)
Glucose-Capillary: 71 mg/dL (ref 70–99)

## 2013-09-08 LAB — BASIC METABOLIC PANEL
BUN: 41 mg/dL — ABNORMAL HIGH (ref 6–23)
CO2: 24 mEq/L (ref 19–32)
CREATININE: 1.63 mg/dL — AB (ref 0.50–1.35)
Calcium: 8.7 mg/dL (ref 8.4–10.5)
Chloride: 111 mEq/L (ref 96–112)
GFR, EST AFRICAN AMERICAN: 53 mL/min — AB (ref >90)
GFR, EST NON AFRICAN AMERICAN: 46 mL/min — AB (ref >90)
GLUCOSE: 102 mg/dL — AB (ref 70–99)
Potassium: 4.1 mEq/L (ref 3.7–5.3)
Sodium: 145 mEq/L (ref 137–147)

## 2013-09-08 LAB — HEPATIC FUNCTION PANEL
ALT: 21 U/L (ref 0–53)
AST: 27 U/L (ref 0–37)
Albumin: 2.5 g/dL — ABNORMAL LOW (ref 3.5–5.2)
Alkaline Phosphatase: 53 U/L (ref 39–117)
Bilirubin, Direct: <0.2 mg/dL (ref 0.0–0.3)
Total Bilirubin: 0.6 mg/dL (ref 0.3–1.2)
Total Protein: 5.6 g/dL — ABNORMAL LOW (ref 6.0–8.3)

## 2013-09-08 LAB — URINE CULTURE: Colony Count: >=100000

## 2013-09-08 MED ORDER — PRO-STAT SUGAR FREE PO LIQD
30.0000 mL | Freq: Three times a day (TID) | ORAL | Status: DC
  Administered 2013-09-09 – 2013-09-11 (×9): 30 mL via ORAL
  Filled 2013-09-08 (×9): qty 30

## 2013-09-08 MED ORDER — DEXTROSE 50 % IV SOLN
25.0000 mL | Freq: Once | INTRAVENOUS | Status: AC | PRN
  Administered 2013-09-08: 25 mL via INTRAVENOUS
  Filled 2013-09-08: qty 50

## 2013-09-08 MED ORDER — ADULT MULTIVITAMIN W/MINERALS CH
1.0000 | ORAL_TABLET | Freq: Every day | ORAL | Status: DC
  Administered 2013-09-10 – 2013-09-11 (×2): 1 via ORAL
  Filled 2013-09-08 (×3): qty 1

## 2013-09-08 NOTE — Clinical Social Work Psychosocial (Signed)
Clinical Social Work Department BRIEF PSYCHOSOCIAL ASSESSMENT 09/08/2013  Patient:  Derrick Butler, Derrick Butler     Account Number:  [id number]     Admit date:  09/06/2013  Clinical Social Worker:  Wyatt Haste  Date/Time:  09/08/2013 01:10 PM  Referred by:  CSW  Date Referred:  09/08/2013 Referred for  SNF Placement   Other Referral:   Interview type:  Other - See comment Other interview type:   POA- Glenda    PSYCHOSOCIAL DATA Living Status:  FACILITY Admitted from facility:  [facility] Level of care:  [place] Primary support name:  Glenda Primary support relationship to patient:  FRIEND Degree of support available:   supportive    CURRENT CONCERNS Current Concerns  Post-Acute Placement   Other Concerns:    SOCIAL WORK ASSESSMENT / PLAN CSW attempted to meet with pt, but pt is very disoriented. Spoke with Holley Raring who is listed as contact. Holley Raring reports she is just a friend, but is POA. Pt has no active family. He has a sister who resides at [facility] and another sister who lives locally but is not involved. Holley Raring reports she has been looking after pt for about 20 years. He was placed at [facility] in October 2014. Plan is for long term. Holley Raring indicates things have been going well at [facility] and she requests return. Per Jackelyn Poling at facility, pt is close to a total assist. He is able to transfer to a wheelchair with assist. Okay for return.   Assessment/plan status:  Psychosocial Support/Ongoing Assessment of Needs Other assessment/ plan:   Information/referral to community resources:   [facility]    PATIENT'S/FAMILY'S RESPONSE TO PLAN OF CARE: Pt unable to discuss plan of care at this time. Glenda reports positive feelings regarding return to [facility] when medically stable. CSW will continue to follow.       Benay Pike, [place]

## 2013-09-08 NOTE — Progress Notes (Signed)
ANTIBIOTIC CONSULT NOTE -  Pharmacy Consult for Vancomycin and Zosyn Indication: sepsis and UTI  No Known Allergies  Patient Measurements: Height: 6' (182.9 cm) Weight: 125 lb 3.5 oz (56.8 kg) IBW/kg (Calculated) : 77.6  Vital Signs: Temp: 98 F (36.7 C) (01/05 0626) BP: 119/54 mmHg (01/05 0626) Pulse Rate: 58 (01/05 0626) Intake/Output from previous day: 01/04 0701 - 01/05 0700 In: 800 [I.V.:800] Out: 600 [Urine:600] Intake/Output from this shift:    Labs:  Recent Labs  09/06/13 1344 09/07/13 0614 09/08/13 0539  WBC 6.8  --   --   HGB 12.2*  --   --   PLT 132*  --   --   CREATININE 2.63* 1.94* 1.63*   Estimated Creatinine Clearance: 40.7 ml/min (by C-G formula based on Cr of 1.63). No results found for this basename: VANCOTROUGH, Corlis Leak, VANCORANDOM, GENTTROUGH, GENTPEAK, GENTRANDOM, TOBRATROUGH, TOBRAPEAK, TOBRARND, AMIKACINPEAK, AMIKACINTROU, AMIKACIN,  in the last 72 hours   Microbiology: Recent Results (from the past 720 hour(s))  CULTURE, BLOOD (ROUTINE X 2)     Status: None   Collection Time    09/06/13  2:06 PM      Result Value Range Status   Specimen Description RIGHT ANTECUBITAL   Final   Special Requests BOTTLES DRAWN AEROBIC ONLY 6CC   Final   Culture NO GROWTH 2 DAYS   Final   Report Status PENDING   Incomplete  URINE CULTURE     Status: None   Collection Time    09/06/13  3:40 PM      Result Value Range Status   Specimen Description URINE, CATHETERIZED   Final   Special Requests NONE   Final   Culture  Setup Time     Final   Value: 09/06/2013 21:00     Performed at [facility]     Final   Value: >=100,000 COLONIES/ML     Performed at [facility]   Culture     Final   Value: STAPHYLOCOCCUS AUREUS     Note: RIFAMPIN AND GENTAMICIN SHOULD NOT BE USED AS SINGLE DRUGS FOR TREATMENT OF STAPH INFECTIONS.     Performed at [facility]   Report Status PENDING   Incomplete  CULTURE, BLOOD (ROUTINE X 2)      Status: None   Collection Time    09/06/13  5:07 PM      Result Value Range Status   Specimen Description RIGHT ANTECUBITAL   Final   Special Requests BOTTLES DRAWN AEROBIC ONLY 6CC   Final   Culture NO GROWTH 2 DAYS   Final   Report Status PENDING   Incomplete   Medical History: Past Medical History  Diagnosis Date  . Aortic dissection   . Hypertension   . CVA (cerebral infarction)   . Aortic dissection   . Hypertension   . CVA (cerebral infarction)   . Diabetes mellitus   . Cardiomyopathy   . Hyperglycemia   . Bipolar 1 disorder   . Hyperkalemia   . Coronary artery disease   . Hyponatremia   . Altered mental status     with psychosis  . Dehydration   . TIA (transient ischemic attack)   . Lack of coordination   . Cardiomyopathy    Medications:  Scheduled:  . aspirin  325 mg Oral Daily  . benztropine  1 mg Oral TID  . cloNIDine  0.3 mg Oral Daily  . divalproex  250 mg Oral TID  .  enoxaparin (LOVENOX) injection  40 mg Subcutaneous Q24H  . feeding supplement (RESOURCE BREEZE)  1 Container Oral TID BM  . labetalol  200 mg Oral BID  . piperacillin-tazobactam (ZOSYN)  IV  3.375 g Intravenous Q8H  . ramipril  5 mg Oral Daily  . simvastatin  40 mg Oral QHS  . vancomycin  750 mg Intravenous Q24H   Assessment: 58yo male admitted with AMS and suspected UTI and sepsis.  SCr elevated on admission but is gradually improving.  Estimated Creatinine Clearance: 40.7 ml/min (by C-G formula based on Cr of 1.63).  Goal of Therapy:  Vancomycin trough level 15-20 Eradicate infection.  Plan:  Vancomycin 1000mg  IV initially x 1 (loading dose) then Vancomycin 750mg  IV q24hrs Check trough at steady state Zosyn 3.375gm IV q8h, each dose over 4 hrs Monitor labs, renal fxn, and cultures  Hart Robinsons A 09/08/2013,10:50 AM

## 2013-09-08 NOTE — Progress Notes (Signed)
Pt very difficult to awaken.  Pt would not open mouth to take in any fluid or meds.  Pt very agitated when attempting to give patient anything by mouth.  Will try again.

## 2013-09-08 NOTE — Progress Notes (Addendum)
INITIAL NUTRITION ASSESSMENT  DOCUMENTATION CODES Per approved criteria  -Severe malnutrition in the context of chronic illness -Underweight   INTERVENTION: When pt is appropriate for diet advancement: Resume -Resource Breeze po TID, each supplement provides 250 kcal and 9 grams of protein.  -ProStat 30 ml TID (each 30 ml provides 100 kcal, 15 gr protein) -MVI daily  NUTRITION DIAGNOSIS: Inadequate oral intake related to poor po intake ; ongoing as evidenced by dehydration, severe wt loss, altered mental status.   Goal: Pt to meet >/= 90% of their estimated nutrition needs   Monitor: po intake meals and supplements, labs  Reason for Assessment: Malnutrition Screen   58 y.o. male  Patient Active Problem List   Diagnosis Date Noted  . CVA, old, cognitive deficits 09/06/2013  . UTI (lower urinary tract infection) 09/06/2013  . Dehydration 06/19/2013  . H/O: stroke 06/18/2013  . Altered mental status 06/18/2013  . Bipolar I disorder, most recent episode mixed 06/02/2013  . CARDIOMYOPATHY OTHER DISEASES CLASSIFIED ELSW 02/21/2010  . HYPERTENSION, UNSPECIFIED 03/12/2009  . AORTIC DISSECTION 03/12/2009    ASSESSMENT: Pt assessed by RD 06/16/13-pt admitted for possible PNA, UTI and FTT. His wt 172#. During that admission noted behavorial disturbance, psychiatric illness, lethargy. He was assisted with feeding howbeit with pt reluctance (noted spitting food out and refusing to eat).  Today he is laying with his eyes closed and head moving side to side. Nursing obtained re-weight of pt to verify severe wt loss. He has unplanned wt loss of  43#,26% since ED visit on 08/18/13 due to shortness of breath. At that time a wt of 168# was recorded. Pt has muscle wasting quadriceps, calves. Nutrition exam limited due to pt inability to participate.   Pt meets criteria for severe malnutrition in the context of chronic illness given his severe wt loss >5% in one month, hx of poor po intake, and  muscle wasting.  Height: Ht Readings from Last 1 Encounters:  09/07/13 6' (1.829 m)    Weight: Wt Readings from Last 1 Encounters:  09/07/13 125 lb 3.5 oz (56.8 kg)    Ideal Body Weight: 178# (80.9 kg)  % Ideal Body Weight: 70%  Wt Readings from Last 10 Encounters:  09/07/13 125 lb 3.5 oz (56.8 kg)  08/18/13 168 lb (76.204 kg)  06/17/13 168 lb 3.4 oz (76.3 kg)  06/02/13 172 lb (78.019 kg)    Usual Body Weight: 168-172#  % Usual Body Weight: 74%  BMI:  Body mass index is 16.98 kg/(m^2). underweight   Estimated Nutritional Needs: Kcal: 1700-1995 Protein: 80-91 gr Fluid: 2000 ml daily  Skin: excoriated marks to buttocks  Diet Order: NPO  EDUCATION NEEDS: -No education needs identified at this time   Intake/Output Summary (Last 24 hours) at 09/08/13 1045 Last data filed at 09/08/13 0606  Gross per 24 hour  Intake    800 ml  Output    600 ml  Net    200 ml    Last BM: 09/06/13  Labs:   Recent Labs Lab 09/06/13 1344 09/07/13 0614 09/08/13 0539  NA 143 145 145  K 4.9 4.2 4.1  CL 104 109 111  CO2 24 23 24   BUN 76* 56* 41*  CREATININE 2.63* 1.94* 1.63*  CALCIUM 9.9 8.8 8.7  GLUCOSE 134* 91 102*    CBG (last 3)   Recent Labs  09/08/13 0034 09/08/13 0356 09/08/13 0746  GLUCAP 68* 103* 77    Scheduled Meds: . aspirin  325 mg Oral  Daily  . benztropine  1 mg Oral TID  . cloNIDine  0.3 mg Oral Daily  . divalproex  250 mg Oral TID  . enoxaparin (LOVENOX) injection  40 mg Subcutaneous Q24H  . feeding supplement (RESOURCE BREEZE)  1 Container Oral TID BM  . labetalol  200 mg Oral BID  . piperacillin-tazobactam (ZOSYN)  IV  3.375 g Intravenous Q8H  . ramipril  5 mg Oral Daily  . simvastatin  40 mg Oral QHS  . vancomycin  750 mg Intravenous Q24H    Continuous Infusions: . sodium chloride 1,000 mL (09/06/13 1905)    Past Medical History  Diagnosis Date  . Aortic dissection   . Hypertension   . CVA (cerebral infarction)   . Aortic  dissection   . Hypertension   . CVA (cerebral infarction)   . Diabetes mellitus   . Cardiomyopathy   . Hyperglycemia   . Bipolar 1 disorder   . Hyperkalemia   . Coronary artery disease   . Hyponatremia   . Altered mental status     with psychosis  . Dehydration   . TIA (transient ischemic attack)   . Lack of coordination   . Cardiomyopathy     Past Surgical History  Procedure Laterality Date  . Ascending aortic aneurysm repair      Colman Cater MS,RD,CSG,LDN Office: [phone] Pager: [phone]

## 2013-09-08 NOTE — Progress Notes (Signed)
Pharmacy Consult: Lovenox for VTE prophylaxis.  Patient Measurements: Height: 6' (182.9 cm) Weight: 125 lb 3.5 oz (56.8 kg) IBW/kg (Calculated) : 77.6 Body mass index is 16.98 kg/(m^2).  VITALS Filed Vitals:   09/08/13 0626  BP: 119/54  Pulse: 58  Temp: 98 F (36.7 C)  Resp: 18   CBC:    Component Value Date/Time   WBC 6.8 09/06/2013 1344   RBC 3.96* 09/06/2013 1344   RBC 4.25 12/12/2009 0956   HGB 12.2* 09/06/2013 1344   HCT 36.2* 09/06/2013 1344   PLT 132* 09/06/2013 1344   MCV 91.4 09/06/2013 1344   MCH 30.8 09/06/2013 1344   MCHC 33.7 09/06/2013 1344   RDW 13.4 09/06/2013 1344   LYMPHSABS 1.2 09/06/2013 1344   MONOABS 0.6 09/06/2013 1344   EOSABS 0.1 09/06/2013 1344   BASOSABS 0.0 09/06/2013 1344   RENAL FUNCTION: Estimated Creatinine Clearance: 40.7 ml/min (by C-G formula based on Cr of 1.63).  Assessment: Dose stable for age, weight, renal function and indication.  Plan: Lovenox 40mg  SQ q24hrs Pharmacy will sign off  Ena Dawley, [facility] 09/08/2013 10:48 AM

## 2013-09-08 NOTE — Progress Notes (Signed)
Subjective: He's a little more alert. He tried to get up unassisted and slipped in the bed last night. He has required restraints because he's become combative again  Objective: Vital signs in last 24 hours: Temp:  [97.4 F (36.3 C)-98 F (36.7 C)] 98 F (36.7 C) (01/05 0626) Pulse Rate:  [58-93] 58 (01/05 0626) Resp:  [18-20] 18 (01/05 0626) BP: (119-125)/(54-82) 119/54 mmHg (01/05 0626) SpO2:  [99 %-100 %] 100 % (01/05 0626) Weight:  [56.8 kg (125 lb 3.5 oz)] 56.8 kg (125 lb 3.5 oz) (01/04 0900) Weight change:  Last BM Date: 09/06/13  Intake/Output from previous day: 01/04 0701 - 01/05 0700 In: 800 [I.V.:800] Out: 600 [Urine:600]  PHYSICAL EXAM General appearance: alert, cooperative and no distress Resp: clear to auscultation bilaterally Cardio: regular rate and rhythm, S1, S2 normal, no murmur, click, rub or gallop GI: soft, non-tender; bowel sounds normal; no masses,  no organomegaly Extremities: extremities normal, atraumatic, no cyanosis or edema  Lab Results:    Basic Metabolic Panel:  Recent Labs  09/07/13 0614 09/08/13 0539  NA 145 145  K 4.2 4.1  CL 109 111  CO2 23 24  GLUCOSE 91 102*  BUN 56* 41*  CREATININE 1.94* 1.63*  CALCIUM 8.8 8.7   Liver Function Tests:  Recent Labs  09/07/13 0614 09/08/13 0539  AST 43* 27  ALT 24 21  ALKPHOS 58 53  BILITOT 0.5 0.6  PROT 5.8* 5.6*  ALBUMIN 2.6* 2.5*   No results found for this basename: LIPASE, AMYLASE,  in the last 72 hours  Recent Labs  09/06/13 1717  AMMONIA 10*   CBC:  Recent Labs  09/06/13 1344  WBC 6.8  NEUTROABS 4.9  HGB 12.2*  HCT 36.2*  MCV 91.4  PLT 132*   Cardiac Enzymes:  Recent Labs  09/06/13 1344  TROPONINI <0.30   BNP: No results found for this basename: PROBNP,  in the last 72 hours D-Dimer: No results found for this basename: DDIMER,  in the last 72 hours CBG:  Recent Labs  09/07/13 1200 09/07/13 1701 09/07/13 2141 09/08/13 0034 09/08/13 0356  09/08/13 0746  GLUCAP 89 83 77 68* 103* 77   Hemoglobin A1C: No results found for this basename: HGBA1C,  in the last 72 hours Fasting Lipid Panel: No results found for this basename: CHOL, HDL, LDLCALC, TRIG, CHOLHDL, LDLDIRECT,  in the last 72 hours Thyroid Function Tests:  Recent Labs  09/07/13 0614  TSH 0.883   Anemia Panel:  Recent Labs  09/06/13 1717  VITAMINB12 596  FOLATE 6.6   Coagulation: No results found for this basename: LABPROT, INR,  in the last 72 hours Urine Drug Screen: Drugs of Abuse     Component Value Date/Time   LABOPIA NONE DETECTED 06/08/2013 0518   COCAINSCRNUR NONE DETECTED 06/08/2013 0518   LABBENZ NONE DETECTED 06/08/2013 0518   AMPHETMU NONE DETECTED 06/08/2013 0518   THCU NONE DETECTED 06/08/2013 0518   LABBARB NONE DETECTED 06/08/2013 0518    Alcohol Level: No results found for this basename: ETH,  in the last 72 hours Urinalysis:  Recent Labs  09/06/13 1540  COLORURINE YELLOW  LABSPEC 1.025  PHURINE 6.0  GLUCOSEU NEGATIVE  HGBUR MODERATE*  BILIRUBINUR NEGATIVE  KETONESUR NEGATIVE  PROTEINUR 30*  UROBILINOGEN 0.2  NITRITE POSITIVE*  [place]. Labs:  ABGS  Recent Labs  09/06/13 1731  PHART 7.414  PO2ART 102.0*  TCO2 18.3  HCO3 20.0   CULTURES Recent Results (from the  past 240 hour(s))  CULTURE, BLOOD (ROUTINE X 2)     Status: None   Collection Time    09/06/13  2:06 PM      Result Value Range Status   Specimen Description RIGHT ANTECUBITAL   Final   Special Requests BOTTLES DRAWN AEROBIC ONLY 6CC   Final   Culture NO GROWTH 1 DAY   Final   Report Status PENDING   Incomplete  URINE CULTURE     Status: None   Collection Time    09/06/13  3:40 PM      Result Value Range Status   Specimen Description URINE, CATHETERIZED   Final   Special Requests NONE   Final   Culture  Setup Time     Final   Value: 09/06/2013 21:00     Performed at [facility]     Final   Value:  >=100,000 COLONIES/ML     Performed at [facility]   Culture     Final   Value: STAPHYLOCOCCUS AUREUS     Note: RIFAMPIN AND GENTAMICIN SHOULD NOT BE USED AS SINGLE DRUGS FOR TREATMENT OF STAPH INFECTIONS.     Performed at [facility]   Report Status PENDING   Incomplete  CULTURE, BLOOD (ROUTINE X 2)     Status: None   Collection Time    09/06/13  5:07 PM      Result Value Range Status   Specimen Description RIGHT ANTECUBITAL   Final   Special Requests BOTTLES DRAWN AEROBIC ONLY 6CC   Final   Culture NO GROWTH 1 DAY   Final   Report Status PENDING   Incomplete   Studies/Results: Dg Skull 1-3 Views  09/07/2013   CLINICAL DATA:  Patient found on floor in room. Combative been only and cooperate. Head CT 09/06/2013  EXAM: SKULL - 1-3 VIEW  COMPARISON:  None.  FINDINGS: There is no evidence of skull fracture or other focal bone lesions.  IMPRESSION: Negative.   Electronically Signed   By: Curlene Dolphin M.D.   On: 09/07/2013 23:58   Dg Chest 1 View  09/06/2013   CLINICAL DATA:  Altered mental status.  EXAM: CHEST - 1 VIEW  COMPARISON:  08/19/2013.  FINDINGS: Lung volumes are normal. No consolidative airspace disease. No pleural effusions. No pneumothorax. No pulmonary nodule or mass noted. Pulmonary vasculature and the cardiomediastinal silhouette are within normal limits. Atherosclerosis in the thoracic aorta. Status post CABG.  IMPRESSION: No radiographic evidence of acute cardiopulmonary disease.   Electronically Signed   By: Vinnie Langton M.D.   On: 09/06/2013 15:01   Dg Thoracic Spine 2 View  09/07/2013   CLINICAL DATA:  Fall.  Combative.  EXAM: THORACIC SPINE - 2 VIEW  COMPARISON:  Chest x-ray 06/10/2013  FINDINGS: No acute thoracic spine fracture appreciated. No subluxation. Evaluation limited by a patient condition/motion. Mild height loss and anterior superior endplate spurring of 624THL or L1, stable from prior.  IMPRESSION: No evidence of acute thoracic spine injury.    Electronically Signed   By: Jorje Guild M.D.   On: 09/07/2013 23:58   Ct Head Wo Contrast  09/06/2013   CLINICAL DATA:  Altered mental status, fall, patient is unresponsive  EXAM: CT HEAD WITHOUT CONTRAST  TECHNIQUE: Contiguous axial images were obtained from the base of the skull through the vertex without intravenous contrast.  COMPARISON:  06/14/2013  FINDINGS: Extensive debris within the nasal passages bilaterally. Opacification of the right  frontal sinuses an inferior anterior right ethmoid air cells. Sphenoid and maxillary sinuses appear clear. Mastoid air cells are clear. No evidence of fracture involving the skull.  There is moderate diffuse atrophy. There is focal low attenuation in the right occipital lobe. This is stable when compared to the prior study and is consistent with encephalomalacia related to prior infarct. There is mild scattered white matter disease in the periventricular white matter bilaterally which is stable. There is evidence of prior left basal ganglia lacunar infarcts and possibly tiny lacunar infarct on the right just above the basal ganglia. These findings are unchanged. There is no hemorrhage or extra-axial fluid. There is no evidence of acute vascular territory infarct.  Low attenuation in the anterior inferior right cerebellum suggests prior infarct. This is also stable.  IMPRESSION: Multiple areas of prior infarction unchanged.  Evidence of sinusitis involving right frontal and ethmoid sinuses.   Electronically Signed   By: Skipper Cliche M.D.   On: 09/06/2013 14:57    Medications:  Prior to Admission:  Prescriptions prior to admission  Medication Sig Dispense Refill  . aspirin 325 MG tablet Take 325 mg by mouth daily.        . baclofen (LIORESAL) 10 MG tablet Take 10 mg by mouth 3 (three) times daily.      . benztropine (COGENTIN) 1 MG tablet Take 1 mg by mouth 3 (three) times daily.      . cloNIDine (CATAPRES) 0.3 MG tablet Take 0.3 mg by mouth daily.        .  divalproex (DEPAKOTE) 250 MG DR tablet Take 250 mg by mouth 3 (three) times daily.       . feeding supplement, RESOURCE BREEZE, (RESOURCE BREEZE) LIQD Take 1 Container by mouth 3 (three) times daily between meals.      . fluticasone (FLONASE) 50 MCG/ACT nasal spray Place 2 sprays into the nose daily.      [name] glyBURIDE (DIABETA) 5 MG tablet Take 5 mg by mouth daily with breakfast.        . labetalol (NORMODYNE) 200 MG tablet Take 200 mg by mouth 2 (two) times daily.        . potassium chloride (KLOR-CON) 20 MEQ packet Take 20 mEq by mouth daily.        . ramipril (ALTACE) 10 MG tablet Take 10 mg by mouth daily.        . risperiDONE (RISPERDAL) 1 MG tablet Take 1 tablet (1 mg total) by mouth 3 (three) times daily.  90 tablet  2  . simvastatin (ZOCOR) 40 MG tablet Take 40 mg by mouth at bedtime.        . temazepam (RESTORIL) 15 MG capsule Take 15 mg by mouth at bedtime.         Scheduled: . aspirin  325 mg Oral Daily  . benztropine  1 mg Oral TID  . cloNIDine  0.3 mg Oral Daily  . divalproex  250 mg Oral TID  . enoxaparin (LOVENOX) injection  40 mg Subcutaneous Q24H  . feeding supplement (RESOURCE BREEZE)  1 Container Oral TID BM  . labetalol  200 mg Oral BID  . piperacillin-tazobactam (ZOSYN)  IV  3.375 g Intravenous Q8H  . ramipril  5 mg Oral Daily  . simvastatin  40 mg Oral QHS  . vancomycin  750 mg Intravenous Q24H   Continuous: . sodium chloride 1,000 mL (09/06/13 1905)   [id number]  Assesment: On my physical examination I mistakenly put that he  was awake and alert and cooperative. He is still very sleepy but somewhat better. I think this is related to urinary tract infection. He has baseline CVA and cognitive defects from that. He has hypertension which is been difficult to control but seems to be doing okay so far. He's had previous strokes as mentioned. He had repair of an aortic aneurysm. He has coronary artery occlusive disease. This appears stable. He has acute on chronic renal  failure which is improving and I think that is related to dehydration Active Problems:   CVA, old, cognitive deficits   UTI (lower urinary tract infection)    Plan: No change in treatments.    LOS: 2 days   Nimesh Riolo L 09/08/2013, 8:39 AM

## 2013-09-08 NOTE — Progress Notes (Signed)
Utilization Review Complete  

## 2013-09-09 DIAGNOSIS — N183 Chronic kidney disease, stage 3 unspecified: Secondary | ICD-10-CM | POA: Diagnosis present

## 2013-09-09 DIAGNOSIS — E43 Unspecified severe protein-calorie malnutrition: Secondary | ICD-10-CM | POA: Diagnosis present

## 2013-09-09 LAB — GLUCOSE, CAPILLARY
Glucose-Capillary: 104 mg/dL — ABNORMAL HIGH (ref 70–99)
Glucose-Capillary: 108 mg/dL — ABNORMAL HIGH (ref 70–99)
Glucose-Capillary: 131 mg/dL — ABNORMAL HIGH (ref 70–99)
Glucose-Capillary: 166 mg/dL — ABNORMAL HIGH (ref 70–99)
Glucose-Capillary: 194 mg/dL — ABNORMAL HIGH (ref 70–99)
Glucose-Capillary: 53 mg/dL — ABNORMAL LOW (ref 70–99)
Glucose-Capillary: 59 mg/dL — ABNORMAL LOW (ref 70–99)
Glucose-Capillary: 64 mg/dL — ABNORMAL LOW (ref 70–99)
Glucose-Capillary: 81 mg/dL (ref 70–99)

## 2013-09-09 LAB — BASIC METABOLIC PANEL
BUN: 32 mg/dL — ABNORMAL HIGH (ref 6–23)
CHLORIDE: 113 meq/L — AB (ref 96–112)
CO2: 22 mEq/L (ref 19–32)
Calcium: 8.5 mg/dL (ref 8.4–10.5)
Creatinine, Ser: 1.48 mg/dL — ABNORMAL HIGH (ref 0.50–1.35)
GFR calc Af Amer: 59 mL/min — ABNORMAL LOW (ref >90)
GFR calc non Af Amer: 51 mL/min — ABNORMAL LOW (ref >90)
GLUCOSE: 94 mg/dL (ref 70–99)
Potassium: 3.5 mEq/L — ABNORMAL LOW (ref 3.7–5.3)
Sodium: 147 mEq/L (ref 137–147)

## 2013-09-09 LAB — HEMOGLOBIN A1C
Hgb A1c MFr Bld: 6.1 % — ABNORMAL HIGH (ref <5.7)
Mean Plasma Glucose: 128 mg/dL — ABNORMAL HIGH (ref <117)

## 2013-09-09 LAB — HEPATIC FUNCTION PANEL
ALBUMIN: 2.5 g/dL — AB (ref 3.5–5.2)
ALT: 18 U/L (ref 0–53)
AST: 22 U/L (ref 0–37)
Alkaline Phosphatase: 51 U/L (ref 39–117)
BILIRUBIN INDIRECT: 0.5 mg/dL (ref 0.3–0.9)
Bilirubin, Direct: 0.2 mg/dL (ref 0.0–0.3)
TOTAL PROTEIN: 5.3 g/dL — AB (ref 6.0–8.3)
Total Bilirubin: 0.7 mg/dL (ref 0.3–1.2)

## 2013-09-09 LAB — URINALYSIS, ROUTINE W REFLEX MICROSCOPIC
Bilirubin Urine: NEGATIVE
GLUCOSE, UA: NEGATIVE mg/dL
Nitrite: NEGATIVE
PROTEIN: NEGATIVE mg/dL
Specific Gravity, Urine: 1.025 (ref 1.005–1.030)
UROBILINOGEN UA: 0.2 mg/dL (ref 0.0–1.0)
pH: 5 (ref 5.0–8.0)

## 2013-09-09 LAB — URINE MICROSCOPIC-ADD ON

## 2013-09-09 LAB — LEVETIRACETAM LEVEL

## 2013-09-09 MED ORDER — DOXYCYCLINE HYCLATE 100 MG PO TABS
100.0000 mg | ORAL_TABLET | Freq: Two times a day (BID) | ORAL | Status: DC
  Administered 2013-09-09 – 2013-09-11 (×5): 100 mg via ORAL
  Filled 2013-09-09 (×5): qty 1

## 2013-09-09 MED ORDER — DEXTROSE 50 % IV SOLN
50.0000 mL | Freq: Once | INTRAVENOUS | Status: AC | PRN
  Administered 2013-09-09: 50 mL via INTRAVENOUS

## 2013-09-09 MED ORDER — DEXTROSE 50 % IV SOLN
INTRAVENOUS | Status: AC
  Filled 2013-09-09: qty 50

## 2013-09-09 NOTE — Progress Notes (Signed)
Inpatient Diabetes Program Recommendations  AACE/ADA: New Consensus Statement on Inpatient Glycemic Control (2013)  Target Ranges:  Prepandial:   less than 140 mg/dL      Peak postprandial:   less than 180 mg/dL (1-2 hours)      Critically ill patients:  140 - 180 mg/dL   Results for WILMER, SANTILLO (MRN [id number]) as of 09/09/2013 10:18  Ref. Range 09/08/2013 00:34 09/08/2013 03:56 09/08/2013 07:46 09/08/2013 12:22 09/08/2013 16:40 09/08/2013 20:19 09/09/2013 00:11 09/09/2013 00:33  Glucose-Capillary Latest Range: 70-99 mg/dL 68 (L) 103 (H) 77 82 71 67 (L) 53 (L) 166 (H)   Inpatient Diabetes Program Recommendations IV fluids: May want to consider adding dextrose to IV fluids if patient will be kept NPO. Correction (SSI): May want to consider ordering Novolog sensitive correction if CBGs consistently greater than 150 mg/dl without hypoglycemia treatment. HgbA1C: A1C ordered today to evaluate glycemic control over the past 2-3 months.  Note: Patient has a history of diabetes and according to the outpatient medication list patient takes Diabeta 5 mg QAM as an outpatient for diabetes management.  Currently, patient is not ordered to receive any medications for inpatient glycemic control; however, blood glucose is being monitored Q4H.  If patient remains NPO and continues to have hypoglycemia, may want to consider adding dextrose to IVF.  Will continue to follow.]  Thanks, Barnie Alderman, RN, MSN, CCRN Diabetes Coordinator Inpatient Diabetes Program [phone] (Team Pager) [phone] ([facility] office) [phone] [facility] office)

## 2013-09-09 NOTE — Clinical Documentation Improvement (Signed)
THIS DOCUMENT IS NOT A PERMANENT PART OF THE MEDICAL RECORD  Please update your documentation with the medical record to reflect your response to this query. If you need help knowing how to do this please call [phone].  09/09/13   Dear Dr. Shawn Carattini/Associates, A review of the patient medical record has revealed the following indicators.   Based on your clinical judgment, please clarify and document in a progress note and/or discharge summary the clinical condition associated with the following supporting information:  Please clarify stage of CKD. Thank you.  Possible Clinical Conditions?   CKD Stage I -  GFR > OR = 90 CKD Stage II - GFR 60-80 CKD Stage III - GFR 30-59 CKD Stage IV - GFR 15-29 CKD Stage V - GFR < 15 ESRD (End Stage Renal Disease) Other condition_____________ Cannot Clinically determine   Supporting Information:  Risk Factors: Progress note 1/4:His renal function is better this morning. He has acute on chronic renal insufficiency, partially from dehydration. Progress note 1/5: He has acute on chronic renal failure which is improving and I think that is related to dehydration  Diagnostics:  Lab:  BUN/CR/GFR: 1/6 = 32/1.48/51 1/5 = 41/1.63/46 1/4 = 56/1.94/37 1/3 = 76/2.63/26 Treatment:  Monitoring BMP daily  You may use possible, probable, or suspect with inpatient documentation. possible, probable, suspected diagnoses MUST be documented at the time of discharge  Reviewed: additional documentation in the medical record   Thank You,  Debora T Williams RN Clinical Documentation Specialist: [place]

## 2013-09-09 NOTE — Progress Notes (Signed)
Subjective: He is a little more alert.  Objective: Vital signs in last 24 hours: Temp:  [97.4 F (36.3 C)-98.1 F (36.7 C)] 98.1 F (36.7 C) (01/06 0447) Pulse Rate:  [57-76] 76 (01/06 0447) Resp:  [20] 20 (01/06 0447) BP: (120-124)/(68-80) 120/80 mmHg (01/06 0447) SpO2:  [100 %] 100 % (01/06 0447) Weight change:  Last BM Date: 09/06/13  Intake/Output from previous day: 01/05 0701 - 01/06 0700 In: -  Out: 500 [Urine:500]  PHYSICAL EXAM General appearance: He is more alert but still not really able to hold a conversation. Resp: clear to auscultation bilaterally Cardio: regular rate and rhythm, S1, S2 normal, no murmur, click, rub or gallop GI: soft, non-tender; bowel sounds normal; no masses,  no organomegaly Extremities: extremities normal, atraumatic, no cyanosis or edema  Lab Results:    Basic Metabolic Panel:  Recent Labs  09/08/13 0539 09/09/13 0508  NA 145 147  K 4.1 3.5*  CL 111 113*  CO2 24 22  GLUCOSE 102* 94  BUN 41* 32*  CREATININE 1.63* 1.48*  CALCIUM 8.7 8.5   Liver Function Tests:  Recent Labs  09/08/13 0539 09/09/13 0508  AST 27 22  ALT 21 18  ALKPHOS 53 51  BILITOT 0.6 0.7  PROT 5.6* 5.3*  ALBUMIN 2.5* 2.5*   No results found for this basename: LIPASE, AMYLASE,  in the last 72 hours  Recent Labs  09/06/13 1717  AMMONIA 10*   CBC:  Recent Labs  09/06/13 1344  WBC 6.8  NEUTROABS 4.9  HGB 12.2*  HCT 36.2*  MCV 91.4  PLT 132*   Cardiac Enzymes:  Recent Labs  09/06/13 1344  TROPONINI <0.30   BNP: No results found for this basename: PROBNP,  in the last 72 hours D-Dimer: No results found for this basename: DDIMER,  in the last 72 hours CBG:  Recent Labs  09/08/13 0746 09/08/13 1222 09/08/13 1640 09/08/13 2019 09/09/13 0011 09/09/13 0033  GLUCAP 77 82 71 67* 53* 166*   Hemoglobin A1C: No results found for this basename: HGBA1C,  in the last 72 hours Fasting Lipid Panel: No results found for this basename:  CHOL, HDL, LDLCALC, TRIG, CHOLHDL, LDLDIRECT,  in the last 72 hours Thyroid Function Tests:  Recent Labs  09/07/13 0614  TSH 0.883   Anemia Panel:  Recent Labs  09/06/13 1717  VITAMINB12 596  FOLATE 6.6   Coagulation: No results found for this basename: LABPROT, INR,  in the last 72 hours Urine Drug Screen: Drugs of Abuse     Component Value Date/Time   LABOPIA NONE DETECTED 06/08/2013 0518   COCAINSCRNUR NONE DETECTED 06/08/2013 0518   LABBENZ NONE DETECTED 06/08/2013 0518   AMPHETMU NONE DETECTED 06/08/2013 0518   THCU NONE DETECTED 06/08/2013 0518   LABBARB NONE DETECTED 06/08/2013 0518    Alcohol Level: No results found for this basename: ETH,  in the last 72 hours Urinalysis:  Recent Labs  09/06/13 1540  COLORURINE YELLOW  LABSPEC 1.025  PHURINE 6.0  GLUCOSEU NEGATIVE  HGBUR MODERATE*  BILIRUBINUR NEGATIVE  KETONESUR NEGATIVE  PROTEINUR 30*  UROBILINOGEN 0.2  NITRITE POSITIVE*  [place]. Labs:  ABGS  Recent Labs  09/06/13 1731  PHART 7.414  PO2ART 102.0*  TCO2 18.3  HCO3 20.0   CULTURES Recent Results (from the past 240 hour(s))  CULTURE, BLOOD (ROUTINE X 2)     Status: None   Collection Time    09/06/13  2:06 PM  Result Value Range Status   Specimen Description RIGHT ANTECUBITAL   Final   Special Requests BOTTLES DRAWN AEROBIC ONLY 6CC   Final   Culture NO GROWTH 2 DAYS   Final   Report Status PENDING   Incomplete  URINE CULTURE     Status: None   Collection Time    09/06/13  3:40 PM      Result Value Range Status   Specimen Description URINE, CATHETERIZED   Final   Special Requests NONE   Final   Culture  Setup Time     Final   Value: 09/06/2013 21:00     Performed at [facility]     Final   Value: >=100,000 COLONIES/ML     Performed at [facility]   Culture     Final   Value: STAPHYLOCOCCUS AUREUS     Note: RIFAMPIN AND GENTAMICIN SHOULD NOT BE USED AS SINGLE DRUGS FOR  TREATMENT OF STAPH INFECTIONS.     Performed at [facility]   Report Status 09/08/2013 FINAL   Final   Organism ID, Bacteria STAPHYLOCOCCUS AUREUS   Final  CULTURE, BLOOD (ROUTINE X 2)     Status: None   Collection Time    09/06/13  5:07 PM      Result Value Range Status   Specimen Description RIGHT ANTECUBITAL   Final   Special Requests BOTTLES DRAWN AEROBIC ONLY 6CC   Final   Culture NO GROWTH 2 DAYS   Final   Report Status PENDING   Incomplete   Studies/Results: Dg Skull 1-3 Views  09/07/2013   CLINICAL DATA:  Patient found on floor in room. Combative been only and cooperate. Head CT 09/06/2013  EXAM: SKULL - 1-3 VIEW  COMPARISON:  None.  FINDINGS: There is no evidence of skull fracture or other focal bone lesions.  IMPRESSION: Negative.   Electronically Signed   By: Curlene Dolphin M.D.   On: 09/07/2013 23:58   Dg Thoracic Spine 2 View  09/07/2013   CLINICAL DATA:  Fall.  Combative.  EXAM: THORACIC SPINE - 2 VIEW  COMPARISON:  Chest x-ray 06/10/2013  FINDINGS: No acute thoracic spine fracture appreciated. No subluxation. Evaluation limited by a patient condition/motion. Mild height loss and anterior superior endplate spurring of X93 or L1, stable from prior.  IMPRESSION: No evidence of acute thoracic spine injury.   Electronically Signed   By: Jorje Guild M.D.   On: 09/07/2013 23:58    Medications:  Prior to Admission:  Prescriptions prior to admission  Medication Sig Dispense Refill  . aspirin 325 MG tablet Take 325 mg by mouth daily.        . baclofen (LIORESAL) 10 MG tablet Take 10 mg by mouth 3 (three) times daily.      . benztropine (COGENTIN) 1 MG tablet Take 1 mg by mouth 3 (three) times daily.      . cloNIDine (CATAPRES) 0.3 MG tablet Take 0.3 mg by mouth daily.        . divalproex (DEPAKOTE) 250 MG DR tablet Take 250 mg by mouth 3 (three) times daily.       . feeding supplement, RESOURCE BREEZE, (RESOURCE BREEZE) LIQD Take 1 Container by mouth 3 (three) times daily  between meals.      . fluticasone (FLONASE) 50 MCG/ACT nasal spray Place 2 sprays into the nose daily.      [name] glyBURIDE (DIABETA) 5 MG tablet Take 5 mg by mouth  daily with breakfast.        . labetalol (NORMODYNE) 200 MG tablet Take 200 mg by mouth 2 (two) times daily.        . potassium chloride (KLOR-CON) 20 MEQ packet Take 20 mEq by mouth daily.        . ramipril (ALTACE) 10 MG tablet Take 10 mg by mouth daily.        . risperiDONE (RISPERDAL) 1 MG tablet Take 1 tablet (1 mg total) by mouth 3 (three) times daily.  90 tablet  2  . simvastatin (ZOCOR) 40 MG tablet Take 40 mg by mouth at bedtime.        . temazepam (RESTORIL) 15 MG capsule Take 15 mg by mouth at bedtime.         Scheduled: . aspirin  325 mg Oral Daily  . benztropine  1 mg Oral TID  . cloNIDine  0.3 mg Oral Daily  . divalproex  250 mg Oral TID  . enoxaparin (LOVENOX) injection  40 mg Subcutaneous Q24H  . feeding supplement (PRO-STAT SUGAR FREE 64)  30 mL Oral TID WC  . feeding supplement (RESOURCE BREEZE)  1 Container Oral TID BM  . labetalol  200 mg Oral BID  . multivitamin with minerals  1 tablet Oral Daily  . piperacillin-tazobactam (ZOSYN)  IV  3.375 g Intravenous Q8H  . ramipril  5 mg Oral Daily  . simvastatin  40 mg Oral QHS  . vancomycin  750 mg Intravenous Q24H   Continuous: . sodium chloride 1,000 mL (09/06/13 1905)   EPP:IRJJOACZY  Assesment: He has altered mental status on the basis of a urinary tract infection. This appears to be from Staphylococcus. At baseline he has bipolar disease and has cognitive defects from a previous cerebrovascular accident. He was dehydrated on admission which I think is better. He has chronic kidney disease which is somewhat acute on chronic and has improved. He has severe protein calorie malnutrition. He has had problems with severe hypertension. He has a cardiomyopathy. He has had repair of an aortic aneurysm. Active Problems:   HYPERTENSION, UNSPECIFIED   CARDIOMYOPATHY  OTHER DISEASES CLASSIFIED ELSW   Bipolar I disorder, most recent episode mixed   Altered mental status   Dehydration   CVA, old, cognitive deficits   UTI (lower urinary tract infection)   Protein-calorie malnutrition, severe   CKD (chronic kidney disease) stage 3, GFR 30-59 ml/min    Plan: Continue current treatments.    LOS: 3 days   Bernarda Erck L 09/09/2013, 8:48 AM

## 2013-09-09 NOTE — Progress Notes (Signed)
Pt CBG results remain low.  Pt given D50 at 0020 and CBG increased.  At 0400 blood sugar low again.  Pt drank two breeze supplements and blood sugar increased.

## 2013-09-09 NOTE — Care Management Note (Signed)
    Page 1 of 1   09/09/2013     2:46:00 PM   CARE MANAGEMENT NOTE 09/09/2013  Patient:  Derrick Butler, Derrick Butler   Account Number:  [id number]  Date Initiated:  09/09/2013  Documentation initiated by:  Claretha Cooper  Subjective/Objective Assessment:   Pt from [facility] and plans to return to facility upon DC.     Action/Plan:   Anticipated DC Date:     Anticipated DC Plan:  SKILLED NURSING FACILITY  In-house referral  Clinical Social Worker      DC Planning Services  CM consult      Choice offered to / List presented to:             Status of service:  Completed, signed off Medicare Important Message given?   (If response is "NO", the following Medicare IM given date fields will be blank) Date Medicare IM given:   Date Additional Medicare IM given:    Discharge Disposition:    Per UR Regulation:    If discussed at Long Length of Stay Meetings, dates discussed:    Comments:  09/09/13 Claretha Cooper RN BSN CM CSW assisting with DC plans back to SNF.

## 2013-09-10 LAB — CBC
HCT: 27.7 % — ABNORMAL LOW (ref 39.0–52.0)
Hemoglobin: 9.4 g/dL — ABNORMAL LOW (ref 13.0–17.0)
MCH: 31 pg (ref 26.0–34.0)
MCHC: 33.9 g/dL (ref 30.0–36.0)
MCV: 91.4 fL (ref 78.0–100.0)
Platelets: 101 10*3/uL — ABNORMAL LOW (ref 150–400)
RBC: 3.03 MIL/uL — ABNORMAL LOW (ref 4.22–5.81)
RDW: 13.4 % (ref 11.5–15.5)
WBC: 4.2 10*3/uL (ref 4.0–10.5)

## 2013-09-10 LAB — URINE CULTURE
Colony Count: NO GROWTH
Culture: NO GROWTH

## 2013-09-10 LAB — GLUCOSE, CAPILLARY
GLUCOSE-CAPILLARY: 174 mg/dL — AB (ref 70–99)
GLUCOSE-CAPILLARY: 98 mg/dL (ref 70–99)
Glucose-Capillary: 109 mg/dL — ABNORMAL HIGH (ref 70–99)
Glucose-Capillary: 147 mg/dL — ABNORMAL HIGH (ref 70–99)
Glucose-Capillary: 52 mg/dL — ABNORMAL LOW (ref 70–99)
Glucose-Capillary: 79 mg/dL (ref 70–99)
Glucose-Capillary: 80 mg/dL (ref 70–99)

## 2013-09-10 MED ORDER — RISPERIDONE 1 MG PO TABS
1.0000 mg | ORAL_TABLET | Freq: Three times a day (TID) | ORAL | Status: DC
  Administered 2013-09-10 – 2013-09-11 (×5): 1 mg via ORAL
  Filled 2013-09-10 (×5): qty 1

## 2013-09-10 MED ORDER — TEMAZEPAM 15 MG PO CAPS
15.0000 mg | ORAL_CAPSULE | Freq: Every day | ORAL | Status: DC
  Administered 2013-09-10: 15 mg via ORAL
  Filled 2013-09-10: qty 1

## 2013-09-10 NOTE — Clinical Social Work Note (Signed)
CSW updated [facility] on pt. Pt more alert today. Possible d/c tomorrow per MD.   Benay Pike, [place]

## 2013-09-10 NOTE — Progress Notes (Signed)
Subjective: He is much more alert today. He says he would like some orange juice. He has no other new complaints. He is disoriented to place and time  Objective: Vital signs in last 24 hours: Temp:  [97.3 F (36.3 C)-97.7 F (36.5 C)] 97.7 F (36.5 C) (01/07 0411) Pulse Rate:  [46-63] 63 (01/07 0411) Resp:  [18-20] 20 (01/07 0411) BP: (90-120)/(44-74) 92/44 mmHg (01/07 0411) SpO2:  [100 %] 100 % (01/07 0411) Weight change:  Last BM Date: 09/08/13  Intake/Output from previous day: 01/06 0701 - 01/07 0700 In: -  Out: 250 [Urine:250]  PHYSICAL EXAM General appearance: alert, mild distress and Agitated Resp: clear to auscultation bilaterally Cardio: regular rate and rhythm, S1, S2 normal, no murmur, click, rub or gallop GI: soft, non-tender; bowel sounds normal; no masses,  no organomegaly Extremities: extremities normal, atraumatic, no cyanosis or edema  Lab Results:    Basic Metabolic Panel:  Recent Labs  09/08/13 0539 09/09/13 0508  NA 145 147  K 4.1 3.5*  CL 111 113*  CO2 24 22  GLUCOSE 102* 94  BUN 41* 32*  CREATININE 1.63* 1.48*  CALCIUM 8.7 8.5   Liver Function Tests:  Recent Labs  09/08/13 0539 09/09/13 0508  AST 27 22  ALT 21 18  ALKPHOS 53 51  BILITOT 0.6 0.7  PROT 5.6* 5.3*  ALBUMIN 2.5* 2.5*   No results found for this basename: LIPASE, AMYLASE,  in the last 72 hours No results found for this basename: AMMONIA,  in the last 72 hours CBC:  Recent Labs  09/10/13 0454  WBC 4.2  HGB 9.4*  HCT 27.7*  MCV 91.4  PLT 101*   Cardiac Enzymes: No results found for this basename: CKTOTAL, CKMB, CKMBINDEX, TROPONINI,  in the last 72 hours BNP: No results found for this basename: PROBNP,  in the last 72 hours D-Dimer: No results found for this basename: DDIMER,  in the last 72 hours CBG:  Recent Labs  09/09/13 1115 09/09/13 1638 09/09/13 2014 09/10/13 0002 09/10/13 0408 09/10/13 0737  GLUCAP 81 131* 108* 80 174* 52*   Hemoglobin  A1C:  Recent Labs  09/09/13 1033  HGBA1C 6.1*   Fasting Lipid Panel: No results found for this basename: CHOL, HDL, LDLCALC, TRIG, CHOLHDL, LDLDIRECT,  in the last 72 hours Thyroid Function Tests: No results found for this basename: TSH, T4TOTAL, FREET4, T3FREE, THYROIDAB,  in the last 72 hours Anemia Panel: No results found for this basename: VITAMINB12, FOLATE, FERRITIN, TIBC, IRON, RETICCTPCT,  in the last 72 hours Coagulation: No results found for this basename: LABPROT, INR,  in the last 72 hours Urine Drug Screen: Drugs of Abuse     Component Value Date/Time   LABOPIA NONE DETECTED 06/08/2013 0518   COCAINSCRNUR NONE DETECTED 06/08/2013 0518   LABBENZ NONE DETECTED 06/08/2013 0518   AMPHETMU NONE DETECTED 06/08/2013 0518   THCU NONE DETECTED 06/08/2013 0518   LABBARB NONE DETECTED 06/08/2013 0518    Alcohol Level: No results found for this basename: ETH,  in the last 72 hours Urinalysis:  Recent Labs  09/09/13 1530  COLORURINE YELLOW  LABSPEC 1.025  PHURINE 5.0  GLUCOSEU NEGATIVE  HGBUR TRACE*  BILIRUBINUR NEGATIVE  KETONESUR TRACE*  PROTEINUR NEGATIVE  UROBILINOGEN 0.2  NITRITE NEGATIVE  [place]. Labs:  ABGS No results found for this basename: PHART, PCO2, PO2ART, TCO2, HCO3,  in the last 72 hours CULTURES Recent Results (from the past 240 hour(s))  CULTURE, BLOOD (ROUTINE X  2)     Status: None   Collection Time    09/06/13  2:06 PM      Result Value Range Status   Specimen Description RIGHT ANTECUBITAL   Final   Special Requests BOTTLES DRAWN AEROBIC ONLY 6CC   Final   Culture NO GROWTH 3 DAYS   Final   Report Status PENDING   Incomplete  URINE CULTURE     Status: None   Collection Time    09/06/13  3:40 PM      Result Value Range Status   Specimen Description URINE, CATHETERIZED   Final   Special Requests NONE   Final   Culture  Setup Time     Final   Value: 09/06/2013 21:00     Performed at [facility] Count      Final   Value: >=100,000 COLONIES/ML     Performed at [facility]   Culture     Final   Value: STAPHYLOCOCCUS AUREUS     Note: RIFAMPIN AND GENTAMICIN SHOULD NOT BE USED AS SINGLE DRUGS FOR TREATMENT OF STAPH INFECTIONS.     Performed at [facility]   Report Status 09/08/2013 FINAL   Final   Organism ID, Bacteria STAPHYLOCOCCUS AUREUS   Final  CULTURE, BLOOD (ROUTINE X 2)     Status: None   Collection Time    09/06/13  5:07 PM      Result Value Range Status   Specimen Description RIGHT ANTECUBITAL   Final   Special Requests BOTTLES DRAWN AEROBIC ONLY 6CC   Final   Culture NO GROWTH 3 DAYS   Final   Report Status PENDING   Incomplete   Studies/Results: No results found.  Medications:  Prior to Admission:  Prescriptions prior to admission  Medication Sig Dispense Refill  . aspirin 325 MG tablet Take 325 mg by mouth daily.        . baclofen (LIORESAL) 10 MG tablet Take 10 mg by mouth 3 (three) times daily.      . benztropine (COGENTIN) 1 MG tablet Take 1 mg by mouth 3 (three) times daily.      . cloNIDine (CATAPRES) 0.3 MG tablet Take 0.3 mg by mouth daily.        . divalproex (DEPAKOTE) 250 MG DR tablet Take 250 mg by mouth 3 (three) times daily.       . feeding supplement, RESOURCE BREEZE, (RESOURCE BREEZE) LIQD Take 1 Container by mouth 3 (three) times daily between meals.      . fluticasone (FLONASE) 50 MCG/ACT nasal spray Place 2 sprays into the nose daily.      [name] glyBURIDE (DIABETA) 5 MG tablet Take 5 mg by mouth daily with breakfast.        . labetalol (NORMODYNE) 200 MG tablet Take 200 mg by mouth 2 (two) times daily.        . potassium chloride (KLOR-CON) 20 MEQ packet Take 20 mEq by mouth daily.        . ramipril (ALTACE) 10 MG tablet Take 10 mg by mouth daily.        . risperiDONE (RISPERDAL) 1 MG tablet Take 1 tablet (1 mg total) by mouth 3 (three) times daily.  90 tablet  2  . simvastatin (ZOCOR) 40 MG tablet Take 40 mg by mouth at bedtime.         . temazepam (RESTORIL) 15 MG capsule Take 15 mg by mouth at bedtime.  Scheduled: . aspirin  325 mg Oral Daily  . benztropine  1 mg Oral TID  . cloNIDine  0.3 mg Oral Daily  . divalproex  250 mg Oral TID  . doxycycline  100 mg Oral Q12H  . enoxaparin (LOVENOX) injection  40 mg Subcutaneous Q24H  . feeding supplement (PRO-STAT SUGAR FREE 64)  30 mL Oral TID WC  . feeding supplement (RESOURCE BREEZE)  1 Container Oral TID BM  . labetalol  200 mg Oral BID  . multivitamin with minerals  1 tablet Oral Daily  . ramipril  5 mg Oral Daily  . risperiDONE  1 mg Oral TID  . simvastatin  40 mg Oral QHS  . temazepam  15 mg Oral QHS   Continuous: . sodium chloride 100 mL/hr at 09/10/13 0217   [id number]  Assesment: He has altered mental status secondary to Staphylococcus urinary tract infection. This is sensitive to multiple antibiotics and is not methicillin-resistant. He is now on doxycycline. He had acute on chronic renal failure and that is better. I think that was related to dehydration. He has bipolar disease and is still disoriented. He has multi-infarct dementia and I think that's about the same. Active Problems:   HYPERTENSION, UNSPECIFIED   CARDIOMYOPATHY OTHER DISEASES CLASSIFIED ELSW   Bipolar I disorder, most recent episode mixed   Altered mental status   Dehydration   CVA, old, cognitive deficits   UTI (lower urinary tract infection)   Protein-calorie malnutrition, severe   CKD (chronic kidney disease) stage 3, GFR 30-59 ml/min    Plan: Continue treatment    LOS: 4 days   Alizabeth Antonio L 09/10/2013, 8:52 AM

## 2013-09-10 NOTE — Progress Notes (Signed)
Hypoglycemic Event  CBG: 52  Treatment: 15 GM carbohydrate snack  Symptoms: None  Follow-up CBG: [id number]  CBG Result: 79  Possible Reasons for Event: Inadequate meal intake and Unknown  Comments/MD notified: Dr. Luan Pulling notified    Jacquie Lukes  Remember to initiate Hypoglycemia Order Set & complete

## 2013-09-10 NOTE — Progress Notes (Signed)
Late entry for Sunday 09/07/13. At approximately 2050 the bed alarm sounded in room 328, went in room and found patient in the floor. Patient did not appear injured other than abrasions on his back and shoulder. On call physician was called, x-rays were ordered.  All other assessments were negative for injury.

## 2013-09-11 LAB — GLUCOSE, CAPILLARY
GLUCOSE-CAPILLARY: 152 mg/dL — AB (ref 70–99)
GLUCOSE-CAPILLARY: 115 mg/dL — AB (ref 70–99)
Glucose-Capillary: 76 mg/dL (ref 70–99)
Glucose-Capillary: 140 mg/dL — ABNORMAL HIGH (ref 70–99)

## 2013-09-11 LAB — CULTURE, BLOOD (ROUTINE X 2)
CULTURE: NO GROWTH
Culture: NO GROWTH

## 2013-09-11 MED ORDER — DOXYCYCLINE HYCLATE 100 MG PO TABS: 100.0000 mg | ORAL_TABLET | Freq: Two times a day (BID) | ORAL | Status: DC

## 2013-09-11 NOTE — Clinical Social Work Note (Addendum)
Pt d/c today back to [facility]. Pt's family and facility aware and agreeable. D/C summary faxed. Facility notified that all medications aren't listed on d/c summary and will follow AVS for meds. Pt to transfer via [facility].  Benay Pike, [place]

## 2013-09-11 NOTE — Discharge Summary (Signed)
NAME:  Derrick Butler, Derrick Butler              ACCOUNT NO.:  [id number]  MEDICAL RECORD NO.:  [id number]  LOCATION:  [facility]                         FACILITY:  [facility]  PHYSICIAN:  Bookert Guzzi L. Luan Pulling, M.D.DATE OF BIRTH:  Feb 06, 1956  DATE OF ADMISSION:  09/06/2013 DATE OF DISCHARGE:  LH                              DISCHARGE SUMMARY   FINAL DISCHARGE DIAGNOSES: 1. Staphylococcal urinary tract infection. 2. Altered mental status secondary to Staphylococcal urinary tract     infection. 3. Bipolar disorder. 4. Cardiomyopathy. 5. Chronic kidney disease, stage III. 6. Previous cerebrovascular accident. 7. Dehydration. 8. Hypertension. 9. Severe protein-calorie malnutrition.  HISTORY:  This is a 58 year old, who is a resident at a skilled care facility.  He had been in his usual state of poor health and developed altered mental status.  He was brought to the emergency department where he was noted to be poorly responsive and worsened chronic renal failure, and had what appeared to be a urinary tract infection.  He was felt to be dehydrated as well.  His physical exam showed that he was poorly responsive.  His chest showed rhonchi bilaterally.  Heart was regular and his urinalysis showed that he had significant white blood cells, etc.  His BUN and creatinine on admission were 56 and 1.94 respectively, and had come down to 32 at 1.48 by September 09, 2013.  He was given IV fluids, IV antibiotics, and then his urine grew Staph, which was sensitive to most antibiotics and was not methicillin-resistant.  He was switched to doxycycline.  His mental status improved and he was ready for discharge back to his skilled care facility on September 11, 2013.  He will be on doxycycline 100 mg b.i.d. for 7 more days.  He will continue with PT/OT and Speech as needed.  His other medications are unchanged.     Chuckie Mccathern L. Luan Pulling, M.D.     ELH/MEDQ  D:  09/11/2013  T:  09/11/2013  Job:  [id number]

## 2013-09-11 NOTE — Progress Notes (Signed)
I was hoping that he could be transferred back to his skilled care facility today but this morning I can't arouse him. I will wait a little bit and see if he becomes more alert and makes some sort of a decision about whether he can be transferred or not

## 2013-09-11 NOTE — Progress Notes (Signed)
Patient with orders to be discharge to [facility]. Discharge packet sent with patient. Report called to nurse at [place]. Patient stable. Patient transported via EMS.

## 2013-09-12 NOTE — Progress Notes (Signed)
UR chart review completed.  

## 2013-09-12 NOTE — Progress Notes (Signed)
NAME:  Derrick Butler, Derrick Butler              ACCOUNT NO.:  [id number]  MEDICAL RECORD NO.:  [id number]  LOCATION:  [facility]                         FACILITY:  [facility]  PHYSICIAN:  Hailey Miles L. Luan Pulling, M.D.DATE OF BIRTH:  02-02-1956  DATE OF PROCEDURE: DATE OF DISCHARGE:                                PROGRESS NOTE   After I saw [name] this morning, he has become much more responsive and is back at his baseline.  I think he is okay for discharge back to his skilled care facility.     Mahnoor Mathisen L. Luan Pulling, M.D.     ELH/MEDQ  D:  09/11/2013  T:  09/12/2013  Job:  [id number]

## 2014-07-13 ENCOUNTER — Emergency Department (HOSPITAL_COMMUNITY): Payer: PRIVATE HEALTH INSURANCE

## 2014-07-13 ENCOUNTER — Inpatient Hospital Stay (HOSPITAL_COMMUNITY)
Admission: EM | Admit: 2014-07-13 | Discharge: 2014-07-16 | DRG: 871 | Disposition: A | Discharge status: Skilled Nursing Facility | Payer: PRIVATE HEALTH INSURANCE | Attending: Family Medicine | Admitting: Family Medicine

## 2014-07-13 ENCOUNTER — Encounter (HOSPITAL_COMMUNITY): Payer: Self-pay | Admitting: Cardiology

## 2014-07-13 DIAGNOSIS — Z818 Family history of other mental and behavioral disorders: Secondary | ICD-10-CM

## 2014-07-13 DIAGNOSIS — I429 Cardiomyopathy, unspecified: Secondary | ICD-10-CM | POA: Diagnosis present

## 2014-07-13 DIAGNOSIS — Z820 Family history of epilepsy and other diseases of the nervous system: Secondary | ICD-10-CM

## 2014-07-13 DIAGNOSIS — Z7982 Long term (current) use of aspirin: Secondary | ICD-10-CM | POA: Diagnosis not present

## 2014-07-13 DIAGNOSIS — Z823 Family history of stroke: Secondary | ICD-10-CM | POA: Diagnosis not present

## 2014-07-13 DIAGNOSIS — Z23 Encounter for immunization: Secondary | ICD-10-CM

## 2014-07-13 DIAGNOSIS — G9340 Encephalopathy, unspecified: Secondary | ICD-10-CM

## 2014-07-13 DIAGNOSIS — Z8249 Family history of ischemic heart disease and other diseases of the circulatory system: Secondary | ICD-10-CM

## 2014-07-13 DIAGNOSIS — J189 Pneumonia, unspecified organism: Secondary | ICD-10-CM | POA: Diagnosis present

## 2014-07-13 DIAGNOSIS — N183 Chronic kidney disease, stage 3 unspecified: Secondary | ICD-10-CM | POA: Diagnosis present

## 2014-07-13 DIAGNOSIS — A419 Sepsis, unspecified organism: Principal | ICD-10-CM

## 2014-07-13 DIAGNOSIS — R52 Pain, unspecified: Secondary | ICD-10-CM

## 2014-07-13 DIAGNOSIS — I69319 Unspecified symptoms and signs involving cognitive functions following cerebral infarction: Secondary | ICD-10-CM

## 2014-07-13 DIAGNOSIS — I6931 Cognitive deficits following cerebral infarction: Secondary | ICD-10-CM

## 2014-07-13 DIAGNOSIS — R404 Transient alteration of awareness: Secondary | ICD-10-CM

## 2014-07-13 DIAGNOSIS — I2510 Atherosclerotic heart disease of native coronary artery without angina pectoris: Secondary | ICD-10-CM | POA: Diagnosis present

## 2014-07-13 DIAGNOSIS — D696 Thrombocytopenia, unspecified: Secondary | ICD-10-CM | POA: Diagnosis present

## 2014-07-13 DIAGNOSIS — E860 Dehydration: Secondary | ICD-10-CM | POA: Diagnosis present

## 2014-07-13 DIAGNOSIS — F3160 Bipolar disorder, current episode mixed, unspecified: Secondary | ICD-10-CM | POA: Diagnosis present

## 2014-07-13 DIAGNOSIS — E119 Type 2 diabetes mellitus without complications: Secondary | ICD-10-CM | POA: Diagnosis present

## 2014-07-13 DIAGNOSIS — I10 Essential (primary) hypertension: Secondary | ICD-10-CM | POA: Diagnosis present

## 2014-07-13 DIAGNOSIS — Y95 Nosocomial condition: Secondary | ICD-10-CM | POA: Diagnosis present

## 2014-07-13 DIAGNOSIS — Z79899 Other long term (current) drug therapy: Secondary | ICD-10-CM

## 2014-07-13 DIAGNOSIS — Z8673 Personal history of transient ischemic attack (TIA), and cerebral infarction without residual deficits: Secondary | ICD-10-CM

## 2014-07-13 DIAGNOSIS — I129 Hypertensive chronic kidney disease with stage 1 through stage 4 chronic kidney disease, or unspecified chronic kidney disease: Secondary | ICD-10-CM | POA: Diagnosis present

## 2014-07-13 DIAGNOSIS — R4182 Altered mental status, unspecified: Secondary | ICD-10-CM

## 2014-07-13 LAB — CBC WITH DIFFERENTIAL/PLATELET
Basophils Absolute: 0 10*3/uL (ref 0.0–0.1)
Basophils Relative: 0 % (ref 0–1)
EOS ABS: 0.2 10*3/uL (ref 0.0–0.7)
EOS PCT: 2 % (ref 0–5)
HCT: 36.3 % — ABNORMAL LOW (ref 39.0–52.0)
HEMOGLOBIN: 12.5 g/dL — AB (ref 13.0–17.0)
LYMPHS ABS: 2.1 10*3/uL (ref 0.7–4.0)
Lymphocytes Relative: 19 % (ref 12–46)
MCH: 30.7 pg (ref 26.0–34.0)
MCHC: 34.4 g/dL (ref 30.0–36.0)
MCV: 89.2 fL (ref 78.0–100.0)
MONO ABS: 0.5 10*3/uL (ref 0.1–1.0)
MONOS PCT: 4 % (ref 3–12)
Neutro Abs: 8.3 10*3/uL — ABNORMAL HIGH (ref 1.7–7.7)
Neutrophils Relative %: 75 % (ref 43–77)
Platelets: 86 10*3/uL — ABNORMAL LOW (ref 150–400)
RBC: 4.07 MIL/uL — ABNORMAL LOW (ref 4.22–5.81)
RDW: 12 % (ref 11.5–15.5)
WBC: 11 10*3/uL — ABNORMAL HIGH (ref 4.0–10.5)

## 2014-07-13 LAB — URINALYSIS, ROUTINE W REFLEX MICROSCOPIC
Bilirubin Urine: NEGATIVE
Glucose, UA: 100 mg/dL — AB
HGB URINE DIPSTICK: NEGATIVE
Ketones, ur: NEGATIVE mg/dL
Leukocytes, UA: NEGATIVE
Nitrite: NEGATIVE
Protein, ur: NEGATIVE mg/dL
Specific Gravity, Urine: 1.025 (ref 1.005–1.030)
Urobilinogen, UA: 0.2 mg/dL (ref 0.0–1.0)
pH: 5 (ref 5.0–8.0)

## 2014-07-13 LAB — COMPREHENSIVE METABOLIC PANEL
ALK PHOS: 79 U/L (ref 39–117)
ALT: 16 U/L (ref 0–53)
ANION GAP: 12 (ref 5–15)
AST: 13 U/L (ref 0–37)
Albumin: 3.5 g/dL (ref 3.5–5.2)
BUN: 33 mg/dL — ABNORMAL HIGH (ref 6–23)
CALCIUM: 9.4 mg/dL (ref 8.4–10.5)
CO2: 23 mEq/L (ref 19–32)
CREATININE: 1.75 mg/dL — AB (ref 0.50–1.35)
Chloride: 103 mEq/L (ref 96–112)
GFR calc Af Amer: 48 mL/min — ABNORMAL LOW (ref >90)
GFR calc non Af Amer: 41 mL/min — ABNORMAL LOW (ref >90)
GLUCOSE: 144 mg/dL — AB (ref 70–99)
Potassium: 4.6 mEq/L (ref 3.7–5.3)
Sodium: 138 mEq/L (ref 137–147)
TOTAL PROTEIN: 6.6 g/dL (ref 6.0–8.3)
Total Bilirubin: 0.4 mg/dL (ref 0.3–1.2)

## 2014-07-13 LAB — LACTIC ACID, PLASMA: LACTIC ACID, VENOUS: 3 mmol/L — AB (ref 0.5–2.2)

## 2014-07-13 LAB — RAPID URINE DRUG SCREEN, HOSP PERFORMED
Amphetamines: NOT DETECTED
BARBITURATES: NOT DETECTED
BENZODIAZEPINES: NOT DETECTED
Cocaine: NOT DETECTED
Opiates: NOT DETECTED
Tetrahydrocannabinol: NOT DETECTED

## 2014-07-13 LAB — MRSA PCR SCREENING: MRSA by PCR: POSITIVE — AB

## 2014-07-13 LAB — AMMONIA: Ammonia: 21 umol/L (ref 11–60)

## 2014-07-13 LAB — TROPONIN I: Troponin I: <0.3 ng/mL (ref <0.30)

## 2014-07-13 LAB — GLUCOSE, CAPILLARY: GLUCOSE-CAPILLARY: 222 mg/dL — AB (ref 70–99)

## 2014-07-13 MED ORDER — CLONIDINE HCL 0.2 MG PO TABS
0.3000 mg | ORAL_TABLET | Freq: Every day | ORAL | Status: DC
  Administered 2014-07-14 – 2014-07-16 (×3): 0.3 mg via ORAL
  Filled 2014-07-13 (×6): qty 1

## 2014-07-13 MED ORDER — BACLOFEN 10 MG PO TABS
10.0000 mg | ORAL_TABLET | Freq: Three times a day (TID) | ORAL | Status: DC
  Administered 2014-07-13 – 2014-07-16 (×8): 10 mg via ORAL
  Filled 2014-07-13 (×11): qty 1

## 2014-07-13 MED ORDER — INFLUENZA VAC SPLIT QUAD 0.5 ML IM SUSY
0.5000 mL | PREFILLED_SYRINGE | INTRAMUSCULAR | Status: DC
  Filled 2014-07-13: qty 0.5

## 2014-07-13 MED ORDER — FLUTICASONE PROPIONATE 50 MCG/ACT NA SUSP: 2.0000 | Freq: Every day | NASAL | Status: DC | PRN

## 2014-07-13 MED ORDER — ASPIRIN 325 MG PO TABS
325.0000 mg | ORAL_TABLET | Freq: Every day | ORAL | Status: DC
  Administered 2014-07-14 – 2014-07-16 (×3): 325 mg via ORAL
  Filled 2014-07-13 (×3): qty 1

## 2014-07-13 MED ORDER — PIPERACILLIN-TAZOBACTAM 3.375 G IVPB
3.3750 g | Freq: Three times a day (TID) | INTRAVENOUS | Status: DC
  Administered 2014-07-13 – 2014-07-15 (×6): 3.375 g via INTRAVENOUS
  Filled 2014-07-13 (×9): qty 50

## 2014-07-13 MED ORDER — MUPIROCIN 2 % EX OINT
1.0000 "application " | TOPICAL_OINTMENT | Freq: Two times a day (BID) | CUTANEOUS | Status: DC
  Administered 2014-07-14 – 2014-07-16 (×5): 1 via NASAL
  Filled 2014-07-13: qty 22

## 2014-07-13 MED ORDER — CHLORHEXIDINE GLUCONATE CLOTH 2 % EX PADS
6.0000 | MEDICATED_PAD | Freq: Every day | CUTANEOUS | Status: DC
  Administered 2014-07-14 – 2014-07-16 (×2): 6 via TOPICAL

## 2014-07-13 MED ORDER — QUETIAPINE FUMARATE 200 MG PO TABS
200.0000 mg | ORAL_TABLET | Freq: Two times a day (BID) | ORAL | Status: DC
  Administered 2014-07-13 – 2014-07-16 (×6): 200 mg via ORAL
  Filled 2014-07-13 (×2): qty 1
  Filled 2014-07-13: qty 2
  Filled 2014-07-13 (×2): qty 1
  Filled 2014-07-13 (×3): qty 2

## 2014-07-13 MED ORDER — VANCOMYCIN HCL 10 G IV SOLR
1250.0000 mg | INTRAVENOUS | Status: DC
  Administered 2014-07-14 – 2014-07-15 (×2): 1250 mg via INTRAVENOUS
  Filled 2014-07-13 (×3): qty 1250

## 2014-07-13 MED ORDER — SODIUM CHLORIDE 0.9 % IV BOLUS (SEPSIS)
1000.0000 mL | Freq: Once | INTRAVENOUS | Status: AC
  Administered 2014-07-13: 1000 mL via INTRAVENOUS

## 2014-07-13 MED ORDER — PNEUMOCOCCAL VAC POLYVALENT 25 MCG/0.5ML IJ INJ
0.5000 mL | INJECTION | INTRAMUSCULAR | Status: DC
  Filled 2014-07-13: qty 0.5

## 2014-07-13 MED ORDER — RISPERIDONE 1 MG PO TABS
1.0000 mg | ORAL_TABLET | Freq: Three times a day (TID) | ORAL | Status: DC
  Administered 2014-07-13 – 2014-07-16 (×8): 1 mg via ORAL
  Filled 2014-07-13 (×8): qty 1

## 2014-07-13 MED ORDER — DEXTROSE 5 % IV SOLN: 1.0000 g | INTRAVENOUS | Status: DC

## 2014-07-13 MED ORDER — BACLOFEN 10 MG PO TABS
ORAL_TABLET | ORAL | Status: AC
  Filled 2014-07-13: qty 1

## 2014-07-13 MED ORDER — QUETIAPINE FUMARATE 100 MG PO TABS
ORAL_TABLET | ORAL | Status: AC
  Filled 2014-07-13: qty 2

## 2014-07-13 MED ORDER — INSULIN ASPART 100 UNIT/ML ~~LOC~~ SOLN
0.0000 [IU] | Freq: Every day | SUBCUTANEOUS | Status: DC
Start: 2014-07-13 — End: 2014-07-16
  Administered 2014-07-13 – 2014-07-14 (×2): 2 [IU] via SUBCUTANEOUS

## 2014-07-13 MED ORDER — TEMAZEPAM 15 MG PO CAPS
15.0000 mg | ORAL_CAPSULE | Freq: Every day | ORAL | Status: DC
  Administered 2014-07-13 – 2014-07-15 (×2): 15 mg via ORAL
  Filled 2014-07-13 (×3): qty 1

## 2014-07-13 MED ORDER — PIPERACILLIN-TAZOBACTAM 3.375 G IVPB
INTRAVENOUS | Status: AC
  Filled 2014-07-13: qty 100

## 2014-07-13 MED ORDER — VANCOMYCIN HCL IN DEXTROSE 1-5 GM/200ML-% IV SOLN
1000.0000 mg | Freq: Once | INTRAVENOUS | Status: AC
  Administered 2014-07-13: 1000 mg via INTRAVENOUS
  Filled 2014-07-13: qty 200

## 2014-07-13 MED ORDER — SODIUM CHLORIDE 0.9 % IV SOLN
INTRAVENOUS | Status: DC
  Administered 2014-07-13 – 2014-07-15 (×3): via INTRAVENOUS

## 2014-07-13 MED ORDER — TRIHEXYPHENIDYL HCL 2 MG PO TABS
ORAL_TABLET | ORAL | Status: AC
  Filled 2014-07-13: qty 3

## 2014-07-13 MED ORDER — CETYLPYRIDINIUM CHLORIDE 0.05 % MT LIQD
7.0000 mL | Freq: Two times a day (BID) | OROMUCOSAL | Status: DC
  Administered 2014-07-13 – 2014-07-16 (×5): 7 mL via OROMUCOSAL

## 2014-07-13 MED ORDER — TRIHEXYPHENIDYL HCL 5 MG PO TABS
5.0000 mg | ORAL_TABLET | Freq: Two times a day (BID) | ORAL | Status: DC
  Administered 2014-07-13 – 2014-07-16 (×6): 5 mg via ORAL
  Filled 2014-07-13 (×8): qty 1

## 2014-07-13 MED ORDER — INSULIN ASPART 100 UNIT/ML ~~LOC~~ SOLN
0.0000 [IU] | Freq: Three times a day (TID) | SUBCUTANEOUS | Status: DC
  Administered 2014-07-14 – 2014-07-15 (×2): 2 [IU] via SUBCUTANEOUS
  Administered 2014-07-16: 1 [IU] via SUBCUTANEOUS

## 2014-07-13 MED ORDER — LABETALOL HCL 200 MG PO TABS
200.0000 mg | ORAL_TABLET | Freq: Two times a day (BID) | ORAL | Status: DC
  Administered 2014-07-13 – 2014-07-16 (×6): 200 mg via ORAL
  Filled 2014-07-13 (×6): qty 1

## 2014-07-13 MED ORDER — SIMVASTATIN 20 MG PO TABS
20.0000 mg | ORAL_TABLET | Freq: Every day | ORAL | Status: DC
  Administered 2014-07-14 – 2014-07-15 (×2): 20 mg via ORAL
  Filled 2014-07-13 (×2): qty 1

## 2014-07-13 MED ORDER — ASPIRIN 325 MG PO TABS: 325.0000 mg | ORAL_TABLET | Freq: Every day | ORAL | Status: DC

## 2014-07-13 MED ORDER — SODIUM CHLORIDE 0.9 % IV SOLN
Freq: Once | INTRAVENOUS | Status: AC
  Administered 2014-07-13: 18:00:00 via INTRAVENOUS

## 2014-07-13 MED ORDER — SODIUM CHLORIDE 0.9 % IV SOLN
Freq: Once | INTRAVENOUS | Status: AC
  Administered 2014-07-13: 17:00:00 via INTRAVENOUS

## 2014-07-13 MED ORDER — SIMVASTATIN 20 MG PO TABS: 20.0000 mg | ORAL_TABLET | Freq: Every day | ORAL | Status: DC

## 2014-07-13 MED ORDER — CLONIDINE HCL 0.2 MG PO TABS: 0.3000 mg | ORAL_TABLET | Freq: Every day | ORAL | Status: DC

## 2014-07-13 MED ORDER — RAMIPRIL 5 MG PO CAPS
10.0000 mg | ORAL_CAPSULE | Freq: Every day | ORAL | Status: DC
  Administered 2014-07-14 – 2014-07-16 (×3): 10 mg via ORAL
  Filled 2014-07-13 (×3): qty 2

## 2014-07-13 MED ORDER — PIPERACILLIN-TAZOBACTAM 3.375 G IVPB
3.3750 g | Freq: Once | INTRAVENOUS | Status: AC
  Administered 2014-07-13: 3.375 g via INTRAVENOUS
  Filled 2014-07-13: qty 50

## 2014-07-13 MED ORDER — DEXTROSE 5 % IV SOLN: 500.0000 mg | INTRAVENOUS | Status: DC

## 2014-07-13 NOTE — Plan of Care (Signed)
Problem: Phase I Progression Outcomes Goal: Initial discharge plan identified Outcome: Completed/Met Date Met:  07/13/14     

## 2014-07-13 NOTE — ED Notes (Signed)
Emptied foley bag of 775 ml of urine.

## 2014-07-13 NOTE — ED Notes (Signed)
Patient thrashing head and body back and forth stating "No no no."

## 2014-07-13 NOTE — ED Provider Notes (Signed)
CSN: [id number]     Arrival date & time 07/13/14  1403 History  This chart was scribed for Maudry Diego, MD by Chester Holstein, ED Scribe. This patient was seen in room APA14/[facility]14 and the patient's care was started at 2:11 PM.     Chief Complaint  Patient presents with  . Altered Mental Status    Patient is a 58 y.o. male presenting with altered mental status. The history is provided by the nursing home and a relative. The history is limited by the condition of the patient. No language interpreter was used.  Altered Mental Status Presenting symptoms: confusion, disorientation, lethargy and partial responsiveness   Severity:  Unable to specify Most recent episode:  Today Episode history:  Unable to specify Timing:  Constant Progression:  Unchanged Chronicity:  New Context: nursing home resident   Context: not homeless     LEVEL 5 CAVEAT- ALTERED MENTAL STATUS HPI Comments: Derrick Butler is a 58 y.o. male who presents to the Emergency Department complaining of AMS. Nursing staff indicates pt was found on the floor at [place]. Pt was unresponsive but and not febrile.  States that pt usually has limited communication and has tremors at baseline.    Past Medical History  Diagnosis Date  . Aortic dissection   . Hypertension   . CVA (cerebral infarction)   . Aortic dissection   . Hypertension   . CVA (cerebral infarction)   . Diabetes mellitus   . Cardiomyopathy   . Hyperglycemia   . Bipolar 1 disorder   . Hyperkalemia   . Coronary artery disease   . Hyponatremia   . Altered mental status     with psychosis  . Dehydration   . TIA (transient ischemic attack)   . Lack of coordination   . Cardiomyopathy    Past Surgical History  Procedure Laterality Date  . Ascending aortic aneurysm repair     Family History  Problem Relation Age of Onset  . Alzheimer's disease Mother   . Stroke Father   . Heart disease Father   . Bipolar disorder Father   . Bipolar disorder  Sister   . Bipolar disorder Sister    History  Substance Use Topics  . Smoking status: Never Smoker   . Smokeless tobacco: Not on file  . Alcohol Use: No    Review of Systems  Unable to perform ROS: Mental status change  Neurological: Positive for tremors (baseline).  Psychiatric/Behavioral: Positive for confusion.      Allergies  Review of patient's allergies indicates no known allergies.  Home Medications   Prior to Admission medications   Medication Sig Start Date End Date Taking? Authorizing Provider  aspirin 325 MG tablet Take 325 mg by mouth daily.      Historical Provider, MD  baclofen (LIORESAL) 10 MG tablet Take 10 mg by mouth 3 (three) times daily.    Historical Provider, MD  benztropine (COGENTIN) 1 MG tablet Take 1 mg by mouth 3 (three) times daily.    Historical Provider, MD  cloNIDine (CATAPRES) 0.3 MG tablet Take 0.3 mg by mouth daily.      Historical Provider, MD  divalproex (DEPAKOTE) 250 MG DR tablet Take 250 mg by mouth 3 (three) times daily.     Historical Provider, MD  doxycycline (VIBRA-TABS) 100 MG tablet Take 1 tablet (100 mg total) by mouth every 12 (twelve) hours. 09/11/13   Alonza Bogus, MD  feeding supplement, RESOURCE BREEZE, (RESOURCE BREEZE) LIQD Take 1  Container by mouth 3 (three) times daily between meals.    Historical Provider, MD  fluticasone (FLONASE) 50 MCG/ACT nasal spray Place 2 sprays into the nose daily.    Historical Provider, MD  glyBURIDE (DIABETA) 5 MG tablet Take 5 mg by mouth daily with breakfast.      Historical Provider, MD  labetalol (NORMODYNE) 200 MG tablet Take 200 mg by mouth 2 (two) times daily.      Historical Provider, MD  potassium chloride (KLOR-CON) 20 MEQ packet Take 20 mEq by mouth daily.      Historical Provider, MD  ramipril (ALTACE) 10 MG tablet Take 10 mg by mouth daily.      Historical Provider, MD  risperiDONE (RISPERDAL) 1 MG tablet Take 1 tablet (1 mg total) by mouth 3 (three) times daily. 06/05/13 06/05/14   [name], MD  simvastatin (ZOCOR) 40 MG tablet Take 40 mg by mouth at bedtime.      Historical Provider, MD  temazepam (RESTORIL) 15 MG capsule Take 15 mg by mouth at bedtime.      Historical Provider, MD   There were no vitals taken for this visit. Physical Exam  Constitutional: He appears well-developed.  Extremely lethargic  HENT:  Head: Normocephalic.  Eyes: Conjunctivae and EOM are normal. No scleral icterus.  Neck: Neck supple. No thyromegaly present.  Cardiovascular: Normal rate and regular rhythm.  Exam reveals no gallop and no friction rub.   No murmur heard. Pulmonary/Chest: No stridor. He has no wheezes. He has no rales. He exhibits no tenderness.  Abdominal: He exhibits no distension. There is no tenderness. There is no rebound.  Musculoskeletal: He exhibits no edema.  Lymphadenopathy:    He has no cervical adenopathy.  Neurological: He exhibits normal muscle tone. Coordination normal.  oriented to person only responsive to painful stimuli  Skin: No rash noted. No erythema.  Nursing note and vitals reviewed.   ED Course  Procedures (including critical care time) Labs Review Labs Reviewed - No data to display  DIAGNOSTIC STUDIES: Oxygen Saturation is 100% on room air, normal by my interpretation.    COORDINATION OF CARE: 2:18 PM Discussed treatment plan with patient at beside, the patient agrees with the plan and has no further questions at this time.    Imaging Review No results found.   EKG Interpretation   Date/Time:  Monday July 13 2014 14:11:25 EST Ventricular Rate:  62 PR Interval:  218 QRS Duration: 111 QT Interval:  443 QTC Calculation: 450 R Axis:   -13 Text Interpretation:  Sinus rhythm Prolonged PR interval Confirmed by  Aldrick Derrig  MD, [place] [phone]) on 07/13/2014 4:29:17 PM     CRITICAL CARE Performed by: Jahnaya Branscome L Total critical care time: 40 Critical care time was exclusive of separately billable procedures and treating other  patients. Critical care was necessary to treat or prevent imminent or life-threatening deterioration. Critical care was time spent personally by me on the following activities: development of treatment plan with patient and/or surrogate as well as nursing, discussions with consultants, evaluation of patient's response to treatment, examination of patient, obtaining history from patient or surrogate, ordering and performing treatments and interventions, ordering and review of laboratory studies, ordering and review of radiographic studies, pulse oximetry and re-evaluation of patient's condition.  MDM   Final diagnoses:  None    The chart was scribed for me under my direct supervision.  I personally performed the history, physical, and medical decision making and all procedures in the evaluation  of this patient.Maudry Diego, MD 07/13/14 [phone]

## 2014-07-13 NOTE — Progress Notes (Signed)
ANTIBIOTIC CONSULT NOTE  Pharmacy Consult for Vancomycin & Zosyn Indication: pneumonia  No Known Allergies  Patient Measurements: Height: 5\' 7"  (170.2 cm) Weight: 152 lb 12.5 oz (69.3 kg) IBW/kg (Calculated) : 66.1  Vital Signs: Temp: 97.5 F (36.4 C) (11/09 1524) Temp Source: Rectal (11/09 1524) BP: 124/57 mmHg (11/09 1800) Pulse Rate: 63 (11/09 1730) Intake/Output from previous day:   Intake/Output from this shift:    Labs:  Recent Labs  07/13/14 1421  WBC 11.0*  HGB 12.5*  PLT 86*  CREATININE 1.75*   Estimated Creatinine Clearance: 43.5 mL/min (by C-G formula based on Cr of 1.75). No results for input(s): VANCOTROUGH, VANCOPEAK, VANCORANDOM, GENTTROUGH, GENTPEAK, GENTRANDOM, TOBRATROUGH, TOBRAPEAK, TOBRARND, AMIKACINPEAK, AMIKACINTROU, AMIKACIN in the last 72 hours.   Microbiology: Recent Results (from the past 720 hour(s))  Blood culture (routine x 2)     Status: None (Preliminary result)   Collection Time: 07/13/14  3:30 PM  Result Value Ref Range Status   Specimen Description BLOOD RIGHT ARM  Final   Special Requests BOTTLES DRAWN AEROBIC AND ANAEROBIC 8 CC EACH  Final   Culture PENDING  Incomplete   Report Status PENDING  Incomplete  Blood culture (routine x 2)     Status: None (Preliminary result)   Collection Time: 07/13/14  3:33 PM  Result Value Ref Range Status   Specimen Description BLOOD LEFT HAND  Final   Special Requests BOTTLES DRAWN AEROBIC ONLY 6CC  Final   Culture PENDING  Incomplete   Report Status PENDING  Incomplete    Anti-infectives    Start     Dose/Rate Route Frequency Ordered Stop   07/13/14 1830  cefTRIAXone (ROCEPHIN) 1 g in dextrose 5 % 50 mL IVPB  Status:  Discontinued     1 g100 mL/hr over 30 Minutes Intravenous Every 24 hours 07/13/14 1829 07/13/14 1832   07/13/14 1830  azithromycin (ZITHROMAX) 500 mg in dextrose 5 % 250 mL IVPB  Status:  Discontinued     500 mg250 mL/hr over 60 Minutes Intravenous Every 24 hours 07/13/14  1829 07/13/14 1832   07/13/14 1515  vancomycin (VANCOCIN) IVPB 1000 mg/200 mL premix     1,000 mg200 mL/hr over 60 Minutes Intravenous  Once 07/13/14 1512 07/13/14 1737   07/13/14 1515  piperacillin-tazobactam (ZOSYN) IVPB 3.375 g     3.375 g12.5 mL/hr over 240 Minutes Intravenous  Once 07/13/14 1512 07/13/14 1659      Assessment: 58 yo M admitted from NH with altered mental status.   He is afebrile with mildly elevated WBC.  Lactic acid level elevated.  CXR + PNA.  Scr is elevated above patient's baseline.   Vancomycin 11/9>> Zosyn 11/9>>  Goal of Therapy:  Vancomycin trough level 15-20 mcg/ml  Plan:  Zosyn 3.375gm IV Q8h to be infused over 4hrs Vancomycin 1250mg  IV q24h Check Vancomycin trough at steady state Monitor renal function and cx data   Biagio Borg 07/13/2014,7:22 PM

## 2014-07-13 NOTE — H&P (Signed)
[facility] History and Physical  Derrick Butler LJQ:[id number] DOB: 1955-09-22 DOA: 07/13/2014  Referring physician: ER PCP: Alonza Bogus, MD   Chief Complaint: altered mental status  HPI: Derrick Butler is a 58 y.o. male  This is a 58 year old man, who has diabetes, and who also has cognitive deficits, possibly from previous strokes, who cannot really give me his own history. He apparently came to the emergency room, having been found on the floor at [place] home. He was unresponsive but not febrile. The patient usually has limited communication and has tremors at baseline. He remained apparently unresponsive for a couple of hours but now has become more awake. Evaluation in emergency room shows a probable right upper lobe pneumonia. He is now being written for further management.   Review of Systems:  Constitutional:  No weight loss, night sweats, Fevers, chills, fatigue.  HEENT:  No headaches, Difficulty swallowing,Tooth/dental problems,Sore throat,  No sneezing, itching, ear ache, nasal congestion, post nasal drip,  Cardio-vascular:  No chest pain, Orthopnea, PND, swelling in lower extremities, anasarca, dizziness, palpitations  GI:  No heartburn, indigestion, abdominal pain, nausea, vomiting, diarrhea, change in bowel habits, loss of appetite   Skin:  no rash or lesions.  GU:  no dysuria, change in color of urine, no urgency or frequency. No flank pain.  Musculoskeletal:  No joint pain or swelling. No decreased range of motion. No back pain.  Psych:  No change in mood or affect. No depression or anxiety. No memory loss.   Past Medical History  Diagnosis Date  . Aortic dissection   . Hypertension   . CVA (cerebral infarction)   . Aortic dissection   . Hypertension   . CVA (cerebral infarction)   . Diabetes mellitus   . Cardiomyopathy   . Hyperglycemia   . Bipolar 1 disorder   . Hyperkalemia   . Coronary artery disease   . Hyponatremia   .  Altered mental status     with psychosis  . Dehydration   . TIA (transient ischemic attack)   . Lack of coordination   . Cardiomyopathy    Past Surgical History  Procedure Laterality Date  . Ascending aortic aneurysm repair     Social History:  reports that he has never smoked. He does not have any smokeless tobacco history on file. He reports that he does not drink alcohol or use illicit drugs.  No Known Allergies  Family History  Problem Relation Age of Onset  . Alzheimer's disease Mother   . Stroke Father   . Heart disease Father   . Bipolar disorder Father   . Bipolar disorder Sister   . Bipolar disorder Sister      Prior to Admission medications   Medication Sig Start Date End Date Taking? Authorizing Provider  aspirin 325 MG tablet Take 325 mg by mouth daily.     Yes Historical Provider, MD  baclofen (LIORESAL) 10 MG tablet Take 10 mg by mouth 3 (three) times daily.   Yes Historical Provider, MD  feeding supplement, ENSURE COMPLETE, (ENSURE COMPLETE) LIQD Take 237 mLs by mouth at bedtime.   Yes Historical Provider, MD  fluticasone (FLONASE) 50 MCG/ACT nasal spray Place 2 sprays into the nose daily as needed. For allergies   Yes Historical Provider, MD  labetalol (NORMODYNE) 200 MG tablet Take 200 mg by mouth 2 (two) times daily.     Yes Historical Provider, MD  NON FORMULARY Take by mouth 2 (two) times daily. MAGIC  CUP   Yes Historical Provider, MD  QUEtiapine (SEROQUEL) 200 MG tablet Take 200 mg by mouth 2 (two) times daily.   Yes Historical Provider, MD  ramipril (ALTACE) 10 MG tablet Take 10 mg by mouth daily.     Yes Historical Provider, MD  simvastatin (ZOCOR) 20 MG tablet Take 20 mg by mouth daily.   Yes Historical Provider, MD  temazepam (RESTORIL) 15 MG capsule Take 15 mg by mouth at bedtime.     Yes Historical Provider, MD  trihexyphenidyl (ARTANE) 5 MG tablet Take 5 mg by mouth 2 (two) times daily with a meal.   Yes Historical Provider, MD  benztropine (COGENTIN)  1 MG tablet Take 1 mg by mouth 3 (three) times daily.    Historical Provider, MD  cloNIDine (CATAPRES) 0.3 MG tablet Take 0.3 mg by mouth daily.      Historical Provider, MD  divalproex (DEPAKOTE) 250 MG DR tablet Take 250 mg by mouth 3 (three) times daily.     Historical Provider, MD  doxycycline (VIBRA-TABS) 100 MG tablet Take 1 tablet (100 mg total) by mouth every 12 (twelve) hours. Patient not taking: Reported on 07/13/2014 09/11/13   Alonza Bogus, MD  feeding supplement, RESOURCE BREEZE, (RESOURCE BREEZE) LIQD Take 1 Container by mouth 3 (three) times daily between meals.    Historical Provider, MD  glyBURIDE (DIABETA) 5 MG tablet Take 5 mg by mouth daily with breakfast.      Historical Provider, MD  potassium chloride (KLOR-CON) 20 MEQ packet Take 20 mEq by mouth daily.      Historical Provider, MD  risperiDONE (RISPERDAL) 1 MG tablet Take 1 tablet (1 mg total) by mouth 3 (three) times daily. 06/05/13 06/05/14  [name], MD  simvastatin (ZOCOR) 40 MG tablet Take 40 mg by mouth at bedtime.      Historical Provider, MD   Physical Exam: Filed Vitals:   07/13/14 1630 07/13/14 1700 07/13/14 1730 07/13/14 1800  BP: 120/90 82/57 106/67 124/57  Pulse: 65  63   Temp:      TempSrc:      Resp: 30 22 12 31   SpO2: 99%  99%     Wt Readings from Last 3 Encounters:  09/07/13 56.8 kg (125 lb 3.5 oz)  08/18/13 76.204 kg (168 lb)  06/17/13 76.3 kg (168 lb 3.4 oz)    General:  Appears calm and comfortable. He is not in any respiratory distress. Has no peripheral central cyanosis. He is not febrile. He does appear somewhat dehydrated. Eyes: PERRL, normal lids, irises & conjunctiva ENT: grossly normal hearing, lips & tongue Neck: no LAD, masses or thyromegaly Cardiovascular: RRR, no m/r/g. No LE edema. Telemetry: SR, no arrhythmias  Respiratory: CTA bilaterally, no w/r/r. Normal respiratory effort. Abdomen: soft, ntnd Skin: no rash or induration seen on limited exam Musculoskeletal: grossly  normal tone BUE/BLE  Neurologic: abnormal involuntary movements, these are probably his baseline and may be related to medication effects based on his drug history.          Labs on Admission:  Basic Metabolic Panel:  Recent Labs Lab 07/13/14 1421  NA 138  K 4.6  CL 103  CO2 23  GLUCOSE 144*  BUN 33*  CREATININE 1.75*  CALCIUM 9.4   Liver Function Tests:  Recent Labs Lab 07/13/14 1421  AST 13  ALT 16  ALKPHOS 79  BILITOT 0.4  PROT 6.6  ALBUMIN 3.5   No results for input(s): LIPASE, AMYLASE in the last 168  hours.  Recent Labs Lab 07/13/14 1421  AMMONIA 21   CBC:  Recent Labs Lab 07/13/14 1421  WBC 11.0*  NEUTROABS 8.3*  HGB 12.5*  HCT 36.3*  MCV 89.2  PLT 86*   Cardiac Enzymes:  Recent Labs Lab 07/13/14 1421  TROPONINI <0.30    BNP (last 3 results) No results for input(s): PROBNP in the last 8760 hours. CBG: No results for input(s): GLUCAP in the last 168 hours.  Radiological Exams on Admission: Ct Head Wo Contrast  07/13/2014   CLINICAL DATA:  Altered mental status. First attempt Pt uncooperative, became combative. Second attempt pt was still somewhat combative and unwilling to hold still. Immobilization was utilized. ED DR. Wanted test to be done regardless of outcome  EXAM: CT HEAD WITHOUT CONTRAST  TECHNIQUE: Contiguous axial images were obtained from the base of the skull through the vertex without intravenous contrast.  COMPARISON:  09/06/2013 and 06/14/2013  FINDINGS: Multiple attempts made at imaging as patient was immobilized but remain combative as there is moderate motion artifact. Ventricles, cisterns and CSF spaces are within normal. 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 No definite mass, mass effect or midline shift. No obvious acute hemorrhage noted. No definite acute infarction. Old lacuner infarct over the left basal ganglia unchanged. Remainder the exam is unremarkable.   IMPRESSION: Limited exam due to moderate patient motion without acute intracranial finding.  Old infarcts as described.   Electronically Signed   By: Marin Olp M.D.   On: 07/13/2014 16:29   Dg Chest Portable 1 View  07/13/2014   CLINICAL DATA:  Altered mental status. The patient is a unresponsive.  EXAM: PORTABLE CHEST - 1 VIEW  COMPARISON:  August 19, 2013  FINDINGS: The heart size and mediastinal contours are stable. The heart size is enlarged. The aorta is tortuous. Patient is status post prior median sternotomy. There is patchy consolidation of the right upper lobe. The left lung is clear. There is no pleural effusion. The visualized skeletal structures are stable P  IMPRESSION: Right upper lobe pneumonia.   Electronically Signed   By: Abelardo Diesel M.D.   On: 07/13/2014 14:51      Assessment/Plan   1. Healthcare associated right upper lobe pneumonia. 2. Hypertension. 3. Cognitive deficits. 4. Bipolar disorder. 5. Dehydration. 6. Diabetes.  Plan: 1. Admit. 2. Intravenous antibiotics. 3. Intravenous fluids. 4. Monitor and control diabetes.  Further recommendations will depend on patient's hospital progress.   Code Status: full code.   DVT Prophylaxis:SCDs.  Family Communication: no family members present at the bedside.   Disposition Plan: back to the skilled nursing facility when medically stable.  Time spent: 60 minutes.  Doree Albee [facility] Pager [phone].

## 2014-07-13 NOTE — ED Notes (Addendum)
Resident at [facility] [place] and found unresponsive in floor.  EMS brought pt to er.  Unresponsive with EMS> CBG 206.  But when rain hit pt he sit up on stretcher and looked around.  At this time pt still unresponsive.  Having tremors which is patients baseline.

## 2014-07-14 DIAGNOSIS — D696 Thrombocytopenia, unspecified: Secondary | ICD-10-CM | POA: Diagnosis present

## 2014-07-14 LAB — GLUCOSE, CAPILLARY
GLUCOSE-CAPILLARY: 100 mg/dL — AB (ref 70–99)
GLUCOSE-CAPILLARY: 116 mg/dL — AB (ref 70–99)
Glucose-Capillary: 159 mg/dL — ABNORMAL HIGH (ref 70–99)
Glucose-Capillary: 220 mg/dL — ABNORMAL HIGH (ref 70–99)

## 2014-07-14 LAB — COMPREHENSIVE METABOLIC PANEL
ALBUMIN: 3 g/dL — AB (ref 3.5–5.2)
ALT: 13 U/L (ref 0–53)
ANION GAP: 12 (ref 5–15)
AST: 12 U/L (ref 0–37)
Alkaline Phosphatase: 68 U/L (ref 39–117)
BILIRUBIN TOTAL: 0.7 mg/dL (ref 0.3–1.2)
BUN: 24 mg/dL — AB (ref 6–23)
CALCIUM: 8.8 mg/dL (ref 8.4–10.5)
CO2: 22 mEq/L (ref 19–32)
CREATININE: 1.55 mg/dL — AB (ref 0.50–1.35)
Chloride: 108 mEq/L (ref 96–112)
GFR calc Af Amer: 56 mL/min — ABNORMAL LOW (ref >90)
GFR, EST NON AFRICAN AMERICAN: 48 mL/min — AB (ref >90)
Glucose, Bld: 139 mg/dL — ABNORMAL HIGH (ref 70–99)
Potassium: 4.2 mEq/L (ref 3.7–5.3)
Sodium: 142 mEq/L (ref 137–147)
Total Protein: 5.9 g/dL — ABNORMAL LOW (ref 6.0–8.3)

## 2014-07-14 LAB — CBC
HEMATOCRIT: 33.2 % — AB (ref 39.0–52.0)
HEMOGLOBIN: 11.4 g/dL — AB (ref 13.0–17.0)
MCH: 30.1 pg (ref 26.0–34.0)
MCHC: 34.3 g/dL (ref 30.0–36.0)
MCV: 87.6 fL (ref 78.0–100.0)
Platelets: 74 10*3/uL — ABNORMAL LOW (ref 150–400)
RBC: 3.79 MIL/uL — ABNORMAL LOW (ref 4.22–5.81)
RDW: 12.1 % (ref 11.5–15.5)
WBC: 7.3 10*3/uL (ref 4.0–10.5)

## 2014-07-14 LAB — STREP PNEUMONIAE URINARY ANTIGEN: STREP PNEUMO URINARY ANTIGEN: NEGATIVE

## 2014-07-14 NOTE — Clinical Social Work Psychosocial (Signed)
Clinical Social Work Department BRIEF PSYCHOSOCIAL ASSESSMENT 07/14/2014  Patient:  Derrick Butler, Derrick Butler     Account Number:  [id number]     Admit date:  07/13/2014  Clinical Social Worker:  Legrand Como  Date/Time:  07/14/2014 12:41 PM  Referred by:  CSW  Date Referred:  07/14/2014 Referred for  SNF Placement   Other Referral:   Interview type:  Patient Other interview type:   Patient  Ivin Booty @ [facility]  [name], friend and POA    PSYCHOSOCIAL DATA Living Status:  FACILITY Admitted from facility:  [facility] Level of care:  [place] Primary support name:  [name] Primary support relationship to patient:  FRIEND Degree of support available:   Ms. Sandi Mariscal is very supportive    CURRENT CONCERNS Current Concerns  Post-Acute Placement   Other Concerns:    SOCIAL WORK ASSESSMENT / PLAN CSW met with patient. Patient was oriented to self and situation only.  Patient had issues with speech and tremors which made interviewing difficult.  Patient denied that he was a resident at [facility] and indicated that he lived on [address]  CSW spoke with Ivin Booty at [facility]. She indicated that patient has been a resident at [facility] for a couple years.  She stated that he requires extensive assistance on all ADLs and that he uses a wheelchair. Ivin Booty indicated that patient does not have any family, but he has a very close friend, [name] who is like family. She reported that Ms. Sandi Mariscal visits patient often and "will do anything for him."  Ivin Booty stated that patient can return to the facility upon discharge.  CSW spoke to [employer]. Ms. Sandi Mariscal confirmed Sharon's statements. She indicated that she has been patient's POA for 15 years.  Ms. Sandi Mariscal indicated that she would like for patient to return to [facility] upon discharge.   Assessment/plan status:  Information/Referral to [employer] Other assessment/ plan:   Information/referral to community resources:   [facility]     PATIENT'S/FAMILY'S RESPONSE TO PLAN OF CARE: Patient's POA is agreeable to patient returning to [facility] upon discharge.   Ambrose Pancoast, [place]

## 2014-07-14 NOTE — Clinical Documentation Improvement (Signed)
Possible Clinical Conditions?   Septicemia / Sepsis Severe Sepsis Septic Shock Other Condition  Cannot clinically Determine   Supporting Information:(As per H&P) BP: 82/57 P:63 Resp:30  WBC=11 Infection source: Pneumonia Diagnostics: CXR 07-13-14: IMPRESSION: Right upper lobe pneumonia.    Thank You, Alessandra Grout, RN, BSN, CCDS,Clinical Documentation Specialist:  [phone]  [phone]=[facility]- Health Information Management

## 2014-07-14 NOTE — Plan of Care (Signed)
Problem: Phase I Progression Outcomes Goal: Hemodynamically stable Outcome: Progressing     

## 2014-07-14 NOTE — Progress Notes (Signed)
[facility] PROGRESS NOTE  MARKEE MATERA NUU:[id number] DOB: May 03, 1956 DOA: 07/13/2014 PCP: No primary care provider on file.  Assessment/Plan: 1. Healthcare associated pneumonia versus aspiration pneumonia. Patient is on appropriate antibiotic coverage with vancomycin and Zosyn. He does not have any fever or significant leukocytosis. We'll continue current antibiotics for 24 hours and likely de-escalate tomorrow. It was reported that he was found down at the nursing home for unknown period of time. Circumstances of him being unresponsive are not entirely clear. Will check EEG to rule out any underlying seizures. Monitor on telemetry. 2. Chronic kidney disease stage III. Creatinine appears to be at baseline. 3. 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 4. Bipolar disorder. Continue outpatient regimen. 5. Cognitive deficits 6. Diabetes. Sliding scale insulin. 7. Hypertension. Continue outpatient regimen 8. Thrombocytopenia. Appears to be somewhat of a chronic issue, but worse during this admission. This may be related to his underlying infection and should hopefully improve with treatment. We'll continue to monitor.  Code Status: Full code Family Communication: no family present Disposition Plan: discharge back to [facility] when stable   Consultants:    Procedures:    Antibiotics:  Vancomycin 11/9>>  Zosyn 11/9>>  HPI/Subjective: Denies any shortness of breath or cough, confused  Objective: Filed Vitals:   07/14/14 0809  BP:   Pulse:   Temp: 98.6 F (37 C)  Resp:     Intake/Output Summary (Last 24 hours) at 07/14/14 1024 Last data filed at 07/14/14 0751  Gross per 24 hour  Intake    100 ml  Output    900 ml  Net   -800 ml   Filed Weights   07/13/14 1843 07/14/14 0500  Weight: 69.3 kg (152 lb 12.5 oz) 69.2 kg (152 lb 8.9 oz)    Exam:   General:  NAD  Cardiovascular: S1, S2 RRR  Respiratory: CTA B  Abdomen: soft, nt, nd,  bs+  Musculoskeletal: no edema b/l   Data Reviewed: Basic Metabolic Panel:  Recent Labs Lab 07/13/14 1421 07/14/14 0505  NA 138 142  K 4.6 4.2  CL 103 108  CO2 23 22  GLUCOSE 144* 139*  BUN 33* 24*  CREATININE 1.75* 1.55*  CALCIUM 9.4 8.8   Liver Function Tests:  Recent Labs Lab 07/13/14 1421 07/14/14 0505  AST 13 12  ALT 16 13  ALKPHOS 79 68  BILITOT 0.4 0.7  PROT 6.6 5.9*  ALBUMIN 3.5 3.0*   No results for input(s): LIPASE, AMYLASE in the last 168 hours.  Recent Labs Lab 07/13/14 1421  AMMONIA 21   CBC:  Recent Labs Lab 07/13/14 1421 07/14/14 0505  WBC 11.0* 7.3  NEUTROABS 8.3*  --   HGB 12.5* 11.4*  HCT 36.3* 33.2*  MCV 89.2 87.6  PLT 86* 74*   Cardiac Enzymes:  Recent Labs Lab 07/13/14 1421  TROPONINI <0.30   BNP (last 3 results) No results for input(s): PROBNP in the last 8760 hours. CBG:  Recent Labs Lab 07/13/14 2113 07/14/14 0754  GLUCAP 222* 159*    Recent Results (from the past 240 hour(s))  Blood culture (routine x 2)     Status: None (Preliminary result)   Collection Time: 07/13/14  3:30 PM  Result Value Ref Range Status   Specimen Description BLOOD RIGHT ARM  Final   Special Requests BOTTLES DRAWN AEROBIC AND ANAEROBIC 8 CC EACH  Final   Culture PENDING  Incomplete   Report Status PENDING  Incomplete  Blood culture (routine x 2)  Status: None (Preliminary result)   Collection Time: 07/13/14  3:33 PM  Result Value Ref Range Status   Specimen Description BLOOD LEFT HAND  Final   Special Requests BOTTLES DRAWN AEROBIC ONLY [place]  Final   Culture PENDING  Incomplete   Report Status PENDING  Incomplete  MRSA PCR Screening     Status: Abnormal   Collection Time: 07/13/14  6:47 PM  Result Value Ref Range Status   MRSA by PCR POSITIVE (A) NEGATIVE Final    Comment:        The GeneXpert MRSA Assay (FDA approved for NASAL specimens only), is one component of a comprehensive MRSA colonization surveillance program. It is  not intended to diagnose MRSA infection nor to guide or monitor treatment for MRSA infections. RESULT CALLED TO, READ BACK BY AND VERIFIED WITH: WAGONER R AT 2259 ON [id number] BY FORSYTH K      Studies: Ct Head Wo Contrast  07/13/2014   CLINICAL DATA:  Altered mental status. First attempt Pt uncooperative, became combative. Second attempt pt was still somewhat combative and unwilling to hold still. Immobilization was utilized. ED DR. Wanted test to be done regardless of outcome  EXAM: CT HEAD WITHOUT CONTRAST  TECHNIQUE: Contiguous axial images were obtained from the base of the skull through the vertex without intravenous contrast.  COMPARISON:  09/06/2013 and 06/14/2013  FINDINGS: Multiple attempts made at imaging as patient was immobilized but remain combative as there is moderate motion artifact. Ventricles, cisterns and CSF spaces are within normal. 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 No definite mass, mass effect or midline shift. No obvious acute hemorrhage noted. No definite acute infarction. Old lacuner infarct over the left basal ganglia unchanged. Remainder the exam is unremarkable.  IMPRESSION: Limited exam due to moderate patient motion without acute intracranial finding.  Old infarcts as described.   Electronically Signed   By: Marin Olp M.D.   On: 07/13/2014 16:29   Dg Chest Portable 1 View  07/13/2014   CLINICAL DATA:  Altered mental status. The patient is a unresponsive.  EXAM: PORTABLE CHEST - 1 VIEW  COMPARISON:  August 19, 2013  FINDINGS: The heart size and mediastinal contours are stable. The heart size is enlarged. The aorta is tortuous. Patient is status post prior median sternotomy. There is patchy consolidation of the right upper lobe. The left lung is clear. There is no pleural effusion. The visualized skeletal structures are stable P  IMPRESSION: Right upper lobe pneumonia.   Electronically Signed   By: Abelardo Diesel  M.D.   On: 07/13/2014 14:51    Scheduled Meds: . antiseptic oral rinse  7 mL Mouth Rinse BID  . aspirin  325 mg Oral Daily  . baclofen  10 mg Oral TID  . Chlorhexidine Gluconate Cloth  6 each Topical Q0600  . cloNIDine  0.3 mg Oral Daily  . Influenza vac split quadrivalent PF  0.5 mL Intramuscular Tomorrow-1000  . insulin aspart  0-5 Units Subcutaneous QHS  . insulin aspart  0-9 Units Subcutaneous TID WC  . labetalol  200 mg Oral BID  . mupirocin ointment  1 application Nasal BID  . piperacillin-tazobactam (ZOSYN)  IV  3.375 g Intravenous Q8H  . QUEtiapine  200 mg Oral BID  . ramipril  10 mg Oral Daily  . risperiDONE  1 mg Oral TID  . simvastatin  20 mg Oral q1800  . temazepam  15 mg Oral QHS  . trihexyphenidyl  5 mg Oral BID WC  . vancomycin  1,250 mg Intravenous Q24H   Continuous Infusions: . sodium chloride 100 mL/hr at 07/14/14 1859    Active Problems:   Essential hypertension   Bipolar I disorder, most recent episode mixed   Altered mental status   CVA, old, cognitive deficits   CAP (community acquired pneumonia)   Thrombocytopenia    Time spent: 19mins    MEMON,JEHANZEB  [facility] Pager [phone]. If 7PM-7AM, please contact night-coverage at [email or web address], password [facility] 07/14/2014, 10:24 AM  LOS: 1 day

## 2014-07-14 NOTE — Care Management Utilization Note (Signed)
UR review complete.  

## 2014-07-14 NOTE — Progress Notes (Signed)
Called report to Stann Mainland, RN on dept 300.  Verbalized understanding.  Pt transferred to floor in safe and stable condition. Schonewitz, Eulis Canner 07/14/2014

## 2014-07-14 NOTE — Care Management Note (Addendum)
    Page 1 of 1   07/16/2014     1:05:07 PM CARE MANAGEMENT NOTE 07/16/2014  Patient:  MAXIMINO, COZZOLINO   Account Number:  [id number]  Date Initiated:  07/14/2014  Documentation initiated by:  Jolene Provost  Subjective/Objective Assessment:   Pt is from SNF.     Action/Plan:   No CM needs at this time. Will continue to follow for CM needs.   Anticipated DC Date:  07/16/2014   Anticipated DC Plan:  SKILLED NURSING FACILITY  In-house referral  Clinical Social Worker      DC Planning Services  CM consult      Choice offered to / List presented to:             Status of service:  Completed, signed off Medicare Important Message given?  YES (If response is "NO", the following Medicare IM given date fields will be blank) Date Medicare IM given:  07/16/2014 Medicare IM given by:  Theophilus Kinds Date Additional Medicare IM given:   Additional Medicare IM given by:    Discharge Disposition:  [place]  Per UR Regulation:    If discussed at Long Length of Stay Meetings, dates discussed:    Comments:  07/16/14 [place], RN BSN CM Anticipate discharge back to [facility] today. CSW to arrange discharge to facility.  07/14/2014 0830 Jolene Provost, RN, MSN, [facility]

## 2014-07-15 DIAGNOSIS — J189 Pneumonia, unspecified organism: Secondary | ICD-10-CM

## 2014-07-15 DIAGNOSIS — G9340 Encephalopathy, unspecified: Secondary | ICD-10-CM

## 2014-07-15 DIAGNOSIS — A419 Sepsis, unspecified organism: Secondary | ICD-10-CM

## 2014-07-15 DIAGNOSIS — N183 Chronic kidney disease, stage 3 (moderate): Secondary | ICD-10-CM

## 2014-07-15 LAB — GLUCOSE, CAPILLARY
GLUCOSE-CAPILLARY: 124 mg/dL — AB (ref 70–99)
Glucose-Capillary: 118 mg/dL — ABNORMAL HIGH (ref 70–99)
Glucose-Capillary: 154 mg/dL — ABNORMAL HIGH (ref 70–99)
Glucose-Capillary: 107 mg/dL — ABNORMAL HIGH (ref 70–99)

## 2014-07-15 LAB — LEGIONELLA ANTIGEN, URINE

## 2014-07-15 LAB — CBC
HCT: 32.8 % — ABNORMAL LOW (ref 39.0–52.0)
Hemoglobin: 11.3 g/dL — ABNORMAL LOW (ref 13.0–17.0)
MCH: 30.1 pg (ref 26.0–34.0)
MCHC: 34.5 g/dL (ref 30.0–36.0)
MCV: 87.5 fL (ref 78.0–100.0)
PLATELETS: 90 10*3/uL — AB (ref 150–400)
RBC: 3.75 MIL/uL — AB (ref 4.22–5.81)
RDW: 12 % (ref 11.5–15.5)
WBC: 5.9 10*3/uL (ref 4.0–10.5)

## 2014-07-15 LAB — BASIC METABOLIC PANEL
Anion gap: 13 (ref 5–15)
BUN: 20 mg/dL (ref 6–23)
CO2: 22 mEq/L (ref 19–32)
Calcium: 8.6 mg/dL (ref 8.4–10.5)
Chloride: 106 mEq/L (ref 96–112)
Creatinine, Ser: 1.61 mg/dL — ABNORMAL HIGH (ref 0.50–1.35)
GFR, EST AFRICAN AMERICAN: 53 mL/min — AB (ref >90)
GFR, EST NON AFRICAN AMERICAN: 46 mL/min — AB (ref >90)
Glucose, Bld: 208 mg/dL — ABNORMAL HIGH (ref 70–99)
POTASSIUM: 4.5 meq/L (ref 3.7–5.3)
SODIUM: 141 meq/L (ref 137–147)

## 2014-07-15 MED ORDER — AMOXICILLIN-POT CLAVULANATE 875-125 MG PO TABS
1.0000 | ORAL_TABLET | Freq: Two times a day (BID) | ORAL | Status: DC
  Administered 2014-07-16: 1 via ORAL
  Filled 2014-07-15: qty 1

## 2014-07-15 MED ORDER — AMOXICILLIN-POT CLAVULANATE 875-125 MG PO TABS: 1.0000 | ORAL_TABLET | Freq: Two times a day (BID) | ORAL | Status: DC

## 2014-07-15 MED ORDER — HALOPERIDOL LACTATE 5 MG/ML IJ SOLN
1.0000 mg | Freq: Once | INTRAMUSCULAR | Status: AC
  Administered 2014-07-15: 1 mg via INTRAVENOUS
  Filled 2014-07-15: qty 1

## 2014-07-15 NOTE — Progress Notes (Signed)
Patient climbed out of bed, over side rails and found standing at side of bed.  Patient was very unsteady. Patient assisted to bed but continues to try to get up, is combative, and refuses to get back to bed.  MD notified.

## 2014-07-15 NOTE — Plan of Care (Signed)
Problem: Phase I Progression Outcomes Goal: Voiding-avoid urinary catheter unless indicated Outcome: Completed/Met Date Met:  07/15/14     

## 2014-07-15 NOTE — Progress Notes (Signed)
PROGRESS NOTE  Derrick Butler QVZ:[id number] DOB: 09/13/1955 DOA: 07/13/2014 PCP: No primary care provider on file.  Summary: 58 year old man with cognitive deficit, limited communication at baseline, who was found on the floor at home onto a skilled nursing facility unresponsive.he was admitted for treatment of pneumonia.  Assessment/Plan: 1. Healthcare associated pneumonia.hemodynamic stable, no hypoxia. Appears to be rapidly improving. 2. Acute encephalopathy , appears to be resolved at this point. 3. Found down, history unavailable. No history to suggest seizure activity.suspect related to sepsis, acute illness. Do not think EEG indicated at this point.no further evaluation suggested at this point given clinical improvement. 4. Chronic kidney disease stage III, appears to be at baseline. 5. Diabetes mellitus, appears stable. 6. Hypertension 7. Thrombocytopenia.appears to be at baseline. 8. Chronic involuntary limb movement disorder, appears be stable. 9. Bipolar disorder 10. Cognitive deficit 11. History of stroke   Overall appears to be improving rapidly.do not think there is any reason to suspect MRSA pneumonia at this point. Plan to narrow antibiotics to monotherapy with possible return to skilled nursing facility 11/12.  Code Status: full code DVT prophylaxis: SCDs Family Communication:  Disposition Plan: likely return to [facility]  Murray Hodgkins, MD  [facility]  Pager [phone] If 7PM-7AM, please contact night-coverage at [email or web address], password [facility] 07/15/2014, 6:05 PM  LOS: 2 days   Consultants:    Procedures:    Antibiotics:  Vancomycin 11/9 >>  Zosyn 11/9 >>  HPI/Subjective: No complaints. Feeling fine.  Objective: Filed Vitals:   07/14/14 1522 07/14/14 2116 07/15/14 0635 07/15/14 0924  BP: 113/67 112/72 142/67 142/87  Pulse: 57 62 65 74  Temp: 98.3 F (36.8 C) 98.5 F (36.9 C) 99.1 F (37.3 C)   TempSrc: Oral Axillary Oral   Resp: 17  16 17    Height:      Weight:      SpO2: 100% 100% 100%     Intake/Output Summary (Last 24 hours) at 07/15/14 1805 Last data filed at 07/15/14 1735  Gross per 24 hour  Intake 3548.33 ml  Output   1925 ml  Net 1623.33 ml     Filed Weights   07/13/14 1843 07/14/14 0500  Weight: 69.3 kg (152 lb 12.5 oz) 69.2 kg (152 lb 8.9 oz)    Exam:     Afebrile, vital signs stable. No hypoxia.  General: appears calm, comfortable.  Psych: alert. Speech fluent and clear.  CV: regular rate and rhythm. No murmur, rub or gallop. No lower extremity edema. Telemetry sinus rhythm.  Respiratory: clear to auscultation bilaterally. No wheezes, rales or rhonchi. Normal respiratory effort  Data Reviewed:  Urine output 1600  Capillary blood sugar stable.  Basic metabolic panel noted, chronic kidney disease appears to be at baseline.  Platelet count stable. Hemoglobin stable 11.3.  Chest x-ray on admission demonstrated right upper lobe pneumonia.  CT head negative on admission.  Scheduled Meds: . antiseptic oral rinse  7 mL Mouth Rinse BID  . aspirin  325 mg Oral Daily  . baclofen  10 mg Oral TID  . Chlorhexidine Gluconate Cloth  6 each Topical Q0600  . cloNIDine  0.3 mg Oral Daily  . Influenza vac split quadrivalent PF  0.5 mL Intramuscular Tomorrow-1000  . insulin aspart  0-5 Units Subcutaneous QHS  . insulin aspart  0-9 Units Subcutaneous TID WC  . labetalol  200 mg Oral BID  . mupirocin ointment  1 application Nasal BID  . piperacillin-tazobactam (ZOSYN)  IV  3.375 g Intravenous Q8H  .  QUEtiapine  200 mg Oral BID  . ramipril  10 mg Oral Daily  . risperiDONE  1 mg Oral TID  . simvastatin  20 mg Oral q1800  . temazepam  15 mg Oral QHS  . trihexyphenidyl  5 mg Oral BID WC  . vancomycin  1,250 mg Intravenous Q24H   Continuous Infusions: . sodium chloride 100 mL/hr at 07/15/14 1021    Principal Problem:   HCAP (healthcare-associated pneumonia) Active Problems:   Essential  hypertension   Bipolar I disorder, most recent episode mixed   CVA, old, cognitive deficits   CKD (chronic kidney disease) stage 3, GFR 30-59 ml/min   Thrombocytopenia   Acute encephalopathy   Sepsis   Time spent 20 minutes

## 2014-07-15 NOTE — Plan of Care (Signed)
Problem: Phase III Progression Outcomes Goal: Foley discontinued Outcome: Not Applicable Date Met:  77/41/42

## 2014-07-16 DIAGNOSIS — A419 Sepsis, unspecified organism: Principal | ICD-10-CM

## 2014-07-16 LAB — GLUCOSE, CAPILLARY
GLUCOSE-CAPILLARY: 98 mg/dL (ref 70–99)
Glucose-Capillary: 149 mg/dL — ABNORMAL HIGH (ref 70–99)

## 2014-07-16 MED ORDER — AMOXICILLIN-POT CLAVULANATE 875-125 MG PO TABS: 1.0000 | ORAL_TABLET | Freq: Two times a day (BID) | ORAL | Status: DC

## 2014-07-16 NOTE — Progress Notes (Signed)
Patient discharge to [facility] via car. Attempt to call report to [facility] twice.

## 2014-07-16 NOTE — Plan of Care (Signed)
Problem: Phase I Progression Outcomes Goal: OOB as tolerated unless otherwise ordered Outcome: Not Progressing Patient uncooperative  with getting OOB to chair.

## 2014-07-16 NOTE — Discharge Summary (Signed)
Physician Discharge Summary  Derrick Butler LOV:[id number] DOB: 12/11/1955 DOA: 07/13/2014  PCP: No primary care provider on file.  Admit date: 07/13/2014 Discharge date: 07/16/2014  Recommendations for Outpatient Follow-up:  1. Healthcare associated pneumonia resolution 2. Chronic thrombocytopenia of unclear significance, consider further evaluation as an outpatient.  Discharge Diagnoses:  1. Healthcare associated pneumonia with possible sepsis on admission 2. Acute encephalopathy 3. Chronic kidney disease stage III 4. Diabetes mellitus type 2 5. Chronic thrombocytopenia 6. Cognitive deficit, bipolar disorder, chronic involuntary limb movement disorder  Discharge Condition: improved Disposition: return skilled nursing facility  Diet recommendation: carb modified  Filed Weights   07/13/14 1843 07/14/14 0500  Weight: 69.3 kg (152 lb 12.5 oz) 69.2 kg (152 lb 8.9 oz)    History of present illness:  58 year old man with cognitive deficit, limited communication at baseline, who was found on the floor at home onto a skilled nursing facility unresponsive. He was admitted for treatment of pneumonia.  Hospital Course:  Derrick Butler was treated with empiric antibiotics with a rapid improvement in pneumonia. No hypoxia or signs of hemodynamic instability. Mental status appears to be at baseline, encephalopathy rapidly resolved with treatment of pneumonia. Hospitalization was uncomplicated, plan return to skilled nursing facility.  1. Healthcare associated pneumonia with possible sepsis on admission. Appears clinically resolved. No hypoxia.stable hemodynamics. No leukocytosis. Responded well to Zosyn and vancomycin. Clinical picture does not suggest MRSA, therefore antibiotics narrowed to Augmentin. Given clinical improvement without atypical coverage, none will be provided.Radiographic findings do not suggest aspiration. 2. Acute encephalopathy resolved. Secondary to pneumonia. 3. Found  down, history unavailable. No history to suggest seizure activity. Suspect related to sepsis, acute illness. Do not think EEG indicated at this point. No further evaluation suggested at this point given rapid clinical improvement. 4. Chronic kidney disease stage III, stable. 5. Diabetes mellitus, remains stable 6. Thrombocytopenia. Appears to be at baseline. 7. Chronic involuntary limb movement disorder, appears be at baseline. 8. Bipolar disorder 9. Cognitive deficit 10. History of stroke  Consultants:  none  Procedures:  none  Antibiotics:  Vancomycin 11/9 >> 11/11  Zosyn 11/9 >>11/11  Augmentin 11/12 >> 11/16  Discharge Instructions   Current Discharge Medication List    START taking these medications   Details  amoxicillin-clavulanate (AUGMENTIN) 875-125 MG per tablet Take 1 tablet by mouth every 12 (twelve) hours. Last dose November 16 in the evening.      CONTINUE these medications which have NOT CHANGED   Details  aspirin 325 MG tablet Take 325 mg by mouth daily.      baclofen (LIORESAL) 10 MG tablet Take 10 mg by mouth 3 (three) times daily.    !! feeding supplement, ENSURE COMPLETE, (ENSURE COMPLETE) LIQD Take 237 mLs by mouth at bedtime.    fluticasone (FLONASE) 50 MCG/ACT nasal spray Place 2 sprays into the nose daily as needed. For allergies    labetalol (NORMODYNE) 200 MG tablet Take 200 mg by mouth 2 (two) times daily.      QUEtiapine (SEROQUEL) 200 MG tablet Take 200 mg by mouth 2 (two) times daily.    ramipril (ALTACE) 10 MG tablet Take 10 mg by mouth daily.      simvastatin (ZOCOR) 20 MG tablet Take 20 mg by mouth daily.    temazepam (RESTORIL) 15 MG capsule Take 15 mg by mouth at bedtime.      trihexyphenidyl (ARTANE) 5 MG tablet Take 5 mg by mouth 2 (two) times daily with a meal.  benztropine (COGENTIN) 1 MG tablet Take 1 mg by mouth 3 (three) times daily.    cloNIDine (CATAPRES) 0.3 MG tablet Take 0.3 mg by mouth daily.        divalproex (DEPAKOTE) 250 MG DR tablet Take 250 mg by mouth 3 (three) times daily.     !! feeding supplement, RESOURCE BREEZE, (RESOURCE BREEZE) LIQD Take 1 Container by mouth 3 (three) times daily between meals.    glyBURIDE (DIABETA) 5 MG tablet Take 5 mg by mouth daily with breakfast.      potassium chloride (KLOR-CON) 20 MEQ packet Take 20 mEq by mouth daily.      risperiDONE (RISPERDAL) 1 MG tablet Take 1 tablet (1 mg total) by mouth 3 (three) times daily. Qty: 90 tablet, Refills: 2     !! - Potential duplicate medications found. Please discuss with provider.    STOP taking these medications     NON FORMULARY      doxycycline (VIBRA-TABS) 100 MG tablet        No Known Allergies  The results of significant diagnostics from this hospitalization (including imaging, microbiology, ancillary and laboratory) are listed below for reference.    Significant Diagnostic Studies: Ct Head Wo Contrast  07/13/2014   CLINICAL DATA:  Altered mental status. First attempt Pt uncooperative, became combative. Second attempt pt was still somewhat combative and unwilling to hold still. Immobilization was utilized. ED DR. Wanted test to be done regardless of outcome  EXAM: CT HEAD WITHOUT CONTRAST  TECHNIQUE: Contiguous axial images were obtained from the base of the skull through the vertex without intravenous contrast.  COMPARISON:  09/06/2013 and 06/14/2013  FINDINGS: Multiple attempts made at imaging as patient was immobilized but remain combative as there is moderate motion artifact. Ventricles, cisterns and CSF spaces are within normal. 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 No definite mass, mass effect or midline shift. No obvious acute hemorrhage noted. No definite acute infarction. Old lacuner infarct over the left basal ganglia unchanged. Remainder the exam is unremarkable.  IMPRESSION: Limited exam due to moderate patient motion without acute  intracranial finding.  Old infarcts as described.   Electronically Signed   By: Marin Olp M.D.   On: 07/13/2014 16:29   Dg Chest Portable 1 View  07/13/2014   CLINICAL DATA:  Altered mental status. The patient is a unresponsive.  EXAM: PORTABLE CHEST - 1 VIEW  COMPARISON:  August 19, 2013  FINDINGS: The heart size and mediastinal contours are stable. The heart size is enlarged. The aorta is tortuous. Patient is status post prior median sternotomy. There is patchy consolidation of the right upper lobe. The left lung is clear. There is no pleural effusion. The visualized skeletal structures are stable P  IMPRESSION: Right upper lobe pneumonia.   Electronically Signed   By: Abelardo Diesel M.D.   On: 07/13/2014 14:51    Microbiology: Recent Results (from the past 240 hour(s))  Blood culture (routine x 2)     Status: None (Preliminary result)   Collection Time: 07/13/14  3:30 PM  Result Value Ref Range Status   Specimen Description BLOOD RIGHT ARM  Final   Special Requests BOTTLES DRAWN AEROBIC AND ANAEROBIC 8 CC EACH  Final   Culture NO GROWTH 3 DAYS  Final   Report Status PENDING  Incomplete  Blood culture (routine x 2)     Status: None (Preliminary result)   Collection Time: 07/13/14  3:33 PM  Result  Value Ref Range Status   Specimen Description BLOOD LEFT HAND  Final   Special Requests BOTTLES DRAWN AEROBIC ONLY 6CC  Final   Culture NO GROWTH 3 DAYS  Final   Report Status PENDING  Incomplete  MRSA PCR Screening     Status: Abnormal   Collection Time: 07/13/14  6:47 PM  Result Value Ref Range Status   MRSA by PCR POSITIVE (A) NEGATIVE Final    Comment:        The GeneXpert MRSA Assay (FDA approved for NASAL specimens only), is one component of a comprehensive MRSA colonization surveillance program. It is not intended to diagnose MRSA infection nor to guide or monitor treatment for MRSA infections. RESULT CALLED TO, READ BACK BY AND VERIFIED WITH: WAGONER R AT 2259 ON [id number] BY  FORSYTH K      Labs: Basic Metabolic Panel:  Recent Labs Lab 07/13/14 1421 07/14/14 0505 07/15/14 0542  NA 138 142 141  K 4.6 4.2 4.5  CL 103 108 106  CO2 23 22 22   GLUCOSE 144* 139* 208*  BUN 33* 24* 20  CREATININE 1.75* 1.55* 1.61*  CALCIUM 9.4 8.8 8.6   Liver Function Tests:  Recent Labs Lab 07/13/14 1421 07/14/14 0505  AST 13 12  ALT 16 13  ALKPHOS 79 68  BILITOT 0.4 0.7  PROT 6.6 5.9*  ALBUMIN 3.5 3.0*    Recent Labs Lab 07/13/14 1421  AMMONIA 21   CBC:  Recent Labs Lab 07/13/14 1421 07/14/14 0505 07/15/14 0542  WBC 11.0* 7.3 5.9  NEUTROABS 8.3*  --   --   HGB 12.5* 11.4* 11.3*  HCT 36.3* 33.2* 32.8*  MCV 89.2 87.6 87.5  PLT 86* 74* 90*   Cardiac Enzymes:  Recent Labs Lab 07/13/14 1421  TROPONINI <0.30   CBG:  Recent Labs Lab 07/15/14 1122 07/15/14 1628 07/15/14 2000 07/16/14 0745 07/16/14 1120  GLUCAP 154* 107* 124* 98 149*    Principal Problem:   HCAP (healthcare-associated pneumonia) Active Problems:   Essential hypertension   Bipolar I disorder, most recent episode mixed   CVA, old, cognitive deficits   CKD (chronic kidney disease) stage 3, GFR 30-59 ml/min   Thrombocytopenia   Acute encephalopathy   Sepsis   Time coordinating discharge: 35 minutes  Signed:  Murray Hodgkins, MD [facility] 07/16/2014, 3:55 PM

## 2014-07-16 NOTE — Progress Notes (Addendum)
PROGRESS NOTE  Derrick Butler [id number]:[id number] DOB: [birth year] DOA: 07/13/2014 PCP: No primary care provider on file.  Summary: 58 year old man with cognitive deficit, limited communication at baseline, who was found on the floor at home onto a skilled nursing facility unresponsive. He was admitted for treatment of pneumonia.  Assessment/Plan: 1. Healthcare associated pneumonia with possible sepsis on admission. Appears clinically resolved. No hypoxia.stable hemodynamics. No leukocytosis. Responded well to Zosyn and vancomycin. Clinical picture does not suggest MRSA, therefore antibiotics narrowed to Augmentin. Given clinical improvement without atypical coverage, none will be provided. Radiographic findings do not suggest aspiration. 2. Acute encephalopathy resolved. Secondary to pneumonia. 3. Found down, history unavailable. No history to suggest seizure activity. Suspect related to sepsis, acute illness. Do not think EEG indicated at this point. No further evaluation suggested at this point given rapid clinical improvement. 4. Chronic kidney disease stage III, stable. 5. Diabetes mellitus, remains stable 6. Thrombocytopenia.appears to be at baseline. 7. Chronic involuntary limb movement disorder, appears be at baseline. 8. Bipolar disorder 9. Cognitive deficit 10. History of stroke   Overall much improved. Pneumonia appears clinically resolved. Plan transfer back to skilled nursing facility on oral Augmentin today.  I discussed care with his power of attorney Willaim Bane who concurs with discharge.  Murray Hodgkins, MD  [facility]  Pager [phone] If 7PM-7AM, please contact night-coverage at [email or web address], password [facility] 07/16/2014, 1:38 PM  LOS: 3 days   Consultants:    Procedures:    Antibiotics:  Vancomycin 11/9 >> 11/11  Zosyn 11/9 >>11/11  Augmentin 11/12 >> 11/16  HPI/Subjective: Patient climbed out bed yesterday, no fall. Noted to be combative at  times.  No complaints, breathing well, feels well.  Objective: Filed Vitals:   07/15/14 0635 07/15/14 0924 07/15/14 2002 07/16/14 0548  BP: 142/67 142/87 145/65 161/82  Pulse: 65 74 59 62  Temp: 99.1 F (37.3 C)  97.7 F (36.5 C) 98.4 F (36.9 C)  TempSrc: Oral  Oral Oral  Resp: 17  18 18   Height:      Weight:      SpO2: 100%  99% 99%    Intake/Output Summary (Last 24 hours) at 07/16/14 1338 Last data filed at 07/16/14 1318  Gross per 24 hour  Intake 3748.33 ml  Output   1950 ml  Net 1798.33 ml     Filed Weights   07/13/14 1843 07/14/14 0500  Weight: 69.3 kg (152 lb 12.5 oz) 69.2 kg (152 lb 8.9 oz)    Exam:     Afebrile, vital signs stable. No hypoxia.  General. Appears calm and comfortable. Vigorous.  Psych. Alert, speech fluent and clear.  CV. Regular rate and rhythm. No murmur, rub or gallop. Distant. No lower extremity edema. Telemetry sinus rhythm with artifact. Reviewed with cardiology.  Respiratory clear to auscultation bilaterally. No wheezes, rales or rhonchi. Normal respiratory effort.  musculoskeletal. Excellent tone and strength all extremities.  Data Reviewed:  Urine output 1225  Capillary blood sugar stable.  Blood cultures no growth today  Scheduled Meds: . amoxicillin-clavulanate  1 tablet Oral Q12H  . antiseptic oral rinse  7 mL Mouth Rinse BID  . aspirin  325 mg Oral Daily  . baclofen  10 mg Oral TID  . Chlorhexidine Gluconate Cloth  6 each Topical Q0600  . cloNIDine  0.3 mg Oral Daily  . Influenza vac split quadrivalent PF  0.5 mL Intramuscular Tomorrow-1000  . insulin aspart  0-5 Units Subcutaneous QHS  . insulin aspart  0-9 Units  Subcutaneous TID WC  . labetalol  200 mg Oral BID  . mupirocin ointment  1 application Nasal BID  . QUEtiapine  200 mg Oral BID  . ramipril  10 mg Oral Daily  . risperiDONE  1 mg Oral TID  . simvastatin  20 mg Oral q1800  . temazepam  15 mg Oral QHS  . trihexyphenidyl  5 mg Oral BID WC    Continuous Infusions:    Principal Problem:   HCAP (healthcare-associated pneumonia) Active Problems:   Essential hypertension   Bipolar I disorder, most recent episode mixed   CVA, old, cognitive deficits   CKD (chronic kidney disease) stage 3, GFR 30-59 ml/min   Thrombocytopenia   Acute encephalopathy   Sepsis

## 2014-07-16 NOTE — Plan of Care (Signed)
Problem: Phase II Progression Outcomes Goal: Vital signs remain stable Outcome: Progressing     

## 2014-07-16 NOTE — Clinical Social Work Note (Signed)
Pt d/c today back to [facility]. Pt's guardian and facility aware and agreeable. Guardian to provide transport. CSW will fax d/c summary upon completion.   Benay Pike, [place]

## 2014-07-18 LAB — CULTURE, BLOOD (ROUTINE X 2)
Culture: NO GROWTH
Culture: NO GROWTH

## 2014-09-05 ENCOUNTER — Emergency Department (HOSPITAL_COMMUNITY)
Admission: EM | Admit: 2014-09-05 | Discharge: 2014-09-05 | Disposition: A | Discharge status: Home / Self Care | Payer: PRIVATE HEALTH INSURANCE | Attending: Emergency Medicine | Admitting: Emergency Medicine

## 2014-09-05 ENCOUNTER — Encounter (HOSPITAL_COMMUNITY): Payer: Self-pay | Admitting: *Deleted

## 2014-09-05 DIAGNOSIS — I2510 Atherosclerotic heart disease of native coronary artery without angina pectoris: Secondary | ICD-10-CM | POA: Insufficient documentation

## 2014-09-05 DIAGNOSIS — N289 Disorder of kidney and ureter, unspecified: Secondary | ICD-10-CM | POA: Diagnosis not present

## 2014-09-05 DIAGNOSIS — Z7982 Long term (current) use of aspirin: Secondary | ICD-10-CM | POA: Insufficient documentation

## 2014-09-05 DIAGNOSIS — Z8639 Personal history of other endocrine, nutritional and metabolic disease: Secondary | ICD-10-CM | POA: Diagnosis not present

## 2014-09-05 DIAGNOSIS — E119 Type 2 diabetes mellitus without complications: Secondary | ICD-10-CM | POA: Diagnosis not present

## 2014-09-05 DIAGNOSIS — Z79899 Other long term (current) drug therapy: Secondary | ICD-10-CM | POA: Insufficient documentation

## 2014-09-05 DIAGNOSIS — Z792 Long term (current) use of antibiotics: Secondary | ICD-10-CM | POA: Diagnosis not present

## 2014-09-05 DIAGNOSIS — Z8673 Personal history of transient ischemic attack (TIA), and cerebral infarction without residual deficits: Secondary | ICD-10-CM | POA: Insufficient documentation

## 2014-09-05 DIAGNOSIS — Z7952 Long term (current) use of systemic steroids: Secondary | ICD-10-CM | POA: Diagnosis not present

## 2014-09-05 DIAGNOSIS — I10 Essential (primary) hypertension: Secondary | ICD-10-CM | POA: Diagnosis not present

## 2014-09-05 DIAGNOSIS — R4182 Altered mental status, unspecified: Secondary | ICD-10-CM | POA: Diagnosis present

## 2014-09-05 LAB — COMPREHENSIVE METABOLIC PANEL
ALK PHOS: 78 U/L (ref 39–117)
ALT: 42 U/L (ref 0–53)
ANION GAP: 8 (ref 5–15)
AST: 25 U/L (ref 0–37)
Albumin: 4 g/dL (ref 3.5–5.2)
BUN: 61 mg/dL — AB (ref 6–23)
CHLORIDE: 110 meq/L (ref 96–112)
CO2: 22 mmol/L (ref 19–32)
CREATININE: 2.89 mg/dL — AB (ref 0.50–1.35)
Calcium: 9.2 mg/dL (ref 8.4–10.5)
GFR calc Af Amer: 26 mL/min — ABNORMAL LOW (ref >90)
GFR, EST NON AFRICAN AMERICAN: 23 mL/min — AB (ref >90)
GLUCOSE: 110 mg/dL — AB (ref 70–99)
Potassium: 5.2 mmol/L — ABNORMAL HIGH (ref 3.5–5.1)
Sodium: 140 mmol/L (ref 135–145)
TOTAL PROTEIN: 6.3 g/dL (ref 6.0–8.3)
Total Bilirubin: 0.8 mg/dL (ref 0.3–1.2)

## 2014-09-05 LAB — CBC WITH DIFFERENTIAL/PLATELET
BASOS ABS: 0 10*3/uL (ref 0.0–0.1)
BASOS PCT: 0 % (ref 0–1)
Eosinophils Absolute: 0.1 10*3/uL (ref 0.0–0.7)
Eosinophils Relative: 2 % (ref 0–5)
HCT: 34.6 % — ABNORMAL LOW (ref 39.0–52.0)
HEMOGLOBIN: 11.9 g/dL — AB (ref 13.0–17.0)
LYMPHS ABS: 1.5 10*3/uL (ref 0.7–4.0)
LYMPHS PCT: 29 % (ref 12–46)
MCH: 30.2 pg (ref 26.0–34.0)
MCHC: 34.4 g/dL (ref 30.0–36.0)
MCV: 87.8 fL (ref 78.0–100.0)
MONO ABS: 0.4 10*3/uL (ref 0.1–1.0)
Monocytes Relative: 8 % (ref 3–12)
Neutro Abs: 3.1 10*3/uL (ref 1.7–7.7)
Neutrophils Relative %: 60 % (ref 43–77)
PLATELETS: 85 10*3/uL — AB (ref 150–400)
RBC: 3.94 MIL/uL — ABNORMAL LOW (ref 4.22–5.81)
RDW: 12.2 % (ref 11.5–15.5)
WBC: 5.2 10*3/uL (ref 4.0–10.5)

## 2014-09-05 LAB — VALPROIC ACID LEVEL: Valproic Acid Lvl: 17.3 ug/mL — ABNORMAL LOW (ref 50.0–100.0)

## 2014-09-05 NOTE — ED Provider Notes (Addendum)
CSN: [id number]     Arrival date & time 09/05/14  1116 History  This chart was scribed for Derrick Dakin, MD by Stephania Fragmin, ED Scribe. This patient was seen in room APA14/APA14 and the patient's care was started at 11:33 AM.    Chief Complaint  Patient presents with  . Altered Mental Status   level V caveat altered mental status history is obtained from [place], nurse at skilled nursing facility where patient lives HPI   HPI Comments: Derrick Butler is a 59 y.o. male with a history of bipolar disorder who presents to the Emergency Department for altered mental status. Per nurse at [facility] who has worked with him for a year, patient has never been combative, but this morning refused to take medications and swung at nursing home staff. Per nurse, patient recently had Depakote levels changed about 2 months ago, but patient has been unwilling to take labs to test levels since then. Patient states that he doesn't like needle sticking. Dr. Maudie Mercury is his PCP. Patient denies smoking and EtOH consumption. Miss Susy Manor reports that he refused to have his blood drawn this morning and was combative however presently he looks at his baseline to her. Patient reports to me that he feels well however he just does not like to have his blood drawn   Past Medical History  Diagnosis Date  . Aortic dissection   . Hypertension   . CVA (cerebral infarction)   . Aortic dissection   . Hypertension   . CVA (cerebral infarction)   . Diabetes mellitus   . Cardiomyopathy   . Hyperglycemia   . Bipolar 1 disorder   . Hyperkalemia   . Coronary artery disease   . Hyponatremia   . Altered mental status     with psychosis  . Dehydration   . TIA (transient ischemic attack)   . Lack of coordination   . Cardiomyopathy    Past Surgical History  Procedure Laterality Date  . Ascending aortic aneurysm repair     Family History  Problem Relation Age of Onset  . Alzheimer's disease Mother   . Stroke Father   .  Heart disease Father   . Bipolar disorder Father   . Bipolar disorder Sister   . Bipolar disorder Sister    History  Substance Use Topics  . Smoking status: Never Smoker   . Smokeless tobacco: Not on file  . Alcohol Use: No    Review of Systems  Unable to perform ROS Neurological:       Abnormal head movements which is baseline for patient per Miss Cochran   altered mental status    Allergies  Review of patient's allergies indicates no known allergies.  Home Medications   Prior to Admission medications   Medication Sig Start Date End Date Taking? Authorizing Provider  amoxicillin-clavulanate (AUGMENTIN) 875-125 MG per tablet Take 1 tablet by mouth every 12 (twelve) hours. Last dose November 16 in the evening. 07/16/14   [name], MD  aspirin 325 MG tablet Take 325 mg by mouth daily.      Historical Provider, MD  baclofen (LIORESAL) 10 MG tablet Take 10 mg by mouth 3 (three) times daily.    Historical Provider, MD  benztropine (COGENTIN) 1 MG tablet Take 1 mg by mouth 3 (three) times daily.    Historical Provider, MD  cloNIDine (CATAPRES) 0.3 MG tablet Take 0.3 mg by mouth daily.      Historical Provider, MD  divalproex (DEPAKOTE)  250 MG DR tablet Take 250 mg by mouth 3 (three) times daily.     Historical Provider, MD  feeding supplement, ENSURE COMPLETE, (ENSURE COMPLETE) LIQD Take 237 mLs by mouth at bedtime.    Historical Provider, MD  feeding supplement, RESOURCE BREEZE, (RESOURCE BREEZE) LIQD Take 1 Container by mouth 3 (three) times daily between meals.    Historical Provider, MD  fluticasone (FLONASE) 50 MCG/ACT nasal spray Place 2 sprays into the nose daily as needed. For allergies    Historical Provider, MD  glyBURIDE (DIABETA) 5 MG tablet Take 5 mg by mouth daily with breakfast.      Historical Provider, MD  labetalol (NORMODYNE) 200 MG tablet Take 200 mg by mouth 2 (two) times daily.      Historical Provider, MD  potassium chloride (KLOR-CON) 20 MEQ packet  Take 20 mEq by mouth daily.      Historical Provider, MD  QUEtiapine (SEROQUEL) 200 MG tablet Take 200 mg by mouth 2 (two) times daily.    Historical Provider, MD  ramipril (ALTACE) 10 MG tablet Take 10 mg by mouth daily.      Historical Provider, MD  risperiDONE (RISPERDAL) 1 MG tablet Take 1 tablet (1 mg total) by mouth 3 (three) times daily. 06/05/13 06/05/14  [name], MD  simvastatin (ZOCOR) 20 MG tablet Take 20 mg by mouth daily.    Historical Provider, MD  temazepam (RESTORIL) 15 MG capsule Take 15 mg by mouth at bedtime.      Historical Provider, MD  trihexyphenidyl (ARTANE) 5 MG tablet Take 5 mg by mouth 2 (two) times daily with a meal.    Historical Provider, MD   BP 93/78 mmHg  Pulse 74  Temp(Src) 98.7 F (37.1 C) (Oral)  Resp 16  SpO2 99% Physical Exam  Constitutional: He appears well-developed and well-nourished. No distress.  HENT:  Head: Normocephalic and atraumatic.  Eyes: Conjunctivae are normal. Pupils are equal, round, and reactive to light.  Neck: Neck supple. No tracheal deviation present. No thyromegaly present.  Cardiovascular: Normal rate and regular rhythm.   No murmur heard. Pulmonary/Chest: Effort normal and breath sounds normal.  Abdominal: Soft. Bowel sounds are normal. He exhibits no distension. There is no tenderness.  Musculoskeletal: Normal range of motion. He exhibits no edema or tenderness.  Neurological: He is alert. Coordination normal.  Follow simple commands moves all extremities. Choreiform movements of head and neck  Skin: Skin is warm and dry. No rash noted.  Psychiatric:  Pleasant and cooperative  Nursing note and vitals reviewed.   ED Course  Procedures (including critical care time)  DIAGNOSTIC STUDIES: Oxygen Saturation is 99% on room air, normal by my interpretation.    COORDINATION OF CARE: 11:36 AM - Discussed treatment plan with pt at bedside.  Labs Review Labs Reviewed - No data to display  Imaging Review No results  found.   EKG Interpretation None     1:40 PM patient continues to act at baseline per Ms cochran Results for orders placed or performed during the hospital encounter of 09/05/14  Comprehensive metabolic panel  Result Value Ref Range   Sodium 140 135 - 145 mmol/L   Potassium 5.2 (H) 3.5 - 5.1 mmol/L   Chloride 110 96 - 112 mEq/L   CO2 22 19 - 32 mmol/L   Glucose, Bld 110 (H) 70 - 99 mg/dL   BUN 61 (H) 6 - 23 mg/dL   Creatinine, Ser 2.89 (H) 0.50 - 1.35 mg/dL   Calcium 9.2  8.4 - 10.5 mg/dL   Total Protein 6.3 6.0 - 8.3 g/dL   Albumin 4.0 3.5 - 5.2 g/dL   AST 25 0 - 37 U/L   ALT 42 0 - 53 U/L   Alkaline Phosphatase 78 39 - 117 U/L   Total Bilirubin 0.8 0.3 - 1.2 mg/dL   GFR calc non Af Amer 23 (L) >90 mL/min   GFR calc Af Amer 26 (L) >90 mL/min   Anion gap 8 5 - 15  CBC with Differential  Result Value Ref Range   WBC 5.2 4.0 - 10.5 K/uL   RBC 3.94 (L) 4.22 - 5.81 MIL/uL   Hemoglobin 11.9 (L) 13.0 - 17.0 g/dL   HCT 34.6 (L) 39.0 - 52.0 %   MCV 87.8 78.0 - 100.0 fL   MCH 30.2 26.0 - 34.0 pg   MCHC 34.4 30.0 - 36.0 g/dL   RDW 12.2 11.5 - 15.5 %   Platelets 85 (L) 150 - 400 K/uL   Neutrophils Relative % 60 43 - 77 %   Neutro Abs 3.1 1.7 - 7.7 K/uL   Lymphocytes Relative 29 12 - 46 %   Lymphs Abs 1.5 0.7 - 4.0 K/uL   Monocytes Relative 8 3 - 12 %   Monocytes Absolute 0.4 0.1 - 1.0 K/uL   Eosinophils Relative 2 0 - 5 %   Eosinophils Absolute 0.1 0.0 - 0.7 K/uL   Basophils Relative 0 0 - 1 %   Basophils Absolute 0.0 0.0 - 0.1 K/uL  Valproic acid level  Result Value Ref Range   Valproic Acid Lvl 17.3 (L) 50.0 - 100.0 ug/mL   No results found.  MDM  I discussed renal insufficiency with Dr. Willey Blade. Plan he will be followed at [facility]. Also discussed subtherapeutic valproic acid level. Valproic acid is administered for behavioral issues rather than for seizures. Thrombocytopenia is chronic Plan return to nursing home Diagnosis #1 bipolar disorder #2  subtherapeutic valproic acid level #3 renal insufficiency #4thrombocytopenia Final diagnoses:  None     I personally performed the services described in this documentation, which was scribed in my presence. The recorded information has been reviewed and considered.   Derrick Dakin, MD 09/05/14 1354  Derrick Dakin, MD 09/05/14 1355

## 2014-09-05 NOTE — Discharge Instructions (Signed)
Return if condition worsens for any reason or call Dr. Legrand Rams. We have contacted Dr. Willey Blade. He will contact Dr. Legrand Rams for further treatment plan

## 2014-09-05 NOTE — ED Notes (Signed)
Pt comes in from [facility] by [facility]. Pt has nurse at bedside. Pt is alert and oriented. Nurse states that the pt starting becoming violent today towards stuff. Pt has "fidgit" present, nurse states this is normal for him but the violent actions towards staff are not. Pt denies pain any where. Nurses states pt had depakote mg change in November and pt has refused labs to test levels since then. NAD noted at this time.

## 2014-09-25 ENCOUNTER — Inpatient Hospital Stay (HOSPITAL_COMMUNITY)
Admission: EM | Admit: 2014-09-25 | Discharge: 2014-10-13 | DRG: 682 | Disposition: A | Discharge status: Skilled Nursing Facility | Payer: Medicare Other | Attending: Internal Medicine | Admitting: Internal Medicine

## 2014-09-25 ENCOUNTER — Encounter (HOSPITAL_COMMUNITY): Payer: Self-pay | Admitting: *Deleted

## 2014-09-25 DIAGNOSIS — I82C12 Acute embolism and thrombosis of left internal jugular vein: Secondary | ICD-10-CM | POA: Diagnosis present

## 2014-09-25 DIAGNOSIS — D696 Thrombocytopenia, unspecified: Secondary | ICD-10-CM

## 2014-09-25 DIAGNOSIS — L03114 Cellulitis of left upper limb: Secondary | ICD-10-CM | POA: Diagnosis present

## 2014-09-25 DIAGNOSIS — R509 Fever, unspecified: Secondary | ICD-10-CM

## 2014-09-25 DIAGNOSIS — Z8673 Personal history of transient ischemic attack (TIA), and cerebral infarction without residual deficits: Secondary | ICD-10-CM

## 2014-09-25 DIAGNOSIS — N179 Acute kidney failure, unspecified: Principal | ICD-10-CM | POA: Diagnosis present

## 2014-09-25 DIAGNOSIS — G92 Toxic encephalopathy: Secondary | ICD-10-CM | POA: Diagnosis present

## 2014-09-25 DIAGNOSIS — I672 Cerebral atherosclerosis: Secondary | ICD-10-CM | POA: Diagnosis present

## 2014-09-25 DIAGNOSIS — N183 Chronic kidney disease, stage 3 unspecified: Secondary | ICD-10-CM | POA: Diagnosis present

## 2014-09-25 DIAGNOSIS — I2510 Atherosclerotic heart disease of native coronary artery without angina pectoris: Secondary | ICD-10-CM | POA: Diagnosis present

## 2014-09-25 DIAGNOSIS — D61818 Other pancytopenia: Secondary | ICD-10-CM | POA: Diagnosis present

## 2014-09-25 DIAGNOSIS — E860 Dehydration: Secondary | ICD-10-CM | POA: Diagnosis present

## 2014-09-25 DIAGNOSIS — B9562 Methicillin resistant Staphylococcus aureus infection as the cause of diseases classified elsewhere: Secondary | ICD-10-CM | POA: Diagnosis present

## 2014-09-25 DIAGNOSIS — Z823 Family history of stroke: Secondary | ICD-10-CM | POA: Diagnosis not present

## 2014-09-25 DIAGNOSIS — I429 Cardiomyopathy, unspecified: Secondary | ICD-10-CM | POA: Diagnosis present

## 2014-09-25 DIAGNOSIS — Z818 Family history of other mental and behavioral disorders: Secondary | ICD-10-CM

## 2014-09-25 DIAGNOSIS — E870 Hyperosmolality and hypernatremia: Secondary | ICD-10-CM | POA: Diagnosis present

## 2014-09-25 DIAGNOSIS — I129 Hypertensive chronic kidney disease with stage 1 through stage 4 chronic kidney disease, or unspecified chronic kidney disease: Secondary | ICD-10-CM | POA: Diagnosis present

## 2014-09-25 DIAGNOSIS — Z820 Family history of epilepsy and other diseases of the nervous system: Secondary | ICD-10-CM

## 2014-09-25 DIAGNOSIS — F0150 Vascular dementia without behavioral disturbance: Secondary | ICD-10-CM | POA: Diagnosis present

## 2014-09-25 DIAGNOSIS — E875 Hyperkalemia: Secondary | ICD-10-CM | POA: Diagnosis present

## 2014-09-25 DIAGNOSIS — G9340 Encephalopathy, unspecified: Secondary | ICD-10-CM

## 2014-09-25 DIAGNOSIS — F3160 Bipolar disorder, current episode mixed, unspecified: Secondary | ICD-10-CM | POA: Diagnosis present

## 2014-09-25 DIAGNOSIS — R310 Gross hematuria: Secondary | ICD-10-CM | POA: Diagnosis present

## 2014-09-25 DIAGNOSIS — I69319 Unspecified symptoms and signs involving cognitive functions following cerebral infarction: Secondary | ICD-10-CM

## 2014-09-25 DIAGNOSIS — E11649 Type 2 diabetes mellitus with hypoglycemia without coma: Secondary | ICD-10-CM | POA: Diagnosis present

## 2014-09-25 DIAGNOSIS — R4182 Altered mental status, unspecified: Secondary | ICD-10-CM

## 2014-09-25 DIAGNOSIS — Z8249 Family history of ischemic heart disease and other diseases of the circulatory system: Secondary | ICD-10-CM

## 2014-09-25 DIAGNOSIS — E1165 Type 2 diabetes mellitus with hyperglycemia: Secondary | ICD-10-CM | POA: Diagnosis present

## 2014-09-25 DIAGNOSIS — E785 Hyperlipidemia, unspecified: Secondary | ICD-10-CM | POA: Diagnosis present

## 2014-09-25 DIAGNOSIS — I82402 Acute embolism and thrombosis of unspecified deep veins of left lower extremity: Secondary | ICD-10-CM

## 2014-09-25 DIAGNOSIS — E162 Hypoglycemia, unspecified: Secondary | ICD-10-CM

## 2014-09-25 LAB — CBC WITH DIFFERENTIAL/PLATELET
Basophils Absolute: 0 10*3/uL (ref 0.0–0.1)
Basophils Relative: 1 % (ref 0–1)
EOS PCT: 2 % (ref 0–5)
Eosinophils Absolute: 0.1 10*3/uL (ref 0.0–0.7)
HEMATOCRIT: 36.6 % — AB (ref 39.0–52.0)
Hemoglobin: 12.2 g/dL — ABNORMAL LOW (ref 13.0–17.0)
Lymphocytes Relative: 36 % (ref 12–46)
Lymphs Abs: 2 10*3/uL (ref 0.7–4.0)
MCH: 30.8 pg (ref 26.0–34.0)
MCHC: 33.3 g/dL (ref 30.0–36.0)
MCV: 92.4 fL (ref 78.0–100.0)
MONOS PCT: 6 % (ref 3–12)
Monocytes Absolute: 0.3 10*3/uL (ref 0.1–1.0)
Neutro Abs: 3 10*3/uL (ref 1.7–7.7)
Neutrophils Relative %: 55 % (ref 43–77)
PLATELETS: 102 10*3/uL — AB (ref 150–400)
RBC: 3.96 MIL/uL — AB (ref 4.22–5.81)
RDW: 12.6 % (ref 11.5–15.5)
WBC: 5.5 10*3/uL (ref 4.0–10.5)

## 2014-09-25 LAB — URINALYSIS, ROUTINE W REFLEX MICROSCOPIC
BILIRUBIN URINE: NEGATIVE
Glucose, UA: NEGATIVE mg/dL
HGB URINE DIPSTICK: NEGATIVE
Ketones, ur: NEGATIVE mg/dL
Leukocytes, UA: NEGATIVE
Nitrite: NEGATIVE
PROTEIN: NEGATIVE mg/dL
SPECIFIC GRAVITY, URINE: 1.025 (ref 1.005–1.030)
UROBILINOGEN UA: 0.2 mg/dL (ref 0.0–1.0)
pH: 5.5 (ref 5.0–8.0)

## 2014-09-25 LAB — COMPREHENSIVE METABOLIC PANEL
ALT: 28 U/L (ref 0–53)
AST: 32 U/L (ref 0–37)
Albumin: 4 g/dL (ref 3.5–5.2)
Alkaline Phosphatase: 71 U/L (ref 39–117)
Anion gap: 8 (ref 5–15)
BUN: 53 mg/dL — ABNORMAL HIGH (ref 6–23)
CO2: 27 mmol/L (ref 19–32)
Calcium: 10.1 mg/dL (ref 8.4–10.5)
Chloride: 115 mmol/L — ABNORMAL HIGH (ref 96–112)
Creatinine, Ser: 2.36 mg/dL — ABNORMAL HIGH (ref 0.50–1.35)
GFR calc Af Amer: 34 mL/min — ABNORMAL LOW (ref >90)
GFR calc non Af Amer: 29 mL/min — ABNORMAL LOW (ref >90)
Glucose, Bld: 102 mg/dL — ABNORMAL HIGH (ref 70–99)
Potassium: 6.5 mmol/L (ref 3.5–5.1)
SODIUM: 150 mmol/L — AB (ref 135–145)
Total Bilirubin: 0.7 mg/dL (ref 0.3–1.2)
Total Protein: 6.7 g/dL (ref 6.0–8.3)

## 2014-09-25 LAB — GLUCOSE, CAPILLARY
GLUCOSE-CAPILLARY: 144 mg/dL — AB (ref 70–99)
GLUCOSE-CAPILLARY: 65 mg/dL — AB (ref 70–99)
GLUCOSE-CAPILLARY: 117 mg/dL — AB (ref 70–99)
Glucose-Capillary: 98 mg/dL (ref 70–99)
Glucose-Capillary: 84 mg/dL (ref 70–99)

## 2014-09-25 LAB — CBG MONITORING, ED
Glucose-Capillary: 99 mg/dL (ref 70–99)
Glucose-Capillary: 28 mg/dL — CL (ref 70–99)
Glucose-Capillary: 42 mg/dL — CL (ref 70–99)
Glucose-Capillary: 143 mg/dL — ABNORMAL HIGH (ref 70–99)

## 2014-09-25 LAB — RAPID URINE DRUG SCREEN, HOSP PERFORMED
Amphetamines: NOT DETECTED
Barbiturates: NOT DETECTED
Benzodiazepines: NOT DETECTED
Cocaine: NOT DETECTED
OPIATES: NOT DETECTED
Tetrahydrocannabinol: NOT DETECTED

## 2014-09-25 LAB — VALPROIC ACID LEVEL: Valproic Acid Lvl: 27.3 ug/mL — ABNORMAL LOW (ref 50.0–100.0)

## 2014-09-25 LAB — POTASSIUM: Potassium: 4.4 mmol/L (ref 3.5–5.1)

## 2014-09-25 LAB — ETHANOL

## 2014-09-25 LAB — MRSA PCR SCREENING: MRSA by PCR: POSITIVE — AB

## 2014-09-25 MED ORDER — LABETALOL HCL 5 MG/ML IV SOLN
10.0000 mg | INTRAVENOUS | Status: DC | PRN
  Administered 2014-09-29 – 2014-10-02 (×3): 10 mg via INTRAVENOUS
  Filled 2014-09-25 (×4): qty 4

## 2014-09-25 MED ORDER — TRIHEXYPHENIDYL HCL 5 MG PO TABS
5.0000 mg | ORAL_TABLET | Freq: Two times a day (BID) | ORAL | Status: DC
  Administered 2014-09-25 – 2014-09-30 (×4): 5 mg via ORAL
  Filled 2014-09-25: qty 1
  Filled 2014-09-25: qty 3
  Filled 2014-09-25 (×6): qty 1
  Filled 2014-09-25 (×2): qty 3
  Filled 2014-09-25 (×2): qty 1

## 2014-09-25 MED ORDER — ZIPRASIDONE MESYLATE 20 MG IM SOLR
10.0000 mg | Freq: Once | INTRAMUSCULAR | Status: AC
  Administered 2014-09-25: 10 mg via INTRAMUSCULAR
  Filled 2014-09-25: qty 20

## 2014-09-25 MED ORDER — STERILE WATER FOR INJECTION IJ SOLN
INTRAMUSCULAR | Status: AC
  Administered 2014-09-25: 0.6 mL
  Filled 2014-09-25: qty 10

## 2014-09-25 MED ORDER — DEXTROSE 50 % IV SOLN: 1.0000 | Freq: Once | INTRAVENOUS | Status: DC

## 2014-09-25 MED ORDER — SODIUM CHLORIDE 0.9 % IV SOLN: INTRAVENOUS | Status: DC

## 2014-09-25 MED ORDER — LABETALOL HCL 200 MG PO TABS: 200.0000 mg | ORAL_TABLET | Freq: Two times a day (BID) | ORAL | Status: DC

## 2014-09-25 MED ORDER — INSULIN ASPART 100 UNIT/ML ~~LOC~~ SOLN
SUBCUTANEOUS | Status: AC
  Filled 2014-09-25: qty 1

## 2014-09-25 MED ORDER — LORAZEPAM 2 MG/ML IJ SOLN
1.0000 mg | Freq: Once | INTRAMUSCULAR | Status: AC
  Administered 2014-09-25: 1 mg via INTRAVENOUS
  Filled 2014-09-25: qty 1

## 2014-09-25 MED ORDER — DEXTROSE 50 % IV SOLN
25.0000 mL | Freq: Once | INTRAVENOUS | Status: AC
  Administered 2014-09-25: 25 mL via INTRAVENOUS

## 2014-09-25 MED ORDER — MIDAZOLAM HCL 50 MG/10ML IJ SOLN
INTRAMUSCULAR | Status: AC
  Filled 2014-09-25: qty 1

## 2014-09-25 MED ORDER — BACLOFEN 10 MG PO TABS
10.0000 mg | ORAL_TABLET | Freq: Three times a day (TID) | ORAL | Status: DC
  Administered 2014-09-27 (×2): 10 mg via ORAL
  Filled 2014-09-25 (×9): qty 1

## 2014-09-25 MED ORDER — DEXTROSE 50 % IV SOLN
INTRAVENOUS | Status: AC
  Filled 2014-09-25: qty 50

## 2014-09-25 MED ORDER — SIMVASTATIN 20 MG PO TABS
20.0000 mg | ORAL_TABLET | Freq: Every day | ORAL | Status: DC
  Administered 2014-09-27 – 2014-10-07 (×8): 20 mg via ORAL
  Filled 2014-09-25 (×9): qty 1

## 2014-09-25 MED ORDER — SODIUM CHLORIDE 0.9 % IV BOLUS (SEPSIS)
1000.0000 mL | Freq: Once | INTRAVENOUS | Status: AC
  Administered 2014-09-25: 1000 mL via INTRAVENOUS

## 2014-09-25 MED ORDER — QUETIAPINE FUMARATE 200 MG PO TABS
200.0000 mg | ORAL_TABLET | Freq: Two times a day (BID) | ORAL | Status: DC
  Administered 2014-09-27 – 2014-10-13 (×23): 200 mg via ORAL
  Filled 2014-09-25 (×38): qty 1

## 2014-09-25 MED ORDER — DEXTROSE 50 % IV SOLN
50.0000 mL | Freq: Once | INTRAVENOUS | Status: AC
  Administered 2014-09-25: 50 mL via INTRAVENOUS

## 2014-09-25 MED ORDER — SODIUM CHLORIDE 0.9 % IV SOLN
1.0000 mg/h | INTRAVENOUS | Status: DC
  Administered 2014-09-25: 0.5 mg/h via INTRAVENOUS
  Administered 2014-09-26: 1 mg/h via INTRAVENOUS
  Filled 2014-09-25: qty 10

## 2014-09-25 MED ORDER — HALOPERIDOL LACTATE 5 MG/ML IJ SOLN
5.0000 mg | Freq: Once | INTRAMUSCULAR | Status: AC
  Administered 2014-09-25: 5 mg via INTRAVENOUS
  Filled 2014-09-25: qty 1

## 2014-09-25 MED ORDER — DEXTROSE-NACL 5-0.45 % IV SOLN
INTRAVENOUS | Status: DC
  Administered 2014-09-25 – 2014-10-08 (×22): via INTRAVENOUS
  Administered 2014-10-08: 1000 mL via INTRAVENOUS
  Administered 2014-10-09 – 2014-10-10 (×2): via INTRAVENOUS

## 2014-09-25 MED ORDER — SODIUM BICARBONATE 8.4 % IV SOLN
50.0000 meq | Freq: Once | INTRAVENOUS | Status: AC
  Administered 2014-09-25: 50 meq via INTRAVENOUS
  Filled 2014-09-25: qty 50

## 2014-09-25 MED ORDER — DEXTROSE 50 % IV SOLN
1.0000 | Freq: Once | INTRAVENOUS | Status: AC
  Administered 2014-09-25: 50 mL via INTRAVENOUS

## 2014-09-25 MED ORDER — SODIUM CHLORIDE 0.9 % IV SOLN
1.0000 g | Freq: Once | INTRAVENOUS | Status: AC
  Administered 2014-09-25: 1 g via INTRAVENOUS
  Filled 2014-09-25: qty 10

## 2014-09-25 MED ORDER — DIVALPROEX SODIUM 125 MG PO CPSP
250.0000 mg | ORAL_CAPSULE | Freq: Three times a day (TID) | ORAL | Status: DC
  Administered 2014-09-25: 250 mg via ORAL
  Filled 2014-09-25 (×9): qty 2

## 2014-09-25 MED ORDER — INSULIN ASPART 100 UNIT/ML IV SOLN
10.0000 [IU] | Freq: Once | INTRAVENOUS | Status: AC
  Administered 2014-09-25: 10 [IU] via INTRAVENOUS

## 2014-09-25 MED ORDER — VALPROATE SODIUM 500 MG/5ML IV SOLN
250.0000 mg | Freq: Three times a day (TID) | INTRAVENOUS | Status: DC
  Administered 2014-09-26 – 2014-09-28 (×8): 250 mg via INTRAVENOUS
  Filled 2014-09-25 (×8): qty 2.5

## 2014-09-25 MED ORDER — ACETAMINOPHEN 325 MG PO TABS
650.0000 mg | ORAL_TABLET | Freq: Four times a day (QID) | ORAL | Status: DC | PRN
  Administered 2014-10-03 (×2): 650 mg via ORAL
  Filled 2014-09-25 (×2): qty 2

## 2014-09-25 MED ORDER — ACETAMINOPHEN 650 MG RE SUPP
650.0000 mg | Freq: Four times a day (QID) | RECTAL | Status: DC | PRN
  Administered 2014-10-04 (×3): 650 mg via RECTAL
  Filled 2014-09-25 (×3): qty 1

## 2014-09-25 MED ORDER — ENSURE COMPLETE PO LIQD
237.0000 mL | Freq: Every day | ORAL | Status: DC
  Administered 2014-10-01 – 2014-10-12 (×9): 237 mL via ORAL

## 2014-09-25 MED ORDER — TEMAZEPAM 15 MG PO CAPS: 15.0000 mg | ORAL_CAPSULE | Freq: Every evening | ORAL | Status: DC | PRN

## 2014-09-25 MED ORDER — LORAZEPAM 2 MG/ML IJ SOLN
1.0000 mg | INTRAMUSCULAR | Status: DC | PRN
  Administered 2014-09-25 – 2014-09-26 (×5): 1 mg via INTRAVENOUS
  Filled 2014-09-25 (×6): qty 1

## 2014-09-25 MED ORDER — SODIUM CHLORIDE 0.9 % IJ SOLN
3.0000 mL | Freq: Two times a day (BID) | INTRAMUSCULAR | Status: DC
  Administered 2014-09-25 – 2014-10-12 (×23): 3 mL via INTRAVENOUS

## 2014-09-25 NOTE — ED Notes (Signed)
PT very agitated and trying to get out of bed and pushing at staff. MD made aware.

## 2014-09-25 NOTE — Progress Notes (Signed)
Pt transferred to ICU from ED with altered mental status and hyperkalemia. Pt's VS are stable, he is oriented to self only, and is arousable to voice. Pt is resting comfortably with no complaints at this time. Will continue to monitor.

## 2014-09-25 NOTE — ED Provider Notes (Signed)
CSN: [id number]     Arrival date & time 09/25/14  0913 History   First MD Initiated Contact with Patient 09/25/14 1002     Chief Complaint  Patient presents with  . Hallucinations     (Consider location/radiation/quality/duration/timing/severity/associated sxs/prior Treatment) Patient is a 59 y.o. male presenting with altered mental status. The history is provided by the nursing home (the pt has been hitting himself and hallucinating at the nh).  Altered Mental Status Presenting symptoms: behavior changes   Severity:  Severe Most recent episode:  Today Episode history:  Continuous Timing:  Constant Progression:  Waxing and waning   Past Medical History  Diagnosis Date  . Aortic dissection   . Hypertension   . CVA (cerebral infarction)   . Aortic dissection   . Hypertension   . CVA (cerebral infarction)   . Diabetes mellitus   . Cardiomyopathy   . Hyperglycemia   . Bipolar 1 disorder   . Hyperkalemia   . Coronary artery disease   . Hyponatremia   . Altered mental status     with psychosis  . Dehydration   . TIA (transient ischemic attack)   . Lack of coordination   . Cardiomyopathy    Past Surgical History  Procedure Laterality Date  . Ascending aortic aneurysm repair     Family History  Problem Relation Age of Onset  . Alzheimer's disease Mother   . Stroke Father   . Heart disease Father   . Bipolar disorder Father   . Bipolar disorder Sister   . Bipolar disorder Sister    History  Substance Use Topics  . Smoking status: Never Smoker   . Smokeless tobacco: Not on file  . Alcohol Use: No    Review of Systems  Unable to perform ROS: Mental status change      Allergies  Review of patient's allergies indicates no known allergies.  Home Medications   Prior to Admission medications   Medication Sig Start Date End Date Taking? Authorizing Provider  amoxicillin (AMOXIL) 500 MG capsule Take 500 mg by mouth 3 (three) times daily.   Yes Historical  Provider, MD  aspirin 325 MG tablet Take 325 mg by mouth daily.     Yes Historical Provider, MD  baclofen (LIORESAL) 10 MG tablet Take 10 mg by mouth 3 (three) times daily.   Yes Historical Provider, MD  cloNIDine (CATAPRES) 0.3 MG tablet Take 0.3 mg by mouth daily.     Yes Historical Provider, MD  divalproex (DEPAKOTE SPRINKLE) 125 MG capsule Take 250 mg by mouth 3 (three) times daily.   Yes Historical Provider, MD  feeding supplement, ENSURE COMPLETE, (ENSURE COMPLETE) LIQD Take 237 mLs by mouth at bedtime.   Yes Historical Provider, MD  ibuprofen (ADVIL,MOTRIN) 800 MG tablet Take 800 mg by mouth every 6 (six) hours as needed for moderate pain.   Yes Historical Provider, MD  labetalol (NORMODYNE) 200 MG tablet Take 200 mg by mouth 2 (two) times daily.     Yes Historical Provider, MD  QUEtiapine (SEROQUEL) 200 MG tablet Take 200 mg by mouth 2 (two) times daily.   Yes Historical Provider, MD  ramipril (ALTACE) 10 MG tablet Take 10 mg by mouth daily.     Yes Historical Provider, MD  simvastatin (ZOCOR) 20 MG tablet Take 20 mg by mouth at bedtime.    Yes Historical Provider, MD  trihexyphenidyl (ARTANE) 5 MG tablet Take 5 mg by mouth 2 (two) times daily with a meal.  Yes Historical Provider, MD  amoxicillin-clavulanate (AUGMENTIN) 875-125 MG per tablet Take 1 tablet by mouth every 12 (twelve) hours. Last dose November 16 in the evening. Patient not taking: Reported on 09/25/2014 07/16/14   [name], MD  fluticasone [facility]) 50 MCG/ACT nasal spray Place 2 sprays into the nose daily as needed. For allergies    Historical Provider, MD  risperiDONE (RISPERDAL) 1 MG tablet Take 1 tablet (1 mg total) by mouth 3 (three) times daily. 06/05/13 06/05/14  [name], MD  temazepam (RESTORIL) 15 MG capsule Take 15 mg by mouth at bedtime as needed for sleep.     Historical Provider, MD   BP 112/60 mmHg  Pulse 71  Temp(Src) 97.9 F (36.6 C) (Oral)  Resp 15  Ht 5\' 10"  (1.778 m)  Wt 150 lb (68.04 kg)   BMI 21.52 kg/m2  SpO2 97% Physical Exam  Constitutional: He appears well-developed.  HENT:  Head: Normocephalic.  Eyes: Conjunctivae and EOM are normal. No scleral icterus.  Neck: Neck supple. No thyromegaly present.  Cardiovascular: Normal rate and regular rhythm.  Exam reveals no gallop and no friction rub.   No murmur heard. Pulmonary/Chest: No stridor. He has no wheezes. He has no rales. He exhibits no tenderness.  Abdominal: He exhibits no distension. There is no tenderness. There is no rebound.  Musculoskeletal: Normal range of motion. He exhibits no edema.  Lymphadenopathy:    He has no cervical adenopathy.  Neurological: He is alert. He exhibits normal muscle tone. Coordination normal.  Skin: No rash noted. No erythema.  Psychiatric:  Pt alert and fighting staff.  Pt was given geodon to calm him down    ED Course  Procedures (including critical care time) Labs Review Labs Reviewed  CBC WITH DIFFERENTIAL - Abnormal; Notable for the following:    RBC 3.96 (*)    Hemoglobin 12.2 (*)    HCT 36.6 (*)    Platelets 102 (*)    All other components within normal limits  COMPREHENSIVE METABOLIC PANEL - Abnormal; Notable for the following:    Sodium 150 (*)    Potassium 6.5 (*)    Chloride 115 (*)    Glucose, Bld 102 (*)    BUN 53 (*)    Creatinine, Ser 2.36 (*)    GFR calc non Af Amer 29 (*)    GFR calc Af Amer 34 (*)    All other components within normal limits  CBG MONITORING, ED - Abnormal; Notable for the following:    Glucose-Capillary 28 (*)    All other components within normal limits  ETHANOL  URINE RAPID DRUG SCREEN (HOSP PERFORMED)  URINALYSIS, ROUTINE W REFLEX MICROSCOPIC  POTASSIUM  VALPROIC ACID LEVEL  CBG MONITORING, ED  CBG MONITORING, ED    Imaging Review No results found.   EKG Interpretation   Date/Time:  Friday September 25 2014 11:51:11 EST Ventricular Rate:  81 PR Interval:  205 QRS Duration: 106 QT Interval:  385 QTC Calculation:  447 R Axis:   -41 Text Interpretation:  Sinus rhythm Borderline prolonged PR interval Left  axis deviation Confirmed by Carlette Palmatier  MD, Jayse Hodkinson [phone]) on 09/25/2014  12:57:53 PM     CRITICAL CARE Performed by: Nakeesha Bowler L Total critical care time: 40 Critical care time was exclusive of separately billable procedures and treating other patients. Critical care was necessary to treat or prevent imminent or life-threatening deterioration. Critical care was time spent personally by me on the following activities: development of treatment plan  with patient and/or surrogate as well as nursing, discussions with consultants, evaluation of patient's response to treatment, examination of patient, obtaining history from patient or surrogate, ordering and performing treatments and interventions, ordering and review of laboratory studies, ordering and review of radiographic studies, pulse oximetry and re-evaluation of patient's condition.   MDM   Final diagnoses:  Hypernatremia  Hyperkalemia    Admit,  Hyperkalemia and hypernatremia    Maudry Diego, MD 09/25/14 1359

## 2014-09-25 NOTE — ED Notes (Signed)
Ativan held at this time d/t geodon injection effective. PT resting at this time.

## 2014-09-25 NOTE — Progress Notes (Addendum)
Pt is extremely agitated and has now become combative with tactile stimulation despite receiving IV ativan. RASS Score of +4 with any tactile stimulation. Pt will not keep arm straight to allow IVF to infuse and will not allow staff to wrap arm or attempt new IV access. MD is aware and he ordered nursing to encourage PO intake of fluids and to do BP checks Q 4-8 hours. Will continue to monitor.

## 2014-09-25 NOTE — ED Notes (Addendum)
Pt sent from [place], staff reports pt hitting himself and hearing voices.  Pt reports "I was trying to get some attention."

## 2014-09-25 NOTE — ED Notes (Signed)
PT moving vigorously in the bed and this is an abnormal behavior per the nursing home. Per nursing home patient is ambulatory independently and does his own ADLs.

## 2014-09-25 NOTE — H&P (Addendum)
History and Physical  Derrick Butler BJS:[id number] DOB: [birth year] DOA: 09/25/2014  Referring physician: Dr. Roderic Palau PCP: Rosita Fire, MD   Chief Complaint: combative   HPI:  59 year old man presented to ED from [place] for h/o hallucinations and combativeness. In ED found to have acute renal failure with acute hyperkalemia as well as significant combativeness requiring Ativan and Geodan.   Discussed with RN at facility, April. Patient has chronic cognitive deficits but at baseline feeds himself, is continent, walks unassisted without device, needs assistance dressing. He was doing well until this morning when he developed hallucinations and combativeness, hitting his head against a wall. He was very difficult to redirect. He has had no fever or systemic symptoms noted.  Patient seems to feel well. Because of his cognitive deficits he does not seem to be healed provide any history. He denies hallucinations now and denies complaints. History is very limited.  In the emergency department afebrile, vital signs stable. No hypoxia.potassium 6.5, glucose 28-42, BUN 53, creatinine 2.36 improved from January 2. Sodium 150.CBC was stable with anemia and thrombocytopenia. Valproic acid level was subtherapeutic. Patient was significantly combative requiring Ativan and Geodon.  Review of Systems:  According to the patient: Negative for fever, visual changes, sore throat, rash, new muscle aches, chest pain, SOB, dysuria, bleeding, n/v/abdominal pain.  Past Medical History  Diagnosis Date  . Aortic dissection   . Hypertension   . CVA (cerebral infarction)   . Aortic dissection   . Hypertension   . CVA (cerebral infarction)   . Diabetes mellitus   . Cardiomyopathy   . Hyperglycemia   . Bipolar 1 disorder   . Hyperkalemia   . Coronary artery disease   . Hyponatremia   . Altered mental status     with psychosis  . Dehydration   . TIA (transient ischemic attack)   . Lack of coordination   .  Cardiomyopathy     Past Surgical History  Procedure Laterality Date  . Ascending aortic aneurysm repair      Social History:  reports that he has never smoked. He does not have any smokeless tobacco history on file. He reports that he does not drink alcohol or use illicit drugs.  No Known Allergies  Family History  Problem Relation Age of Onset  . Alzheimer's disease Mother   . Stroke Father   . Heart disease Father   . Bipolar disorder Father   . Bipolar disorder Sister   . Bipolar disorder Sister      Prior to Admission medications   Medication Sig Start Date End Date Taking? Authorizing Provider  amoxicillin (AMOXIL) 500 MG capsule Take 500 mg by mouth 3 (three) times daily.   Yes Historical Provider, MD  aspirin 325 MG tablet Take 325 mg by mouth daily.     Yes Historical Provider, MD  baclofen (LIORESAL) 10 MG tablet Take 10 mg by mouth 3 (three) times daily.   Yes Historical Provider, MD  cloNIDine (CATAPRES) 0.3 MG tablet Take 0.3 mg by mouth daily.     Yes Historical Provider, MD  divalproex (DEPAKOTE SPRINKLE) 125 MG capsule Take 250 mg by mouth 3 (three) times daily.   Yes Historical Provider, MD  feeding supplement, ENSURE COMPLETE, (ENSURE COMPLETE) LIQD Take 237 mLs by mouth at bedtime.   Yes Historical Provider, MD  ibuprofen (ADVIL,MOTRIN) 800 MG tablet Take 800 mg by mouth every 6 (six) hours as needed for moderate pain.   Yes Historical Provider, MD  labetalol (NORMODYNE) 200  MG tablet Take 200 mg by mouth 2 (two) times daily.     Yes Historical Provider, MD  QUEtiapine (SEROQUEL) 200 MG tablet Take 200 mg by mouth 2 (two) times daily.   Yes Historical Provider, MD  ramipril (ALTACE) 10 MG tablet Take 10 mg by mouth daily.     Yes Historical Provider, MD  simvastatin (ZOCOR) 20 MG tablet Take 20 mg by mouth at bedtime.    Yes Historical Provider, MD  trihexyphenidyl (ARTANE) 5 MG tablet Take 5 mg by mouth 2 (two) times daily with a meal.   Yes Historical Provider,  MD  amoxicillin-clavulanate (AUGMENTIN) 875-125 MG per tablet Take 1 tablet by mouth every 12 (twelve) hours. Last dose November 16 in the evening. Patient not taking: Reported on 09/25/2014 07/16/14   [name], MD  fluticasone [place]) 50 MCG/ACT nasal spray Place 2 sprays into the nose daily as needed. For allergies    Historical Provider, MD  risperiDONE (RISPERDAL) 1 MG tablet Take 1 tablet (1 mg total) by mouth 3 (three) times daily. 06/05/13 06/05/14  [name], MD  temazepam (RESTORIL) 15 MG capsule Take 15 mg by mouth at bedtime as needed for sleep.     Historical Provider, MD   Physical Exam: Filed Vitals:   09/25/14 1300 09/25/14 1330 09/25/14 1400 09/25/14 1420  BP: 112/60  129/64 128/71  Pulse: 71 71 66 68  Temp:      TempSrc:      Resp: 19 15 19 18   Height:      Weight:      SpO2: 93% 97% 99% 98%    General:  Appears calm and comfortable Eyes: Pupils, irises, lids appear grossly unremarkable ENT: grossly normal hearing, lips & tongue Cardiovascular: RRR, no m/r/g. No LE edema. Respiratory: CTA bilaterally, no w/r/r. Normal respiratory effort. Abdomen: soft, ntnd Skin: no rash or induration seen Musculoskeletal: grossly normal tone BUE/BLE. Abnormal head movements noted. Psychiatric: grossly normal mood and affect, speech fluent and appropriate Neurologic: Grossly unremarkable, as above involuntary movements noted. Moves lower extremities to command.  Wt Readings from Last 3 Encounters:  09/25/14 68.04 kg (150 lb)  07/14/14 69.2 kg (152 lb 8.9 oz)  09/07/13 56.8 kg (125 lb 3.5 oz)    Labs on Admission:  Basic Metabolic Panel:  Recent Labs Lab 09/25/14 1056 09/25/14 1322  NA 150*  --   K 6.5* 4.4  CL 115*  --   CO2 27  --   GLUCOSE 102*  --   BUN 53*  --   CREATININE 2.36*  --   CALCIUM 10.1  --     Liver Function Tests:  Recent Labs Lab 09/25/14 1056  AST 32  ALT 28  ALKPHOS 71  BILITOT 0.7  PROT 6.7  ALBUMIN 4.0     CBC:  Recent Labs Lab 09/25/14 1056  WBC 5.5  NEUTROABS 3.0  HGB 12.2*  HCT 36.6*  MCV 92.4  PLT 102*    CBG:  Recent Labs Lab 09/25/14 1103 09/25/14 1327 09/25/14 1421  GLUCAP 99 28* 42*    EKG: Independently reviewed. Sinus rhythm, no acute changes   Principal Problem:   Hyperkalemia Active Problems:   Bipolar I disorder, most recent episode mixed   H/O: stroke   Dehydration   CVA, old, cognitive deficits   CKD (chronic kidney disease) stage 3, GFR 30-59 ml/min   Thrombocytopenia   Acute encephalopathy   Acute renal failure   Hypoglycemia   Assessment/Plan 1. Hyperkalemia,  improved with treatment in the emergency department, potassium now 4.4. Received insulin, calcium, D50. Possibly related to ramipril, dehydration. 2. Acute renal failure superimposed on chronic kidney disease stage III with baseline perhaps approximately 1. 8-2. Hold ramipril. Not on any diuretics but apparently on ibuprofen. Consider ibuprofen induced. 3. Acute encephalopathy with hallucinations possibly metabolic in nature related to renal failure and hyperkalemia. 4. Dehydration. 5. Diabetes mellitus with hypoglycemia secondary to IV insulin for hyperkalemia, currently corrected 6. Chronic thrombocytopenia, stable 7. Chronic normocytic anemia, stable 8. Bipolar 1 disorder, cognitive deficit, chronic involuntary limb movement disorder. 9. History of stroke   History very limited but the patient appears clinically stable. His movement disorder is chronic as is cognitive dysfunction. He has no signs or symptoms to suggest infection or ACS.  Plan IV hydration, repeat basic metabolic panel in the morning now that his potassium has normalized.  Follow blood sugars closely.  Hold ACE inhibitor and ibuprofen.  Check urinalysis, CBC in the morning  Code Status: full code DVT prophylaxis: SCDs Family Communication: none Disposition Plan/Anticipated LOS: admit 2 days  Time spent:  50 minutes  Addendum 1900 Patient with intermittent agitation. VSS. On exam poorly compliant but moves all extremities well, non-focal neurologic exam but limitied. Briefly redirectable, responds "I'm ok." Want some Coke? "Yes, that's my favorite". Sips Coke without difficulty. CBG have been stable. Discussed with HCPOA by telephone [name]. She reports patient suffers from intermittent hallucinations. At this point plan sedation, IVF, chronic medications. No signs of CNS event of infection. Obtain urine when possible.   Murray Hodgkins, MD  [facility] Pager [phone] 09/25/2014, 2:32 PM

## 2014-09-25 NOTE — Progress Notes (Signed)
Patient continued to be combative, 5 mg IV Haldol lasted about 50minutes.  On exam, both pupils were dilated, but reactive.  He moves all 4 extremities, and grimace with symmetry. Trashing around the bed and rocking his head.  His vitals were OK, and his Sat were 97 percent. Will start a Versed drip.  Changes his meds to IV.  Foley to be placed.  Will continue to monitor.

## 2014-09-25 NOTE — ED Notes (Signed)
CRITICAL VALUE ALERT  Critical value received:  Potassium 6.5 Date of notification: 09/25/14  Time of notification:  1138  Critical value read back: yes  Nurse who received alert:  Ilda Mori  MD notified (1st page):  zammit  Time of first page:  1138  MD notified (2nd page):  Time of second page:  Responding MD: [email or web address]  Time MD responded: 1139

## 2014-09-25 NOTE — ED Notes (Signed)
MD at bedside. 

## 2014-09-26 ENCOUNTER — Inpatient Hospital Stay (HOSPITAL_COMMUNITY): Payer: Medicare Other

## 2014-09-26 LAB — BASIC METABOLIC PANEL
ANION GAP: 4 — AB (ref 5–15)
BUN: 38 mg/dL — ABNORMAL HIGH (ref 6–23)
CALCIUM: 8.5 mg/dL (ref 8.4–10.5)
CO2: 27 mmol/L (ref 19–32)
CREATININE: 1.76 mg/dL — AB (ref 0.50–1.35)
Chloride: 111 mmol/L (ref 96–112)
GFR calc Af Amer: 48 mL/min — ABNORMAL LOW (ref >90)
GFR, EST NON AFRICAN AMERICAN: 41 mL/min — AB (ref >90)
Glucose, Bld: 130 mg/dL — ABNORMAL HIGH (ref 70–99)
Potassium: 4.3 mmol/L (ref 3.5–5.1)
SODIUM: 142 mmol/L (ref 135–145)

## 2014-09-26 LAB — GLUCOSE, CAPILLARY
GLUCOSE-CAPILLARY: 119 mg/dL — AB (ref 70–99)
GLUCOSE-CAPILLARY: 118 mg/dL — AB (ref 70–99)
GLUCOSE-CAPILLARY: 106 mg/dL — AB (ref 70–99)
Glucose-Capillary: 121 mg/dL — ABNORMAL HIGH (ref 70–99)
Glucose-Capillary: 121 mg/dL — ABNORMAL HIGH (ref 70–99)
Glucose-Capillary: 128 mg/dL — ABNORMAL HIGH (ref 70–99)
Glucose-Capillary: 104 mg/dL — ABNORMAL HIGH (ref 70–99)

## 2014-09-26 LAB — CBC
HEMATOCRIT: 33.2 % — AB (ref 39.0–52.0)
Hemoglobin: 11.3 g/dL — ABNORMAL LOW (ref 13.0–17.0)
MCH: 30.9 pg (ref 26.0–34.0)
MCHC: 34 g/dL (ref 30.0–36.0)
MCV: 90.7 fL (ref 78.0–100.0)
Platelets: 79 10*3/uL — ABNORMAL LOW (ref 150–400)
RBC: 3.66 MIL/uL — AB (ref 4.22–5.81)
RDW: 12.6 % (ref 11.5–15.5)
WBC: 4.2 10*3/uL (ref 4.0–10.5)

## 2014-09-26 MED ORDER — VALPROATE SODIUM 500 MG/5ML IV SOLN
INTRAVENOUS | Status: AC
  Filled 2014-09-26: qty 5

## 2014-09-26 MED ORDER — CETYLPYRIDINIUM CHLORIDE 0.05 % MT LIQD
7.0000 mL | Freq: Two times a day (BID) | OROMUCOSAL | Status: DC
  Administered 2014-09-26 – 2014-10-13 (×31): 7 mL via OROMUCOSAL

## 2014-09-26 MED ORDER — DIPHENHYDRAMINE HCL 50 MG/ML IJ SOLN
50.0000 mg | Freq: Four times a day (QID) | INTRAMUSCULAR | Status: DC
  Administered 2014-09-26: 50 mg via INTRAVENOUS
  Filled 2014-09-26: qty 1

## 2014-09-26 MED ORDER — BENZTROPINE MESYLATE 1 MG/ML IJ SOLN
0.5000 mg | Freq: Two times a day (BID) | INTRAMUSCULAR | Status: DC
  Administered 2014-09-26: 0.5 mg via INTRAVENOUS
  Filled 2014-09-26 (×4): qty 0.5

## 2014-09-26 MED ORDER — MIDAZOLAM HCL 5 MG/ML IJ SOLN
1.0000 mg/h | INTRAMUSCULAR | Status: DC
  Administered 2014-09-26: 0.5 mg/h via INTRAVENOUS
  Filled 2014-09-26: qty 10

## 2014-09-26 MED ORDER — MUPIROCIN 2 % EX OINT
1.0000 "application " | TOPICAL_OINTMENT | Freq: Two times a day (BID) | CUTANEOUS | Status: AC
  Administered 2014-09-26 – 2014-10-01 (×10): 1 via NASAL
  Filled 2014-09-26 (×2): qty 22

## 2014-09-26 MED ORDER — MIDAZOLAM HCL 50 MG/10ML IJ SOLN
INTRAMUSCULAR | Status: AC
  Filled 2014-09-26: qty 1

## 2014-09-26 MED ORDER — MIDAZOLAM BOLUS VIA INFUSION
2.0000 mg | INTRAVENOUS | Status: DC | PRN
  Administered 2014-09-26 – 2014-09-27 (×5): 2 mg via INTRAVENOUS
  Filled 2014-09-26 (×6): qty 2

## 2014-09-26 MED ORDER — LORAZEPAM 2 MG/ML IJ SOLN: 1.0000 mg | INTRAMUSCULAR | Status: DC | PRN

## 2014-09-26 MED ORDER — BENZTROPINE MESYLATE 1 MG/ML IJ SOLN
INTRAMUSCULAR | Status: AC
  Filled 2014-09-26: qty 2

## 2014-09-26 MED ORDER — CHLORHEXIDINE GLUCONATE CLOTH 2 % EX PADS
6.0000 | MEDICATED_PAD | Freq: Every day | CUTANEOUS | Status: AC
  Administered 2014-09-27 – 2014-09-30 (×4): 6 via TOPICAL

## 2014-09-26 MED ORDER — LORAZEPAM 2 MG/ML IJ SOLN
1.0000 mg | Freq: Once | INTRAMUSCULAR | Status: AC
  Administered 2014-09-26: 1 mg via INTRAVENOUS
  Filled 2014-09-26: qty 1

## 2014-09-26 MED ORDER — MIDAZOLAM HCL 2 MG/2ML IJ SOLN
2.0000 mg | INTRAMUSCULAR | Status: DC | PRN
  Administered 2014-09-25: 1 mg via INTRAVENOUS
  Administered 2014-09-26 (×6): 2 mg via INTRAVENOUS

## 2014-09-26 MED ORDER — LORAZEPAM 2 MG/ML IJ SOLN
2.0000 mg | INTRAMUSCULAR | Status: DC | PRN
  Administered 2014-09-26 – 2014-09-28 (×7): 2 mg via INTRAVENOUS
  Filled 2014-09-26 (×9): qty 1

## 2014-09-26 MED ORDER — MIDAZOLAM HCL 2 MG/2ML IJ SOLN: 2.0000 mg | INTRAMUSCULAR | Status: DC | PRN

## 2014-09-26 MED ORDER — BENZTROPINE MESYLATE 1 MG/ML IJ SOLN
0.5000 mg | Freq: Two times a day (BID) | INTRAMUSCULAR | Status: DC
  Filled 2014-09-26 (×4): qty 0.5

## 2014-09-26 NOTE — Progress Notes (Signed)
Subjective: This is a patient of Dr. Josephine Cables who was admitted with altered mental status. He became extremely combative and has been placed on a Versed drip. He is now home but he is still on the Versed drip.  Objective: Vital signs in last 24 hours: Temp:  [97.3 F (36.3 C)-98.1 F (36.7 C)] 97.3 F (36.3 C) (01/23 0451) Pulse Rate:  [55-108] 62 (01/23 0730) Resp:  [12-38] 38 (01/23 0730) BP: (107-181)/(49-118) 122/70 mmHg (01/23 0730) SpO2:  [93 %-100 %] 99 % (01/23 0730) Weight:  [59.2 kg (130 lb 8.2 oz)-68.04 kg (150 lb)] 59.2 kg (130 lb 8.2 oz) (01/23 0451) Weight change:  Last BM Date:  (unknown)  Intake/Output from previous day: 01/22 0701 - 01/23 0700 In: 1632.1 [P.O.:350; I.V.:1177.1; IV Piggyback:105] Out: 650 [Urine:650]  PHYSICAL EXAM General appearance: Sleepy resting comfortably and arousable Resp: rhonchi bilaterally Cardio: regular rate and rhythm, S1, S2 normal, no murmur, click, rub or gallop GI: soft, non-tender; bowel sounds normal; no masses,  no organomegaly Extremities: extremities normal, atraumatic, no cyanosis or edema  Lab Results:  Results for orders placed or performed during the hospital encounter of 09/25/14 (from the past 48 hour(s))  CBC with Differential     Status: Abnormal   Collection Time: 09/25/14 10:56 AM  Result Value Ref Range   WBC 5.5 4.0 - 10.5 K/uL   RBC 3.96 (L) 4.22 - 5.81 MIL/uL   Hemoglobin 12.2 (L) 13.0 - 17.0 g/dL   HCT 36.6 (L) 39.0 - 52.0 %   MCV 92.4 78.0 - 100.0 fL   MCH 30.8 26.0 - 34.0 pg   MCHC 33.3 30.0 - 36.0 g/dL   RDW 12.6 11.5 - 15.5 %   Platelets 102 (L) 150 - 400 K/uL    Comment: SPECIMEN CHECKED FOR CLOTS PLATELET COUNT CONFIRMED BY SMEAR    Neutrophils Relative % 55 43 - 77 %   Neutro Abs 3.0 1.7 - 7.7 K/uL   Lymphocytes Relative 36 12 - 46 %   Lymphs Abs 2.0 0.7 - 4.0 K/uL   Monocytes Relative 6 3 - 12 %   Monocytes Absolute 0.3 0.1 - 1.0 K/uL   Eosinophils Relative 2 0 - 5 %   Eosinophils  Absolute 0.1 0.0 - 0.7 K/uL   Basophils Relative 1 0 - 1 %   Basophils Absolute 0.0 0.0 - 0.1 K/uL  Comprehensive metabolic panel     Status: Abnormal   Collection Time: 09/25/14 10:56 AM  Result Value Ref Range   Sodium 150 (H) 135 - 145 mmol/L   Potassium 6.5 (HH) 3.5 - 5.1 mmol/L    Comment: CRITICAL RESULT CALLED TO, READ BACK BY AND VERIFIED WITH: YOUNG,J AT 11:30AM ON 09/25/14 BY FESTERMAN,C    Chloride 115 (H) 96 - 112 mmol/L   CO2 27 19 - 32 mmol/L   Glucose, Bld 102 (H) 70 - 99 mg/dL   BUN 53 (H) 6 - 23 mg/dL   Creatinine, Ser 2.36 (H) 0.50 - 1.35 mg/dL   Calcium 10.1 8.4 - 10.5 mg/dL   Total Protein 6.7 6.0 - 8.3 g/dL   Albumin 4.0 3.5 - 5.2 g/dL   AST 32 0 - 37 U/L   ALT 28 0 - 53 U/L   Alkaline Phosphatase 71 39 - 117 U/L   Total Bilirubin 0.7 0.3 - 1.2 mg/dL   GFR calc non Af Amer 29 (L) >90 mL/min   GFR calc Af Amer 34 (L) >90 mL/min    Comment: (NOTE)  The eGFR has been calculated using the CKD EPI equation. This calculation has not been validated in all clinical situations. eGFR's persistently <90 mL/min signify possible Chronic Kidney Disease.    Anion gap 8 5 - 15  Ethanol     Status: None   Collection Time: 09/25/14 10:56 AM  Result Value Ref Range   Alcohol, Ethyl (B) <5 0 - 9 mg/dL    Comment:        LOWEST DETECTABLE LIMIT FOR SERUM ALCOHOL IS 11 mg/dL FOR MEDICAL PURPOSES ONLY   CBG monitoring, ED     Status: None   Collection Time: 09/25/14 11:03 AM  Result Value Ref Range   Glucose-Capillary 99 70 - 99 mg/dL  Potassium     Status: None   Collection Time: 09/25/14  1:22 PM  Result Value Ref Range   Potassium 4.4 3.5 - 5.1 mmol/L    Comment: DELTA CHECK NOTED  Valproic acid level     Status: Abnormal   Collection Time: 09/25/14  1:22 PM  Result Value Ref Range   Valproic Acid Lvl 27.3 (L) 50.0 - 100.0 ug/mL  POC CBG, ED     Status: Abnormal   Collection Time: 09/25/14  1:27 PM  Result Value Ref Range   Glucose-Capillary 28 (LL) 70 - 99  mg/dL   Comment 1 Notify RN   POC CBG, ED     Status: Abnormal   Collection Time: 09/25/14  2:21 PM  Result Value Ref Range   Glucose-Capillary 42 (LL) 70 - 99 mg/dL  CBG monitoring, ED     Status: Abnormal   Collection Time: 09/25/14  2:47 PM  Result Value Ref Range   Glucose-Capillary 143 (H) 70 - 99 mg/dL   Comment 1 Documented in Chart    Comment 2 Notify RN   MRSA PCR Screening     Status: Abnormal   Collection Time: 09/25/14  2:57 PM  Result Value Ref Range   MRSA by PCR POSITIVE (A) NEGATIVE    Comment:        The GeneXpert MRSA Assay (FDA approved for NASAL specimens only), is one component of a comprehensive MRSA colonization surveillance program. It is not intended to diagnose MRSA infection nor to guide or monitor treatment for MRSA infections. RESULT CALLED TO, READ BACK BY AND VERIFIED WITH: NIELSON,T ON 09/25/14 AT 2105 BY LOY,C   Glucose, capillary     Status: Abnormal   Collection Time: 09/25/14  4:11 PM  Result Value Ref Range   Glucose-Capillary 65 (L) 70 - 99 mg/dL   Comment 1 Notify RN   Glucose, capillary     Status: Abnormal   Collection Time: 09/25/14  4:25 PM  Result Value Ref Range   Glucose-Capillary 144 (H) 70 - 99 mg/dL  Glucose, capillary     Status: None   Collection Time: 09/25/14  5:44 PM  Result Value Ref Range   Glucose-Capillary 98 70 - 99 mg/dL  Glucose, capillary     Status: None   Collection Time: 09/25/14  6:36 PM  Result Value Ref Range   Glucose-Capillary 84 70 - 99 mg/dL   Comment 1 Notify RN   Urine rapid drug screen (hosp performed)     Status: None   Collection Time: 09/25/14  7:09 PM  Result Value Ref Range   Opiates NONE DETECTED NONE DETECTED   Cocaine NONE DETECTED NONE DETECTED   Benzodiazepines NONE DETECTED NONE DETECTED   Amphetamines NONE DETECTED NONE DETECTED  Tetrahydrocannabinol NONE DETECTED NONE DETECTED   Barbiturates NONE DETECTED NONE DETECTED    Comment:        DRUG SCREEN FOR MEDICAL  PURPOSES ONLY.  IF CONFIRMATION IS NEEDED FOR ANY PURPOSE, NOTIFY LAB WITHIN 5 DAYS.        LOWEST DETECTABLE LIMITS FOR URINE DRUG SCREEN Drug Class       Cutoff (ng/mL) Amphetamine      1000 Barbiturate      200 Benzodiazepine   935 Tricyclics       701 Opiates          300 Cocaine          300 THC              50   Urinalysis, Routine w reflex microscopic     Status: None   Collection Time: 09/25/14  7:09 PM  Result Value Ref Range   Color, Urine YELLOW YELLOW   APPearance CLEAR CLEAR   Specific Gravity, Urine 1.025 1.005 - 1.030   pH 5.5 5.0 - 8.0   Glucose, UA NEGATIVE NEGATIVE mg/dL   Hgb urine dipstick NEGATIVE NEGATIVE   Bilirubin Urine NEGATIVE NEGATIVE   Ketones, ur NEGATIVE NEGATIVE mg/dL   Protein, ur NEGATIVE NEGATIVE mg/dL   Urobilinogen, UA 0.2 0.0 - 1.0 mg/dL   Nitrite NEGATIVE NEGATIVE   Leukocytes, UA NEGATIVE NEGATIVE    Comment: MICROSCOPIC NOT DONE ON URINES WITH NEGATIVE PROTEIN, BLOOD, LEUKOCYTES, NITRITE, OR GLUCOSE <1000 mg/dL.  Glucose, capillary     Status: Abnormal   Collection Time: 09/25/14  8:53 PM  Result Value Ref Range   Glucose-Capillary 117 (H) 70 - 99 mg/dL   Comment 1 Notify RN   Glucose, capillary     Status: Abnormal   Collection Time: 09/26/14  7:50 AM  Result Value Ref Range   Glucose-Capillary 119 (H) 70 - 99 mg/dL  Basic metabolic panel     Status: Abnormal   Collection Time: 09/26/14  9:17 AM  Result Value Ref Range   Sodium 142 135 - 145 mmol/L    Comment: DELTA CHECK NOTED   Potassium 4.3 3.5 - 5.1 mmol/L   Chloride 111 96 - 112 mmol/L   CO2 27 19 - 32 mmol/L   Glucose, Bld 130 (H) 70 - 99 mg/dL   BUN 38 (H) 6 - 23 mg/dL   Creatinine, Ser 1.76 (H) 0.50 - 1.35 mg/dL   Calcium 8.5 8.4 - 10.5 mg/dL   GFR calc non Af Amer 41 (L) >90 mL/min   GFR calc Af Amer 48 (L) >90 mL/min    Comment: (NOTE) The eGFR has been calculated using the CKD EPI equation. This calculation has not been validated in all clinical  situations. eGFR's persistently <90 mL/min signify possible Chronic Kidney Disease.    Anion gap 4 (L) 5 - 15  CBC     Status: Abnormal   Collection Time: 09/26/14  9:17 AM  Result Value Ref Range   WBC 4.2 4.0 - 10.5 K/uL   RBC 3.66 (L) 4.22 - 5.81 MIL/uL   Hemoglobin 11.3 (L) 13.0 - 17.0 g/dL   HCT 33.2 (L) 39.0 - 52.0 %   MCV 90.7 78.0 - 100.0 fL   MCH 30.9 26.0 - 34.0 pg   MCHC 34.0 30.0 - 36.0 g/dL   RDW 12.6 11.5 - 15.5 %   Platelets 79 (L) 150 - 400 K/uL    Comment: SPECIMEN CHECKED FOR CLOTS PLATELETS APPEAR DECREASED PLATELET COUNT  CONFIRMED BY SMEAR   Glucose, capillary     Status: Abnormal   Collection Time: 09/26/14  9:47 AM  Result Value Ref Range   Glucose-Capillary 118 (H) 70 - 99 mg/dL   Comment 1 Notify RN     ABGS No results for input(s): PHART, PO2ART, TCO2, HCO3 in the last 72 hours.  Invalid input(s): PCO2 CULTURES Recent Results (from the past 240 hour(s))  MRSA PCR Screening     Status: Abnormal   Collection Time: 09/25/14  2:57 PM  Result Value Ref Range Status   MRSA by PCR POSITIVE (A) NEGATIVE Final    Comment:        The GeneXpert MRSA Assay (FDA approved for NASAL specimens only), is one component of a comprehensive MRSA colonization surveillance program. It is not intended to diagnose MRSA infection nor to guide or monitor treatment for MRSA infections. RESULT CALLED TO, READ BACK BY AND VERIFIED WITH: NIELSON,T ON 09/25/14 AT 2105 BY LOY,C    Studies/Results: No results found.  Medications:  Prior to Admission:  Prescriptions prior to admission  Medication Sig Dispense Refill Last Dose  . amoxicillin (AMOXIL) 500 MG capsule Take 500 mg by mouth 3 (three) times daily.   09/24/2014 at 2000  . aspirin 325 MG tablet Take 325 mg by mouth daily.     09/24/2014 at Unknown time  . baclofen (LIORESAL) 10 MG tablet Take 10 mg by mouth 3 (three) times daily.   09/24/2014 at 2000  . cloNIDine (CATAPRES) 0.3 MG tablet Take 0.3 mg by mouth  daily.     09/24/2014 at Unknown time  . divalproex (DEPAKOTE SPRINKLE) 125 MG capsule Take 250 mg by mouth 3 (three) times daily.   09/24/2014 at 2000  . feeding supplement, ENSURE COMPLETE, (ENSURE COMPLETE) LIQD Take 237 mLs by mouth at bedtime.   09/24/2014 at 2000  . ibuprofen (ADVIL,MOTRIN) 800 MG tablet Take 800 mg by mouth every 6 (six) hours as needed for moderate pain.   unknown  . labetalol (NORMODYNE) 200 MG tablet Take 200 mg by mouth 2 (two) times daily.     09/24/2014 at 2000  . QUEtiapine (SEROQUEL) 200 MG tablet Take 200 mg by mouth 2 (two) times daily.   09/24/2014 at 2000  . ramipril (ALTACE) 10 MG tablet Take 10 mg by mouth daily.     09/24/2014 at 0800  . simvastatin (ZOCOR) 20 MG tablet Take 20 mg by mouth at bedtime.    09/24/2014 at 2000  . trihexyphenidyl (ARTANE) 5 MG tablet Take 5 mg by mouth 2 (two) times daily with a meal.   09/24/2014 at 2000  . amoxicillin-clavulanate (AUGMENTIN) 875-125 MG per tablet Take 1 tablet by mouth every 12 (twelve) hours. Last dose November 16 in the evening. (Patient not taking: Reported on 09/25/2014)     . fluticasone (FLONASE) 50 MCG/ACT nasal spray Place 2 sprays into the nose daily as needed. For allergies   unknown  . risperiDONE (RISPERDAL) 1 MG tablet Take 1 tablet (1 mg total) by mouth 3 (three) times daily. 90 tablet 2 09/06/2013 at 0800  . temazepam (RESTORIL) 15 MG capsule Take 15 mg by mouth at bedtime as needed for sleep.    unknown   Scheduled: . antiseptic oral rinse  7 mL Mouth Rinse BID  . baclofen  10 mg Oral TID  . feeding supplement (ENSURE COMPLETE)  237 mL Oral QHS  . QUEtiapine  200 mg Oral BID  . simvastatin  20 mg  Oral QHS  . sodium chloride  3 mL Intravenous Q12H  . trihexyphenidyl  5 mg Oral BID WC  . valproate sodium  250 mg Intravenous Q8H   Continuous: . dextrose 5 % and 0.45% NaCl 125 mL/hr at 09/26/14 0700  . midazolam (VERSED) infusion 1 mg/hr (09/26/14 0946)   MGN:[id number] **OR** acetaminophen,  labetalol, LORazepam, midazolam  Assesment: He came in with altered mental status and was combative. He has acute on chronic renal failure with hyperkalemia that he is improved. Basic metabolic profile from this morning is pending. He has bipolar disease and previous stroke. He has required Versed drip to get him calm Principal Problem:   Hyperkalemia Active Problems:   Bipolar I disorder, most recent episode mixed   H/O: stroke   Dehydration   CVA, old, cognitive deficits   CKD (chronic kidney disease) stage 3, GFR 30-59 ml/min   Thrombocytopenia   Acute encephalopathy   Acute renal failure   Hypoglycemia    Plan: While he's on the Versed drip on one and get a CT of the brain continue other treatments.    LOS: 1 day   Derrick Butler L 09/26/2014, 10:08 AM

## 2014-09-26 NOTE — Progress Notes (Addendum)
Patient remain calm on versed drip at 0.5 mg/hr throughout the shift, but easily arousable in which his heart rate increased immediately.  Patient had episodes of thrashing his head and body at times and versed boluses assisted in calming him down. Lab was unsuccessful in the lab attempt to the patient restlessness at the time. Lab asked nursing to wait for lab redraw.  Discussed with Dr. Susanne Borders about titrating the versed drip down and okay to remain on current dose at this time. Md aware unable to perform blood glucose two hour checks due to agitation.

## 2014-09-26 NOTE — Progress Notes (Signed)
Patient had increased tardive dyskinesia with the Cogentin medication.  He was thrashing his head back and forth at the bed aggressively trying to climb out.  Dr. Delane Ginger. Gosrani called new orders given.

## 2014-09-27 LAB — BASIC METABOLIC PANEL
Anion gap: 5 (ref 5–15)
BUN: 22 mg/dL (ref 6–23)
CALCIUM: 8.3 mg/dL — AB (ref 8.4–10.5)
CO2: 24 mmol/L (ref 19–32)
Chloride: 112 mmol/L (ref 96–112)
Creatinine, Ser: 1.41 mg/dL — ABNORMAL HIGH (ref 0.50–1.35)
GFR calc Af Amer: 62 mL/min — ABNORMAL LOW (ref >90)
GFR, EST NON AFRICAN AMERICAN: 54 mL/min — AB (ref >90)
Glucose, Bld: 111 mg/dL — ABNORMAL HIGH (ref 70–99)
POTASSIUM: 4.1 mmol/L (ref 3.5–5.1)
Sodium: 141 mmol/L (ref 135–145)

## 2014-09-27 LAB — CBC WITH DIFFERENTIAL/PLATELET
Basophils Absolute: 0 10*3/uL (ref 0.0–0.1)
Basophils Relative: 0 % (ref 0–1)
Eosinophils Absolute: 0.2 10*3/uL (ref 0.0–0.7)
Eosinophils Relative: 4 % (ref 0–5)
HCT: 31.2 % — ABNORMAL LOW (ref 39.0–52.0)
Hemoglobin: 10.8 g/dL — ABNORMAL LOW (ref 13.0–17.0)
Lymphocytes Relative: 30 % (ref 12–46)
Lymphs Abs: 1.3 10*3/uL (ref 0.7–4.0)
MCH: 30.9 pg (ref 26.0–34.0)
MCHC: 34.6 g/dL (ref 30.0–36.0)
MCV: 89.1 fL (ref 78.0–100.0)
MONOS PCT: 7 % (ref 3–12)
Monocytes Absolute: 0.3 10*3/uL (ref 0.1–1.0)
NEUTROS PCT: 60 % (ref 43–77)
Neutro Abs: 2.6 10*3/uL (ref 1.7–7.7)
Platelets: 77 10*3/uL — ABNORMAL LOW (ref 150–400)
RBC: 3.5 MIL/uL — AB (ref 4.22–5.81)
RDW: 12.4 % (ref 11.5–15.5)
WBC: 4.3 10*3/uL (ref 4.0–10.5)

## 2014-09-27 LAB — GLUCOSE, CAPILLARY
GLUCOSE-CAPILLARY: 124 mg/dL — AB (ref 70–99)
Glucose-Capillary: 133 mg/dL — ABNORMAL HIGH (ref 70–99)
Glucose-Capillary: 141 mg/dL — ABNORMAL HIGH (ref 70–99)

## 2014-09-27 MED ORDER — SODIUM CHLORIDE 0.9 % IV SOLN
1.0000 mg/h | INTRAVENOUS | Status: DC
  Filled 2014-09-27: qty 10

## 2014-09-27 MED ORDER — DEXMEDETOMIDINE HCL IN NACL 200 MCG/50ML IV SOLN
0.4000 ug/kg/h | INTRAVENOUS | Status: DC
  Administered 2014-09-27: 0.6 ug/kg/h via INTRAVENOUS
  Administered 2014-09-27: 0.4 ug/kg/h via INTRAVENOUS
  Administered 2014-09-27: 1 ug/kg/h via INTRAVENOUS
  Administered 2014-09-28: 0.5 ug/kg/h via INTRAVENOUS
  Administered 2014-09-28: 0.9 ug/kg/h via INTRAVENOUS
  Administered 2014-09-28: 0.6 ug/kg/h via INTRAVENOUS
  Administered 2014-09-28: 0.7 ug/kg/h via INTRAVENOUS
  Administered 2014-09-28: 0.8 ug/kg/h via INTRAVENOUS
  Administered 2014-09-29: 0.4 ug/kg/h via INTRAVENOUS
  Administered 2014-09-29: 0.8 ug/kg/h via INTRAVENOUS
  Filled 2014-09-27 (×10): qty 50

## 2014-09-27 MED ORDER — DEXMEDETOMIDINE HCL IN NACL 200 MCG/50ML IV SOLN
INTRAVENOUS | Status: AC
  Filled 2014-09-27: qty 50

## 2014-09-27 NOTE — BH Assessment (Signed)
Clinician contacted physician to gather feedback who was unavailable at this time. Clinician spoke to [facility] who provided feedback about patient. Per Sarah patient brought in on 1/22 by [facility] which is nursing facility due to erratic behaviors such as hitting himself and hearing voices. At ED patient hitting his head on the bed vigorously, pushing staff and jumping out of bed. Patient has hx of Tardive dyskinesia. Per Judson Roch, ED physician looking for med consult for patient.  TTS has notified Richardson Landry, RN on C/A unit to notify Heloise Purpura, NP of consult.  Rigoberto Noel, LCSW Therapeutic Triage

## 2014-09-27 NOTE — Progress Notes (Signed)
Patient is talking more with increased lucidity in his conversations with the continual doses of his Benzodiazepines.  Patient is able to follow commands better and assist with moving himself up in the bed at times.  With his Tardive Dyskinesia can the Anticholinergic drug possibly be added back in concert with the Benzodiazepines scheduled to keep his current state and possibly increase it.

## 2014-09-27 NOTE — Progress Notes (Signed)
Subjective: He has continued to be combative. He has some sort of writhing movements. It's not totally clear to me that this is tardive dyskinesia. He got worse with IV Cogentin. I'm concerned about his safety  Objective: Vital signs in last 24 hours: Temp:  [96.4 F (35.8 C)-98.6 F (37 C)] 97.4 F (36.3 C) (01/24 0715) Pulse Rate:  [56-76] 76 (01/24 0700) Resp:  [15-45] 21 (01/24 0700) BP: (112-155)/(36-97) 155/65 mmHg (01/24 0700) SpO2:  [92 %-100 %] 97 % (01/24 0700) Weight change:  Last BM Date:  (unknown)  Intake/Output from previous day: 01/23 0701 - 01/24 0700 In: 3799.6 [P.O.:750; I.V.:2892.1; IV Piggyback:157.5] Out: 1450 [Urine:1450]  PHYSICAL EXAM General appearance: He is awake but still somewhat combative clearly confused and agitated Resp: rhonchi bilaterally Cardio: regular rate and rhythm, S1, S2 normal, no murmur, click, rub or gallop GI: soft, non-tender; bowel sounds normal; no masses,  no organomegaly Extremities: extremities normal, atraumatic, no cyanosis or edema  Lab Results:  Results for orders placed or performed during the hospital encounter of 09/25/14 (from the past 48 hour(s))  CBC with Differential     Status: Abnormal   Collection Time: 09/25/14 10:56 AM  Result Value Ref Range   WBC 5.5 4.0 - 10.5 K/uL   RBC 3.96 (L) 4.22 - 5.81 MIL/uL   Hemoglobin 12.2 (L) 13.0 - 17.0 g/dL   HCT 36.6 (L) 39.0 - 52.0 %   MCV 92.4 78.0 - 100.0 fL   MCH 30.8 26.0 - 34.0 pg   MCHC 33.3 30.0 - 36.0 g/dL   RDW 12.6 11.5 - 15.5 %   Platelets 102 (L) 150 - 400 K/uL    Comment: SPECIMEN CHECKED FOR CLOTS PLATELET COUNT CONFIRMED BY SMEAR    Neutrophils Relative % 55 43 - 77 %   Neutro Abs 3.0 1.7 - 7.7 K/uL   Lymphocytes Relative 36 12 - 46 %   Lymphs Abs 2.0 0.7 - 4.0 K/uL   Monocytes Relative 6 3 - 12 %   Monocytes Absolute 0.3 0.1 - 1.0 K/uL   Eosinophils Relative 2 0 - 5 %   Eosinophils Absolute 0.1 0.0 - 0.7 K/uL   Basophils Relative 1 0 - 1 %    Basophils Absolute 0.0 0.0 - 0.1 K/uL  Comprehensive metabolic panel     Status: Abnormal   Collection Time: 09/25/14 10:56 AM  Result Value Ref Range   Sodium 150 (H) 135 - 145 mmol/L   Potassium 6.5 (HH) 3.5 - 5.1 mmol/L    Comment: CRITICAL RESULT CALLED TO, READ BACK BY AND VERIFIED WITH: YOUNG,J AT 11:30AM ON 09/25/14 BY FESTERMAN,C    Chloride 115 (H) 96 - 112 mmol/L   CO2 27 19 - 32 mmol/L   Glucose, Bld 102 (H) 70 - 99 mg/dL   BUN 53 (H) 6 - 23 mg/dL   Creatinine, Ser 2.36 (H) 0.50 - 1.35 mg/dL   Calcium 10.1 8.4 - 10.5 mg/dL   Total Protein 6.7 6.0 - 8.3 g/dL   Albumin 4.0 3.5 - 5.2 g/dL   AST 32 0 - 37 U/L   ALT 28 0 - 53 U/L   Alkaline Phosphatase 71 39 - 117 U/L   Total Bilirubin 0.7 0.3 - 1.2 mg/dL   GFR calc non Af Amer 29 (L) >90 mL/min   GFR calc Af Amer 34 (L) >90 mL/min    Comment: (NOTE) The eGFR has been calculated using the CKD EPI equation. This calculation has not been validated  in all clinical situations. eGFR's persistently <90 mL/min signify possible Chronic Kidney Disease.    Anion gap 8 5 - 15  Ethanol     Status: None   Collection Time: 09/25/14 10:56 AM  Result Value Ref Range   Alcohol, Ethyl (B) <5 0 - 9 mg/dL    Comment:        LOWEST DETECTABLE LIMIT FOR SERUM ALCOHOL IS 11 mg/dL FOR MEDICAL PURPOSES ONLY   CBG monitoring, ED     Status: None   Collection Time: 09/25/14 11:03 AM  Result Value Ref Range   Glucose-Capillary 99 70 - 99 mg/dL  Potassium     Status: None   Collection Time: 09/25/14  1:22 PM  Result Value Ref Range   Potassium 4.4 3.5 - 5.1 mmol/L    Comment: DELTA CHECK NOTED  Valproic acid level     Status: Abnormal   Collection Time: 09/25/14  1:22 PM  Result Value Ref Range   Valproic Acid Lvl 27.3 (L) 50.0 - 100.0 ug/mL  POC CBG, ED     Status: Abnormal   Collection Time: 09/25/14  1:27 PM  Result Value Ref Range   Glucose-Capillary 28 (LL) 70 - 99 mg/dL   Comment 1 Notify RN   POC CBG, ED     Status: Abnormal    Collection Time: 09/25/14  2:21 PM  Result Value Ref Range   Glucose-Capillary 42 (LL) 70 - 99 mg/dL  CBG monitoring, ED     Status: Abnormal   Collection Time: 09/25/14  2:47 PM  Result Value Ref Range   Glucose-Capillary 143 (H) 70 - 99 mg/dL   Comment 1 Documented in Chart    Comment 2 Notify RN   MRSA PCR Screening     Status: Abnormal   Collection Time: 09/25/14  2:57 PM  Result Value Ref Range   MRSA by PCR POSITIVE (A) NEGATIVE    Comment:        The GeneXpert MRSA Assay (FDA approved for NASAL specimens only), is one component of a comprehensive MRSA colonization surveillance program. It is not intended to diagnose MRSA infection nor to guide or monitor treatment for MRSA infections. RESULT CALLED TO, READ BACK BY AND VERIFIED WITH: NIELSON,T ON 09/25/14 AT 2105 BY LOY,C   Glucose, capillary     Status: Abnormal   Collection Time: 09/25/14  4:11 PM  Result Value Ref Range   Glucose-Capillary 65 (L) 70 - 99 mg/dL   Comment 1 Notify RN   Glucose, capillary     Status: Abnormal   Collection Time: 09/25/14  4:25 PM  Result Value Ref Range   Glucose-Capillary 144 (H) 70 - 99 mg/dL  Glucose, capillary     Status: None   Collection Time: 09/25/14  5:44 PM  Result Value Ref Range   Glucose-Capillary 98 70 - 99 mg/dL  Glucose, capillary     Status: None   Collection Time: 09/25/14  6:36 PM  Result Value Ref Range   Glucose-Capillary 84 70 - 99 mg/dL   Comment 1 Notify RN   Urine rapid drug screen (hosp performed)     Status: None   Collection Time: 09/25/14  7:09 PM  Result Value Ref Range   Opiates NONE DETECTED NONE DETECTED   Cocaine NONE DETECTED NONE DETECTED   Benzodiazepines NONE DETECTED NONE DETECTED   Amphetamines NONE DETECTED NONE DETECTED   Tetrahydrocannabinol NONE DETECTED NONE DETECTED   Barbiturates NONE DETECTED NONE DETECTED    Comment:  DRUG SCREEN FOR MEDICAL PURPOSES ONLY.  IF CONFIRMATION IS NEEDED FOR ANY PURPOSE, NOTIFY  LAB WITHIN 5 DAYS.        LOWEST DETECTABLE LIMITS FOR URINE DRUG SCREEN Drug Class       Cutoff (ng/mL) Amphetamine      1000 Barbiturate      200 Benzodiazepine   620 Tricyclics       355 Opiates          300 Cocaine          300 THC              50   Urinalysis, Routine w reflex microscopic     Status: None   Collection Time: 09/25/14  7:09 PM  Result Value Ref Range   Color, Urine YELLOW YELLOW   APPearance CLEAR CLEAR   Specific Gravity, Urine 1.025 1.005 - 1.030   pH 5.5 5.0 - 8.0   Glucose, UA NEGATIVE NEGATIVE mg/dL   Hgb urine dipstick NEGATIVE NEGATIVE   Bilirubin Urine NEGATIVE NEGATIVE   Ketones, ur NEGATIVE NEGATIVE mg/dL   Protein, ur NEGATIVE NEGATIVE mg/dL   Urobilinogen, UA 0.2 0.0 - 1.0 mg/dL   Nitrite NEGATIVE NEGATIVE   Leukocytes, UA NEGATIVE NEGATIVE    Comment: MICROSCOPIC NOT DONE ON URINES WITH NEGATIVE PROTEIN, BLOOD, LEUKOCYTES, NITRITE, OR GLUCOSE <1000 mg/dL.  Glucose, capillary     Status: Abnormal   Collection Time: 09/25/14  8:53 PM  Result Value Ref Range   Glucose-Capillary 117 (H) 70 - 99 mg/dL   Comment 1 Notify RN   Glucose, capillary     Status: Abnormal   Collection Time: 09/26/14  7:50 AM  Result Value Ref Range   Glucose-Capillary 119 (H) 70 - 99 mg/dL  Basic metabolic panel     Status: Abnormal   Collection Time: 09/26/14  9:17 AM  Result Value Ref Range   Sodium 142 135 - 145 mmol/L    Comment: DELTA CHECK NOTED   Potassium 4.3 3.5 - 5.1 mmol/L   Chloride 111 96 - 112 mmol/L   CO2 27 19 - 32 mmol/L   Glucose, Bld 130 (H) 70 - 99 mg/dL   BUN 38 (H) 6 - 23 mg/dL   Creatinine, Ser 1.76 (H) 0.50 - 1.35 mg/dL   Calcium 8.5 8.4 - 10.5 mg/dL   GFR calc non Af Amer 41 (L) >90 mL/min   GFR calc Af Amer 48 (L) >90 mL/min    Comment: (NOTE) The eGFR has been calculated using the CKD EPI equation. This calculation has not been validated in all clinical situations. eGFR's persistently <90 mL/min signify possible Chronic  Kidney Disease.    Anion gap 4 (L) 5 - 15  CBC     Status: Abnormal   Collection Time: 09/26/14  9:17 AM  Result Value Ref Range   WBC 4.2 4.0 - 10.5 K/uL   RBC 3.66 (L) 4.22 - 5.81 MIL/uL   Hemoglobin 11.3 (L) 13.0 - 17.0 g/dL   HCT 33.2 (L) 39.0 - 52.0 %   MCV 90.7 78.0 - 100.0 fL   MCH 30.9 26.0 - 34.0 pg   MCHC 34.0 30.0 - 36.0 g/dL   RDW 12.6 11.5 - 15.5 %   Platelets 79 (L) 150 - 400 K/uL    Comment: SPECIMEN CHECKED FOR CLOTS PLATELETS APPEAR DECREASED PLATELET COUNT CONFIRMED BY SMEAR   Glucose, capillary     Status: Abnormal   Collection Time: 09/26/14  9:47 AM  Result  Value Ref Range   Glucose-Capillary 118 (H) 70 - 99 mg/dL   Comment 1 Notify RN   Glucose, capillary     Status: Abnormal   Collection Time: 09/26/14 11:44 AM  Result Value Ref Range   Glucose-Capillary 121 (H) 70 - 99 mg/dL   Comment 1 Notify RN   Glucose, capillary     Status: Abnormal   Collection Time: 09/26/14  1:59 PM  Result Value Ref Range   Glucose-Capillary 121 (H) 70 - 99 mg/dL  Glucose, capillary     Status: Abnormal   Collection Time: 09/26/14  4:26 PM  Result Value Ref Range   Glucose-Capillary 128 (H) 70 - 99 mg/dL   Comment 1 Notify RN   Glucose, capillary     Status: Abnormal   Collection Time: 09/26/14  6:10 PM  Result Value Ref Range   Glucose-Capillary 104 (H) 70 - 99 mg/dL   Comment 1 Notify RN   Glucose, capillary     Status: Abnormal   Collection Time: 09/26/14  8:30 PM  Result Value Ref Range   Glucose-Capillary 106 (H) 70 - 99 mg/dL  Glucose, capillary     Status: Abnormal   Collection Time: 09/27/14  7:18 AM  Result Value Ref Range   Glucose-Capillary 133 (H) 70 - 99 mg/dL    ABGS No results for input(s): PHART, PO2ART, TCO2, HCO3 in the last 72 hours.  Invalid input(s): PCO2 CULTURES Recent Results (from the past 240 hour(s))  MRSA PCR Screening     Status: Abnormal   Collection Time: 09/25/14  2:57 PM  Result Value Ref Range Status   MRSA by PCR  POSITIVE (A) NEGATIVE Final    Comment:        The GeneXpert MRSA Assay (FDA approved for NASAL specimens only), is one component of a comprehensive MRSA colonization surveillance program. It is not intended to diagnose MRSA infection nor to guide or monitor treatment for MRSA infections. RESULT CALLED TO, READ BACK BY AND VERIFIED WITH: NIELSON,T ON 09/25/14 AT 2105 BY LOY,C    Studies/Results: Ct Head Wo Contrast  09/26/2014   CLINICAL DATA:  Altered mental status. Pt thrashing head and body. Pt was given multiple doses of medication to calm him down. Pt still combative during scan./  EXAM: CT HEAD WITHOUT CONTRAST  TECHNIQUE: Contiguous axial images were obtained from the base of the skull through the vertex without intravenous contrast.  COMPARISON:  07/13/2014  FINDINGS: Ventricles are normal in overall configuration. There is ex vacuo dilation of the posterior intra or right lateral ventricle or old right temporal occipital infarct. An old lacunar infarct is noted in the left basal ganglia.  There are no parenchymal masses or mass effect. There is no evidence of a recent cortical infarct. No extra-axial masses or abnormal fluid collections.  No intracranial hemorrhage.  Visualized sinuses and mastoid air cells are clear.  IMPRESSION: 1. No acute intracranial abnormalities. 2. Fall right temporal occipital lobe infarct. Old left thalamic lacunar infarct. Stable appearance from the prior head CT.   Electronically Signed   By: Lajean Manes M.D.   On: 09/26/2014 15:23    Medications:  Prior to Admission:  Prescriptions prior to admission  Medication Sig Dispense Refill Last Dose  . amoxicillin (AMOXIL) 500 MG capsule Take 500 mg by mouth 3 (three) times daily.   09/24/2014 at 2000  . aspirin 325 MG tablet Take 325 mg by mouth daily.     09/24/2014 at Unknown time  .  baclofen (LIORESAL) 10 MG tablet Take 10 mg by mouth 3 (three) times daily.   09/24/2014 at 2000  . cloNIDine (CATAPRES) 0.3  MG tablet Take 0.3 mg by mouth daily.     09/24/2014 at Unknown time  . divalproex (DEPAKOTE SPRINKLE) 125 MG capsule Take 250 mg by mouth 3 (three) times daily.   09/24/2014 at 2000  . feeding supplement, ENSURE COMPLETE, (ENSURE COMPLETE) LIQD Take 237 mLs by mouth at bedtime.   09/24/2014 at 2000  . ibuprofen (ADVIL,MOTRIN) 800 MG tablet Take 800 mg by mouth every 6 (six) hours as needed for moderate pain.   unknown  . labetalol (NORMODYNE) 200 MG tablet Take 200 mg by mouth 2 (two) times daily.     09/24/2014 at 2000  . QUEtiapine (SEROQUEL) 200 MG tablet Take 200 mg by mouth 2 (two) times daily.   09/24/2014 at 2000  . ramipril (ALTACE) 10 MG tablet Take 10 mg by mouth daily.     09/24/2014 at 0800  . simvastatin (ZOCOR) 20 MG tablet Take 20 mg by mouth at bedtime.    09/24/2014 at 2000  . trihexyphenidyl (ARTANE) 5 MG tablet Take 5 mg by mouth 2 (two) times daily with a meal.   09/24/2014 at 2000  . amoxicillin-clavulanate (AUGMENTIN) 875-125 MG per tablet Take 1 tablet by mouth every 12 (twelve) hours. Last dose November 16 in the evening. (Patient not taking: Reported on 09/25/2014)     . fluticasone (FLONASE) 50 MCG/ACT nasal spray Place 2 sprays into the nose daily as needed. For allergies   unknown  . risperiDONE (RISPERDAL) 1 MG tablet Take 1 tablet (1 mg total) by mouth 3 (three) times daily. 90 tablet 2 09/06/2013 at 0800  . temazepam (RESTORIL) 15 MG capsule Take 15 mg by mouth at bedtime as needed for sleep.    unknown   Scheduled: . antiseptic oral rinse  7 mL Mouth Rinse BID  . baclofen  10 mg Oral TID  . Chlorhexidine Gluconate Cloth  6 each Topical Q0600  . feeding supplement (ENSURE COMPLETE)  237 mL Oral QHS  . mupirocin ointment  1 application Nasal BID  . QUEtiapine  200 mg Oral BID  . simvastatin  20 mg Oral QHS  . sodium chloride  3 mL Intravenous Q12H  . trihexyphenidyl  5 mg Oral BID WC  . valproate sodium  250 mg Intravenous Q8H   Continuous: . dexmedetomidine 0.4  mcg/kg/hr (09/27/14 0910)  . dextrose 5 % and 0.45% NaCl 125 mL/hr at 09/27/14 0921  . midazolam (VERSED) infusion 1 mg/hr (09/27/14 0913)   JIR:[id number] **OR** acetaminophen, labetalol, LORazepam, midazolam  Assesment: He was admitted with altered mental status. He's had previous CVA. He has bipolar disease. He has abnormal movements and it was thought that this was tardive dyskinesia but he actually got worse with Cogentin. I'm not certain clinically that this is tardive dyskinesia. I'm concerned that he is going to get hurt. He has had a previous stroke but nothing new. His renal function is somewhat better. Principal Problem:   Hyperkalemia Active Problems:   Bipolar I disorder, most recent episode mixed   H/O: stroke   Dehydration   CVA, old, cognitive deficits   CKD (chronic kidney disease) stage 3, GFR 30-59 ml/min   Thrombocytopenia   Acute encephalopathy   Acute renal failure   Hypoglycemia    Plan: I'm going to give him Precedex. He'll take this for 24 hours or so and then will try to  see if he's better. He will need neurology consultation. I'm going to ask for psychiatry consultation although on the Precedex final things are going to be able to get any information from him but I would like to have their help with medication assessment.    LOS: 2 days   Jahad Old L 09/27/2014, 9:19 AM

## 2014-09-27 NOTE — Consult Note (Signed)
This NP thoroughly reviewed all records, current records, med rec, chart, active orders, labs, tests, and EKG's. Pt presents with chronic abnormalities, asymptomatic for such in the past, and Creatinine, trending downward appropriately. EKG unremarkable. This NP spoke to Penni Homans, RN, caring for the patient today. This RN reports that the pt has been medically sedated due to combative behavior.   This NP left a voicemail and text message for Dr. Luan Pulling, who has rounded on the patient today and yesterday and is the only provider who has seen patient in his present condition. We are awaiting a return call.   This NP consulted with Dr. Darleene Cleaver and shared the opinion that pt should not be sedated with Versed or Precedex at this time; NP and MD both in agreement that pt should be allowed to regain alert level of consciousness so that he can continue to take his PO psychiatric medications for stabilization and that it is not currently advisable to alter those medications or routes.  Dr. Darleene Cleaver reports that he knows this patient well, has cared for him, and that he has Parkinsonian-like shakes and tremors as his baseline and that he is also intermittently combative as his baseline and that this is likely the cause for the symptomology the patient presented with from the nursing home rather than tardive dyskinesia.   All factors considered, it is psychiatry's recommendation to allow patient to wake up so that he can take his PO psychiatric medications as prescribed and to use Ativan PRN as is on chart for agitation. Internal Medicine may continue to seek organic etiology for current behavior although there may be none at this time as it appears pt may be near baseline already. Psychiatry can consult on this patient later, if necessary, when he is awake and alert, although he may improve if allowed to resume his medications.  Derrick Mola, FNP-BC 09/27/2014   01:44PM   Patient seen, evaluated and I  agree with notes by Nurse Practitioner. Corena Pilgrim, MD

## 2014-09-27 NOTE — Progress Notes (Addendum)
Pt's has begun to become bradycardic this afternoon since being placed on precedex drip. BP has remained stable along with respiratory rate and oxygen saturation. Will stop precedex drip for now and monitor. If needed will treat pt's agitation with PRN ativan or restart precedex drip.

## 2014-09-27 NOTE — Progress Notes (Signed)
Patient has had several doses of Ativan and boluses of Versed throughout the shift to extremely agitated to restless.  Patient is calm at this time and labs draw has been rescheduled for patient comfort.

## 2014-09-27 NOTE — Progress Notes (Addendum)
After turning off precedex drip, pt was alert enough to take evening doses of medications crushed and mixed in jello. Pt required much encouragement and quickly become agitated with a RASS score of +4 and was inconsolable. Precedex restarted. Will attempt to wean  pt off of precedex again and encourage PO intake of psychiatric meds as prescribed. Will continue to monitor.

## 2014-09-28 ENCOUNTER — Inpatient Hospital Stay (HOSPITAL_COMMUNITY)
Admission: EM | Admit: 2014-09-28 | Discharge: 2014-09-28 | Disposition: A | Discharge status: Home / Self Care | Payer: Medicare Other | Source: Home / Self Care | Attending: Internal Medicine | Admitting: Internal Medicine

## 2014-09-28 LAB — GLUCOSE, CAPILLARY
GLUCOSE-CAPILLARY: 134 mg/dL — AB (ref 70–99)
Glucose-Capillary: 148 mg/dL — ABNORMAL HIGH (ref 70–99)
Glucose-Capillary: 151 mg/dL — ABNORMAL HIGH (ref 70–99)

## 2014-09-28 MED ORDER — VALPROATE SODIUM 500 MG/5ML IV SOLN
500.0000 mg | Freq: Three times a day (TID) | INTRAVENOUS | Status: DC
  Administered 2014-09-28 – 2014-10-06 (×24): 500 mg via INTRAVENOUS
  Filled 2014-09-28 (×28): qty 5

## 2014-09-28 MED ORDER — HALOPERIDOL LACTATE 5 MG/ML IJ SOLN
1.0000 mg | Freq: Four times a day (QID) | INTRAMUSCULAR | Status: DC | PRN
  Administered 2014-09-29 (×2): 1 mg via INTRAVENOUS
  Filled 2014-09-28 (×3): qty 1

## 2014-09-28 NOTE — Clinical Social Work Note (Signed)
CSW left a message for patient's legal guardian, [name] and Ivin Booty at [facility] regarding patient.    Ambrose Pancoast, [place]

## 2014-09-28 NOTE — Progress Notes (Signed)
Subjective: Patient was admitted due acute encephalopathy and combativeness. Patient is a known case of bipolar on multiple psychoactive medications. He had hyperkalemia and acute renal failure due to dehydration. His renal function is improving. He is being evaluated by neurology. Patient is currently sedated.  Objective: Vital signs in last 24 hours: Temp:  [97.4 F (36.3 C)-98.3 F (36.8 C)] 98.3 F (36.8 C) (01/25 0530) Pulse Rate:  [47-154] 74 (01/25 0700) Resp:  [13-43] 25 (01/25 0700) BP: (92-179)/(40-133) 124/68 mmHg (01/25 0700) SpO2:  [85 %-100 %] 100 % (01/25 0700) Weight:  [59 kg (130 lb 1.1 oz)] 59 kg (130 lb 1.1 oz) (01/25 0500) Weight change:  Last BM Date: 09/28/14  Intake/Output from previous day: 01/24 0701 - 01/25 0700 In: 3291.6 [P.O.:210; I.V.:2924.1; IV Piggyback:157.5] Out: 1705 [Urine:1700; Stool:5]  PHYSICAL EXAM General appearance: no distress and slowed mentation Resp: diminished breath sounds bilaterally and rhonchi bilaterally Cardio: S1, S2 normal GI: soft, non-tender; bowel sounds normal; no masses,  no organomegaly Extremities: extremities normal, atraumatic, no cyanosis or edema  Lab Results:  Results for orders placed or performed during the hospital encounter of 09/25/14 (from the past 48 hour(s))  Basic metabolic panel     Status: Abnormal   Collection Time: 09/26/14  9:17 AM  Result Value Ref Range   Sodium 142 135 - 145 mmol/L    Comment: DELTA CHECK NOTED   Potassium 4.3 3.5 - 5.1 mmol/L   Chloride 111 96 - 112 mmol/L   CO2 27 19 - 32 mmol/L   Glucose, Bld 130 (H) 70 - 99 mg/dL   BUN 38 (H) 6 - 23 mg/dL   Creatinine, Ser 1.76 (H) 0.50 - 1.35 mg/dL   Calcium 8.5 8.4 - 10.5 mg/dL   GFR calc non Af Amer 41 (L) >90 mL/min   GFR calc Af Amer 48 (L) >90 mL/min    Comment: (NOTE) The eGFR has been calculated using the CKD EPI equation. This calculation has not been validated in all clinical situations. eGFR's persistently <90 mL/min  signify possible Chronic Kidney Disease.    Anion gap 4 (L) 5 - 15  CBC     Status: Abnormal   Collection Time: 09/26/14  9:17 AM  Result Value Ref Range   WBC 4.2 4.0 - 10.5 K/uL   RBC 3.66 (L) 4.22 - 5.81 MIL/uL   Hemoglobin 11.3 (L) 13.0 - 17.0 g/dL   HCT 33.2 (L) 39.0 - 52.0 %   MCV 90.7 78.0 - 100.0 fL   MCH 30.9 26.0 - 34.0 pg   MCHC 34.0 30.0 - 36.0 g/dL   RDW 12.6 11.5 - 15.5 %   Platelets 79 (L) 150 - 400 K/uL    Comment: SPECIMEN CHECKED FOR CLOTS PLATELETS APPEAR DECREASED PLATELET COUNT CONFIRMED BY SMEAR   Glucose, capillary     Status: Abnormal   Collection Time: 09/26/14  9:47 AM  Result Value Ref Range   Glucose-Capillary 118 (H) 70 - 99 mg/dL   Comment 1 Notify RN   Glucose, capillary     Status: Abnormal   Collection Time: 09/26/14 11:44 AM  Result Value Ref Range   Glucose-Capillary 121 (H) 70 - 99 mg/dL   Comment 1 Notify RN   Glucose, capillary     Status: Abnormal   Collection Time: 09/26/14  1:59 PM  Result Value Ref Range   Glucose-Capillary 121 (H) 70 - 99 mg/dL  Glucose, capillary     Status: Abnormal   Collection Time: 09/26/14  4:26 PM  Result Value Ref Range   Glucose-Capillary 128 (H) 70 - 99 mg/dL   Comment 1 Notify RN   Glucose, capillary     Status: Abnormal   Collection Time: 09/26/14  6:10 PM  Result Value Ref Range   Glucose-Capillary 104 (H) 70 - 99 mg/dL   Comment 1 Notify RN   Glucose, capillary     Status: Abnormal   Collection Time: 09/26/14  8:30 PM  Result Value Ref Range   Glucose-Capillary 106 (H) 70 - 99 mg/dL  Glucose, capillary     Status: Abnormal   Collection Time: 09/27/14  7:18 AM  Result Value Ref Range   Glucose-Capillary 133 (H) 70 - 99 mg/dL  Basic metabolic panel     Status: Abnormal   Collection Time: 09/27/14 10:22 AM  Result Value Ref Range   Sodium 141 135 - 145 mmol/L   Potassium 4.1 3.5 - 5.1 mmol/L   Chloride 112 96 - 112 mmol/L   CO2 24 19 - 32 mmol/L   Glucose, Bld 111 (H) 70 - 99 mg/dL   BUN  22 6 - 23 mg/dL    Comment: DELTA CHECK NOTED   Creatinine, Ser 1.41 (H) 0.50 - 1.35 mg/dL   Calcium 8.3 (L) 8.4 - 10.5 mg/dL   GFR calc non Af Amer 54 (L) >90 mL/min   GFR calc Af Amer 62 (L) >90 mL/min    Comment: (NOTE) The eGFR has been calculated using the CKD EPI equation. This calculation has not been validated in all clinical situations. eGFR's persistently <90 mL/min signify possible Chronic Kidney Disease.    Anion gap 5 5 - 15  CBC with Differential/Platelet     Status: Abnormal   Collection Time: 09/27/14 10:22 AM  Result Value Ref Range   WBC 4.3 4.0 - 10.5 K/uL   RBC 3.50 (L) 4.22 - 5.81 MIL/uL   Hemoglobin 10.8 (L) 13.0 - 17.0 g/dL   HCT 31.2 (L) 39.0 - 52.0 %   MCV 89.1 78.0 - 100.0 fL   MCH 30.9 26.0 - 34.0 pg   MCHC 34.6 30.0 - 36.0 g/dL   RDW 12.4 11.5 - 15.5 %   Platelets 77 (L) 150 - 400 K/uL    Comment: SPECIMEN CHECKED FOR CLOTS PLATELET COUNT CONFIRMED BY SMEAR    Neutrophils Relative % 60 43 - 77 %   Neutro Abs 2.6 1.7 - 7.7 K/uL   Lymphocytes Relative 30 12 - 46 %   Lymphs Abs 1.3 0.7 - 4.0 K/uL   Monocytes Relative 7 3 - 12 %   Monocytes Absolute 0.3 0.1 - 1.0 K/uL   Eosinophils Relative 4 0 - 5 %   Eosinophils Absolute 0.2 0.0 - 0.7 K/uL   Basophils Relative 0 0 - 1 %   Basophils Absolute 0.0 0.0 - 0.1 K/uL  Glucose, capillary     Status: Abnormal   Collection Time: 09/27/14 11:22 AM  Result Value Ref Range   Glucose-Capillary 124 (H) 70 - 99 mg/dL  Glucose, capillary     Status: Abnormal   Collection Time: 09/27/14  4:07 PM  Result Value Ref Range   Glucose-Capillary 141 (H) 70 - 99 mg/dL  Glucose, capillary     Status: Abnormal   Collection Time: 09/27/14  9:41 PM  Result Value Ref Range   Glucose-Capillary 134 (H) 70 - 99 mg/dL   Comment 1 Notify RN     ABGS No results for input(s): PHART, PO2ART, TCO2, HCO3  in the last 72 hours.  Invalid input(s): PCO2 CULTURES Recent Results (from the past 240 hour(s))  MRSA PCR Screening      Status: Abnormal   Collection Time: 09/25/14  2:57 PM  Result Value Ref Range Status   MRSA by PCR POSITIVE (A) NEGATIVE Final    Comment:        The GeneXpert MRSA Assay (FDA approved for NASAL specimens only), is one component of a comprehensive MRSA colonization surveillance program. It is not intended to diagnose MRSA infection nor to guide or monitor treatment for MRSA infections. RESULT CALLED TO, READ BACK BY AND VERIFIED WITH: NIELSON,T ON 09/25/14 AT 2105 BY LOY,C    Studies/Results: Ct Head Wo Contrast  09/26/2014   CLINICAL DATA:  Altered mental status. Pt thrashing head and body. Pt was given multiple doses of medication to calm him down. Pt still combative during scan./  EXAM: CT HEAD WITHOUT CONTRAST  TECHNIQUE: Contiguous axial images were obtained from the base of the skull through the vertex without intravenous contrast.  COMPARISON:  07/13/2014  FINDINGS: Ventricles are normal in overall configuration. There is ex vacuo dilation of the posterior intra or right lateral ventricle or old right temporal occipital infarct. An old lacunar infarct is noted in the left basal ganglia.  There are no parenchymal masses or mass effect. There is no evidence of a recent cortical infarct. No extra-axial masses or abnormal fluid collections.  No intracranial hemorrhage.  Visualized sinuses and mastoid air cells are clear.  IMPRESSION: 1. No acute intracranial abnormalities. 2. Fall right temporal occipital lobe infarct. Old left thalamic lacunar infarct. Stable appearance from the prior head CT.   Electronically Signed   By: Lajean Manes M.D.   On: 09/26/2014 15:23    Medications: I have reviewed the patient's current medications.  Assesment:   Principal Problem:   Hyperkalemia Active Problems:   Bipolar I disorder, most recent episode mixed   H/O: stroke   Dehydration   CVA, old, cognitive deficits   CKD (chronic kidney disease) stage 3, GFR 30-59 ml/min   Thrombocytopenia    Acute encephalopathy   Acute renal failure   Hypoglycemia    Plan:  Medications reviewed Will continue IV hydration Will monitor BMP Will follow neurology ecommendation.    LOS: 3 days   Vineeth Fell 09/28/2014, 8:28 AM

## 2014-09-28 NOTE — Clinical Social Work Psychosocial (Signed)
Clinical Social Work Department BRIEF PSYCHOSOCIAL ASSESSMENT 09/28/2014  Patient:  Derrick Butler, Derrick Butler     Account Number:  [id number]     Admit date:  09/25/2014  Clinical Social Worker:  Legrand Como  Date/Time:  09/28/2014 12:39 PM  Referred by:  CSW  Date Referred:  09/28/2014 Referred for  SNF Placement   Other Referral:   Interview type:  Other - See comment Other interview type:   Legal Guardian, Carlyon Shadow    PSYCHOSOCIAL DATA Living Status:  FACILITY Admitted from facility:  [facility] Level of care:  [place] Primary support name:  [name] Primary support relationship to patient:  FRIEND Degree of support available:   [name] is very supportive    CURRENT CONCERNS Current Concerns  Post-Acute Placement   Other Concerns:    SOCIAL WORK ASSESSMENT / PLAN CSW could not meet with patient as he was not able to participate due to his condition.  CSW spoke with [name], legal guardian, who advised that patient has been at [facility] since approximately 11/14. She stated that she wants him to return to the facility upon discharge. Ms. Sandi Mariscal indicated that patient uses a wheelchair and "when he was having fits the other day he was walking." She indicated that patient's ADL's assistance are provided by [facility]. Ms. Sandi Mariscal indicated that she is out of town through Friday and could be reached via cell phone. She indicated that she visits patient frequently.  CSW spoke with Ivin Booty at [facility]. She confirmed Ms. Deboraha Sprang statements.  She advised that patient could return to the facility upon discharge.   Assessment/plan status:  Psychosocial Support/Ongoing Assessment of Needs Other assessment/ plan:   Information/referral to community resources:    PATIENT'S/FAMILY'S RESPONSE TO PLAN OF CARE: Legal guardian and facility agreeable for patient to return to [facility] upon discharge.   Ambrose Pancoast, [place]

## 2014-09-28 NOTE — Progress Notes (Signed)
Patient was lucid, oriented and talkative at the beginning of the shift with sedation medication turned off her was arousal within twenty minutes.  Once the patient had to have a bowel movement he became increasing agitated, combative and spastic tremors with each episode.  Patient produced hard balls and was disimpacted with some relief.  With his sedation medication having to steadily increase during the episodes.  The patient slept well through the night with the sedation medication especially since the previous night he wasn't able to rest with the spastic tremors.

## 2014-09-28 NOTE — Care Management Utilization Note (Signed)
UR completed 

## 2014-09-28 NOTE — Progress Notes (Signed)
EEG completed; results pending.    

## 2014-09-28 NOTE — Consult Note (Signed)
Derrick A. [name], MD     [email or web address]          Derrick Butler is an 59 y.o. male.   ASSESSMENT/PLAN:  1. Toxic metabolic encephalopathy most likely due to acute renal failure. Dehydration and medication effect compounded by reduced clearance from acute renal impairment are also contributing factors.  2. Agitated confusion and combativeness.  3. Baseline history of tremors. No evidence of tardive dyskinesia or parkinsonism at this time.  4. Baseline vascular dementia.  4. 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  Recommendations: 1. As needed doses of haloperidol. 2. Discontinue baclofen for now. 3. EEG. 4. Increase dose of Depakote.  The patient is a 59 year old white male who is a resident at a local nursing home facility who presented to the hospital with the increasing confusion and combativeness. Patient has been quite restless. There are some concerns that patient may have tardive dyskinesia. He was given doses of Cogentin IV but review of the chart indicates that this may have made things worse. The patient appears to be getting doses of Ativan on as needed basis which causes drowsiness. He seemed to have waxing and waning levels of arousal. Review of previous notes and the records indicates that he has been admitted to the hospital last year for acute renal failure due to urinary tract infection. At time he was found unresponsive for about 2 hours. He apparently improve spontaneously. No apparent metabolic derangements were noted during that evaluation. At baseline, the patient appears to be able to ambulate without assistance devices and is cooperative with evaluation. Again, the patient has had waxing and waning levels of consciousness. He was able to use the bathroom at the bedside with assistance but now appears to be unresponsive. Review of systems is extremely limited due to the impaired cognition.  GENERAL: Patient is quite  drowsy and unresponsive at this time.  HEENT: Supple. Atraumatic normocephalic.   ABDOMEN: soft  EXTREMITIES: No edema. There are bandages/dressing seen involving multiple areas of the legs.   BACK: Normal.  SKIN: Normal by inspection.    MENTAL STATUS: The patient initially was deeply encephalopathic/stuporous with no eye opening even to deep painful stimuli. Several minutes later he actually opened his eyes spontaneously. He focused and tracked but did not follow commands. The patient started thrashing around vigorously and it was quite restless.  CRANIAL NERVES: Pupils are large 6 mm but reactive. Initially pupils were deviated downwards. After the patient woke up, he has full extraocular movements. Coronary reflexes are intact. The patient seemed to not to respond to direct threat bilaterally suggestive of possible visual field impairment. Facial muscle strength symmetric.  MOTOR: Bulk and tone are good. The patient moves both sides vigorously.  COORDINATION: No tremors, dystonia or dysmetria noted. No rigidity, bradykinesia or parkinsonism observed.  SENSATION: Normal to deep painful stimuli.   Brain CT scan is reviewed in person. There are large cortical infarcts involving 2 areas in particular. These are the right PCA and the classic distribution involving the occipital area and extending to the medial temporal area. There is also distinct area of encephalomalacia involving the right parietal frontal region essentially involving the posterior watershed area. There is a encephalomalacia involving the inferior cerebellum on the right. There is also lacunar infarct that is old involving the left potamen. Nothing acute is seen.     Blood pressure 124/68, pulse 74, temperature 98.3 F (36.8 C), temperature source Axillary, resp. rate 25, height 5' 9"  (1.753  m), weight 59 kg (130 lb 1.1 oz), SpO2 100 %.  Past Medical History  Diagnosis Date  . Aortic dissection   . Hypertension     . CVA (cerebral infarction)   . Aortic dissection   . Hypertension   . CVA (cerebral infarction)   . Diabetes mellitus   . Cardiomyopathy   . Hyperglycemia   . Bipolar 1 disorder   . Hyperkalemia   . Coronary artery disease   . Hyponatremia   . Altered mental status     with psychosis  . Dehydration   . TIA (transient ischemic attack)   . Lack of coordination   . Cardiomyopathy     Past Surgical History  Procedure Laterality Date  . Ascending aortic aneurysm repair      Family History  Problem Relation Age of Onset  . Alzheimer's disease Mother   . Stroke Father   . Heart disease Father   . Bipolar disorder Father   . Bipolar disorder Sister   . Bipolar disorder Sister     Social History:  reports that he has never smoked. He does not have any smokeless tobacco history on file. He reports that he does not drink alcohol or use illicit drugs.  Allergies: No Known Allergies  Medications: Prior to Admission medications   Medication Sig Start Date End Date Taking? Authorizing Provider  amoxicillin (AMOXIL) 500 MG capsule Take 500 mg by mouth 3 (three) times daily.   Yes Historical Provider, MD  aspirin 325 MG tablet Take 325 mg by mouth daily.     Yes Historical Provider, MD  baclofen (LIORESAL) 10 MG tablet Take 10 mg by mouth 3 (three) times daily.   Yes Historical Provider, MD  cloNIDine (CATAPRES) 0.3 MG tablet Take 0.3 mg by mouth daily.     Yes Historical Provider, MD  divalproex (DEPAKOTE SPRINKLE) 125 MG capsule Take 250 mg by mouth 3 (three) times daily.   Yes Historical Provider, MD  feeding supplement, ENSURE COMPLETE, (ENSURE COMPLETE) LIQD Take 237 mLs by mouth at bedtime.   Yes Historical Provider, MD  ibuprofen (ADVIL,MOTRIN) 800 MG tablet Take 800 mg by mouth every 6 (six) hours as needed for moderate pain.   Yes Historical Provider, MD  labetalol (NORMODYNE) 200 MG tablet Take 200 mg by mouth 2 (two) times daily.     Yes Historical Provider, MD   QUEtiapine (SEROQUEL) 200 MG tablet Take 200 mg by mouth 2 (two) times daily.   Yes Historical Provider, MD  ramipril (ALTACE) 10 MG tablet Take 10 mg by mouth daily.     Yes Historical Provider, MD  simvastatin (ZOCOR) 20 MG tablet Take 20 mg by mouth at bedtime.    Yes Historical Provider, MD  trihexyphenidyl (ARTANE) 5 MG tablet Take 5 mg by mouth 2 (two) times daily with a meal.   Yes Historical Provider, MD  amoxicillin-clavulanate (AUGMENTIN) 875-125 MG per tablet Take 1 tablet by mouth every 12 (twelve) hours. Last dose November 16 in the evening. Patient not taking: Reported on 09/25/2014 07/16/14   [name], MD  fluticasone [facility]) 50 MCG/ACT nasal spray Place 2 sprays into the nose daily as needed. For allergies    Historical Provider, MD  risperiDONE (RISPERDAL) 1 MG tablet Take 1 tablet (1 mg total) by mouth 3 (three) times daily. 06/05/13 06/05/14  [name], MD  temazepam (RESTORIL) 15 MG capsule Take 15 mg by mouth at bedtime as needed for sleep.     Historical  Provider, MD    Scheduled Meds: . antiseptic oral rinse  7 mL Mouth Rinse BID  . baclofen  10 mg Oral TID  . Chlorhexidine Gluconate Cloth  6 each Topical Q0600  . feeding supplement (ENSURE COMPLETE)  237 mL Oral QHS  . mupirocin ointment  1 application Nasal BID  . QUEtiapine  200 mg Oral BID  . simvastatin  20 mg Oral QHS  . sodium chloride  3 mL Intravenous Q12H  . trihexyphenidyl  5 mg Oral BID WC  . valproate sodium  250 mg Intravenous Q8H   Continuous Infusions: . dexmedetomidine 0.7 mcg/kg/hr (09/28/14 0700)  . dextrose 5 % and 0.45% NaCl 125 mL/hr at 09/28/14 0700  . midazolam (VERSED) infusion Stopped (09/27/14 1124)   PRN Meds:.acetaminophen **OR** acetaminophen, labetalol, LORazepam, midazolam     Results for orders placed or performed during the hospital encounter of 09/25/14 (from the past 48 hour(s))  Basic metabolic panel     Status: Abnormal   Collection Time: 09/26/14  9:17 AM   Result Value Ref Range   Sodium 142 135 - 145 mmol/L    Comment: DELTA CHECK NOTED   Potassium 4.3 3.5 - 5.1 mmol/L   Chloride 111 96 - 112 mmol/L   CO2 27 19 - 32 mmol/L   Glucose, Bld 130 (H) 70 - 99 mg/dL   BUN 38 (H) 6 - 23 mg/dL   Creatinine, Ser 1.76 (H) 0.50 - 1.35 mg/dL   Calcium 8.5 8.4 - 10.5 mg/dL   GFR calc non Af Amer 41 (L) >90 mL/min   GFR calc Af Amer 48 (L) >90 mL/min    Comment: (NOTE) The eGFR has been calculated using the CKD EPI equation. This calculation has not been validated in all clinical situations. eGFR's persistently <90 mL/min signify possible Chronic Kidney Disease.    Anion gap 4 (L) 5 - 15  CBC     Status: Abnormal   Collection Time: 09/26/14  9:17 AM  Result Value Ref Range   WBC 4.2 4.0 - 10.5 K/uL   RBC 3.66 (L) 4.22 - 5.81 MIL/uL   Hemoglobin 11.3 (L) 13.0 - 17.0 g/dL   HCT 33.2 (L) 39.0 - 52.0 %   MCV 90.7 78.0 - 100.0 fL   MCH 30.9 26.0 - 34.0 pg   MCHC 34.0 30.0 - 36.0 g/dL   RDW 12.6 11.5 - 15.5 %   Platelets 79 (L) 150 - 400 K/uL    Comment: SPECIMEN CHECKED FOR CLOTS PLATELETS APPEAR DECREASED PLATELET COUNT CONFIRMED BY SMEAR   Glucose, capillary     Status: Abnormal   Collection Time: 09/26/14  9:47 AM  Result Value Ref Range   Glucose-Capillary 118 (H) 70 - 99 mg/dL   Comment 1 Notify RN   Glucose, capillary     Status: Abnormal   Collection Time: 09/26/14 11:44 AM  Result Value Ref Range   Glucose-Capillary 121 (H) 70 - 99 mg/dL   Comment 1 Notify RN   Glucose, capillary     Status: Abnormal   Collection Time: 09/26/14  1:59 PM  Result Value Ref Range   Glucose-Capillary 121 (H) 70 - 99 mg/dL  Glucose, capillary     Status: Abnormal   Collection Time: 09/26/14  4:26 PM  Result Value Ref Range   Glucose-Capillary 128 (H) 70 - 99 mg/dL   Comment 1 Notify RN   Glucose, capillary     Status: Abnormal   Collection Time: 09/26/14  6:10 PM  Result Value Ref Range   Glucose-Capillary 104 (H) 70 - 99 mg/dL   Comment 1  Notify RN   Glucose, capillary     Status: Abnormal   Collection Time: 09/26/14  8:30 PM  Result Value Ref Range   Glucose-Capillary 106 (H) 70 - 99 mg/dL  Glucose, capillary     Status: Abnormal   Collection Time: 09/27/14  7:18 AM  Result Value Ref Range   Glucose-Capillary 133 (H) 70 - 99 mg/dL  Basic metabolic panel     Status: Abnormal   Collection Time: 09/27/14 10:22 AM  Result Value Ref Range   Sodium 141 135 - 145 mmol/L   Potassium 4.1 3.5 - 5.1 mmol/L   Chloride 112 96 - 112 mmol/L   CO2 24 19 - 32 mmol/L   Glucose, Bld 111 (H) 70 - 99 mg/dL   BUN 22 6 - 23 mg/dL    Comment: DELTA CHECK NOTED   Creatinine, Ser 1.41 (H) 0.50 - 1.35 mg/dL   Calcium 8.3 (L) 8.4 - 10.5 mg/dL   GFR calc non Af Amer 54 (L) >90 mL/min   GFR calc Af Amer 62 (L) >90 mL/min    Comment: (NOTE) The eGFR has been calculated using the CKD EPI equation. This calculation has not been validated in all clinical situations. eGFR's persistently <90 mL/min signify possible Chronic Kidney Disease.    Anion gap 5 5 - 15  CBC with Differential/Platelet     Status: Abnormal   Collection Time: 09/27/14 10:22 AM  Result Value Ref Range   WBC 4.3 4.0 - 10.5 K/uL   RBC 3.50 (L) 4.22 - 5.81 MIL/uL   Hemoglobin 10.8 (L) 13.0 - 17.0 g/dL   HCT 31.2 (L) 39.0 - 52.0 %   MCV 89.1 78.0 - 100.0 fL   MCH 30.9 26.0 - 34.0 pg   MCHC 34.6 30.0 - 36.0 g/dL   RDW 12.4 11.5 - 15.5 %   Platelets 77 (L) 150 - 400 K/uL    Comment: SPECIMEN CHECKED FOR CLOTS PLATELET COUNT CONFIRMED BY SMEAR    Neutrophils Relative % 60 43 - 77 %   Neutro Abs 2.6 1.7 - 7.7 K/uL   Lymphocytes Relative 30 12 - 46 %   Lymphs Abs 1.3 0.7 - 4.0 K/uL   Monocytes Relative 7 3 - 12 %   Monocytes Absolute 0.3 0.1 - 1.0 K/uL   Eosinophils Relative 4 0 - 5 %   Eosinophils Absolute 0.2 0.0 - 0.7 K/uL   Basophils Relative 0 0 - 1 %   Basophils Absolute 0.0 0.0 - 0.1 K/uL  Glucose, capillary     Status: Abnormal   Collection Time: 09/27/14  11:22 AM  Result Value Ref Range   Glucose-Capillary 124 (H) 70 - 99 mg/dL  Glucose, capillary     Status: Abnormal   Collection Time: 09/27/14  4:07 PM  Result Value Ref Range   Glucose-Capillary 141 (H) 70 - 99 mg/dL  Glucose, capillary     Status: Abnormal   Collection Time: 09/27/14  9:41 PM  Result Value Ref Range   Glucose-Capillary 134 (H) 70 - 99 mg/dL   Comment 1 Notify RN     Studies/Results: HEAD CT 1. No acute intracranial abnormalities. 2. Fall right temporal occipital lobe infarct. Old left thalamic lacunar infarct. Stable appearance from the prior head CT.    Kima Malenfant [name], M.D.  Diplomate, Tax adviser of Psychiatry and Neurology ( Neurology). 09/28/2014, 8:07 AM

## 2014-09-28 NOTE — Progress Notes (Signed)
Patient has been on precedex at 0.7 since before shift change this morning. After patient was given ativan this am he turned himself sideways in bed and fell asleep. He has been asleep all day; however. responsive to touch. Heart rate has stayed around 56 beats per minute and 100% SPO2 oxygen saturation on room air. Has not awaken to take food or oral medication.

## 2014-09-29 LAB — BASIC METABOLIC PANEL
ANION GAP: 4 — AB (ref 5–15)
BUN: 13 mg/dL (ref 6–23)
CALCIUM: 8.3 mg/dL — AB (ref 8.4–10.5)
CO2: 27 mmol/L (ref 19–32)
CREATININE: 1.21 mg/dL (ref 0.50–1.35)
Chloride: 112 mmol/L (ref 96–112)
GFR calc non Af Amer: 65 mL/min — ABNORMAL LOW (ref >90)
GFR, EST AFRICAN AMERICAN: 75 mL/min — AB (ref >90)
Glucose, Bld: 149 mg/dL — ABNORMAL HIGH (ref 70–99)
Potassium: 4 mmol/L (ref 3.5–5.1)
Sodium: 143 mmol/L (ref 135–145)

## 2014-09-29 LAB — GLUCOSE, CAPILLARY
GLUCOSE-CAPILLARY: 86 mg/dL (ref 70–99)
Glucose-Capillary: 130 mg/dL — ABNORMAL HIGH (ref 70–99)
Glucose-Capillary: 75 mg/dL (ref 70–99)

## 2014-09-29 MED ORDER — INSULIN ASPART 100 UNIT/ML ~~LOC~~ SOLN: 0.0000 [IU] | Freq: Every day | SUBCUTANEOUS | Status: DC

## 2014-09-29 MED ORDER — INSULIN ASPART 100 UNIT/ML ~~LOC~~ SOLN
0.0000 [IU] | Freq: Three times a day (TID) | SUBCUTANEOUS | Status: DC
  Administered 2014-10-02 – 2014-10-03 (×2): 2 [IU] via SUBCUTANEOUS
  Administered 2014-10-03: 1 [IU] via SUBCUTANEOUS

## 2014-09-29 MED ORDER — CHLORPROMAZINE HCL 25 MG/ML IJ SOLN
50.0000 mg | Freq: Four times a day (QID) | INTRAMUSCULAR | Status: DC
  Administered 2014-09-29 – 2014-10-02 (×12): 50 mg via INTRAMUSCULAR
  Filled 2014-09-29 (×24): qty 2

## 2014-09-29 MED ORDER — HALOPERIDOL LACTATE 5 MG/ML IJ SOLN
5.0000 mg | Freq: Four times a day (QID) | INTRAMUSCULAR | Status: DC | PRN
  Administered 2014-09-29 – 2014-10-08 (×9): 5 mg via INTRAMUSCULAR
  Filled 2014-09-29 (×11): qty 1

## 2014-09-29 MED ORDER — CHLORPROMAZINE HCL 25 MG/ML IJ SOLN
INTRAMUSCULAR | Status: AC
  Filled 2014-09-29: qty 2

## 2014-09-29 MED ORDER — HALOPERIDOL 2 MG PO TABS
5.0000 mg | ORAL_TABLET | Freq: Two times a day (BID) | ORAL | Status: DC
  Administered 2014-09-29 – 2014-10-02 (×7): 5 mg via ORAL
  Filled 2014-09-29 (×7): qty 3

## 2014-09-29 NOTE — Progress Notes (Signed)
PT CONTINUES TO HAVE VERY FREQUENT EPISODES OF THRASHING,KCKING AND SWINGING HIS ARMS. THE LENGTH OF THE EPISODES ARE STARTING TO GET SHORTER.

## 2014-09-29 NOTE — Progress Notes (Addendum)
PT REFUSES TO TAKE FOOD OR MEDS PO. HE SPITS THEM BACK OUT. PT REMAINS RESTLESS AND THRASHING DESPITE HALDOL. HE DOES NOT LIKE TO BE TOUCHED. BRADYCARDIA HAS RESOLVED SINCE PRECEDEX DRIP STOPPED.

## 2014-09-29 NOTE — Care Management Note (Addendum)
    Page 1 of 1   10/13/2014     9:39:30 AM CARE MANAGEMENT NOTE 10/13/2014  Patient:  JATAVIAN, CALICA   Account Number:  [id number]  Date Initiated:  09/29/2014  Documentation initiated by:  CHILDRESS,JESSICA  Subjective/Objective Assessment:   PT is from [facility]. Pt plans to return to SNF at discharge. CSW aware and will arrange for return to facility. No CM needs at this time.     Action/Plan:   Anticipated DC Date:  10/02/2014   Anticipated DC Plan:  SKILLED NURSING FACILITY  In-house referral  Clinical Social Worker      DC Planning Services  CM consult      Choice offered to / List presented to:             Status of service:  Completed, signed off Medicare Important Message given?  YES (If response is "NO", the following Medicare IM given date fields will be blank) Date Medicare IM given:  10/02/2014 Medicare IM given by:  Jolene Provost Date Additional Medicare IM given:  10/13/2014 Additional Medicare IM given by:  JESSICA CHILDRESS  Discharge Disposition:  [place]  Per UR Regulation:    If discussed at Long Length of Stay Meetings, dates discussed:   10/01/2014  10/06/2014  10/13/2014    Comments:  10/13/2014 0900 Jolene Provost, RN, MSN, CM Pt to discharge back to SNF today with PICC line for IV abx. CSW has arranged for return to facility. No CM needs identified. 10/09/2014 [place], RN, MSN, CM Plan of care discussed with pt's Gardian and MD. Pt has no LTAC benefits. Will need to return to SNF when medically stable. CSW aware. 10/02/2014 [place], RN, MSN, CM 09/29/2014 [place], RN, MSN, CM

## 2014-09-29 NOTE — Progress Notes (Signed)
Subjective: Patient is currently sedated. He become combative whenever he wakes up. He is being evaluated by neurology. EEG done and result is pending.  Objective: Vital signs in last 24 hours: Temp:  [98 F (36.7 C)-98.5 F (36.9 C)] 98 F (36.7 C) (01/26 0400) Pulse Rate:  [42-77] 42 (01/26 0700) Resp:  [9-35] 11 (01/26 0700) BP: (100-191)/(55-95) 189/78 mmHg (01/26 0700) SpO2:  [93 %-100 %] 100 % (01/26 0700) Weight change:  Last BM Date: 09/28/14  Intake/Output from previous day: 01/25 0701 - 01/26 0700 In: 3531.5 [I.V.:3376.5; IV Piggyback:155] Out: 1003 [Urine:1775]  PHYSICAL EXAM General appearance: no distress and slowed mentation Resp: diminished breath sounds bilaterally and rhonchi bilaterally Cardio: S1, S2 normal GI: soft, non-tender; bowel sounds normal; no masses,  no organomegaly Extremities: extremities normal, atraumatic, no cyanosis or edema  Lab Results:  Results for orders placed or performed during the hospital encounter of 09/25/14 (from the past 48 hour(s))  Basic metabolic panel     Status: Abnormal   Collection Time: 09/27/14 10:22 AM  Result Value Ref Range   Sodium 141 135 - 145 mmol/L   Potassium 4.1 3.5 - 5.1 mmol/L   Chloride 112 96 - 112 mmol/L   CO2 24 19 - 32 mmol/L   Glucose, Bld 111 (H) 70 - 99 mg/dL   BUN 22 6 - 23 mg/dL    Comment: DELTA CHECK NOTED   Creatinine, Ser 1.41 (H) 0.50 - 1.35 mg/dL   Calcium 8.3 (L) 8.4 - 10.5 mg/dL   GFR calc non Af Amer 54 (L) >90 mL/min   GFR calc Af Amer 62 (L) >90 mL/min    Comment: (NOTE) The eGFR has been calculated using the CKD EPI equation. This calculation has not been validated in all clinical situations. eGFR's persistently <90 mL/min signify possible Chronic Kidney Disease.    Anion gap 5 5 - 15  CBC with Differential/Platelet     Status: Abnormal   Collection Time: 09/27/14 10:22 AM  Result Value Ref Range   WBC 4.3 4.0 - 10.5 K/uL   RBC 3.50 (L) 4.22 - 5.81 MIL/uL   Hemoglobin  10.8 (L) 13.0 - 17.0 g/dL   HCT 31.2 (L) 39.0 - 52.0 %   MCV 89.1 78.0 - 100.0 fL   MCH 30.9 26.0 - 34.0 pg   MCHC 34.6 30.0 - 36.0 g/dL   RDW 12.4 11.5 - 15.5 %   Platelets 77 (L) 150 - 400 K/uL    Comment: SPECIMEN CHECKED FOR CLOTS PLATELET COUNT CONFIRMED BY SMEAR    Neutrophils Relative % 60 43 - 77 %   Neutro Abs 2.6 1.7 - 7.7 K/uL   Lymphocytes Relative 30 12 - 46 %   Lymphs Abs 1.3 0.7 - 4.0 K/uL   Monocytes Relative 7 3 - 12 %   Monocytes Absolute 0.3 0.1 - 1.0 K/uL   Eosinophils Relative 4 0 - 5 %   Eosinophils Absolute 0.2 0.0 - 0.7 K/uL   Basophils Relative 0 0 - 1 %   Basophils Absolute 0.0 0.0 - 0.1 K/uL  Glucose, capillary     Status: Abnormal   Collection Time: 09/27/14 11:22 AM  Result Value Ref Range   Glucose-Capillary 124 (H) 70 - 99 mg/dL  Glucose, capillary     Status: Abnormal   Collection Time: 09/27/14  4:07 PM  Result Value Ref Range   Glucose-Capillary 141 (H) 70 - 99 mg/dL  Glucose, capillary     Status: Abnormal  Collection Time: 09/27/14  9:41 PM  Result Value Ref Range   Glucose-Capillary 134 (H) 70 - 99 mg/dL   Comment 1 Notify RN   Glucose, capillary     Status: Abnormal   Collection Time: 09/28/14 12:29 PM  Result Value Ref Range   Glucose-Capillary 148 (H) 70 - 99 mg/dL  Glucose, capillary     Status: Abnormal   Collection Time: 09/28/14 10:16 PM  Result Value Ref Range   Glucose-Capillary 151 (H) 70 - 99 mg/dL  Basic metabolic panel     Status: Abnormal   Collection Time: 09/29/14  4:20 AM  Result Value Ref Range   Sodium 143 135 - 145 mmol/L   Potassium 4.0 3.5 - 5.1 mmol/L   Chloride 112 96 - 112 mmol/L   CO2 27 19 - 32 mmol/L   Glucose, Bld 149 (H) 70 - 99 mg/dL   BUN 13 6 - 23 mg/dL   Creatinine, Ser 1.21 0.50 - 1.35 mg/dL   Calcium 8.3 (L) 8.4 - 10.5 mg/dL   GFR calc non Af Amer 65 (L) >90 mL/min   GFR calc Af Amer 75 (L) >90 mL/min    Comment: (NOTE) The eGFR has been calculated using the CKD EPI equation. This  calculation has not been validated in all clinical situations. eGFR's persistently <90 mL/min signify possible Chronic Kidney Disease.    Anion gap 4 (L) 5 - 15  Glucose, capillary     Status: Abnormal   Collection Time: 09/29/14  7:33 AM  Result Value Ref Range   Glucose-Capillary 130 (H) 70 - 99 mg/dL   Comment 1 Documented in Chart    Comment 2 Notify RN     ABGS No results for input(s): PHART, PO2ART, TCO2, HCO3 in the last 72 hours.  Invalid input(s): PCO2 CULTURES Recent Results (from the past 240 hour(s))  MRSA PCR Screening     Status: Abnormal   Collection Time: 09/25/14  2:57 PM  Result Value Ref Range Status   MRSA by PCR POSITIVE (A) NEGATIVE Final    Comment:        The GeneXpert MRSA Assay (FDA approved for NASAL specimens only), is one component of a comprehensive MRSA colonization surveillance program. It is not intended to diagnose MRSA infection nor to guide or monitor treatment for MRSA infections. RESULT CALLED TO, READ BACK BY AND VERIFIED WITH: NIELSON,T ON 09/25/14 AT 2105 BY LOY,C    Studies/Results: No results found.  Medications: I have reviewed the patient's current medications.  Assesment:   Principal Problem:   Hyperkalemia Active Problems:   Bipolar I disorder, most recent episode mixed   H/O: stroke   Dehydration   CVA, old, cognitive deficits   CKD (chronic kidney disease) stage 3, GFR 30-59 ml/min   Thrombocytopenia   Acute encephalopathy   Acute renal failure   Hypoglycemia    Plan:  Medications reviewed Will continue IV hydration Will monitor BMP Will follow neurology ecommendation.    LOS: 4 days   Derrick Butler 09/29/2014, 8:06 AM

## 2014-09-29 NOTE — Progress Notes (Addendum)
Pt constantly rolling his head back and forth.and curling his legs up under him. Often tries to talk without opening his mouth. Movement and thrashing is now almost constant. Pt is also grinding his teeth a lot.

## 2014-09-29 NOTE — Procedures (Signed)
Derrick A. [name], MD     [email or web address]           HISTORY: This is a 59 year old man who presents with episodes of confusion suspicious for seizure activity.  MEDICATIONS: Scheduled Meds: . antiseptic oral rinse  7 mL Mouth Rinse BID  . Chlorhexidine Gluconate Cloth  6 each Topical Q0600  . feeding supplement (ENSURE COMPLETE)  237 mL Oral QHS  . haloperidol  5 mg Oral BID  . insulin aspart  0-5 Units Subcutaneous QHS  . insulin aspart  0-9 Units Subcutaneous TID WC  . mupirocin ointment  1 application Nasal BID  . QUEtiapine  200 mg Oral BID  . simvastatin  20 mg Oral QHS  . sodium chloride  3 mL Intravenous Q12H  . trihexyphenidyl  5 mg Oral BID WC  . valproate sodium  500 mg Intravenous Q8H   Continuous Infusions: . dextrose 5 % and 0.45% NaCl 100 mL/hr at 09/29/14 1049   PRN Meds:.acetaminophen **OR** acetaminophen, haloperidol lactate, labetalol  Prior to Admission medications   Medication Sig Start Date End Date Taking? Authorizing Provider  amoxicillin (AMOXIL) 500 MG capsule Take 500 mg by mouth 3 (three) times daily.   Yes Historical Provider, MD  aspirin 325 MG tablet Take 325 mg by mouth daily.     Yes Historical Provider, MD  baclofen (LIORESAL) 10 MG tablet Take 10 mg by mouth 3 (three) times daily.   Yes Historical Provider, MD  cloNIDine (CATAPRES) 0.3 MG tablet Take 0.3 mg by mouth daily.     Yes Historical Provider, MD  divalproex (DEPAKOTE SPRINKLE) 125 MG capsule Take 250 mg by mouth 3 (three) times daily.   Yes Historical Provider, MD  feeding supplement, ENSURE COMPLETE, (ENSURE COMPLETE) LIQD Take 237 mLs by mouth at bedtime.   Yes Historical Provider, MD  ibuprofen (ADVIL,MOTRIN) 800 MG tablet Take 800 mg by mouth every 6 (six) hours as needed for moderate pain.   Yes Historical Provider, MD  labetalol (NORMODYNE) 200 MG tablet Take 200 mg by mouth 2 (two) times daily.     Yes Historical Provider, MD  QUEtiapine (SEROQUEL)  200 MG tablet Take 200 mg by mouth 2 (two) times daily.   Yes Historical Provider, MD  ramipril (ALTACE) 10 MG tablet Take 10 mg by mouth daily.     Yes Historical Provider, MD  simvastatin (ZOCOR) 20 MG tablet Take 20 mg by mouth at bedtime.    Yes Historical Provider, MD  trihexyphenidyl (ARTANE) 5 MG tablet Take 5 mg by mouth 2 (two) times daily with a meal.   Yes Historical Provider, MD  amoxicillin-clavulanate (AUGMENTIN) 875-125 MG per tablet Take 1 tablet by mouth every 12 (twelve) hours. Last dose November 16 in the evening. Patient not taking: Reported on 09/25/2014 07/16/14   Derrick Cota, MD  fluticasone [facility]) 50 MCG/ACT nasal spray Place 2 sprays into the nose daily as needed. For allergies    Historical Provider, MD  risperiDONE (RISPERDAL) 1 MG tablet Take 1 tablet (1 mg total) by mouth 3 (three) times daily. 06/05/13 06/05/14  Derrick Spiller, MD  temazepam (RESTORIL) 15 MG capsule Take 15 mg by mouth at bedtime as needed for sleep.     Historical Provider, MD      ANALYSIS: A 16 channel recording using standard 10 20 measurements is conducted for 23 minutes. The background activity is as high as 5-1/2 Hz bilaterally. Sleep architecture is observed with K complexes and sleep spindles. No  focal or lateralized slowing observed. There is no epileptiform activity observed.   IMPRESSION: This recording is abnormal showing moderate to severe generalized slowing indicating a moderate to severe generalized encephalopathy. However, there is no epileptiform activity observed.      Derrick Butler, M.D.  Diplomate, Tax adviser of Psychiatry and Neurology ( Neurology).

## 2014-09-29 NOTE — Plan of Care (Signed)
Problem: Phase I Progression Outcomes Goal: OOB as tolerated unless otherwise ordered Outcome: Progressing Pt has been up to [facility] once for BM when he had a brief period of clarity Goal: Voiding-avoid urinary catheter unless indicated Outcome: Not Progressing Pt is being sedated d/t frequent periods of thrashing and combativeness. Foley catheter is requred to monitor urinary output. Goal: Hemodynamically stable Outcome: Not Progressing Pt remaions hypertensive. Labetalol being given iv prn

## 2014-09-29 NOTE — Progress Notes (Signed)
Patient ID: Derrick Butler, male   DOB: 09-Dec-1955, 59 y.o.   MRN: [id number]  [place] A. [name], MD     [email or web address]          Derrick Butler is an 59 y.o. male.   Assessment/Plan:  1. Toxic metabolic encephalopathy most likely due to acute renal failure. Dehydration and medication effect compounded by reduced clearance from acute renal impairment are also contributing factors.  2. Agitated confusion and combativeness.  3. Baseline history of tremors. No evidence of tardive dyskinesia or parkinsonism at this time.  4. Baseline vascular dementia.  4. 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  Recommendations: 1. As needed doses of haloperidol. 2. Discontinue baclofen for now. 3. EEG--P. 4. Increase dose of Depakote. 5. HOLD ALL SEDATION AND USE PRN HALDOL.   Very combative and agitated of sedation.   GENERAL: Patient is quite drowsy and unresponsive at this time.  HEENT: Supple. Atraumatic normocephalic.   ABDOMEN: soft  EXTREMITIES: No edema. There are bandages/dressing seen involving multiple areas of the legs.   BACK: Normal.  SKIN: Normal by inspection.   MENTAL STATUS: The patient initially was deeply encephalopathic/stuporous with no eye opening even to deep painful stimuli. Several minutes later he actually opened his eyes spontaneously. He focused and tracked but did not follow commands. The patient started thrashing around vigorously and it was quite restless.  CRANIAL NERVES: Pupils are large 6 mm but reactive. Initially pupils were deviated downwards. After the patient woke up, he has full extraocular movements. Coronary reflexes are intact. The patient seemed to not to respond to direct threat bilaterally suggestive of possible visual field impairment. Facial muscle strength symmetric.  MOTOR: Bulk and tone are good. The patient moves both sides vigorously.  COORDINATION: No tremors, dystonia or dysmetria  noted. No rigidity, bradykinesia or parkinsonism observed.  SENSATION: Normal to deep painful stimuli.   Brain CT scan is reviewed in person. There are large cortical infarcts involving 2 areas in particular. These are the right PCA and the classic distribution involving the occipital area and extending to the medial temporal area. There is also distinct area of encephalomalacia involving the right parietal frontal region essentially involving the posterior watershed area. There is a encephalomalacia involving the inferior cerebellum on the right. There is also lacunar infarct that is old involving the left potamen. Nothing acute is seen.       Objective: Vital signs in last 24 hours: Temp:  [98 F (36.7 C)-98.5 F (36.9 C)] 98 F (36.7 C) (01/26 0400) Pulse Rate:  [42-77] 45 (01/26 0800) Resp:  [9-35] 15 (01/26 0800) BP: (100-191)/(55-95) 187/63 mmHg (01/26 0800) SpO2:  [93 %-100 %] 100 % (01/26 0800)  Intake/Output from previous day: 01/25 0701 - 01/26 0700 In: 3531.5 [I.V.:3376.5; IV Piggyback:155] Out: 6222 [Urine:1775] Intake/Output this shift:   Nutritional status: Diet regular   Lab Results: Results for orders placed or performed during the hospital encounter of 09/25/14 (from the past 48 hour(s))  Basic metabolic panel     Status: Abnormal   Collection Time: 09/27/14 10:22 AM  Result Value Ref Range   Sodium 141 135 - 145 mmol/L   Potassium 4.1 3.5 - 5.1 mmol/L   Chloride 112 96 - 112 mmol/L   CO2 24 19 - 32 mmol/L   Glucose, Bld 111 (H) 70 - 99 mg/dL   BUN 22 6 - 23 mg/dL    Comment: DELTA CHECK NOTED   Creatinine, Ser 1.41 (H)  0.50 - 1.35 mg/dL   Calcium 8.3 (L) 8.4 - 10.5 mg/dL   GFR calc non Af Amer 54 (L) >90 mL/min   GFR calc Af Amer 62 (L) >90 mL/min    Comment: (NOTE) The eGFR has been calculated using the CKD EPI equation. This calculation has not been validated in all clinical situations. eGFR's persistently <90 mL/min signify possible Chronic  Kidney Disease.    Anion gap 5 5 - 15  CBC with Differential/Platelet     Status: Abnormal   Collection Time: 09/27/14 10:22 AM  Result Value Ref Range   WBC 4.3 4.0 - 10.5 K/uL   RBC 3.50 (L) 4.22 - 5.81 MIL/uL   Hemoglobin 10.8 (L) 13.0 - 17.0 g/dL   HCT 31.2 (L) 39.0 - 52.0 %   MCV 89.1 78.0 - 100.0 fL   MCH 30.9 26.0 - 34.0 pg   MCHC 34.6 30.0 - 36.0 g/dL   RDW 12.4 11.5 - 15.5 %   Platelets 77 (L) 150 - 400 K/uL    Comment: SPECIMEN CHECKED FOR CLOTS PLATELET COUNT CONFIRMED BY SMEAR    Neutrophils Relative % 60 43 - 77 %   Neutro Abs 2.6 1.7 - 7.7 K/uL   Lymphocytes Relative 30 12 - 46 %   Lymphs Abs 1.3 0.7 - 4.0 K/uL   Monocytes Relative 7 3 - 12 %   Monocytes Absolute 0.3 0.1 - 1.0 K/uL   Eosinophils Relative 4 0 - 5 %   Eosinophils Absolute 0.2 0.0 - 0.7 K/uL   Basophils Relative 0 0 - 1 %   Basophils Absolute 0.0 0.0 - 0.1 K/uL  Glucose, capillary     Status: Abnormal   Collection Time: 09/27/14 11:22 AM  Result Value Ref Range   Glucose-Capillary 124 (H) 70 - 99 mg/dL  Glucose, capillary     Status: Abnormal   Collection Time: 09/27/14  4:07 PM  Result Value Ref Range   Glucose-Capillary 141 (H) 70 - 99 mg/dL  Glucose, capillary     Status: Abnormal   Collection Time: 09/27/14  9:41 PM  Result Value Ref Range   Glucose-Capillary 134 (H) 70 - 99 mg/dL   Comment 1 Notify RN   Glucose, capillary     Status: Abnormal   Collection Time: 09/28/14 12:29 PM  Result Value Ref Range   Glucose-Capillary 148 (H) 70 - 99 mg/dL  Glucose, capillary     Status: Abnormal   Collection Time: 09/28/14 10:16 PM  Result Value Ref Range   Glucose-Capillary 151 (H) 70 - 99 mg/dL  Basic metabolic panel     Status: Abnormal   Collection Time: 09/29/14  4:20 AM  Result Value Ref Range   Sodium 143 135 - 145 mmol/L   Potassium 4.0 3.5 - 5.1 mmol/L   Chloride 112 96 - 112 mmol/L   CO2 27 19 - 32 mmol/L   Glucose, Bld 149 (H) 70 - 99 mg/dL   BUN 13 6 - 23 mg/dL   Creatinine,  Ser 1.21 0.50 - 1.35 mg/dL   Calcium 8.3 (L) 8.4 - 10.5 mg/dL   GFR calc non Af Amer 65 (L) >90 mL/min   GFR calc Af Amer 75 (L) >90 mL/min    Comment: (NOTE) The eGFR has been calculated using the CKD EPI equation. This calculation has not been validated in all clinical situations. eGFR's persistently <90 mL/min signify possible Chronic Kidney Disease.    Anion gap 4 (L) 5 - 15  Glucose, capillary  Status: Abnormal   Collection Time: 09/29/14  7:33 AM  Result Value Ref Range   Glucose-Capillary 130 (H) 70 - 99 mg/dL   Comment 1 Documented in Chart    Comment 2 Notify RN     Lipid Panel No results for input(s): CHOL, TRIG, HDL, CHOLHDL, VLDL, LDLCALC in the last 72 hours.  Studies/Results:   Medications:  Scheduled Meds: . antiseptic oral rinse  7 mL Mouth Rinse BID  . Chlorhexidine Gluconate Cloth  6 each Topical Q0600  . feeding supplement (ENSURE COMPLETE)  237 mL Oral QHS  . mupirocin ointment  1 application Nasal BID  . QUEtiapine  200 mg Oral BID  . simvastatin  20 mg Oral QHS  . sodium chloride  3 mL Intravenous Q12H  . trihexyphenidyl  5 mg Oral BID WC  . valproate sodium  500 mg Intravenous Q8H   Continuous Infusions: . dextrose 5 % and 0.45% NaCl 125 mL/hr at 09/29/14 0700   PRN Meds:.acetaminophen **OR** acetaminophen, haloperidol lactate, labetalol     LOS: 4 days   Elisse Pennick A. [name], M.D.  Diplomate, Tax adviser of Psychiatry and Neurology ( Neurology).

## 2014-09-30 LAB — CBC
HEMATOCRIT: 33.6 % — AB (ref 39.0–52.0)
HEMOGLOBIN: 11.3 g/dL — AB (ref 13.0–17.0)
MCH: 29.8 pg (ref 26.0–34.0)
MCHC: 33.6 g/dL (ref 30.0–36.0)
MCV: 88.7 fL (ref 78.0–100.0)
Platelets: 75 10*3/uL — ABNORMAL LOW (ref 150–400)
RBC: 3.79 MIL/uL — ABNORMAL LOW (ref 4.22–5.81)
RDW: 12.5 % (ref 11.5–15.5)
WBC: 4.7 10*3/uL (ref 4.0–10.5)

## 2014-09-30 LAB — BASIC METABOLIC PANEL
ANION GAP: 5 (ref 5–15)
BUN: 13 mg/dL (ref 6–23)
CO2: 28 mmol/L (ref 19–32)
Calcium: 8.5 mg/dL (ref 8.4–10.5)
Chloride: 110 mmol/L (ref 96–112)
Creatinine, Ser: 1.27 mg/dL (ref 0.50–1.35)
GFR calc Af Amer: 71 mL/min — ABNORMAL LOW (ref >90)
GFR, EST NON AFRICAN AMERICAN: 61 mL/min — AB (ref >90)
Glucose, Bld: 92 mg/dL (ref 70–99)
Potassium: 3.9 mmol/L (ref 3.5–5.1)
SODIUM: 143 mmol/L (ref 135–145)

## 2014-09-30 LAB — GLUCOSE, CAPILLARY
GLUCOSE-CAPILLARY: 84 mg/dL (ref 70–99)
Glucose-Capillary: 79 mg/dL (ref 70–99)
Glucose-Capillary: 89 mg/dL (ref 70–99)
Glucose-Capillary: 86 mg/dL (ref 70–99)
Glucose-Capillary: 107 mg/dL — ABNORMAL HIGH (ref 70–99)

## 2014-09-30 NOTE — Progress Notes (Signed)
Subjective: Patient remained confused and disoriented and moving around the bed. He is receiving haldol PRN. EEG showed no seizure but has generalized slowing down.  Objective: Vital signs in last 24 hours: Temp:  [97.2 F (36.2 C)-98.6 F (37 C)] 97.4 F (36.3 C) (01/27 0500) Pulse Rate:  [29-144] 84 (01/27 0700) Resp:  [10-46] 26 (01/27 0700) BP: (86-192)/(43-151) 158/74 mmHg (01/27 0700) SpO2:  [83 %-100 %] 100 % (01/27 0700) Weight change:  Last BM Date: 09/28/14  Intake/Output from previous day: 01/26 0701 - 01/27 0700 In: 2601.3 [P.O.:90; I.V.:2351.3; IV Piggyback:160] Out: 1850 [Urine:1850]  PHYSICAL EXAM General appearance: no distress and slowed mentation Resp: diminished breath sounds bilaterally and rhonchi bilaterally Cardio: S1, S2 normal GI: soft, non-tender; bowel sounds normal; no masses,  no organomegaly Extremities: extremities normal, atraumatic, no cyanosis or edema  Lab Results:  Results for orders placed or performed during the hospital encounter of 09/25/14 (from the past 48 hour(s))  Glucose, capillary     Status: Abnormal   Collection Time: 09/28/14 12:29 PM  Result Value Ref Range   Glucose-Capillary 148 (H) 70 - 99 mg/dL  Glucose, capillary     Status: Abnormal   Collection Time: 09/28/14 10:16 PM  Result Value Ref Range   Glucose-Capillary 151 (H) 70 - 99 mg/dL  Basic metabolic panel     Status: Abnormal   Collection Time: 09/29/14  4:20 AM  Result Value Ref Range   Sodium 143 135 - 145 mmol/L   Potassium 4.0 3.5 - 5.1 mmol/L   Chloride 112 96 - 112 mmol/L   CO2 27 19 - 32 mmol/L   Glucose, Bld 149 (H) 70 - 99 mg/dL   BUN 13 6 - 23 mg/dL   Creatinine, Ser 1.21 0.50 - 1.35 mg/dL   Calcium 8.3 (L) 8.4 - 10.5 mg/dL   GFR calc non Af Amer 65 (L) >90 mL/min   GFR calc Af Amer 75 (L) >90 mL/min    Comment: (NOTE) The eGFR has been calculated using the CKD EPI equation. This calculation has not been validated in all clinical  situations. eGFR's persistently <90 mL/min signify possible Chronic Kidney Disease.    Anion gap 4 (L) 5 - 15  Glucose, capillary     Status: Abnormal   Collection Time: 09/29/14  7:33 AM  Result Value Ref Range   Glucose-Capillary 130 (H) 70 - 99 mg/dL   Comment 1 Documented in Chart    Comment 2 Notify RN   Glucose, capillary     Status: None   Collection Time: 09/29/14 11:36 AM  Result Value Ref Range   Glucose-Capillary 86 70 - 99 mg/dL   Comment 1 Documented in Chart    Comment 2 Notify RN   Glucose, capillary     Status: None   Collection Time: 09/29/14  4:25 PM  Result Value Ref Range   Glucose-Capillary 75 70 - 99 mg/dL   Comment 1 Documented in Chart    Comment 2 Notify RN   Glucose, capillary     Status: None   Collection Time: 09/29/14  9:29 PM  Result Value Ref Range   Glucose-Capillary 79 70 - 99 mg/dL  Basic metabolic panel     Status: Abnormal   Collection Time: 09/30/14  4:53 AM  Result Value Ref Range   Sodium 143 135 - 145 mmol/L   Potassium 3.9 3.5 - 5.1 mmol/L   Chloride 110 96 - 112 mmol/L   CO2 28 19 - 32  mmol/L   Glucose, Bld 92 70 - 99 mg/dL   BUN 13 6 - 23 mg/dL   Creatinine, Ser 1.27 0.50 - 1.35 mg/dL   Calcium 8.5 8.4 - 10.5 mg/dL   GFR calc non Af Amer 61 (L) >90 mL/min   GFR calc Af Amer 71 (L) >90 mL/min    Comment: (NOTE) The eGFR has been calculated using the CKD EPI equation. This calculation has not been validated in all clinical situations. eGFR's persistently <90 mL/min signify possible Chronic Kidney Disease.    Anion gap 5 5 - 15  CBC     Status: Abnormal   Collection Time: 09/30/14  4:53 AM  Result Value Ref Range   WBC 4.7 4.0 - 10.5 K/uL   RBC 3.79 (L) 4.22 - 5.81 MIL/uL   Hemoglobin 11.3 (L) 13.0 - 17.0 g/dL   HCT 33.6 (L) 39.0 - 52.0 %   MCV 88.7 78.0 - 100.0 fL   MCH 29.8 26.0 - 34.0 pg   MCHC 33.6 30.0 - 36.0 g/dL   RDW 12.5 11.5 - 15.5 %   Platelets 75 (L) 150 - 400 K/uL    Comment: SPECIMEN CHECKED FOR  CLOTS CONSISTENT WITH PREVIOUS RESULT   Glucose, capillary     Status: None   Collection Time: 09/30/14  7:42 AM  Result Value Ref Range   Glucose-Capillary 84 70 - 99 mg/dL    ABGS No results for input(s): PHART, PO2ART, TCO2, HCO3 in the last 72 hours.  Invalid input(s): PCO2 CULTURES Recent Results (from the past 240 hour(s))  MRSA PCR Screening     Status: Abnormal   Collection Time: 09/25/14  2:57 PM  Result Value Ref Range Status   MRSA by PCR POSITIVE (A) NEGATIVE Final    Comment:        The GeneXpert MRSA Assay (FDA approved for NASAL specimens only), is one component of a comprehensive MRSA colonization surveillance program. It is not intended to diagnose MRSA infection nor to guide or monitor treatment for MRSA infections. RESULT CALLED TO, READ BACK BY AND VERIFIED WITH: NIELSON,T ON 09/25/14 AT 2105 BY LOY,C    Studies/Results: No results found.  Medications: I have reviewed the patient's current medications.  Assesment:   Principal Problem:   Hyperkalemia Active Problems:   Bipolar I disorder, most recent episode mixed   H/O: stroke   Dehydration   CVA, old, cognitive deficits   CKD (chronic kidney disease) stage 3, GFR 30-59 ml/min   Thrombocytopenia   Acute encephalopathy   Acute renal failure   Hypoglycemia    Plan:  Medications reviewed Will adjust IV fluid Will monitor BMP Will follow neurology ecommendation.    LOS: 5 days   Derrick Butler 09/30/2014, 7:47 AM

## 2014-09-30 NOTE — Progress Notes (Signed)
Patient ID: Derrick Butler, male   DOB: Jun 12, 1956, 59 y.o.   MRN: [id number]  Derrick A. [name], MD     [email or web address]          Derrick Butler is an 59 y.o. male.   Assessment/Plan:  1. Toxic metabolic encephalopathy most likely due to acute renal failure. Dehydration and medication effect compounded by reduced clearance from acute renal impairment are also contributing factors.  2. Agitated confusion and combativeness.  3. Baseline history of tremors. No evidence of tardive dyskinesia or parkinsonism at this time.  4. Baseline vascular dementia.  4. 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  Recommendations: 1. As needed doses of haloperidol. 2. Discontinue baclofen for now. 3. EEG--P. 4. Increase dose of Depakote. 5. HOLD ALL SEDATION except haldol and thorazine.   The patient is more awake and responsive but is having a lot of vigorous movements although movements have actually gotten better with medications prescribed. He is on Haldol around the clock and also for breakthrough. He also is on Thorazine. We'll continue with this regimen.   GENERAL: Patient is quite drowsy and unresponsive at this time.  HEENT: Supple. Atraumatic normocephalic.   ABDOMEN: soft  EXTREMITIES: No edema. There are bandages/dressing seen involving multiple areas of the legs.   BACK: Normal.  SKIN: Normal by inspection.   MENTAL STATUS: Eyes are closed. He moves around vigorously at times but this seems better. He states that he is okay. He follows midline commands. No rigidity or parkinsonism observed.  CRANIAL NERVES: Pupils are large 6 mm but reactive. Initially pupils were deviated downwards. After the patient woke up, he has full extraocular movements. Coronary reflexes are intact. The patient seemed to not to respond to direct threat bilaterally suggestive of possible visual field impairment. Facial muscle strength symmetric.  MOTOR:  Bulk and tone are good. The patient moves both sides vigorously.  COORDINATION: No tremors, dystonia or dysmetria noted. No rigidity, bradykinesia or parkinsonism observed.  SENSATION: Normal to deep painful stimuli.   Brain CT scan is reviewed in person. There are large cortical infarcts involving 2 areas in particular. These are the right PCA and the classic distribution involving the occipital area and extending to the medial temporal area. There is also distinct area of encephalomalacia involving the right parietal frontal region essentially involving the posterior watershed area. There is a encephalomalacia involving the inferior cerebellum on the right. There is also lacunar infarct that is old involving the left potamen. Nothing acute is seen.       Objective: Vital signs in last 24 hours: Temp:  [97.4 F (36.3 C)-98.6 F (37 C)] 97.4 F (36.3 C) (01/27 0500) Pulse Rate:  [29-144] 95 (01/27 1300) Resp:  [10-54] 36 (01/27 1300) BP: (86-176)/(43-151) 176/132 mmHg (01/27 1300) SpO2:  [83 %-100 %] 97 % (01/27 1300)  Intake/Output from previous day: 01/26 0701 - 01/27 0700 In: 2601.3 [P.O.:90; I.V.:2351.3; IV Piggyback:160] Out: 1850 [Urine:1850] Intake/Output this shift: Total I/O In: 615.4 [I.V.:615.4] Out: -  Nutritional status: Diet Carb Modified   Lab Results: Results for orders placed or performed during the hospital encounter of 09/25/14 (from the past 48 hour(s))  Glucose, capillary     Status: Abnormal   Collection Time: 09/28/14 10:16 PM  Result Value Ref Range   Glucose-Capillary 151 (H) 70 - 99 mg/dL  Basic metabolic panel     Status: Abnormal   Collection Time: 09/29/14  4:20 AM  Result Value Ref Range  Sodium 143 135 - 145 mmol/L   Potassium 4.0 3.5 - 5.1 mmol/L   Chloride 112 96 - 112 mmol/L   CO2 27 19 - 32 mmol/L   Glucose, Bld 149 (H) 70 - 99 mg/dL   BUN 13 6 - 23 mg/dL   Creatinine, Ser 1.21 0.50 - 1.35 mg/dL   Calcium 8.3 (L) 8.4 - 10.5 mg/dL    GFR calc non Af Amer 65 (L) >90 mL/min   GFR calc Af Amer 75 (L) >90 mL/min    Comment: (NOTE) The eGFR has been calculated using the CKD EPI equation. This calculation has not been validated in all clinical situations. eGFR's persistently <90 mL/min signify possible Chronic Kidney Disease.    Anion gap 4 (L) 5 - 15  Glucose, capillary     Status: Abnormal   Collection Time: 09/29/14  7:33 AM  Result Value Ref Range   Glucose-Capillary 130 (H) 70 - 99 mg/dL   Comment 1 Documented in Chart    Comment 2 Notify RN   Glucose, capillary     Status: None   Collection Time: 09/29/14 11:36 AM  Result Value Ref Range   Glucose-Capillary 86 70 - 99 mg/dL   Comment 1 Documented in Chart    Comment 2 Notify RN   Glucose, capillary     Status: None   Collection Time: 09/29/14  4:25 PM  Result Value Ref Range   Glucose-Capillary 75 70 - 99 mg/dL   Comment 1 Documented in Chart    Comment 2 Notify RN   Glucose, capillary     Status: None   Collection Time: 09/29/14  9:29 PM  Result Value Ref Range   Glucose-Capillary 79 70 - 99 mg/dL  Basic metabolic panel     Status: Abnormal   Collection Time: 09/30/14  4:53 AM  Result Value Ref Range   Sodium 143 135 - 145 mmol/L   Potassium 3.9 3.5 - 5.1 mmol/L   Chloride 110 96 - 112 mmol/L   CO2 28 19 - 32 mmol/L   Glucose, Bld 92 70 - 99 mg/dL   BUN 13 6 - 23 mg/dL   Creatinine, Ser 1.27 0.50 - 1.35 mg/dL   Calcium 8.5 8.4 - 10.5 mg/dL   GFR calc non Af Amer 61 (L) >90 mL/min   GFR calc Af Amer 71 (L) >90 mL/min    Comment: (NOTE) The eGFR has been calculated using the CKD EPI equation. This calculation has not been validated in all clinical situations. eGFR's persistently <90 mL/min signify possible Chronic Kidney Disease.    Anion gap 5 5 - 15  CBC     Status: Abnormal   Collection Time: 09/30/14  4:53 AM  Result Value Ref Range   WBC 4.7 4.0 - 10.5 K/uL   RBC 3.79 (L) 4.22 - 5.81 MIL/uL   Hemoglobin 11.3 (L) 13.0 - 17.0 g/dL    HCT 33.6 (L) 39.0 - 52.0 %   MCV 88.7 78.0 - 100.0 fL   MCH 29.8 26.0 - 34.0 pg   MCHC 33.6 30.0 - 36.0 g/dL   RDW 12.5 11.5 - 15.5 %   Platelets 75 (L) 150 - 400 K/uL    Comment: SPECIMEN CHECKED FOR CLOTS CONSISTENT WITH PREVIOUS RESULT   Glucose, capillary     Status: None   Collection Time: 09/30/14  7:42 AM  Result Value Ref Range   Glucose-Capillary 84 70 - 99 mg/dL  Glucose, capillary     Status: None  Collection Time: 09/30/14 11:55 AM  Result Value Ref Range   Glucose-Capillary 89 70 - 99 mg/dL   Comment 1 Notify RN     Lipid Panel No results for input(s): CHOL, TRIG, HDL, CHOLHDL, VLDL, LDLCALC in the last 72 hours.  Studies/Results:   Medications:  Scheduled Meds: . antiseptic oral rinse  7 mL Mouth Rinse BID  . chlorproMAZINE (THORAZINE) injection  50 mg Intramuscular 4 times per day  . feeding supplement (ENSURE COMPLETE)  237 mL Oral QHS  . haloperidol  5 mg Oral BID  . insulin aspart  0-5 Units Subcutaneous QHS  . insulin aspart  0-9 Units Subcutaneous TID WC  . mupirocin ointment  1 application Nasal BID  . QUEtiapine  200 mg Oral BID  . simvastatin  20 mg Oral QHS  . sodium chloride  3 mL Intravenous Q12H  . trihexyphenidyl  5 mg Oral BID WC  . valproate sodium  500 mg Intravenous Q8H   Continuous Infusions: . dextrose 5 % and 0.45% NaCl 75 mL/hr at 09/30/14 0933   PRN Meds:.acetaminophen **OR** acetaminophen, haloperidol lactate, labetalol     LOS: 5 days   Shantea Poulton [name], M.D.  Diplomate, Tax adviser of Psychiatry and Neurology ( Neurology).

## 2014-10-01 LAB — GLUCOSE, CAPILLARY
GLUCOSE-CAPILLARY: 105 mg/dL — AB (ref 70–99)
Glucose-Capillary: 120 mg/dL — ABNORMAL HIGH (ref 70–99)
Glucose-Capillary: 100 mg/dL — ABNORMAL HIGH (ref 70–99)
Glucose-Capillary: 153 mg/dL — ABNORMAL HIGH (ref 70–99)

## 2014-10-01 NOTE — Progress Notes (Signed)
Patient had condom cath placed at midnight. At 0600 patient still had not voided. Patient aroused and asked if he had to pee, patient said yes, told patient to go ahead, patient sat in be straining to void with no result. Bladder scan completed which revealed greater than 577mls. Dr Cindie Laroche paged with a prompt call back updated on patients condition and results of bladder scan. Orders received to reinsert foley.

## 2014-10-01 NOTE — Care Management Utilization Note (Signed)
UR completed 

## 2014-10-01 NOTE — Progress Notes (Signed)
Patient ID: Derrick Butler, male   DOB: [birth year], 59 y.o.   MRN: [id number]  [place] A. Derrick Laughter, MD     [email or web address]          Derrick Butler is an 59 y.o. male.   Assessment/Plan:  1. Toxic metabolic encephalopathy most likely due to acute renal failure. Dehydration and medication effect compounded by reduced clearance from acute renal impairment are also contributing factors.  2. Agitated confusion and combativeness.  3. Baseline history of tremors. No evidence of tardive dyskinesia or parkinsonism at this time.  4. Baseline vascular dementia.  4. 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  Recommendations: Continue with current meds for now.  The patient is more awake and responsive. Now taking more foods and meds PO. Less combative and less   GENERAL: Patient is quite drowsy and unresponsive at this time.  HEENT: Supple. Atraumatic normocephalic.   ABDOMEN: soft  EXTREMITIES: No edema. There are bandages/dressing seen involving multiple areas of the legs.   BACK: Normal.  SKIN: Normal by inspection.   MENTAL STATUS: Eyes are closed. Less movements today. He moves around vigorously at times but this seems better. He states that he is okay. He does not follows commands.   CRANIAL NERVES: Pupils are large 6 mm but reactive. Initially pupils were deviated downwards. After the patient woke up, he has full extraocular movements. Coronary reflexes are intact. The patient seemed to not to respond to direct threat bilaterally suggestive of possible visual field impairment. Facial muscle strength symmetric.  MOTOR: Bulk and tone are good. The patient moves both sides vigorously.  COORDINATION: No tremors, dystonia or dysmetria noted. No rigidity, bradykinesia or parkinsonism observed.   SENSATION: Normal to deep painful stimuli.      Objective: Vital signs in last 24 hours: Temp:  [97.3 F (36.3 C)-98.7 F (37.1 C)]  98.7 F (37.1 C) (01/28 1644) Pulse Rate:  [86-108] 99 (01/28 1300) Resp:  [14-26] 22 (01/28 1300) BP: (99-165)/(57-132) 141/110 mmHg (01/28 1300) SpO2:  [91 %-100 %] 100 % (01/28 1300)  Intake/Output from previous day: 01/27 0701 - 01/28 0700 In: 2064.7 [I.V.:1899.7; IV Piggyback:165] Out: 250 [Urine:250] Intake/Output this shift:   Nutritional status: Diet Carb Modified   Lab Results: Results for orders placed or performed during the hospital encounter of 09/25/14 (from the past 48 hour(s))  Glucose, capillary     Status: None   Collection Time: 09/29/14  9:29 PM  Result Value Ref Range   Glucose-Capillary 79 70 - 99 mg/dL  Basic metabolic panel     Status: Abnormal   Collection Time: 09/30/14  4:53 AM  Result Value Ref Range   Sodium 143 135 - 145 mmol/L   Potassium 3.9 3.5 - 5.1 mmol/L   Chloride 110 96 - 112 mmol/L   CO2 28 19 - 32 mmol/L   Glucose, Bld 92 70 - 99 mg/dL   BUN 13 6 - 23 mg/dL   Creatinine, Ser 1.27 0.50 - 1.35 mg/dL   Calcium 8.5 8.4 - 10.5 mg/dL   GFR calc non Af Amer 61 (L) >90 mL/min   GFR calc Af Amer 71 (L) >90 mL/min    Comment: (NOTE) The eGFR has been calculated using the CKD EPI equation. This calculation has not been validated in all clinical situations. eGFR's persistently <90 mL/min signify possible Chronic Kidney Disease.    Anion gap 5 5 - 15  CBC     Status: Abnormal   Collection  Time: 09/30/14  4:53 AM  Result Value Ref Range   WBC 4.7 4.0 - 10.5 K/uL   RBC 3.79 (L) 4.22 - 5.81 MIL/uL   Hemoglobin 11.3 (L) 13.0 - 17.0 g/dL   HCT 33.6 (L) 39.0 - 52.0 %   MCV 88.7 78.0 - 100.0 fL   MCH 29.8 26.0 - 34.0 pg   MCHC 33.6 30.0 - 36.0 g/dL   RDW 12.5 11.5 - 15.5 %   Platelets 75 (L) 150 - 400 K/uL    Comment: SPECIMEN CHECKED FOR CLOTS CONSISTENT WITH PREVIOUS RESULT   Glucose, capillary     Status: None   Collection Time: 09/30/14  7:42 AM  Result Value Ref Range   Glucose-Capillary 84 70 - 99 mg/dL  Glucose, capillary      Status: None   Collection Time: 09/30/14 11:55 AM  Result Value Ref Range   Glucose-Capillary 89 70 - 99 mg/dL   Comment 1 Notify RN   Glucose, capillary     Status: None   Collection Time: 09/30/14  4:48 PM  Result Value Ref Range   Glucose-Capillary 86 70 - 99 mg/dL   Comment 1 Notify RN   Glucose, capillary     Status: Abnormal   Collection Time: 09/30/14  9:40 PM  Result Value Ref Range   Glucose-Capillary 107 (H) 70 - 99 mg/dL   Comment 1 Notify RN   Glucose, capillary     Status: Abnormal   Collection Time: 10/01/14  7:40 AM  Result Value Ref Range   Glucose-Capillary 105 (H) 70 - 99 mg/dL   Comment 1 Notify RN   Glucose, capillary     Status: Abnormal   Collection Time: 10/01/14 11:29 AM  Result Value Ref Range   Glucose-Capillary 120 (H) 70 - 99 mg/dL   Comment 1 Notify RN   Glucose, capillary     Status: Abnormal   Collection Time: 10/01/14  4:22 PM  Result Value Ref Range   Glucose-Capillary 100 (H) 70 - 99 mg/dL   Comment 1 Notify RN     Lipid Panel No results for input(s): CHOL, TRIG, HDL, CHOLHDL, VLDL, LDLCALC in the last 72 hours.  Studies/Results:   Medications:  Scheduled Meds: . antiseptic oral rinse  7 mL Mouth Rinse BID  . chlorproMAZINE (THORAZINE) injection  50 mg Intramuscular 4 times per day  . feeding supplement (ENSURE COMPLETE)  237 mL Oral QHS  . haloperidol  5 mg Oral BID  . insulin aspart  0-5 Units Subcutaneous QHS  . insulin aspart  0-9 Units Subcutaneous TID WC  . QUEtiapine  200 mg Oral BID  . simvastatin  20 mg Oral QHS  . sodium chloride  3 mL Intravenous Q12H  . valproate sodium  500 mg Intravenous Q8H   Continuous Infusions: . dextrose 5 % and 0.45% NaCl 75 mL/hr at 10/01/14 1511   PRN Meds:.acetaminophen **OR** acetaminophen, haloperidol lactate, labetalol     LOS: 6 days   Elvina Bosch A. Derrick Butler, M.D.  Diplomate, Tax adviser of Psychiatry and Neurology ( Neurology).

## 2014-10-01 NOTE — Progress Notes (Signed)
Subjective: Patient remained in the same condition. He remained sedated. He becomes combative whenever, he wakes up. His po intake is very poor. His renal function has improved.. Objective: Vital signs in last 24 hours: Temp:  [97.3 F (36.3 C)-98.2 F (36.8 C)] 97.3 F (36.3 C) (01/28 0400) Pulse Rate:  [84-115] 88 (01/28 0600) Resp:  [12-54] 14 (01/28 0600) BP: (97-176)/(57-132) 145/80 mmHg (01/28 0600) SpO2:  [91 %-100 %] 97 % (01/28 0600) Weight change:  Last BM Date: 09/28/14  Intake/Output from previous day: 01/27 0701 - 01/28 0700 In: 2064.7 [I.V.:1899.7; IV Piggyback:165] Out: 250 [Urine:250]  PHYSICAL EXAM General appearance: no distress and slowed mentation Resp: diminished breath sounds bilaterally and rhonchi bilaterally Cardio: S1, S2 normal GI: soft, non-tender; bowel sounds normal; no masses,  no organomegaly Extremities: extremities normal, atraumatic, no cyanosis or edema  Lab Results:  Results for orders placed or performed during the hospital encounter of 09/25/14 (from the past 48 hour(s))  Glucose, capillary     Status: None   Collection Time: 09/29/14 11:36 AM  Result Value Ref Range   Glucose-Capillary 86 70 - 99 mg/dL   Comment 1 Documented in Chart    Comment 2 Notify RN   Glucose, capillary     Status: None   Collection Time: 09/29/14  4:25 PM  Result Value Ref Range   Glucose-Capillary 75 70 - 99 mg/dL   Comment 1 Documented in Chart    Comment 2 Notify RN   Glucose, capillary     Status: None   Collection Time: 09/29/14  9:29 PM  Result Value Ref Range   Glucose-Capillary 79 70 - 99 mg/dL  Basic metabolic panel     Status: Abnormal   Collection Time: 09/30/14  4:53 AM  Result Value Ref Range   Sodium 143 135 - 145 mmol/L   Potassium 3.9 3.5 - 5.1 mmol/L   Chloride 110 96 - 112 mmol/L   CO2 28 19 - 32 mmol/L   Glucose, Bld 92 70 - 99 mg/dL   BUN 13 6 - 23 mg/dL   Creatinine, Ser 1.27 0.50 - 1.35 mg/dL   Calcium 8.5 8.4 - 10.5 mg/dL   GFR calc non Af Amer 61 (L) >90 mL/min   GFR calc Af Amer 71 (L) >90 mL/min    Comment: (NOTE) The eGFR has been calculated using the CKD EPI equation. This calculation has not been validated in all clinical situations. eGFR's persistently <90 mL/min signify possible Chronic Kidney Disease.    Anion gap 5 5 - 15  CBC     Status: Abnormal   Collection Time: 09/30/14  4:53 AM  Result Value Ref Range   WBC 4.7 4.0 - 10.5 K/uL   RBC 3.79 (L) 4.22 - 5.81 MIL/uL   Hemoglobin 11.3 (L) 13.0 - 17.0 g/dL   HCT 33.6 (L) 39.0 - 52.0 %   MCV 88.7 78.0 - 100.0 fL   MCH 29.8 26.0 - 34.0 pg   MCHC 33.6 30.0 - 36.0 g/dL   RDW 12.5 11.5 - 15.5 %   Platelets 75 (L) 150 - 400 K/uL    Comment: SPECIMEN CHECKED FOR CLOTS CONSISTENT WITH PREVIOUS RESULT   Glucose, capillary     Status: None   Collection Time: 09/30/14  7:42 AM  Result Value Ref Range   Glucose-Capillary 84 70 - 99 mg/dL  Glucose, capillary     Status: None   Collection Time: 09/30/14 11:55 AM  Result Value Ref Range  Glucose-Capillary 89 70 - 99 mg/dL   Comment 1 Notify RN   Glucose, capillary     Status: None   Collection Time: 09/30/14  4:48 PM  Result Value Ref Range   Glucose-Capillary 86 70 - 99 mg/dL   Comment 1 Notify RN   Glucose, capillary     Status: Abnormal   Collection Time: 09/30/14  9:40 PM  Result Value Ref Range   Glucose-Capillary 107 (H) 70 - 99 mg/dL   Comment 1 Notify RN     ABGS No results for input(s): PHART, PO2ART, TCO2, HCO3 in the last 72 hours.  Invalid input(s): PCO2 CULTURES Recent Results (from the past 240 hour(s))  MRSA PCR Screening     Status: Abnormal   Collection Time: 09/25/14  2:57 PM  Result Value Ref Range Status   MRSA by PCR POSITIVE (A) NEGATIVE Final    Comment:        The GeneXpert MRSA Assay (FDA approved for NASAL specimens only), is one component of a comprehensive MRSA colonization surveillance program. It is not intended to diagnose MRSA infection nor to guide  or monitor treatment for MRSA infections. RESULT CALLED TO, READ BACK BY AND VERIFIED WITH: NIELSON,T ON 09/25/14 AT 2105 BY LOY,C    Studies/Results: No results found.  Medications: I have reviewed the patient's current medications.  Assesment:   Principal Problem:   Hyperkalemia Active Problems:   Bipolar I disorder, most recent episode mixed   H/O: stroke   Dehydration   CVA, old, cognitive deficits   CKD (chronic kidney disease) stage 3, GFR 30-59 ml/min   Thrombocytopenia   Acute encephalopathy   Acute renal failure   Hypoglycemia    Plan:  Medications reviewed Continue IV fluid Will monitor BMP Will follow neurology ecommendation.    LOS: 6 days   Derrick Butler 10/01/2014, 7:52 AM

## 2014-10-02 LAB — BASIC METABOLIC PANEL
Anion gap: 4 — ABNORMAL LOW (ref 5–15)
BUN: 15 mg/dL (ref 6–23)
CHLORIDE: 109 mmol/L (ref 96–112)
CO2: 28 mmol/L (ref 19–32)
Calcium: 8 mg/dL — ABNORMAL LOW (ref 8.4–10.5)
Creatinine, Ser: 1.31 mg/dL (ref 0.50–1.35)
GFR, EST AFRICAN AMERICAN: 68 mL/min — AB (ref >90)
GFR, EST NON AFRICAN AMERICAN: 59 mL/min — AB (ref >90)
Glucose, Bld: 177 mg/dL — ABNORMAL HIGH (ref 70–99)
POTASSIUM: 3.4 mmol/L — AB (ref 3.5–5.1)
Sodium: 141 mmol/L (ref 135–145)

## 2014-10-02 LAB — GLUCOSE, CAPILLARY
GLUCOSE-CAPILLARY: 130 mg/dL — AB (ref 70–99)
GLUCOSE-CAPILLARY: 150 mg/dL — AB (ref 70–99)
Glucose-Capillary: 165 mg/dL — ABNORMAL HIGH (ref 70–99)
Glucose-Capillary: 93 mg/dL (ref 70–99)

## 2014-10-02 MED ORDER — HALOPERIDOL 2 MG PO TABS
5.0000 mg | ORAL_TABLET | Freq: Three times a day (TID) | ORAL | Status: DC
  Administered 2014-10-02 – 2014-10-08 (×12): 5 mg via ORAL
  Filled 2014-10-02 (×14): qty 3

## 2014-10-02 MED ORDER — CHLORPROMAZINE HCL 25 MG/ML IJ SOLN
50.0000 mg | Freq: Three times a day (TID) | INTRAMUSCULAR | Status: DC | PRN
  Administered 2014-10-02 – 2014-10-08 (×10): 50 mg via INTRAMUSCULAR
  Filled 2014-10-02 (×12): qty 2

## 2014-10-02 NOTE — Clinical Social Work Note (Signed)
CSW spoke with Ivin Booty at [facility]. Ivin Booty indicated that patient's legal guardian had not placed a bed hold on patient's bed.  She indicated that patient can come back to the facility when discharged if they have a bed available. She stated that she hoped that the would have a bed available upon patient's discharge as he had been with the facility for an extended period.     Ambrose Pancoast, [place]

## 2014-10-02 NOTE — Progress Notes (Signed)
[place] WAKEUP AND EAT AND DRINK. HE ONLY ATE 4 BITES OF FOOD AND DRANK 3 SWALLOWS OF COLA. THEN WENT BACK TO SLEEP.PT WAS LAUGHING AND SMILING DURING VERY BRIEF  PERIOD OF AWAKFULL NESS. MOVING SELF AROUND IN BED OCCASIONALLY.TREMORS AND JERKING OF EXTREMITIES WHEN AWAKE. OFTEN DOSE THE OPPOSITE OF WHAT HE IS INSTRUCTED TO DO. SPEECH ALTERNATES BETWEEN CLEAR AND GRABBLED. DRAWS LEGS TOWARD ABDOMEN WHEN STARTING TO AROUSE.

## 2014-10-02 NOTE — Progress Notes (Signed)
Subjective: Patient is still sedated. According to nursing he slightly better. He is taking some more po feeding.. Objective: Vital signs in last 24 hours: Temp:  [97.3 F (36.3 C)-98.7 F (37.1 C)] 97.3 F (36.3 C) (01/29 0700) Pulse Rate:  [84-107] 91 (01/29 0700) Resp:  [14-26] 20 (01/29 0700) BP: (99-185)/(53-132) 103/53 mmHg (01/29 0700) SpO2:  [96 %-100 %] 97 % (01/29 0700) Weight change:  Last BM Date: 09/28/14  Intake/Output from previous day: 01/28 0701 - 01/29 0700 In: 1958.8 [I.V.:1793.8; IV Piggyback:160] Out: 975 [Urine:975]  PHYSICAL EXAM General appearance: no distress and slowed mentation Resp: diminished breath sounds bilaterally and rhonchi bilaterally Cardio: S1, S2 normal GI: soft, non-tender; bowel sounds normal; no masses,  no organomegaly Extremities: extremities normal, atraumatic, no cyanosis or edema  Lab Results:  Results for orders placed or performed during the hospital encounter of 09/25/14 (from the past 48 hour(s))  Glucose, capillary     Status: None   Collection Time: 09/30/14 11:55 AM  Result Value Ref Range   Glucose-Capillary 89 70 - 99 mg/dL   Comment 1 Notify RN   Glucose, capillary     Status: None   Collection Time: 09/30/14  4:48 PM  Result Value Ref Range   Glucose-Capillary 86 70 - 99 mg/dL   Comment 1 Notify RN   Glucose, capillary     Status: Abnormal   Collection Time: 09/30/14  9:40 PM  Result Value Ref Range   Glucose-Capillary 107 (H) 70 - 99 mg/dL   Comment 1 Notify RN   Glucose, capillary     Status: Abnormal   Collection Time: 10/01/14  7:40 AM  Result Value Ref Range   Glucose-Capillary 105 (H) 70 - 99 mg/dL   Comment 1 Notify RN   Glucose, capillary     Status: Abnormal   Collection Time: 10/01/14 11:29 AM  Result Value Ref Range   Glucose-Capillary 120 (H) 70 - 99 mg/dL   Comment 1 Notify RN   Glucose, capillary     Status: Abnormal   Collection Time: 10/01/14  4:22 PM  Result Value Ref Range   Glucose-Capillary 100 (H) 70 - 99 mg/dL   Comment 1 Notify RN   Glucose, capillary     Status: Abnormal   Collection Time: 10/01/14 10:28 PM  Result Value Ref Range   Glucose-Capillary 153 (H) 70 - 99 mg/dL    ABGS No results for input(s): PHART, PO2ART, TCO2, HCO3 in the last 72 hours.  Invalid input(s): PCO2 CULTURES Recent Results (from the past 240 hour(s))  MRSA PCR Screening     Status: Abnormal   Collection Time: 09/25/14  2:57 PM  Result Value Ref Range Status   MRSA by PCR POSITIVE (A) NEGATIVE Final    Comment:        The GeneXpert MRSA Assay (FDA approved for NASAL specimens only), is one component of a comprehensive MRSA colonization surveillance program. It is not intended to diagnose MRSA infection nor to guide or monitor treatment for MRSA infections. RESULT CALLED TO, READ BACK BY AND VERIFIED WITH: NIELSON,T ON 09/25/14 AT 2105 BY LOY,C    Studies/Results: No results found.  Medications: I have reviewed the patient's current medications.  Assesment:   Principal Problem:   Hyperkalemia Active Problems:   Bipolar I disorder, most recent episode mixed   H/O: stroke   Dehydration   CVA, old, cognitive deficits   CKD (chronic kidney disease) stage 3, GFR 30-59 ml/min   Thrombocytopenia  Acute encephalopathy   Acute renal failure   Hypoglycemia    Plan:  Medications reviewed Continue IV fluid Will monitor BMP Will follow neurology ecommendation.    LOS: 7 days   Sharne Linders 10/02/2014, 8:03 AM

## 2014-10-02 NOTE — Plan of Care (Signed)
Problem: Phase I Progression Outcomes Goal: Voiding-avoid urinary catheter unless indicated Outcome: Not Progressing DUE TO URINARY RETENTION, FOLEY CATHETER REMAINS IN PLACE. ALSO PT IS DIFFICULT TO AROUSE AT TIMES. Goal: Other Phase I Outcomes/Goals Outcome: Not Progressing POTENTIAL FOR SELF INJURY: PT CONTINUES TO HAVE SEVERAL TREMORS AND COMBATIVENESS WHEN AWAKE.  Problem: Phase II Progression Outcomes Goal: Discharge plan established Outcome: Not Progressing PT'S NEUROLOGICAL CONDITION REMAINS  UNCERITAN . UNALBE TO DETERMINE IF CHANGE IN MEDICATION IS BEING EFFECTIVE.

## 2014-10-02 NOTE — Progress Notes (Signed)
Patient ID: Derrick Butler, male   DOB: 1956-02-02, 59 y.o.   MRN: [id number]  [employer] [facility] A. [name], MD     [email or web address]          Derrick Butler is an 59 y.o. male.   Assessment/Plan:  1. Toxic metabolic encephalopathy most likely due to acute renal failure. Dehydration and medication effect compounded by reduced clearance from acute renal impairment are also contributing factors.  2. Agitated confusion and combativeness.  3. Baseline history of tremors. No evidence of tardive dyskinesia or parkinsonism at this time.  4. Baseline vascular dementia.  4. 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  Recommendations: We will increase haldol and make thorazine prn   The patient is more awake and responsive. Now taking more foods and meds PO. Less combative and less   GENERAL: Patient is quite drowsy and unresponsive at this time.  HEENT: Supple. Atraumatic normocephalic.   ABDOMEN: soft  EXTREMITIES: No edema. There are bandages/dressing seen involving multiple areas of the legs.   BACK: Normal.  SKIN: Normal by inspection.   MENTAL STATUS: Eyes are closed. Less movements today. He moves around vigorously at times but this seems better. He states that he is okay. He does follows midline commands.   CRANIAL NERVES: Pupils are large 6 mm but reactive. Initially pupils were deviated downwards. After the patient woke up, he has full extraocular movements. Coronary reflexes are intact. The patient seemed to not to respond to direct threat bilaterally suggestive of possible visual field impairment. Facial muscle strength symmetric.  MOTOR: Bulk and tone are good. The patient moves both sides vigorously.  COORDINATION: No tremors, dystonia or dysmetria noted. No rigidity, bradykinesia or parkinsonism observed.   SENSATION: Normal to deep painful stimuli.      Objective: Vital signs in last 24 hours: Temp:  [97.3 F (36.3 C)-98.6  F (37 C)] 98.6 F (37 C) (01/29 1628) Pulse Rate:  [84-104] 95 (01/29 1400) Resp:  [13-24] 19 (01/29 1400) BP: (103-165)/(53-122) 120/75 mmHg (01/29 1400) SpO2:  [95 %-99 %] 97 % (01/29 1400)  Intake/Output from previous day: 01/28 0701 - 01/29 0700 In: 1958.8 [I.V.:1793.8; IV Piggyback:160] Out: 975 [Urine:975] Intake/Output this shift: Total I/O In: 890 [P.O.:90; I.V.:600; Other:150; IV Piggyback:50] Out: -  Nutritional status: Diet Carb Modified   Lab Results: Results for orders placed or performed during the hospital encounter of 09/25/14 (from the past 48 hour(s))  Glucose, capillary     Status: Abnormal   Collection Time: 09/30/14  9:40 PM  Result Value Ref Range   Glucose-Capillary 107 (H) 70 - 99 mg/dL   Comment 1 Notify RN   Glucose, capillary     Status: Abnormal   Collection Time: 10/01/14  7:40 AM  Result Value Ref Range   Glucose-Capillary 105 (H) 70 - 99 mg/dL   Comment 1 Notify RN   Glucose, capillary     Status: Abnormal   Collection Time: 10/01/14 11:29 AM  Result Value Ref Range   Glucose-Capillary 120 (H) 70 - 99 mg/dL   Comment 1 Notify RN   Glucose, capillary     Status: Abnormal   Collection Time: 10/01/14  4:22 PM  Result Value Ref Range   Glucose-Capillary 100 (H) 70 - 99 mg/dL   Comment 1 Notify RN   Glucose, capillary     Status: Abnormal   Collection Time: 10/01/14 10:28 PM  Result Value Ref Range   Glucose-Capillary 153 (H) 70 - 99 mg/dL  Glucose, capillary     Status: Abnormal   Collection Time: 10/02/14  7:10 AM  Result Value Ref Range   Glucose-Capillary 165 (H) 70 - 99 mg/dL  Basic metabolic panel     Status: Abnormal   Collection Time: 10/02/14  7:44 AM  Result Value Ref Range   Sodium 141 135 - 145 mmol/L   Potassium 3.4 (L) 3.5 - 5.1 mmol/L   Chloride 109 96 - 112 mmol/L   CO2 28 19 - 32 mmol/L   Glucose, Bld 177 (H) 70 - 99 mg/dL   BUN 15 6 - 23 mg/dL   Creatinine, Ser 1.31 0.50 - 1.35 mg/dL   Calcium 8.0 (L) 8.4 - 10.5  mg/dL   GFR calc non Af Amer 59 (L) >90 mL/min   GFR calc Af Amer 68 (L) >90 mL/min    Comment: (NOTE) The eGFR has been calculated using the CKD EPI equation. This calculation has not been validated in all clinical situations. eGFR's persistently <90 mL/min signify possible Chronic Kidney Disease.    Anion gap 4 (L) 5 - 15  Glucose, capillary     Status: None   Collection Time: 10/02/14 12:34 PM  Result Value Ref Range   Glucose-Capillary 93 70 - 99 mg/dL  Glucose, capillary     Status: Abnormal   Collection Time: 10/02/14  4:15 PM  Result Value Ref Range   Glucose-Capillary 130 (H) 70 - 99 mg/dL   Comment 1 Notify RN    Comment 2 Documented in Chart     Lipid Panel No results for input(s): CHOL, TRIG, HDL, CHOLHDL, VLDL, LDLCALC in the last 72 hours.  Studies/Results:   Medications:  Scheduled Meds: . antiseptic oral rinse  7 mL Mouth Rinse BID  . chlorproMAZINE (THORAZINE) injection  50 mg Intramuscular 4 times per day  . feeding supplement (ENSURE COMPLETE)  237 mL Oral QHS  . haloperidol  5 mg Oral BID  . insulin aspart  0-5 Units Subcutaneous QHS  . insulin aspart  0-9 Units Subcutaneous TID WC  . QUEtiapine  200 mg Oral BID  . simvastatin  20 mg Oral QHS  . sodium chloride  3 mL Intravenous Q12H  . valproate sodium  500 mg Intravenous Q8H   Continuous Infusions: . dextrose 5 % and 0.45% NaCl 75 mL/hr at 10/02/14 1400   PRN Meds:.acetaminophen **OR** acetaminophen, haloperidol lactate, labetalol     LOS: 7 days   Darby Shadwick A. [name], M.D.  Diplomate, Tax adviser of Psychiatry and Neurology ( Neurology).

## 2014-10-03 ENCOUNTER — Inpatient Hospital Stay (HOSPITAL_COMMUNITY): Payer: Medicare Other

## 2014-10-03 LAB — GLUCOSE, CAPILLARY
GLUCOSE-CAPILLARY: 136 mg/dL — AB (ref 70–99)
GLUCOSE-CAPILLARY: 132 mg/dL — AB (ref 70–99)
Glucose-Capillary: 142 mg/dL — ABNORMAL HIGH (ref 70–99)
Glucose-Capillary: 124 mg/dL — ABNORMAL HIGH (ref 70–99)

## 2014-10-03 MED ORDER — INSULIN ASPART 100 UNIT/ML ~~LOC~~ SOLN
0.0000 [IU] | SUBCUTANEOUS | Status: DC
  Administered 2014-10-03: 1 [IU] via SUBCUTANEOUS
  Administered 2014-10-04 (×2): 2 [IU] via SUBCUTANEOUS
  Administered 2014-10-04: 1 [IU] via SUBCUTANEOUS
  Administered 2014-10-04: 2 [IU] via SUBCUTANEOUS
  Administered 2014-10-05: 1 [IU] via SUBCUTANEOUS
  Administered 2014-10-05: 2 [IU] via SUBCUTANEOUS
  Administered 2014-10-05: 1 [IU] via SUBCUTANEOUS

## 2014-10-03 NOTE — Progress Notes (Signed)
Subjective: Patient is more awake but remained confused and disoreinted. He continue to be confused and disoriented. Patient is spiking fever. No cough, nausea or vomiting. Objective: Vital signs in last 24 hours: Temp:  [98.6 F (37 C)-101.2 F (38.4 C)] 101.2 F (38.4 C) (01/30 0750) Pulse Rate:  [85-126] 103 (01/30 0830) Resp:  [13-29] 22 (01/30 0830) BP: (117-192)/(62-168) 175/83 mmHg (01/30 0800) SpO2:  [95 %-100 %] 99 % (01/30 0830) Weight change:  Last BM Date: 10/03/14  Intake/Output from previous day: 01/29 0701 - 01/30 0700 In: 2368.8 [P.O.:150; I.V.:1908.8; IV Piggyback:160] Out: 100 [Urine:100]  PHYSICAL EXAM General appearance: no distress and slowed mentation Resp: diminished breath sounds bilaterally and rhonchi bilaterally Cardio: S1, S2 normal GI: soft, non-tender; bowel sounds normal; no masses,  no organomegaly Extremities: extremities normal, atraumatic, no cyanosis or edema  Lab Results:  Results for orders placed or performed during the hospital encounter of 09/25/14 (from the past 48 hour(s))  Glucose, capillary     Status: Abnormal   Collection Time: 10/01/14 11:29 AM  Result Value Ref Range   Glucose-Capillary 120 (H) 70 - 99 mg/dL   Comment 1 Notify RN   Glucose, capillary     Status: Abnormal   Collection Time: 10/01/14  4:22 PM  Result Value Ref Range   Glucose-Capillary 100 (H) 70 - 99 mg/dL   Comment 1 Notify RN   Glucose, capillary     Status: Abnormal   Collection Time: 10/01/14 10:28 PM  Result Value Ref Range   Glucose-Capillary 153 (H) 70 - 99 mg/dL  Glucose, capillary     Status: Abnormal   Collection Time: 10/02/14  7:10 AM  Result Value Ref Range   Glucose-Capillary 165 (H) 70 - 99 mg/dL  Basic metabolic panel     Status: Abnormal   Collection Time: 10/02/14  7:44 AM  Result Value Ref Range   Sodium 141 135 - 145 mmol/L   Potassium 3.4 (L) 3.5 - 5.1 mmol/L   Chloride 109 96 - 112 mmol/L   CO2 28 19 - 32 mmol/L   Glucose, Bld  177 (H) 70 - 99 mg/dL   BUN 15 6 - 23 mg/dL   Creatinine, Ser 1.31 0.50 - 1.35 mg/dL   Calcium 8.0 (L) 8.4 - 10.5 mg/dL   GFR calc non Af Amer 59 (L) >90 mL/min   GFR calc Af Amer 68 (L) >90 mL/min    Comment: (NOTE) The eGFR has been calculated using the CKD EPI equation. This calculation has not been validated in all clinical situations. eGFR's persistently <90 mL/min signify possible Chronic Kidney Disease.    Anion gap 4 (L) 5 - 15  Glucose, capillary     Status: None   Collection Time: 10/02/14 12:34 PM  Result Value Ref Range   Glucose-Capillary 93 70 - 99 mg/dL  Glucose, capillary     Status: Abnormal   Collection Time: 10/02/14  4:15 PM  Result Value Ref Range   Glucose-Capillary 130 (H) 70 - 99 mg/dL   Comment 1 Notify RN    Comment 2 Documented in Chart   Glucose, capillary     Status: Abnormal   Collection Time: 10/02/14  8:34 PM  Result Value Ref Range   Glucose-Capillary 150 (H) 70 - 99 mg/dL   Comment 1 Notify RN   Glucose, capillary     Status: Abnormal   Collection Time: 10/03/14  7:51 AM  Result Value Ref Range   Glucose-Capillary 142 (H) 70 -  99 mg/dL   Comment 1 Documented in Chart    Comment 2 Notify RN     ABGS No results for input(s): PHART, PO2ART, TCO2, HCO3 in the last 72 hours.  Invalid input(s): PCO2 CULTURES Recent Results (from the past 240 hour(s))  MRSA PCR Screening     Status: Abnormal   Collection Time: 09/25/14  2:57 PM  Result Value Ref Range Status   MRSA by PCR POSITIVE (A) NEGATIVE Final    Comment:        The GeneXpert MRSA Assay (FDA approved for NASAL specimens only), is one component of a comprehensive MRSA colonization surveillance program. It is not intended to diagnose MRSA infection nor to guide or monitor treatment for MRSA infections. RESULT CALLED TO, READ BACK BY AND VERIFIED WITH: NIELSON,T ON 09/25/14 AT 2105 BY LOY,C    Studies/Results: No results found.  Medications: I have reviewed the patient's  current medications.  Assesment:   Principal Problem:   Hyperkalemia Active Problems:   Bipolar I disorder, most recent episode mixed   H/O: stroke   Dehydration   CVA, old, cognitive deficits   CKD (chronic kidney disease) stage 3, GFR 30-59 ml/min   Thrombocytopenia   Acute encephalopathy   Acute renal failure   Hypoglycemia    Plan:  Medications reviewed Continue IV fluid Will do blood culture, urine culture and chest x-ray Will follow neurology ecommendation.    LOS: 8 days   , 10/03/2014, 9:40 AM   

## 2014-10-03 NOTE — Progress Notes (Signed)
PT ATE 8 BITES OF LUNCH TODAY. HE HAS DIFFICULTY CONTROLLING BODY MOVEMENTS TO COORDINATE HIMSELF TO KEEP HEAD STILL AND OPEN MOUTH TO RECEIVE FOOD.Marland KitchenDIFFICULTY FOCUSING TO DRINK FROM STRAW AT TIMES.AWAKE PERIODS ARE SHORT. CONTINUES TO THRASH ROUND FREQUENTLY.

## 2014-10-03 NOTE — Plan of Care (Signed)
Problem: Phase I Progression Outcomes Goal: Other Phase I Outcomes/Goals Outcome: Not Met (add Reason) PT HAVING MUCH DIFFICULTY TRYING TO EAT AND DRINK.UNABLE TO CONTROL TREMOR,JERKING AND MOUTH WHEN TRYING TO CONSUME PO.

## 2014-10-03 NOTE — Plan of Care (Signed)
Problem: Phase I Progression Outcomes Goal: OOB as tolerated unless otherwise ordered Outcome: Not Progressing PT NOT AWAKE ENOUGH TO SAFELY TRY TO GET OOB. MAY ATTEMPT TTO GET UP IN CHAIR W/ HOYER LIFT IN AM

## 2014-10-03 NOTE — Plan of Care (Signed)
Problem: Phase I Progression Outcomes Goal: Voiding-avoid urinary catheter unless indicated Outcome: Not Met (add Reason) PT CONTINUES TO REQUIRE CATHETER FOR URINARY RETENTION.

## 2014-10-04 LAB — GLUCOSE, CAPILLARY
GLUCOSE-CAPILLARY: 142 mg/dL — AB (ref 70–99)
GLUCOSE-CAPILLARY: 158 mg/dL — AB (ref 70–99)
GLUCOSE-CAPILLARY: 162 mg/dL — AB (ref 70–99)
GLUCOSE-CAPILLARY: 85 mg/dL (ref 70–99)
Glucose-Capillary: 132 mg/dL — ABNORMAL HIGH (ref 70–99)
Glucose-Capillary: 156 mg/dL — ABNORMAL HIGH (ref 70–99)

## 2014-10-04 LAB — URINE CULTURE
COLONY COUNT: NO GROWTH
Culture: NO GROWTH

## 2014-10-04 MED ORDER — PIPERACILLIN-TAZOBACTAM 3.375 G IVPB
3.3750 g | Freq: Three times a day (TID) | INTRAVENOUS | Status: DC
  Administered 2014-10-04 – 2014-10-05 (×4): 3.375 g via INTRAVENOUS
  Filled 2014-10-04 (×7): qty 50

## 2014-10-04 MED ORDER — SODIUM CHLORIDE 0.9 % IV SOLN
500.0000 mg | INTRAVENOUS | Status: AC
  Administered 2014-10-04: 500 mg via INTRAVENOUS
  Filled 2014-10-04: qty 500

## 2014-10-04 MED ORDER — VANCOMYCIN HCL IN DEXTROSE 750-5 MG/150ML-% IV SOLN
750.0000 mg | Freq: Two times a day (BID) | INTRAVENOUS | Status: DC
  Administered 2014-10-04 – 2014-10-05 (×2): 750 mg via INTRAVENOUS
  Filled 2014-10-04 (×4): qty 150

## 2014-10-04 MED ORDER — VANCOMYCIN HCL 500 MG IV SOLR
500.0000 mg | Freq: Once | INTRAVENOUS | Status: AC
  Administered 2014-10-04: 500 mg via INTRAVENOUS
  Filled 2014-10-04: qty 500

## 2014-10-04 MED ORDER — PIPERACILLIN-TAZOBACTAM 3.375 G IVPB
INTRAVENOUS | Status: AC
  Filled 2014-10-04: qty 50

## 2014-10-04 MED ORDER — VANCOMYCIN HCL 500 MG IV SOLR
INTRAVENOUS | Status: AC
  Filled 2014-10-04: qty 500

## 2014-10-04 MED ORDER — PIPERACILLIN-TAZOBACTAM 3.375 G IVPB
3.3750 g | Freq: Once | INTRAVENOUS | Status: AC
  Administered 2014-10-04: 3.375 g via INTRAVENOUS
  Filled 2014-10-04: qty 50

## 2014-10-04 NOTE — Progress Notes (Signed)
ANTIBIOTIC CONSULT NOTE-Preliminary  Pharmacy Consult for Vancomycin and Zosyn Indication: Bacteremia No Known Allergies  Patient Measurements: Height: 5\' 9"  (175.3 cm) Weight: 130 lb 1.1 oz (59 kg) IBW/kg (Calculated) : 70.7  Vital Signs: Temp: 102.6 F (39.2 C) (01/31 0000) Temp Source: Axillary (01/31 0000) BP: 154/66 mmHg (01/31 0300) Pulse Rate: 93 (01/31 0300)  Labs:  Recent Labs  10/02/14 0744  CREATININE 1.31    Estimated Creatinine Clearance: 51.9 mL/min (by C-G formula based on Cr of 1.31).  No results for input(s): VANCOTROUGH, VANCOPEAK, VANCORANDOM, GENTTROUGH, GENTPEAK, GENTRANDOM, TOBRATROUGH, TOBRAPEAK, TOBRARND, AMIKACINPEAK, AMIKACINTROU, AMIKACIN in the last 72 hours.   Microbiology: Recent Results (from the past 720 hour(s))  MRSA PCR Screening     Status: Abnormal   Collection Time: 09/25/14  2:57 PM  Result Value Ref Range Status   MRSA by PCR POSITIVE (A) NEGATIVE Final    Comment:        The GeneXpert MRSA Assay (FDA approved for NASAL specimens only), is one component of a comprehensive MRSA colonization surveillance program. It is not intended to diagnose MRSA infection nor to guide or monitor treatment for MRSA infections. RESULT CALLED TO, READ BACK BY AND VERIFIED WITH: NIELSON,T ON 09/25/14 AT 2105 BY LOY,C   Culture, blood (routine x 2)     Status: None (Preliminary result)   Collection Time: 10/03/14  9:58 AM  Result Value Ref Range Status   Specimen Description RIGHT ANTECUBITAL  Final   Special Requests BOTTLES DRAWN AEROBIC AND ANAEROBIC  Final   Culture   Final    GRAM POSITIVE COCCI IN CLUSTERS Gram Stain Report Called to,Read Back By and Verified With: LEE,B @ 0330 ON 10/04/14 BY WOODIE,J GS DONE @ [facility]    Report Status PENDING  Incomplete  Culture, blood (routine x 2)     Status: None (Preliminary result)   Collection Time: 10/03/14  9:59 AM  Result Value Ref Range Status   Specimen Description BLOOD RIGHT ARM  Final    Special Requests BOTTLES DRAWN AEROBIC AND ANAEROBIC  Final   Culture   Final    GRAM POSITIVE COCCI IN CLUSTERS Gram Stain Report Called to,Read Back By and Verified With: BREWER,D @ 2335 ON 10/03/14 BY WOODIE,J GS DONE @ [facility]    Report Status PENDING  Incomplete    Medical History: Past Medical History  Diagnosis Date  . Aortic dissection   . Hypertension   . CVA (cerebral infarction)   . Aortic dissection   . Hypertension   . CVA (cerebral infarction)   . Diabetes mellitus   . Cardiomyopathy   . Hyperglycemia   . Bipolar 1 disorder   . Hyperkalemia   . Coronary artery disease   . Hyponatremia   . Altered mental status     with psychosis  . Dehydration   . TIA (transient ischemic attack)   . Lack of coordination   . Cardiomyopathy     Medications:   Assessment: 59 yo male admitted 1/22 from [facility] with acute renal failure on chronic renal disease. Pt now with blood and urine cultures positive for Gram Positive Cocci to be started on broad spectrum antibiotics.  Goal of Therapy:  Eradicate infection  Plan:  Preliminary review of pertinent patient information completed.  Protocol will be initiated with one-time doses of Vancomycin 500 mg IV and Zosyn 3.375 Gm IV.  [name] clinical pharmacist will complete review during morning rounds to assess patient and finalize treatment regimen.  Cornelia Copa,  Bevely Palmer, [facility] 10/04/2014,3:37 AM

## 2014-10-04 NOTE — Plan of Care (Signed)
Problem: Phase I Progression Outcomes Goal: OOB as tolerated unless otherwise ordered Outcome: Not Met (add Reason) PT DOES NOT LIKE TO BE TOUCHED  AND WITH HIS ALTERED MENTAL STAUSY,TREMOTS AND TARDIVE DYSKENESIA  IT IS UNSAFE FOR PT AND STAFF TO TRY TO GET PT OOB  Problem: Phase II Progression Outcomes Goal: Discharge plan established Outcome: Not Progressing AT THIS TIME THERE IS NO PRELIMANARY DISCHARGE PLAN.

## 2014-10-04 NOTE — Progress Notes (Signed)
ANTIBIOTIC CONSULT NOTE - INITIAL  Pharmacy Consult for Vancomycin and Zosyn Indication: bacteremia  No Known Allergies  Patient Measurements: Height: 5\' 9"  (175.3 cm) Weight: 130 lb 1.1 oz (59 kg) IBW/kg (Calculated) : 70.7  Vital Signs: Temp: 102.4 F (39.1 C) (01/31 0757) Temp Source: Axillary (01/31 0757) BP: 154/68 mmHg (01/31 0900) Pulse Rate: 100 (01/31 0900) Intake/Output from previous day: 01/30 0701 - 01/31 0700 In: 2286 [P.O.:180; I.V.:1801; IV Piggyback:305] Out: 600 [Urine:600] Intake/Output from this shift: Total I/O In: 240 [P.O.:90; I.V.:150] Out: 650 [Urine:650]  Labs:  Recent Labs  10/02/14 0744  CREATININE 1.31   Estimated Creatinine Clearance: 51.9 mL/min (by C-G formula based on Cr of 1.31). No results for input(s): VANCOTROUGH, VANCOPEAK, VANCORANDOM, GENTTROUGH, GENTPEAK, GENTRANDOM, TOBRATROUGH, TOBRAPEAK, TOBRARND, AMIKACINPEAK, AMIKACINTROU, AMIKACIN in the last 72 hours.   Microbiology: Recent Results (from the past 720 hour(s))  MRSA PCR Screening     Status: Abnormal   Collection Time: 09/25/14  2:57 PM  Result Value Ref Range Status   MRSA by PCR POSITIVE (A) NEGATIVE Final    Comment:        The GeneXpert MRSA Assay (FDA approved for NASAL specimens only), is one component of a comprehensive MRSA colonization surveillance program. It is not intended to diagnose MRSA infection nor to guide or monitor treatment for MRSA infections. RESULT CALLED TO, READ BACK BY AND VERIFIED WITH: NIELSON,T ON 09/25/14 AT 2105 BY LOY,C   Culture, blood (routine x 2)     Status: None (Preliminary result)   Collection Time: 10/03/14  9:58 AM  Result Value Ref Range Status   Specimen Description RIGHT ANTECUBITAL  Final   Special Requests BOTTLES DRAWN AEROBIC AND ANAEROBIC  Final   Culture   Final    GRAM POSITIVE COCCI IN CLUSTERS Gram Stain Report Called to,Read Back By and Verified With: LEE,B @ 0330 ON 10/04/14 BY WOODIE,J GS DONE @ [facility]    Report Status PENDING  Incomplete  Culture, blood (routine x 2)     Status: None (Preliminary result)   Collection Time: 10/03/14  9:59 AM  Result Value Ref Range Status   Specimen Description BLOOD RIGHT ARM  Final   Special Requests BOTTLES DRAWN AEROBIC AND ANAEROBIC  Final   Culture   Final    GRAM POSITIVE COCCI IN CLUSTERS Gram Stain Report Called to,Read Back By and Verified With: BREWER,D @ 2335 ON 10/03/14 BY WOODIE,J GS DONE @ [facility]    Report Status PENDING  Incomplete    Medical History: Past Medical History  Diagnosis Date  . Aortic dissection   . Hypertension   . CVA (cerebral infarction)   . Aortic dissection   . Hypertension   . CVA (cerebral infarction)   . Diabetes mellitus   . Cardiomyopathy   . Hyperglycemia   . Bipolar 1 disorder   . Hyperkalemia   . Coronary artery disease   . Hyponatremia   . Altered mental status     with psychosis  . Dehydration   . TIA (transient ischemic attack)   . Lack of coordination   . Cardiomyopathy    Anti-infectives    Start     Dose/Rate Route Frequency Ordered Stop   10/04/14 1800  vancomycin (VANCOCIN) IVPB 750 mg/150 ml premix     750 mg150 mL/hr over 60 Minutes Intravenous Every 12 hours 10/04/14 1005     10/04/14 1300  piperacillin-tazobactam (ZOSYN) IVPB 3.375 g     3.375 g12.5 mL/hr over 240  Minutes Intravenous Every 8 hours 10/04/14 1003     10/04/14 1100  vancomycin (VANCOCIN) 500 mg in sodium chloride 0.9 % 100 mL IVPB     500 mg100 mL/hr over 60 Minutes Intravenous NOW 10/04/14 1007 10/05/14 1100   10/04/14 0345  vancomycin (VANCOCIN) 500 mg in sodium chloride 0.9 % 100 mL IVPB     500 mg100 mL/hr over 60 Minutes Intravenous  Once 10/04/14 0335 10/04/14 0527   10/04/14 0345  piperacillin-tazobactam (ZOSYN) IVPB 3.375 g     3.375 g12.5 mL/hr over 240 Minutes Intravenous  Once 10/04/14 9471 10/04/14 0826     Assessment: 59yo male with positive blood cx's.  2/2 cx's with GPC.  Initiating Vancomycin and Zosyn,  initial doses given last night.  Estimated Creatinine Clearance: 51.9 mL/min (by C-G formula based on Cr of 1.31).  Goal of Therapy:  Vancomycin trough level 15-20 mcg/ml Eradicate infection.  Plan:   Vancomycin 750mg  IV q12hrs  Check trough at steady state  Zosyn 3.375gm IV q8h, each dose over 4 hrs  Monitor labs, renal fxn, and cultures  Recommend to consult Infectious Disease for further recommendations  Hart Robinsons A 10/04/2014,10:07 AM

## 2014-10-04 NOTE — Progress Notes (Signed)
Subjective: Patient continue to spike fever. He is growing G+ coci in his blood. Chest x-ray is also showing some congestive changes. He remained confused and disoriented. Objective: Vital signs in last 24 hours: Temp:  [98.7 F (37.1 C)-102.6 F (39.2 C)] 102.4 F (39.1 C) (01/31 0757) Pulse Rate:  [85-114] 103 (01/31 0600) Resp:  [16-40] 25 (01/31 0600) BP: (116-175)/(59-110) 148/69 mmHg (01/31 0600) SpO2:  [93 %-100 %] 97 % (01/31 0600) Weight change:  Last BM Date: 10/03/14  Intake/Output from previous day: 01/30 0701 - 01/31 0700 In: 2161 [P.O.:180; I.V.:1726; IV Piggyback:255] Out: 600 [Urine:600]  PHYSICAL EXAM General appearance: no distress and slowed mentation Resp: diminished breath sounds bilaterally and rhonchi bilaterally Cardio: S1, S2 normal GI: soft, non-tender; bowel sounds normal; no masses,  no organomegaly Extremities: extremities normal, atraumatic, no cyanosis or edema  Lab Results:  Results for orders placed or performed during the hospital encounter of 09/25/14 (from the past 48 hour(s))  Glucose, capillary     Status: None   Collection Time: 10/02/14 12:34 PM  Result Value Ref Range   Glucose-Capillary 93 70 - 99 mg/dL  Glucose, capillary     Status: Abnormal   Collection Time: 10/02/14  4:15 PM  Result Value Ref Range   Glucose-Capillary 130 (H) 70 - 99 mg/dL   Comment 1 Notify RN    Comment 2 Documented in Chart   Glucose, capillary     Status: Abnormal   Collection Time: 10/02/14  8:34 PM  Result Value Ref Range   Glucose-Capillary 150 (H) 70 - 99 mg/dL   Comment 1 Notify RN   Glucose, capillary     Status: Abnormal   Collection Time: 10/03/14  7:51 AM  Result Value Ref Range   Glucose-Capillary 142 (H) 70 - 99 mg/dL   Comment 1 Documented in Chart    Comment 2 Notify RN   Culture, blood (routine x 2)     Status: None (Preliminary result)   Collection Time: 10/03/14  9:58 AM  Result Value Ref Range   Specimen Description RIGHT  ANTECUBITAL    Special Requests BOTTLES DRAWN AEROBIC AND ANAEROBIC    Culture      GRAM POSITIVE COCCI IN CLUSTERS Gram Stain Report Called to,Read Back By and Verified With: LEE,B @ 0330 ON 10/04/14 BY WOODIE,J GS DONE @ [facility]    Report Status PENDING   Culture, blood (routine x 2)     Status: None (Preliminary result)   Collection Time: 10/03/14  9:59 AM  Result Value Ref Range   Specimen Description BLOOD RIGHT ARM    Special Requests BOTTLES DRAWN AEROBIC AND ANAEROBIC    Culture      GRAM POSITIVE COCCI IN CLUSTERS Gram Stain Report Called to,Read Back By and Verified With: BREWER,D @ 2335 ON 10/03/14 BY WOODIE,J GS DONE @ [facility]    Report Status PENDING   Glucose, capillary     Status: Abnormal   Collection Time: 10/03/14 11:34 AM  Result Value Ref Range   Glucose-Capillary 136 (H) 70 - 99 mg/dL  Glucose, capillary     Status: Abnormal   Collection Time: 10/03/14  4:48 PM  Result Value Ref Range   Glucose-Capillary 132 (H) 70 - 99 mg/dL   Comment 1 Documented in Chart    Comment 2 Notify RN   Glucose, capillary     Status: Abnormal   Collection Time: 10/03/14  7:53 PM  Result Value Ref Range   Glucose-Capillary 124 (H) 70 -  99 mg/dL  Glucose, capillary     Status: Abnormal   Collection Time: 10/04/14 12:37 AM  Result Value Ref Range   Glucose-Capillary 162 (H) 70 - 99 mg/dL  Glucose, capillary     Status: None   Collection Time: 10/04/14  4:14 AM  Result Value Ref Range   Glucose-Capillary 85 70 - 99 mg/dL  Glucose, capillary     Status: Abnormal   Collection Time: 10/04/14  7:55 AM  Result Value Ref Range   Glucose-Capillary 142 (H) 70 - 99 mg/dL   Comment 1 Documented in Chart    Comment 2 Notify RN     ABGS No results for input(s): PHART, PO2ART, TCO2, HCO3 in the last 72 hours.  Invalid input(s): PCO2 CULTURES Recent Results (from the past 240 hour(s))  MRSA PCR Screening     Status: Abnormal   Collection Time: 09/25/14  2:57 PM  Result Value Ref Range  Status   MRSA by PCR POSITIVE (A) NEGATIVE Final    Comment:        The GeneXpert MRSA Assay (FDA approved for NASAL specimens only), is one component of a comprehensive MRSA colonization surveillance program. It is not intended to diagnose MRSA infection nor to guide or monitor treatment for MRSA infections. RESULT CALLED TO, READ BACK BY AND VERIFIED WITH: NIELSON,T ON 09/25/14 AT 2105 BY LOY,C   Culture, blood (routine x 2)     Status: None (Preliminary result)   Collection Time: 10/03/14  9:58 AM  Result Value Ref Range Status   Specimen Description RIGHT ANTECUBITAL  Final   Special Requests BOTTLES DRAWN AEROBIC AND ANAEROBIC  Final   Culture   Final    GRAM POSITIVE COCCI IN CLUSTERS Gram Stain Report Called to,Read Back By and Verified With: LEE,B @ 0330 ON 10/04/14 BY WOODIE,J GS DONE @ [facility]    Report Status PENDING  Incomplete  Culture, blood (routine x 2)     Status: None (Preliminary result)   Collection Time: 10/03/14  9:59 AM  Result Value Ref Range Status   Specimen Description BLOOD RIGHT ARM  Final   Special Requests BOTTLES DRAWN AEROBIC AND ANAEROBIC  Final   Culture   Final    GRAM POSITIVE COCCI IN CLUSTERS Gram Stain Report Called to,Read Back By and Verified With: BREWER,D @ 2335 ON 10/03/14 BY WOODIE,J GS DONE @ [facility]    Report Status PENDING  Incomplete   Studies/Results: Dg Chest 1 View  10/03/2014   CLINICAL DATA:  Acute onset of fever.  Initial encounter.  EXAM: CHEST - 1 VIEW  COMPARISON:  Chest radiograph performed 07/13/2014  FINDINGS: Vascular congestion is noted, with mildly increased interstitial markings. This raises question for mild interstitial edema, though given the patient's symptoms, mild pneumonia could conceivably have a similar appearance. No pleural effusion or pneumothorax is seen.  The cardiomediastinal silhouette is borderline normal in size. The patient is status post median sternotomy. No acute osseous abnormalities are identified.   IMPRESSION: Vascular congestion, with mildly increased interstitial markings. This raises question for mild interstitial edema, though given the patient's symptoms, mild pneumonia could conceivably have a similar appearance.   Electronically Signed   By: Garald Balding M.D.   On: 10/03/2014 10:21    Medications: I have reviewed the patient's current medications.  Assesment:   Principal Problem:   Hyperkalemia Active Problems:   Bipolar I disorder, most recent episode mixed   H/O: stroke   Dehydration   CVA, old, cognitive  deficits   CKD (chronic kidney disease) stage 3, GFR 30-59 ml/min   Thrombocytopenia   Acute encephalopathy   Acute renal failure   Hypoglycemia fever G+ bacteremia   Plan:  Medications reviewed Continue IV fluid Blood culture and chest x-ray result  Reviewed Will continue combination IV antibiotics     LOS: 9 days   Soha Thorup 10/04/2014, 9:17 AM

## 2014-10-04 NOTE — Progress Notes (Signed)
PT SHOWS NO IMPROVEMENT IN COGNITIVE OR PHYSICAL CONDITION. 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 HE WILL SPEAK CLEAR AT TIMES, AND TRY TO TALK W/ HIS MOUTH CLOSED AT  OTHER TIMES. THOUGH OUT THE DAY PT WILL TURN HIMSELF IN A LARGE CIRCLE IN HIS BED.REFUSES AND FIGHTS WHEN MOUTH CARE ATTEMPTED. TEMP HAS BEEN ELEVATED X2 DAY DESPITE TYLENOL AND START OF ANTIBIOTICS

## 2014-10-04 NOTE — Progress Notes (Signed)
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  On no ABX  eICU Interventions  Plan: Pharmacy to dose Vanc/Zosyn Nurse to contact and inform primary MD     Intervention Category Major Interventions: Sepsis - evaluation and management  Marion Seese 10/04/2014, 3:07 AM

## 2014-10-05 ENCOUNTER — Inpatient Hospital Stay (HOSPITAL_COMMUNITY): Payer: Medicare Other

## 2014-10-05 LAB — GLUCOSE, CAPILLARY
GLUCOSE-CAPILLARY: 150 mg/dL — AB (ref 70–99)
GLUCOSE-CAPILLARY: 120 mg/dL — AB (ref 70–99)
GLUCOSE-CAPILLARY: 139 mg/dL — AB (ref 70–99)
Glucose-Capillary: 113 mg/dL — ABNORMAL HIGH (ref 70–99)
Glucose-Capillary: 149 mg/dL — ABNORMAL HIGH (ref 70–99)
Glucose-Capillary: 163 mg/dL — ABNORMAL HIGH (ref 70–99)

## 2014-10-05 LAB — BASIC METABOLIC PANEL
Anion gap: 6 (ref 5–15)
BUN: 36 mg/dL — AB (ref 6–23)
CO2: 28 mmol/L (ref 19–32)
CREATININE: 1.6 mg/dL — AB (ref 0.50–1.35)
Calcium: 7.7 mg/dL — ABNORMAL LOW (ref 8.4–10.5)
Chloride: 106 mmol/L (ref 96–112)
GFR calc Af Amer: 54 mL/min — ABNORMAL LOW (ref >90)
GFR, EST NON AFRICAN AMERICAN: 46 mL/min — AB (ref >90)
Glucose, Bld: 163 mg/dL — ABNORMAL HIGH (ref 70–99)
Potassium: 3.2 mmol/L — ABNORMAL LOW (ref 3.5–5.1)
Sodium: 140 mmol/L (ref 135–145)

## 2014-10-05 LAB — VALPROIC ACID LEVEL: Valproic Acid Lvl: 111.5 ug/mL — ABNORMAL HIGH (ref 50.0–100.0)

## 2014-10-05 MED ORDER — CEFAZOLIN SODIUM-DEXTROSE 2-3 GM-% IV SOLR
2.0000 g | Freq: Three times a day (TID) | INTRAVENOUS | Status: DC
  Administered 2014-10-05 – 2014-10-06 (×3): 2 g via INTRAVENOUS
  Filled 2014-10-05 (×4): qty 50

## 2014-10-05 MED ORDER — INSULIN ASPART 100 UNIT/ML ~~LOC~~ SOLN
0.0000 [IU] | Freq: Three times a day (TID) | SUBCUTANEOUS | Status: DC
  Administered 2014-10-06: 3 [IU] via SUBCUTANEOUS
  Administered 2014-10-06: 2 [IU] via SUBCUTANEOUS
  Administered 2014-10-06: 3 [IU] via SUBCUTANEOUS
  Administered 2014-10-07 (×2): 2 [IU] via SUBCUTANEOUS
  Administered 2014-10-07: 1 [IU] via SUBCUTANEOUS
  Administered 2014-10-08 (×3): 2 [IU] via SUBCUTANEOUS
  Administered 2014-10-10 – 2014-10-11 (×4): 1 [IU] via SUBCUTANEOUS

## 2014-10-05 MED ORDER — VANCOMYCIN HCL IN DEXTROSE 1-5 GM/200ML-% IV SOLN
1000.0000 mg | INTRAVENOUS | Status: DC
  Administered 2014-10-06 – 2014-10-08 (×3): 1000 mg via INTRAVENOUS
  Filled 2014-10-05 (×4): qty 200

## 2014-10-05 NOTE — Progress Notes (Signed)
ANTIBIOTIC CONSULT NOTE -   Pharmacy Consult for Vancomycin and Zosyn Indication: bacteremia RECOMMEND ID CONSULT  No Known Allergies  Patient Measurements: Height: 5\' 9"  (175.3 cm) Weight: 130 lb 1.1 oz (59 kg) IBW/kg (Calculated) : 70.7  Vital Signs: Temp: 100.1 F (37.8 C) (02/01 0800) Temp Source: Axillary (02/01 0800) BP: 119/56 mmHg (02/01 0900) Pulse Rate: 91 (02/01 0900) Intake/Output from previous day: 01/31 0701 - 02/01 0700 In: 2397.5 [P.O.:90; I.V.:1692.5; IV Piggyback:615] Out: 1525 [STMHD:6222] Intake/Output from this shift:    Labs:  Recent Labs  10/05/14 0511  CREATININE 1.60*   Estimated Creatinine Clearance: 42.5 mL/min (by C-G formula based on Cr of 1.6). No results for input(s): VANCOTROUGH, VANCOPEAK, VANCORANDOM, GENTTROUGH, GENTPEAK, GENTRANDOM, TOBRATROUGH, TOBRAPEAK, TOBRARND, AMIKACINPEAK, AMIKACINTROU, AMIKACIN in the last 72 hours.   Microbiology: Recent Results (from the past 720 hour(s))  MRSA PCR Screening     Status: Abnormal   Collection Time: 09/25/14  2:57 PM  Result Value Ref Range Status   MRSA by PCR POSITIVE (A) NEGATIVE Final    Comment:        The GeneXpert MRSA Assay (FDA approved for NASAL specimens only), is one component of a comprehensive MRSA colonization surveillance program. It is not intended to diagnose MRSA infection nor to guide or monitor treatment for MRSA infections. RESULT CALLED TO, READ BACK BY AND VERIFIED WITH: NIELSON,T ON 09/25/14 AT 2105 BY LOY,C   Culture, blood (routine x 2)     Status: None (Preliminary result)   Collection Time: 10/03/14  9:58 AM  Result Value Ref Range Status   Specimen Description RIGHT ANTECUBITAL  Final   Special Requests BOTTLES DRAWN AEROBIC AND ANAEROBIC  Final   Culture   Final    GRAM POSITIVE COCCI IN CLUSTERS Note: Gram Stain Report Called to,Read Back By and Verified With: LEE B @0330  ON 10/04/14 BY Jeanmarie Plant J Performed at [facility]    Report Status  PENDING  Incomplete  Culture, blood (routine x 2)     Status: None (Preliminary result)   Collection Time: 10/03/14  9:59 AM  Result Value Ref Range Status   Specimen Description BLOOD RIGHT ARM  Final   Special Requests BOTTLES DRAWN AEROBIC AND ANAEROBIC  Final   Culture   Final    GRAM POSITIVE COCCI IN CLUSTERS Note: Gram Stain Report Called to,Read Back By and Verified With: LEE B @ 0330 ON 10/04/14 BY WOODIEB Performed at [facility]    Report Status PENDING  Incomplete  Culture, Urine     Status: None   Collection Time: 10/03/14 10:00 AM  Result Value Ref Range Status   Specimen Description URINE, CATHETERIZED  Final   Special Requests NONE  Final   Colony Count NO GROWTH Performed at [facility]   Final   Culture NO GROWTH Performed at [facility]   Final   Report Status 10/04/2014 FINAL  Final   Medical History: Past Medical History  Diagnosis Date  . Aortic dissection   . Hypertension   . CVA (cerebral infarction)   . Aortic dissection   . Hypertension   . CVA (cerebral infarction)   . Diabetes mellitus   . Cardiomyopathy   . Hyperglycemia   . Bipolar 1 disorder   . Hyperkalemia   . Coronary artery disease   . Hyponatremia   . Altered mental status     with psychosis  . Dehydration   . TIA (transient ischemic attack)   .  Lack of coordination   . Cardiomyopathy    Anti-infectives    Start     Dose/Rate Route Frequency Ordered Stop   10/06/14 0900  vancomycin (VANCOCIN) IVPB 1000 mg/200 mL premix     1,000 mg200 mL/hr over 60 Minutes Intravenous Every 24 hours 10/05/14 1105     10/04/14 1800  vancomycin (VANCOCIN) IVPB 750 mg/150 ml premix  Status:  Discontinued     750 mg150 mL/hr over 60 Minutes Intravenous Every 12 hours 10/04/14 1005 10/05/14 1104   10/04/14 1300  piperacillin-tazobactam (ZOSYN) IVPB 3.375 g     3.375 g12.5 mL/hr over 240 Minutes Intravenous Every 8 hours 10/04/14 1003     10/04/14 1100  vancomycin  (VANCOCIN) 500 mg in sodium chloride 0.9 % 100 mL IVPB     500 mg100 mL/hr over 60 Minutes Intravenous NOW 10/04/14 1007 10/04/14 1201   10/04/14 0345  vancomycin (VANCOCIN) 500 mg in sodium chloride 0.9 % 100 mL IVPB     500 mg100 mL/hr over 60 Minutes Intravenous  Once 10/04/14 0335 10/04/14 0527   10/04/14 0345  piperacillin-tazobactam (ZOSYN) IVPB 3.375 g     3.375 g12.5 mL/hr over 240 Minutes Intravenous  Once 10/04/14 0337 10/04/14 0826     Assessment: 59yo male with positive blood cx's.  2/2 cx's with GPC.  Initiating Vancomycin and Zosyn, initial doses given last night.  Still spiking fevers.  SCr rising 1.3 >> 1.6 after Vancomycin started.   Estimated Creatinine Clearance: 42.5 mL/min (by C-G formula based on Cr of 1.6).  Goal of Therapy:  Vancomycin trough level 15-20 mcg/ml Eradicate infection.  Plan:   Empirically decrease Vancomycin to 1000mg  IV q24hrs  F/U SCr tomorrow  Check trough at steady state  Zosyn 3.375gm IV q8h, each dose over 4 hrs (for clcr > 20)  Monitor labs, renal fxn, and cultures  Recommend to consult Infectious Disease for further recommendations  Hart Robinsons A 10/05/2014,11:12 AM

## 2014-10-05 NOTE — Progress Notes (Signed)
IV in L Wrist assessed. Site has purulent drainage and redness in duration. Infiltration noted in LUA. Dr. Legrand Rams will be notified.

## 2014-10-05 NOTE — Clinical Social Work Note (Signed)
CSW spoke with Jackelyn Poling at [facility] and advised that patient remained in the unit. Debbie advised that the facility should have a bed available when patient is discharged.    Ambrose Pancoast, [place]

## 2014-10-05 NOTE — Progress Notes (Signed)
Subjective: Patient is sedated. Continue to spike fever intermittently.  Objective: Vital signs in last 24 hours: Temp:  [98 F (36.7 C)-102.8 F (39.3 C)] 98.4 F (36.9 C) (02/01 0400) Pulse Rate:  [79-106] 90 (02/01 0500) Resp:  [16-33] 16 (02/01 0500) BP: (99-154)/(48-107) 140/57 mmHg (02/01 0500) SpO2:  [93 %-100 %] 95 % (02/01 0500) Weight change:  Last BM Date: 10/04/14  Intake/Output from previous day: 01/31 0701 - 02/01 0700 In: 2397.5 [P.O.:90; I.V.:1692.5; IV Piggyback:615] Out: 1525 [Urine:1525]  PHYSICAL EXAM General appearance: no distress and slowed mentation Resp: diminished breath sounds bilaterally and rhonchi bilaterally Cardio: S1, S2 normal GI: soft, non-tender; bowel sounds normal; no masses,  no organomegaly Extremities: extremities normal, atraumatic, no cyanosis or edema  Lab Results:  Results for orders placed or performed during the hospital encounter of 09/25/14 (from the past 48 hour(s))  Culture, blood (routine x 2)     Status: None (Preliminary result)   Collection Time: 10/03/14  9:58 AM  Result Value Ref Range   Specimen Description RIGHT ANTECUBITAL    Special Requests BOTTLES DRAWN AEROBIC AND ANAEROBIC    Culture      GRAM POSITIVE COCCI IN CLUSTERS Gram Stain Report Called to,Read Back By and Verified With: LEE,B @ 0330 ON 10/04/14 BY WOODIE,J GS DONE @ [facility]    Report Status PENDING   Culture, blood (routine x 2)     Status: None (Preliminary result)   Collection Time: 10/03/14  9:59 AM  Result Value Ref Range   Specimen Description BLOOD RIGHT ARM    Special Requests BOTTLES DRAWN AEROBIC AND ANAEROBIC    Culture      GRAM POSITIVE COCCI IN CLUSTERS Gram Stain Report Called to,Read Back By and Verified With: BREWER,D @ 2335 ON 10/03/14 BY WOODIE,J GS DONE @ [facility]    Report Status PENDING   Culture, Urine     Status: None   Collection Time: 10/03/14 10:00 AM  Result Value Ref Range   Specimen Description URINE, CATHETERIZED    Special Requests NONE    Colony Count NO GROWTH Performed at [place] Performed at [facility]     Report Status 10/04/2014 FINAL   Glucose, capillary     Status: Abnormal   Collection Time: 10/03/14 11:34 AM  Result Value Ref Range   Glucose-Capillary 136 (H) 70 - 99 mg/dL  Glucose, capillary     Status: Abnormal   Collection Time: 10/03/14  4:48 PM  Result Value Ref Range   Glucose-Capillary 132 (H) 70 - 99 mg/dL   Comment 1 Documented in Chart    Comment 2 Notify RN   Glucose, capillary     Status: Abnormal   Collection Time: 10/03/14  7:53 PM  Result Value Ref Range   Glucose-Capillary 124 (H) 70 - 99 mg/dL  Glucose, capillary     Status: Abnormal   Collection Time: 10/04/14 12:37 AM  Result Value Ref Range   Glucose-Capillary 162 (H) 70 - 99 mg/dL  Glucose, capillary     Status: None   Collection Time: 10/04/14  4:14 AM  Result Value Ref Range   Glucose-Capillary 85 70 - 99 mg/dL  Glucose, capillary     Status: Abnormal   Collection Time: 10/04/14  7:55 AM  Result Value Ref Range   Glucose-Capillary 142 (H) 70 - 99 mg/dL   Comment 1 Documented in Chart    Comment 2 Notify RN   Glucose,  capillary     Status: Abnormal   Collection Time: 10/04/14 11:25 AM  Result Value Ref Range   Glucose-Capillary 132 (H) 70 - 99 mg/dL  Glucose, capillary     Status: Abnormal   Collection Time: 10/04/14  4:39 PM  Result Value Ref Range   Glucose-Capillary 158 (H) 70 - 99 mg/dL  Glucose, capillary     Status: Abnormal   Collection Time: 10/04/14  7:52 PM  Result Value Ref Range   Glucose-Capillary 156 (H) 70 - 99 mg/dL  Glucose, capillary     Status: Abnormal   Collection Time: 10/05/14 12:09 AM  Result Value Ref Range   Glucose-Capillary 113 (H) 70 - 99 mg/dL  Glucose, capillary     Status: Abnormal   Collection Time: 10/05/14  4:14 AM  Result Value Ref Range   Glucose-Capillary 150 (H) 70 - 99 mg/dL  Basic metabolic panel      Status: Abnormal   Collection Time: 10/05/14  5:11 AM  Result Value Ref Range   Sodium 140 135 - 145 mmol/L   Potassium 3.2 (L) 3.5 - 5.1 mmol/L   Chloride 106 96 - 112 mmol/L   CO2 28 19 - 32 mmol/L   Glucose, Bld 163 (H) 70 - 99 mg/dL   BUN 36 (H) 6 - 23 mg/dL   Creatinine, Ser 1.60 (H) 0.50 - 1.35 mg/dL   Calcium 7.7 (L) 8.4 - 10.5 mg/dL   GFR calc non Af Amer 46 (L) >90 mL/min   GFR calc Af Amer 54 (L) >90 mL/min    Comment: (NOTE) The eGFR has been calculated using the CKD EPI equation. This calculation has not been validated in all clinical situations. eGFR's persistently <90 mL/min signify possible Chronic Kidney Disease.    Anion gap 6 5 - 15  Glucose, capillary     Status: Abnormal   Collection Time: 10/05/14  7:56 AM  Result Value Ref Range   Glucose-Capillary 120 (H) 70 - 99 mg/dL   Comment 1 Notify RN     ABGS No results for input(s): PHART, PO2ART, TCO2, HCO3 in the last 72 hours.  Invalid input(s): PCO2 CULTURES Recent Results (from the past 240 hour(s))  MRSA PCR Screening     Status: Abnormal   Collection Time: 09/25/14  2:57 PM  Result Value Ref Range Status   MRSA by PCR POSITIVE (A) NEGATIVE Final    Comment:        The GeneXpert MRSA Assay (FDA approved for NASAL specimens only), is one component of a comprehensive MRSA colonization surveillance program. It is not intended to diagnose MRSA infection nor to guide or monitor treatment for MRSA infections. RESULT CALLED TO, READ BACK BY AND VERIFIED WITH: NIELSON,T ON 09/25/14 AT 2105 BY LOY,C   Culture, blood (routine x 2)     Status: None (Preliminary result)   Collection Time: 10/03/14  9:58 AM  Result Value Ref Range Status   Specimen Description RIGHT ANTECUBITAL  Final   Special Requests BOTTLES DRAWN AEROBIC AND ANAEROBIC  Final   Culture   Final    GRAM POSITIVE COCCI IN CLUSTERS Gram Stain Report Called to,Read Back By and Verified With: LEE,B @ 3212 ON 10/04/14 BY WOODIE,J GS DONE @  [facility]    Report Status PENDING  Incomplete  Culture, blood (routine x 2)     Status: None (Preliminary result)   Collection Time: 10/03/14  9:59 AM  Result Value Ref Range Status   Specimen Description BLOOD RIGHT  ARM  Final   Special Requests BOTTLES DRAWN AEROBIC AND ANAEROBIC  Final   Culture   Final    GRAM POSITIVE COCCI IN CLUSTERS Gram Stain Report Called to,Read Back By and Verified With: BREWER,D @ 2335 ON 10/03/14 BY WOODIE,J GS DONE @ [facility]    Report Status PENDING  Incomplete  Culture, Urine     Status: None   Collection Time: 10/03/14 10:00 AM  Result Value Ref Range Status   Specimen Description URINE, CATHETERIZED  Final   Special Requests NONE  Final   Colony Count NO GROWTH Performed at [facility]   Final   Culture NO GROWTH Performed at [facility]   Final   Report Status 10/04/2014 FINAL  Final   Studies/Results: Dg Chest 1 View  10/03/2014   CLINICAL DATA:  Acute onset of fever.  Initial encounter.  EXAM: CHEST - 1 VIEW  COMPARISON:  Chest radiograph performed 07/13/2014  FINDINGS: Vascular congestion is noted, with mildly increased interstitial markings. This raises question for mild interstitial edema, though given the patient's symptoms, mild pneumonia could conceivably have a similar appearance. No pleural effusion or pneumothorax is seen.  The cardiomediastinal silhouette is borderline normal in size. The patient is status post median sternotomy. No acute osseous abnormalities are identified.  IMPRESSION: Vascular congestion, with mildly increased interstitial markings. This raises question for mild interstitial edema, though given the patient's symptoms, mild pneumonia could conceivably have a similar appearance.   Electronically Signed   By: Garald Balding M.D.   On: 10/03/2014 10:21    Medications: I have reviewed the patient's current medications.  Assesment:   Principal Problem:   Hyperkalemia Active Problems:   Bipolar I disorder,  most recent episode mixed   H/O: stroke   Dehydration   CVA, old, cognitive deficits   CKD (chronic kidney disease) stage 3, GFR 30-59 ml/min   Thrombocytopenia   Acute encephalopathy   Acute renal failure   Hypoglycemia fever G+ bacteremia   Plan:  Medications reviewed Will increase IV fluid to 125 ml/hr. Blood culture and chest x-ray result  Reviewed Will continue combination IV antibiotics     LOS: 10 days   Mone Commisso 10/05/2014, 8:04 AM

## 2014-10-05 NOTE — Progress Notes (Signed)
Patient ID: Derrick Butler, male   DOB: 02/04/1956, 58 y.o.   MRN: [id number]  [facility]  A. , MD     [email or web address]          Derrick Butler is an 58 y.o. male.   Assessment/Plan:  1. Toxic metabolic encephalopathy most likely due to acute renal failure. Dehydration and medication effect compounded by reduced clearance from acute renal impairment are also contributing factors. Also bacteremia now.   2. Agitated confusion and combativeness. Check depakote level.   3. Baseline history of tremors. No evidence of tardive dyskinesia or parkinsonism at this time.  4. Baseline vascular dementia.  5. 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  6. Fever with Gram positive clusters. Source ??? On Antibx  The patient is unresponsive. Only got one dose of haldol and thorazine last 24 due drowsiness. Also got seroquel; Less combative and less   GENERAL: Patient is quite drowsy and unresponsive at this time. Opens eye with sternal rub. Focus tracks.  He states that he is "doing OK"  HEENT: Supple. Atraumatic normocephalic.   ABDOMEN: soft  EXTREMITIES: No edema. There are bandages/dressing seen involving multiple areas of the legs.   BACK: Normal.  SKIN: Normal by inspection.   MENTAL STATUS: Eyes are closed. Less movements today. He moves around vigorously at times but this seems better. He states that he is okay. He does follows midline commands.   CRANIAL NERVES: Pupils are large 6 mm but reactive. Initially pupils were deviated downwards. After the patient woke up, he has full extraocular movements. Coronary reflexes are intact. The patient seemed to not to respond to direct threat bilaterally suggestive of possible visual field impairment. Facial muscle strength symmetric.  MOTOR: Bulk and tone are good. The patient moves both sides vigorously.  COORDINATION: No tremors, dystonia or dysmetria noted. No rigidity, bradykinesia or  parkinsonism observed.   SENSATION: Normal to deep painful stimuli.      Objective: Vital signs in last 24 hours: Temp:  [98 F (36.7 C)-102.8 F (39.3 C)] 98.4 F (36.9 C) (02/01 0400) Pulse Rate:  [79-106] 90 (02/01 0500) Resp:  [16-33] 16 (02/01 0500) BP: (99-154)/(48-107) 140/57 mmHg (02/01 0500) SpO2:  [93 %-100 %] 95 % (02/01 0500)  Intake/Output from previous day: 01/31 0701 - 02/01 0700 In: 2397.5 [P.O.:90; I.V.:1692.5; IV Piggyback:615] Out: 1525 [Urine:1525] Intake/Output this shift:   Nutritional status: Diet Carb Modified   Lab Results: Results for orders placed or performed during the hospital encounter of 09/25/14 (from the past 48 hour(s))  Culture, blood (routine x 2)     Status: None (Preliminary result)   Collection Time: 10/03/14  9:58 AM  Result Value Ref Range   Specimen Description RIGHT ANTECUBITAL    Special Requests BOTTLES DRAWN AEROBIC AND ANAEROBIC    Culture      GRAM POSITIVE COCCI IN CLUSTERS Gram Stain Report Called to,Read Back By and Verified With: LEE,B @ 0330 ON 10/04/14 BY WOODIE,J GS DONE @ [facility]    Report Status PENDING   Culture, blood (routine x 2)     Status: None (Preliminary result)   Collection Time: 10/03/14  9:59 AM  Result Value Ref Range   Specimen Description BLOOD RIGHT ARM    Special Requests BOTTLES DRAWN AEROBIC AND ANAEROBIC    Culture      GRAM POSITIVE COCCI IN CLUSTERS Gram Stain Report Called to,Read Back By and Verified With: BREWER,D @ 2335 ON 10/03/14 BY WOODIE,J GS DONE @   [facility]    Report Status PENDING   Culture, Urine     Status: None   Collection Time: 10/03/14 10:00 AM  Result Value Ref Range   Specimen Description URINE, CATHETERIZED    Special Requests NONE    Colony Count NO GROWTH Performed at [facility]     Culture NO GROWTH Performed at [facility]     Report Status 10/04/2014 FINAL   Glucose, capillary     Status: Abnormal   Collection Time: 10/03/14 11:34 AM  Result  Value Ref Range   Glucose-Capillary 136 (H) 70 - 99 mg/dL  Glucose, capillary     Status: Abnormal   Collection Time: 10/03/14  4:48 PM  Result Value Ref Range   Glucose-Capillary 132 (H) 70 - 99 mg/dL   Comment 1 Documented in Chart    Comment 2 Notify RN   Glucose, capillary     Status: Abnormal   Collection Time: 10/03/14  7:53 PM  Result Value Ref Range   Glucose-Capillary 124 (H) 70 - 99 mg/dL  Glucose, capillary     Status: Abnormal   Collection Time: 10/04/14 12:37 AM  Result Value Ref Range   Glucose-Capillary 162 (H) 70 - 99 mg/dL  Glucose, capillary     Status: None   Collection Time: 10/04/14  4:14 AM  Result Value Ref Range   Glucose-Capillary 85 70 - 99 mg/dL  Glucose, capillary     Status: Abnormal   Collection Time: 10/04/14  7:55 AM  Result Value Ref Range   Glucose-Capillary 142 (H) 70 - 99 mg/dL   Comment 1 Documented in Chart    Comment 2 Notify RN   Glucose, capillary     Status: Abnormal   Collection Time: 10/04/14 11:25 AM  Result Value Ref Range   Glucose-Capillary 132 (H) 70 - 99 mg/dL  Glucose, capillary     Status: Abnormal   Collection Time: 10/04/14  4:39 PM  Result Value Ref Range   Glucose-Capillary 158 (H) 70 - 99 mg/dL  Glucose, capillary     Status: Abnormal   Collection Time: 10/04/14  7:52 PM  Result Value Ref Range   Glucose-Capillary 156 (H) 70 - 99 mg/dL  Glucose, capillary     Status: Abnormal   Collection Time: 10/05/14 12:09 AM  Result Value Ref Range   Glucose-Capillary 113 (H) 70 - 99 mg/dL  Glucose, capillary     Status: Abnormal   Collection Time: 10/05/14  4:14 AM  Result Value Ref Range   Glucose-Capillary 150 (H) 70 - 99 mg/dL  Basic metabolic panel     Status: Abnormal   Collection Time: 10/05/14  5:11 AM  Result Value Ref Range   Sodium 140 135 - 145 mmol/L   Potassium 3.2 (L) 3.5 - 5.1 mmol/L   Chloride 106 96 - 112 mmol/L   CO2 28 19 - 32 mmol/L   Glucose, Bld 163 (H) 70 - 99 mg/dL   BUN 36 (H) 6 - 23 mg/dL    Creatinine, Ser 1.60 (H) 0.50 - 1.35 mg/dL   Calcium 7.7 (L) 8.4 - 10.5 mg/dL   GFR calc non Af Amer 46 (L) >90 mL/min   GFR calc Af Amer 54 (L) >90 mL/min    Comment: (NOTE) The eGFR has been calculated using the CKD EPI equation. This calculation has not been validated in all clinical situations. eGFR's persistently <90 mL/min signify possible Chronic Kidney Disease.    Anion gap 6 5 - 15  Glucose, capillary     Status: Abnormal   Collection Time: 10/05/14  7:56 AM  Result Value Ref Range   Glucose-Capillary 120 (H) 70 - 99 mg/dL   Comment 1 Notify RN     Lipid Panel No results for input(s): CHOL, TRIG, HDL, CHOLHDL, VLDL, LDLCALC in the last 72 hours.  Studies/Results:   Medications:  Scheduled Meds: . antiseptic oral rinse  7 mL Mouth Rinse BID  . feeding supplement (ENSURE COMPLETE)  237 mL Oral QHS  . haloperidol  5 mg Oral TID  . insulin aspart  0-9 Units Subcutaneous 6 times per day  . piperacillin-tazobactam (ZOSYN)  IV  3.375 g Intravenous Q8H  . QUEtiapine  200 mg Oral BID  . simvastatin  20 mg Oral QHS  . sodium chloride  3 mL Intravenous Q12H  . valproate sodium  500 mg Intravenous Q8H  . vancomycin  750 mg Intravenous Q12H   Continuous Infusions: . dextrose 5 % and 0.45% NaCl 75 mL/hr at 10/05/14 0534   PRN Meds:.acetaminophen **OR** acetaminophen, chlorproMAZINE (THORAZINE) injection, haloperidol lactate, labetalol     LOS: 10 days    A. , M.D.  Diplomate, American Board of Psychiatry and Neurology ( Neurology).    

## 2014-10-05 NOTE — Progress Notes (Signed)
INITIAL NUTRITION ASSESSMENT  DOCUMENTATION CODES Per approved criteria  -Severe malnutrition in the context of chronic illness   INTERVENTION: Recommend place NGT (if pt able in light of his reported agitation and combativeness)   Initiate Vital High Protein @ 20 ml/hr via NGT and increase by 10 ml every 6 hours to initial goal rate of 40  ml/hr.   Tube feeding regimen provides 960 kcal (53% of energy and 94% protein needs), 83 grams of protein, and 802 ml of H2O.    Recommend :Monitor magnesium, potassium, and phosphorus daily for at least 3 days, MD to replete as needed, as pt is at risk for refeeding syndrome given his prolonged hypocaloric intake and hx of severe malnutrition.  Current weight pending to assess for percent change. Nursing aware.  NUTRITION DIAGNOSIS: Inadequate oral intake related to toxic metabolic encephalopathy as evidenced by meal intake 0-10% x 10 days.   Goal: Pt to meet >/= 90% of their estimated nutrition needs    Monitor:  Nutrition support measures, labs and weights, skin assessments  Reason for Assessment: poor po intake, low braden score  59 y.o. male  Admitting Dx: Hyperkalemia  ASSESSMENT: Pt hx significant for CVA,vascular dementia,  severe malnutrition , FTT, altered mental status (with psychosis) and dehydration. He presented from [facility] 10 days ago with chief compliant: combative.  He was dehydrated and hyperkcalemic on admission. He is spiking a fever and has gram + bacteremia.  His po intake has been very poor 0-10% of meals since admission. He is sedated currently. He is able to walk and feed himself at baseline.   Pt is high risk for worsening nutritional status and skin breakdown due to his poor po intake and increased nutrient needs.  Labs: Increased BUN-36, Cr. 1.60   Potassium 3.2 low   Height: Ht Readings from Last 1 Encounters:  09/25/14 5\' 9"  (1.753 m)    Weight: Wt Readings from Last 1 Encounters:  09/28/14 130 lb 1.1  oz (59 kg)  10/05/14-   Ideal Body Weight: 160# (72.7 kg)  % Ideal Body Weight: 81 %  Wt Readings from Last 10 Encounters:  09/28/14 130 lb 1.1 oz (59 kg)  07/14/14 152 lb 8.9 oz (69.2 kg)  09/07/13 125 lb 3.5 oz (56.8 kg)  08/18/13 168 lb (76.204 kg)  06/17/13 168 lb 3.4 oz (76.3 kg)  06/02/13 172 lb (78.019 kg)  02/21/10 179 lb (81.194 kg)    Usual Body Weight: 150-155#  % Usual Body Weight: 85%  BMI:  Body mass index is 19.2 kg/(m^2). normal range  Estimated Nutritional Needs: Kcal: 6440-3474 Protein: 88 gr Fluid: 1800 ml daily  Skin: intact  Diet Order: Diet Carb Modified  EDUCATION NEEDS: -Education not appropriate at this time   Intake/Output Summary (Last 24 hours) at 10/05/14 1041 Last data filed at 10/05/14 0534  Gross per 24 hour  Intake 2082.5 ml  Output    875 ml  Net 1207.5 ml    Last BM: 10/04/14  Labs:   Recent Labs Lab 09/30/14 0453 10/02/14 0744 10/05/14 0511  NA 143 141 140  K 3.9 3.4* 3.2*  CL 110 109 106  CO2 28 28 28   BUN 13 15 36*  CREATININE 1.27 1.31 1.60*  CALCIUM 8.5 8.0* 7.7*  GLUCOSE 92 177* 163*    CBG (last 3)   Recent Labs  10/05/14 0009 10/05/14 0414 10/05/14 0756  GLUCAP 113* 150* 120*    Scheduled Meds: . antiseptic oral rinse  7  mL Mouth Rinse BID  . feeding supplement (ENSURE COMPLETE)  237 mL Oral QHS  . haloperidol  5 mg Oral TID  . insulin aspart  0-9 Units Subcutaneous 6 times per day  . piperacillin-tazobactam (ZOSYN)  IV  3.375 g Intravenous Q8H  . QUEtiapine  200 mg Oral BID  . simvastatin  20 mg Oral QHS  . sodium chloride  3 mL Intravenous Q12H  . valproate sodium  500 mg Intravenous Q8H  . vancomycin  750 mg Intravenous Q12H    Continuous Infusions: . dextrose 5 % and 0.45% NaCl 125 mL/hr at 10/05/14 1020    Past Medical History  Diagnosis Date  . Aortic dissection   . Hypertension   . CVA (cerebral infarction)   . Aortic dissection   . Hypertension   . CVA (cerebral  infarction)   . Diabetes mellitus   . Cardiomyopathy   . Hyperglycemia   . Bipolar 1 disorder   . Hyperkalemia   . Coronary artery disease   . Hyponatremia   . Altered mental status     with psychosis  . Dehydration   . TIA (transient ischemic attack)   . Lack of coordination   . Cardiomyopathy     Past Surgical History  Procedure Laterality Date  . Ascending aortic aneurysm repair      Colman Cater MS,RD,CSG,LDN Office: [phone] Pager: [phone]

## 2014-10-05 NOTE — Consult Note (Signed)
[facility] note/Autoconsultation for Staph aureus bacteremia:  Blood cultures now identified as Staph aureus.  Patient already on vancomycin and cefazolin for positive GPC in blood.  Will await sensitivities.  Continue with cefazolin if MSSA or if MRSA vancomycin, or daptomycin if renal insufficiency remains an issue.   Will need TEE.   Repeat blood cultures to assure clearance Duration depends on TEE and clearance of bacteremia.   PICC when blood cultures negative at 72 hours.   Thanks  Sharlyn Odonnel, [employer]

## 2014-10-05 NOTE — Progress Notes (Signed)
Spoke with Dr. Legrand Rams regarding L wrist site. Orders given for surgery consult to evaluate and obtain an ultrasound to rule out DVT. Dr. Arnoldo Morale notified. Will evaluate in AM. Asked nursing to apply warm compresses and monitor.

## 2014-10-05 NOTE — Progress Notes (Signed)
ANTIBIOTIC CONSULT NOTE -   Pharmacy Consult for Vancomycin and Ancef Indication: bacteremia RECOMMEND ID CONSULT  No Known Allergies  Patient Measurements: Height: 5\' 9"  (175.3 cm) Weight: 130 lb 1.1 oz (59 kg) IBW/kg (Calculated) : 70.7  Vital Signs: Temp: 100.1 F (37.8 C) (02/01 0800) Temp Source: Axillary (02/01 0800) BP: 127/63 mmHg (02/01 1100) Pulse Rate: 92 (02/01 1100) Intake/Output from previous day: 01/31 0701 - 02/01 0700 In: 2397.5 [P.O.:90; I.V.:1692.5; IV Piggyback:615] Out: 1525 [JJOAC:1660] Intake/Output from this shift:    Labs:  Recent Labs  10/05/14 0511  CREATININE 1.60*   Estimated Creatinine Clearance: 42.5 mL/min (by C-G formula based on Cr of 1.6). No results for input(s): VANCOTROUGH, VANCOPEAK, VANCORANDOM, GENTTROUGH, GENTPEAK, GENTRANDOM, TOBRATROUGH, TOBRAPEAK, TOBRARND, AMIKACINPEAK, AMIKACINTROU, AMIKACIN in the last 72 hours.   Microbiology: Recent Results (from the past 720 hour(s))  MRSA PCR Screening     Status: Abnormal   Collection Time: 09/25/14  2:57 PM  Result Value Ref Range Status   MRSA by PCR POSITIVE (A) NEGATIVE Final    Comment:        The GeneXpert MRSA Assay (FDA approved for NASAL specimens only), is one component of a comprehensive MRSA colonization surveillance program. It is not intended to diagnose MRSA infection nor to guide or monitor treatment for MRSA infections. RESULT CALLED TO, READ BACK BY AND VERIFIED WITH: NIELSON,T ON 09/25/14 AT 2105 BY LOY,C   Culture, blood (routine x 2)     Status: None (Preliminary result)   Collection Time: 10/03/14  9:58 AM  Result Value Ref Range Status   Specimen Description RIGHT ANTECUBITAL  Final   Special Requests BOTTLES DRAWN AEROBIC AND ANAEROBIC  Final   Culture   Final    GRAM POSITIVE COCCI IN CLUSTERS Note: Gram Stain Report Called to,Read Back By and Verified With: LEE B @0330  ON 10/04/14 BY Jeanmarie Plant J Performed at [facility]    Report Status  PENDING  Incomplete  Culture, blood (routine x 2)     Status: None (Preliminary result)   Collection Time: 10/03/14  9:59 AM  Result Value Ref Range Status   Specimen Description BLOOD RIGHT ARM  Final   Special Requests BOTTLES DRAWN AEROBIC AND ANAEROBIC  Final   Culture   Final    GRAM POSITIVE COCCI IN CLUSTERS Note: Gram Stain Report Called to,Read Back By and Verified With: LEE B @ 0330 ON 10/04/14 BY WOODIEB Performed at [facility]    Report Status PENDING  Incomplete  Culture, Urine     Status: None   Collection Time: 10/03/14 10:00 AM  Result Value Ref Range Status   Specimen Description URINE, CATHETERIZED  Final   Special Requests NONE  Final   Colony Count NO GROWTH Performed at [facility]   Final   Culture NO GROWTH Performed at [facility]   Final   Report Status 10/04/2014 FINAL  Final   Medical History: Past Medical History  Diagnosis Date  . Aortic dissection   . Hypertension   . CVA (cerebral infarction)   . Aortic dissection   . Hypertension   . CVA (cerebral infarction)   . Diabetes mellitus   . Cardiomyopathy   . Hyperglycemia   . Bipolar 1 disorder   . Hyperkalemia   . Coronary artery disease   . Hyponatremia   . Altered mental status     with psychosis  . Dehydration   . TIA (transient ischemic attack)   .  Lack of coordination   . Cardiomyopathy    Anti-infectives    Start     Dose/Rate Route Frequency Ordered Stop   10/06/14 0900  vancomycin (VANCOCIN) IVPB 1000 mg/200 mL premix     1,000 mg200 mL/hr over 60 Minutes Intravenous Every 24 hours 10/05/14 1105     10/05/14 1500  ceFAZolin (ANCEF) IVPB 2 g/50 mL premix     2 g100 mL/hr over 30 Minutes Intravenous 3 times per day 10/05/14 1413     10/04/14 1800  vancomycin (VANCOCIN) IVPB 750 mg/150 ml premix  Status:  Discontinued     750 mg150 mL/hr over 60 Minutes Intravenous Every 12 hours 10/04/14 1005 10/05/14 1104   10/04/14 1300  piperacillin-tazobactam  (ZOSYN) IVPB 3.375 g  Status:  Discontinued     3.375 g12.5 mL/hr over 240 Minutes Intravenous Every 8 hours 10/04/14 1003 10/05/14 1411   10/04/14 1100  vancomycin (VANCOCIN) 500 mg in sodium chloride 0.9 % 100 mL IVPB     500 mg100 mL/hr over 60 Minutes Intravenous NOW 10/04/14 1007 10/04/14 1201   10/04/14 0345  vancomycin (VANCOCIN) 500 mg in sodium chloride 0.9 % 100 mL IVPB     500 mg100 mL/hr over 60 Minutes Intravenous  Once 10/04/14 0335 10/04/14 0527   10/04/14 0345  piperacillin-tazobactam (ZOSYN) IVPB 3.375 g     3.375 g12.5 mL/hr over 240 Minutes Intravenous  Once 10/04/14 7903 10/04/14 0826     Assessment: 59yo male with positive blood cx's.  2/2 cx's with GPC.  Awaiting final cx results for ID of organism.  Still spiking fevers.  SCr rising 1.3 >> 1.6, will monitor.   Estimated Creatinine Clearance: 42.5 mL/min (by C-G formula based on Cr of 1.6).  Goal of Therapy:  Vancomycin trough level 15-20 mcg/ml Eradicate infection.  Plan:   Empirically decrease Vancomycin to 1000mg  IV q24hrs  F/U SCr tomorrow  Check trough at steady state  Ancef 2gm IV q8hrs  Narrow ABX therapy when organism ID'd  Monitor labs, renal fxn, and f/u blood cultures  Recommend to consult Infectious Disease for further recommendations  Hart Robinsons A 10/05/2014,2:16 PM

## 2014-10-06 DIAGNOSIS — I82C12 Acute embolism and thrombosis of left internal jugular vein: Secondary | ICD-10-CM

## 2014-10-06 LAB — CULTURE, BLOOD (ROUTINE X 2)

## 2014-10-06 LAB — GLUCOSE, CAPILLARY
GLUCOSE-CAPILLARY: 243 mg/dL — AB (ref 70–99)
Glucose-Capillary: 167 mg/dL — ABNORMAL HIGH (ref 70–99)
Glucose-Capillary: 234 mg/dL — ABNORMAL HIGH (ref 70–99)
Glucose-Capillary: 182 mg/dL — ABNORMAL HIGH (ref 70–99)

## 2014-10-06 LAB — CBC
HCT: 31.9 % — ABNORMAL LOW (ref 39.0–52.0)
HEMOGLOBIN: 10.6 g/dL — AB (ref 13.0–17.0)
MCH: 30.1 pg (ref 26.0–34.0)
MCHC: 33.2 g/dL (ref 30.0–36.0)
MCV: 90.6 fL (ref 78.0–100.0)
PLATELETS: 34 10*3/uL — AB (ref 150–400)
RBC: 3.52 MIL/uL — ABNORMAL LOW (ref 4.22–5.81)
RDW: 13.1 % (ref 11.5–15.5)
WBC: 3 10*3/uL — AB (ref 4.0–10.5)

## 2014-10-06 LAB — BASIC METABOLIC PANEL
Anion gap: 5 (ref 5–15)
BUN: 35 mg/dL — AB (ref 6–23)
CHLORIDE: 106 mmol/L (ref 96–112)
CO2: 30 mmol/L (ref 19–32)
CREATININE: 1.44 mg/dL — AB (ref 0.50–1.35)
Calcium: 7.9 mg/dL — ABNORMAL LOW (ref 8.4–10.5)
GFR calc non Af Amer: 53 mL/min — ABNORMAL LOW (ref >90)
GFR, EST AFRICAN AMERICAN: 61 mL/min — AB (ref >90)
GLUCOSE: 175 mg/dL — AB (ref 70–99)
POTASSIUM: 3.3 mmol/L — AB (ref 3.5–5.1)
Sodium: 141 mmol/L (ref 135–145)

## 2014-10-06 MED ORDER — VALPROIC ACID 250 MG PO CAPS
500.0000 mg | ORAL_CAPSULE | Freq: Three times a day (TID) | ORAL | Status: DC
  Administered 2014-10-06 – 2014-10-07 (×4): 500 mg via ORAL
  Filled 2014-10-06 (×9): qty 2

## 2014-10-06 MED ORDER — ENOXAPARIN SODIUM 30 MG/0.3ML ~~LOC~~ SOLN
30.0000 mg | Freq: Two times a day (BID) | SUBCUTANEOUS | Status: DC
  Administered 2014-10-06 – 2014-10-07 (×2): 30 mg via SUBCUTANEOUS
  Filled 2014-10-06 (×2): qty 0.3

## 2014-10-06 NOTE — Progress Notes (Signed)
Patient ID: Derrick Butler, male   DOB: 11/15/1955, 59 y.o.   MRN: [id number]  [place] A. [name], MD     [email or web address]          Derrick Butler is an 59 y.o. male.   Assessment/Plan:  1. Toxic metabolic encephalopathy most likely due to acute renal failure. Dehydration and medication effect compounded by reduced clearance from acute renal impairment are also contributing factors. Also bacteremia now.   2. Agitated confusion and combativeness. Check depakote level. -  This is slightly elevated. We should keep the doses same however.  3. Baseline history of tremors. No evidence of tardive dyskinesia or parkinsonism at this time.  4. Baseline vascular dementia.  5. 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  6. Fever with Gram positive clusters. Source ??? On Antibx    The patient has improved rather remarkably well overnight. The nurses report that he has been able to take his oral medications on yesterday. He also is able to take some foods. [name] who has been followed patient for much of his hospitalization reports that he is the best that he has been since being hospitalized. It appears that the patient has cellulitis of the left upper extremity. I suspect this is most likely cause for the MRSA bacteremia and causing fevers. His fever has improved. Last temperature elevated is 100.1 over 24 hours ago 8 AM yesterday. He has defervesced since then on antibiotics.    GENERAL: Improved.   HEENT: Supple. Atraumatic normocephalic.   ABDOMEN: soft  EXTREMITIES: No edema. There are bandages/dressing seen involving multiple areas of the legs.   BACK: Normal.  SKIN: Normal by inspection.   MENTAL STATUS: The patient lays in bed with eyes closed. He wants his eyes to light sternal rub. He speaks in simple sentences and states that "he is doing okay man". He tells her that he does not want any more IV/injections. He follows  midline commands and is occasionally appendicular commands. He continues to have some dyskinetic type movements of the head and extremities although these are much improved.  CRANIAL NERVES: Pupils are large 6 mm but reactive. Initially pupils were deviated downwards. After the patient woke up, he has full extraocular movements. Coronary reflexes are intact. The patient seemed to not to respond to direct threat bilaterally suggestive of possible visual field impairment. Facial muscle strength symmetric.  MOTOR: Bulk and tone are good. The patient moves both sides vigorously.  COORDINATION: No tremors, dystonia or dysmetria noted. No rigidity, bradykinesia or parkinsonism observed.   SENSATION: Normal to deep painful stimuli.      Objective: Vital signs in last 24 hours: Temp:  [97.4 F (36.3 C)-98.5 F (36.9 C)] 97.7 F (36.5 C) (02/02 0400) Pulse Rate:  [74-96] 74 (02/02 0500) Resp:  [15-27] 19 (02/02 0500) BP: (104-150)/(32-77) 121/56 mmHg (02/02 0500) SpO2:  [94 %-100 %] 95 % (02/02 0500)  Intake/Output from previous day: 02/01 0701 - 02/02 0700 In: 1770.8 [P.O.:120; I.V.:1440.8; IV Piggyback:210] Out: 9417 [Urine:1125] Intake/Output this shift:   Nutritional status: Diet Carb Modified   Lab Results: Results for orders placed or performed during the hospital encounter of 09/25/14 (from the past 48 hour(s))  Glucose, capillary     Status: Abnormal   Collection Time: 10/04/14 11:25 AM  Result Value Ref Range   Glucose-Capillary 132 (H) 70 - 99 mg/dL  Glucose, capillary     Status: Abnormal   Collection Time: 10/04/14  4:39 PM  Result Value Ref Range   Glucose-Capillary 158 (H) 70 - 99 mg/dL  Glucose, capillary     Status: Abnormal   Collection Time: 10/04/14  7:52 PM  Result Value Ref Range   Glucose-Capillary 156 (H) 70 - 99 mg/dL  Glucose, capillary     Status: Abnormal   Collection Time: 10/05/14 12:09 AM  Result Value Ref Range   Glucose-Capillary 113 (H) 70 -  99 mg/dL  Glucose, capillary     Status: Abnormal   Collection Time: 10/05/14  4:14 AM  Result Value Ref Range   Glucose-Capillary 150 (H) 70 - 99 mg/dL  Basic metabolic panel     Status: Abnormal   Collection Time: 10/05/14  5:11 AM  Result Value Ref Range   Sodium 140 135 - 145 mmol/L   Potassium 3.2 (L) 3.5 - 5.1 mmol/L   Chloride 106 96 - 112 mmol/L   CO2 28 19 - 32 mmol/L   Glucose, Bld 163 (H) 70 - 99 mg/dL   BUN 36 (H) 6 - 23 mg/dL   Creatinine, Ser 1.60 (H) 0.50 - 1.35 mg/dL   Calcium 7.7 (L) 8.4 - 10.5 mg/dL   GFR calc non Af Amer 46 (L) >90 mL/min   GFR calc Af Amer 54 (L) >90 mL/min    Comment: (NOTE) The eGFR has been calculated using the CKD EPI equation. This calculation has not been validated in all clinical situations. eGFR's persistently <90 mL/min signify possible Chronic Kidney Disease.    Anion gap 6 5 - 15  Glucose, capillary     Status: Abnormal   Collection Time: 10/05/14  7:56 AM  Result Value Ref Range   Glucose-Capillary 120 (H) 70 - 99 mg/dL   Comment 1 Notify RN   Valproic acid level     Status: Abnormal   Collection Time: 10/05/14 11:30 AM  Result Value Ref Range   Valproic Acid Lvl 111.5 (H) 50.0 - 100.0 ug/mL  Glucose, capillary     Status: Abnormal   Collection Time: 10/05/14 12:01 PM  Result Value Ref Range   Glucose-Capillary 149 (H) 70 - 99 mg/dL   Comment 1 Notify RN   Glucose, capillary     Status: Abnormal   Collection Time: 10/05/14  4:04 PM  Result Value Ref Range   Glucose-Capillary 139 (H) 70 - 99 mg/dL   Comment 1 Notify RN   Glucose, capillary     Status: Abnormal   Collection Time: 10/05/14  9:08 PM  Result Value Ref Range   Glucose-Capillary 163 (H) 70 - 99 mg/dL   Comment 1 Notify RN   Basic metabolic panel     Status: Abnormal   Collection Time: 10/06/14  6:15 AM  Result Value Ref Range   Sodium 141 135 - 145 mmol/L   Potassium 3.3 (L) 3.5 - 5.1 mmol/L   Chloride 106 96 - 112 mmol/L   CO2 30 19 - 32 mmol/L    Glucose, Bld 175 (H) 70 - 99 mg/dL   BUN 35 (H) 6 - 23 mg/dL   Creatinine, Ser 1.44 (H) 0.50 - 1.35 mg/dL   Calcium 7.9 (L) 8.4 - 10.5 mg/dL   GFR calc non Af Amer 53 (L) >90 mL/min   GFR calc Af Amer 61 (L) >90 mL/min    Comment: (NOTE) The eGFR has been calculated using the CKD EPI equation. This calculation has not been validated in all clinical situations. eGFR's persistently <90 mL/min signify possible Chronic Kidney Disease.  Anion gap 5 5 - 15  CBC     Status: Abnormal   Collection Time: 10/06/14  6:15 AM  Result Value Ref Range   WBC 3.0 (L) 4.0 - 10.5 K/uL   RBC 3.52 (L) 4.22 - 5.81 MIL/uL   Hemoglobin 10.6 (L) 13.0 - 17.0 g/dL   HCT 31.9 (L) 39.0 - 52.0 %   MCV 90.6 78.0 - 100.0 fL   MCH 30.1 26.0 - 34.0 pg   MCHC 33.2 30.0 - 36.0 g/dL   RDW 13.1 11.5 - 15.5 %   Platelets 34 (L) 150 - 400 K/uL    Comment: SPECIMEN CHECKED FOR CLOTS PLATELET COUNT CONFIRMED BY SMEAR   Glucose, capillary     Status: Abnormal   Collection Time: 10/06/14  7:53 AM  Result Value Ref Range   Glucose-Capillary 167 (H) 70 - 99 mg/dL    Lipid Panel No results for input(s): CHOL, TRIG, HDL, CHOLHDL, VLDL, LDLCALC in the last 72 hours.  Studies/Results:   Medications:  Scheduled Meds: . antiseptic oral rinse  7 mL Mouth Rinse BID  . feeding supplement (ENSURE COMPLETE)  237 mL Oral QHS  . haloperidol  5 mg Oral TID  . insulin aspart  0-9 Units Subcutaneous TID WC & HS  . QUEtiapine  200 mg Oral BID  . simvastatin  20 mg Oral QHS  . sodium chloride  3 mL Intravenous Q12H  . valproate sodium  500 mg Intravenous Q8H  . vancomycin  1,000 mg Intravenous Q24H   Continuous Infusions: . dextrose 5 % and 0.45% NaCl 125 mL/hr at 10/05/14 1403   PRN Meds:.acetaminophen **OR** acetaminophen, chlorproMAZINE (THORAZINE) injection, haloperidol lactate, labetalol     LOS: 11 days   Brandan Glauber A. [name], M.D.  Diplomate, Tax adviser of Psychiatry and Neurology ( Neurology).

## 2014-10-06 NOTE — Progress Notes (Signed)
Called by Dr Legrand Rams re: anticoagulation for nonocclusive thrombus in left jugular vein.  Platelets = 34K. Hematology consult pending, however discussed treatment briefly with Dr Whitney Muse & Kirby Crigler.   Per discussion, anticoagulation likely not needed for superficial thrombus.  Will defer treatment plan to hematology.   Netta Cedars, PharmD, BCPS 10/06/2014@4 :46 PM

## 2014-10-06 NOTE — Progress Notes (Signed)
The patient is receiving Depakote by the intravenous route.  Based on criteria approved by the Pharmacy and [facility], the medication is being converted to the equivalent oral dose form.  These criteria include: -No Active GI bleeding -Able to tolerate diet of full liquids (or better) or tube feeding OR able to tolerate other medications by the oral or enteral route  If you have any questions about this conversion, please contact the Pharmacy Department (ext [phone]).  Thank you.  Biagio Borg, [facility] 10/06/2014 11:21 AM

## 2014-10-06 NOTE — Progress Notes (Signed)
PT ATE WELL AT BREAKFAST. POORLY AT LUNCH. HE IS MUCH LESS AGITATED TODAY THAN HE WAS OVER THE WEEKEND.

## 2014-10-06 NOTE — Consult Note (Signed)
Reason for Consult:Cellulitis, left wrist Referring Physician: Dr. Hinton Butler is an 59 y.o. male.  HPI: Patient is a 58wm with multiple medical problems who was found to have cellulitis in the left wrist at an iv site.  IV removed.  Was noted to have left arm swelling.  U/S shows thrombosis of the superficial venous system as well as partial thrombosis of left internal jugular vein. Patient has been started on anticoagulation.  Past Medical History  Diagnosis Date  . Aortic dissection   . Hypertension   . CVA (cerebral infarction)   . Aortic dissection   . Hypertension   . CVA (cerebral infarction)   . Diabetes mellitus   . Cardiomyopathy   . Hyperglycemia   . Bipolar 1 disorder   . Hyperkalemia   . Coronary artery disease   . Hyponatremia   . Altered mental status     with psychosis  . Dehydration   . TIA (transient ischemic attack)   . Lack of coordination   . Cardiomyopathy     Past Surgical History  Procedure Laterality Date  . Ascending aortic aneurysm repair      Family History  Problem Relation Age of Onset  . Alzheimer's disease Mother   . Stroke Father   . Heart disease Father   . Bipolar disorder Father   . Bipolar disorder Sister   . Bipolar disorder Sister     Social History:  reports that he has never smoked. He does not have any smokeless tobacco history on file. He reports that he does not drink alcohol or use illicit drugs.  Allergies: No Known Allergies  Medications: I have reviewed the patient's current medications.  Results for orders placed or performed during the hospital encounter of 09/25/14 (from the past 48 hour(s))  Glucose, capillary     Status: Abnormal   Collection Time: 10/04/14  4:39 PM  Result Value Ref Range   Glucose-Capillary 158 (H) 70 - 99 mg/dL  Glucose, capillary     Status: Abnormal   Collection Time: 10/04/14  7:52 PM  Result Value Ref Range   Glucose-Capillary 156 (H) 70 - 99 mg/dL  Glucose, capillary      Status: Abnormal   Collection Time: 10/05/14 12:09 AM  Result Value Ref Range   Glucose-Capillary 113 (H) 70 - 99 mg/dL  Glucose, capillary     Status: Abnormal   Collection Time: 10/05/14  4:14 AM  Result Value Ref Range   Glucose-Capillary 150 (H) 70 - 99 mg/dL  Basic metabolic panel     Status: Abnormal   Collection Time: 10/05/14  5:11 AM  Result Value Ref Range   Sodium 140 135 - 145 mmol/L   Potassium 3.2 (L) 3.5 - 5.1 mmol/L   Chloride 106 96 - 112 mmol/L   CO2 28 19 - 32 mmol/L   Glucose, Bld 163 (H) 70 - 99 mg/dL   BUN 36 (H) 6 - 23 mg/dL   Creatinine, Ser 1.60 (H) 0.50 - 1.35 mg/dL   Calcium 7.7 (L) 8.4 - 10.5 mg/dL   GFR calc non Af Amer 46 (L) >90 mL/min   GFR calc Af Amer 54 (L) >90 mL/min    Comment: (NOTE) The eGFR has been calculated using the CKD EPI equation. This calculation has not been validated in all clinical situations. eGFR's persistently <90 mL/min signify possible Chronic Kidney Disease.    Anion gap 6 5 - 15  Glucose, capillary     Status:  Abnormal   Collection Time: 10/05/14  7:56 AM  Result Value Ref Range   Glucose-Capillary 120 (H) 70 - 99 mg/dL   Comment 1 Notify RN   Valproic acid level     Status: Abnormal   Collection Time: 10/05/14 11:30 AM  Result Value Ref Range   Valproic Acid Lvl 111.5 (H) 50.0 - 100.0 ug/mL  Glucose, capillary     Status: Abnormal   Collection Time: 10/05/14 12:01 PM  Result Value Ref Range   Glucose-Capillary 149 (H) 70 - 99 mg/dL   Comment 1 Notify RN   Glucose, capillary     Status: Abnormal   Collection Time: 10/05/14  4:04 PM  Result Value Ref Range   Glucose-Capillary 139 (H) 70 - 99 mg/dL   Comment 1 Notify RN   Glucose, capillary     Status: Abnormal   Collection Time: 10/05/14  9:08 PM  Result Value Ref Range   Glucose-Capillary 163 (H) 70 - 99 mg/dL   Comment 1 Notify RN   Basic metabolic panel     Status: Abnormal   Collection Time: 10/06/14  6:15 AM  Result Value Ref Range   Sodium 141 135 -  145 mmol/L   Potassium 3.3 (L) 3.5 - 5.1 mmol/L   Chloride 106 96 - 112 mmol/L   CO2 30 19 - 32 mmol/L   Glucose, Bld 175 (H) 70 - 99 mg/dL   BUN 35 (H) 6 - 23 mg/dL   Creatinine, Ser 1.44 (H) 0.50 - 1.35 mg/dL   Calcium 7.9 (L) 8.4 - 10.5 mg/dL   GFR calc non Af Amer 53 (L) >90 mL/min   GFR calc Af Amer 61 (L) >90 mL/min    Comment: (NOTE) The eGFR has been calculated using the CKD EPI equation. This calculation has not been validated in all clinical situations. eGFR's persistently <90 mL/min signify possible Chronic Kidney Disease.    Anion gap 5 5 - 15  CBC     Status: Abnormal   Collection Time: 10/06/14  6:15 AM  Result Value Ref Range   WBC 3.0 (L) 4.0 - 10.5 K/uL   RBC 3.52 (L) 4.22 - 5.81 MIL/uL   Hemoglobin 10.6 (L) 13.0 - 17.0 g/dL   HCT 31.9 (L) 39.0 - 52.0 %   MCV 90.6 78.0 - 100.0 fL   MCH 30.1 26.0 - 34.0 pg   MCHC 33.2 30.0 - 36.0 g/dL   RDW 13.1 11.5 - 15.5 %   Platelets 34 (L) 150 - 400 K/uL    Comment: SPECIMEN CHECKED FOR CLOTS PLATELET COUNT CONFIRMED BY SMEAR   Culture, blood (routine x 2)     Status: None (Preliminary result)   Collection Time: 10/06/14  6:15 AM  Result Value Ref Range   Specimen Description Blood    Special Requests Normal    Culture NO GROWTH <24 HRS    Report Status PENDING   Culture, blood (routine x 2)     Status: None (Preliminary result)   Collection Time: 10/06/14  6:15 AM  Result Value Ref Range   Specimen Description Blood    Special Requests Normal    Culture NO GROWTH <24 HRS    Report Status PENDING   Glucose, capillary     Status: Abnormal   Collection Time: 10/06/14  7:53 AM  Result Value Ref Range   Glucose-Capillary 167 (H) 70 - 99 mg/dL    US Venous Img Upper Uni Left  10/05/2014   CLINICAL DATA:  Left upper extremity pain, redness and purulent drainage at site of prior IV infiltration. Evaluate for DVT.  EXAM: LEFT UPPER EXTREMITY VENOUS DOPPLER ULTRASOUND  TECHNIQUE: Gray-scale sonography with graded  compression, as well as color Doppler and duplex ultrasound were performed to evaluate the upper extremity deep venous system from the level of the subclavian vein and including the jugular, axillary, basilic, radial, ulnar and upper cephalic vein. Spectral Doppler was utilized to evaluate flow at rest and with distal augmentation maneuvers.  COMPARISON:  None.  FINDINGS: Contralateral Subclavian Vein: Respiratory phasicity is normal and symmetric with the symptomatic side. No evidence of thrombus. Normal compressibility.  Internal Jugular Vein: Nonocclusive thrombus in the left jugular vein. The vessel is partially compressible.  Subclavian Vein: No evidence of thrombus. Normal compressibility, respiratory phasicity and response to augmentation.  Axillary Vein: No evidence of thrombus. Normal compressibility, respiratory phasicity and response to augmentation.  Cephalic Vein: Echogenic material fills the cephalic vein at the elbow. The vessel is noncompressible. This is consistent with occlusive superficial thrombosis. Thrombus extends up the arm almost to the shoulder.  Basilic Vein: Segment of incompressible vessel just below the elbow. Echogenic material fills the lumen of the vein consistent with thrombus.  Brachial Veins: No evidence of thrombus. Normal compressibility, respiratory phasicity and response to augmentation.  Radial Veins: No evidence of thrombus. Normal compressibility, respiratory phasicity and response to augmentation.  Ulnar Veins: No evidence of thrombus. Normal compressibility, respiratory phasicity and response to augmentation.  Venous Reflux:  None visualized.  Other Findings: Extensive subcutaneous edema in the lower arm and forearm.  IMPRESSION: 1. Nonocclusive thrombus in the left jugular vein. This may be acute, or subacute. Has the patient had a recent left IJ approach central line? 2. Positive for superficial venous thrombosis involving the cephalic vein beginning at the elbow and  extending up the arm and nearly to the shoulder and within a short segment of the basilic vein just below the elbow. No definite propagation into the deep brachial or axillary veins. 3. Extensive subcutaneous edema in the lower arm and forearm.   Electronically Signed   By: Jacqulynn Cadet M.D.   On: 10/05/2014 16:33    ROS: See chart Blood pressure 125/53, pulse 81, temperature 97 F (36.1 C), temperature source Axillary, resp. rate 15, height _0  (1.753 m), weight 59 kg (130 lb 1.1 oz), SpO2 99 %. Physical Exam: Sedated white male Left wrist with superficial cellulitis at left wrist, improving. No significant purulent drainage is noted. This appears improved since yesterday. No ascending cellulitis noted in left forearm. No significant swelling present.  Assessment/Plan: Impression: Cellulitis, left wrist. Resolving. Thrombosis, left internal jugular and superficial venous system of left arm Plan: Patient only needs local wound care for left wrist. Patient is already anticoagulated. Please call me for persistence. No need for acute surgical condition.  Derrick Butler A 10/06/2014, 12:39 PM

## 2014-10-06 NOTE — Progress Notes (Addendum)
Subjective: Patient remained sedated. No change in his mental status. He is growing MRSA in his blood. He is already on vancomycin.  His upper extremity ultrasound showed nonocclusive left juglar thrombus and positive superficial venous thrombosis. His platelet is also dropping patient is not on heparin. Objective: Vital signs in last 24 hours: Temp:  [97.4 F (36.3 C)-98.5 F (36.9 C)] 97.7 F (36.5 C) (02/02 0400) Pulse Rate:  [74-96] 74 (02/02 0500) Resp:  [15-27] 19 (02/02 0500) BP: (104-150)/(32-77) 121/56 mmHg (02/02 0500) SpO2:  [94 %-100 %] 95 % (02/02 0500) Weight change:  Last BM Date: 10/04/14  Intake/Output from previous day: 02/01 0701 - 02/02 0700 In: 1770.8 [P.O.:120; I.V.:1440.8; IV Piggyback:210] Out: 1125 [Urine:1125]  PHYSICAL EXAM General appearance: no distress and slowed mentation Resp: diminished breath sounds bilaterally and rhonchi bilaterally Cardio: S1, S2 normal GI: soft, non-tender; bowel sounds normal; no masses,  no organomegaly Extremities: extremities normal, atraumatic, no cyanosis or edema  Lab Results:  Results for orders placed or performed during the hospital encounter of 09/25/14 (from the past 48 hour(s))  Glucose, capillary     Status: Abnormal   Collection Time: 10/04/14 11:25 AM  Result Value Ref Range   Glucose-Capillary 132 (H) 70 - 99 mg/dL  Glucose, capillary     Status: Abnormal   Collection Time: 10/04/14  4:39 PM  Result Value Ref Range   Glucose-Capillary 158 (H) 70 - 99 mg/dL  Glucose, capillary     Status: Abnormal   Collection Time: 10/04/14  7:52 PM  Result Value Ref Range   Glucose-Capillary 156 (H) 70 - 99 mg/dL  Glucose, capillary     Status: Abnormal   Collection Time: 10/05/14 12:09 AM  Result Value Ref Range   Glucose-Capillary 113 (H) 70 - 99 mg/dL  Glucose, capillary     Status: Abnormal   Collection Time: 10/05/14  4:14 AM  Result Value Ref Range   Glucose-Capillary 150 (H) 70 - 99 mg/dL  Basic metabolic  panel     Status: Abnormal   Collection Time: 10/05/14  5:11 AM  Result Value Ref Range   Sodium 140 135 - 145 mmol/L   Potassium 3.2 (L) 3.5 - 5.1 mmol/L   Chloride 106 96 - 112 mmol/L   CO2 28 19 - 32 mmol/L   Glucose, Bld 163 (H) 70 - 99 mg/dL   BUN 36 (H) 6 - 23 mg/dL   Creatinine, Ser 1.60 (H) 0.50 - 1.35 mg/dL   Calcium 7.7 (L) 8.4 - 10.5 mg/dL   GFR calc non Af Amer 46 (L) >90 mL/min   GFR calc Af Amer 54 (L) >90 mL/min    Comment: (NOTE) The eGFR has been calculated using the CKD EPI equation. This calculation has not been validated in all clinical situations. eGFR's persistently <90 mL/min signify possible Chronic Kidney Disease.    Anion gap 6 5 - 15  Glucose, capillary     Status: Abnormal   Collection Time: 10/05/14  7:56 AM  Result Value Ref Range   Glucose-Capillary 120 (H) 70 - 99 mg/dL   Comment 1 Notify RN   Valproic acid level     Status: Abnormal   Collection Time: 10/05/14 11:30 AM  Result Value Ref Range   Valproic Acid Lvl 111.5 (H) 50.0 - 100.0 ug/mL  Glucose, capillary     Status: Abnormal   Collection Time: 10/05/14 12:01 PM  Result Value Ref Range   Glucose-Capillary 149 (H) 70 - 99 mg/dL  Comment 1 Notify RN   Glucose, capillary     Status: Abnormal   Collection Time: 10/05/14  4:04 PM  Result Value Ref Range   Glucose-Capillary 139 (H) 70 - 99 mg/dL   Comment 1 Notify RN   Glucose, capillary     Status: Abnormal   Collection Time: 10/05/14  9:08 PM  Result Value Ref Range   Glucose-Capillary 163 (H) 70 - 99 mg/dL   Comment 1 Notify RN   Basic metabolic panel     Status: Abnormal   Collection Time: 10/06/14  6:15 AM  Result Value Ref Range   Sodium 141 135 - 145 mmol/L   Potassium 3.3 (L) 3.5 - 5.1 mmol/L   Chloride 106 96 - 112 mmol/L   CO2 30 19 - 32 mmol/L   Glucose, Bld 175 (H) 70 - 99 mg/dL   BUN 35 (H) 6 - 23 mg/dL   Creatinine, Ser 1.44 (H) 0.50 - 1.35 mg/dL   Calcium 7.9 (L) 8.4 - 10.5 mg/dL   GFR calc non Af Amer 53 (L) >90  mL/min   GFR calc Af Amer 61 (L) >90 mL/min    Comment: (NOTE) The eGFR has been calculated using the CKD EPI equation. This calculation has not been validated in all clinical situations. eGFR's persistently <90 mL/min signify possible Chronic Kidney Disease.    Anion gap 5 5 - 15  CBC     Status: Abnormal   Collection Time: 10/06/14  6:15 AM  Result Value Ref Range   WBC 3.0 (L) 4.0 - 10.5 K/uL   RBC 3.52 (L) 4.22 - 5.81 MIL/uL   Hemoglobin 10.6 (L) 13.0 - 17.0 g/dL   HCT 31.9 (L) 39.0 - 52.0 %   MCV 90.6 78.0 - 100.0 fL   MCH 30.1 26.0 - 34.0 pg   MCHC 33.2 30.0 - 36.0 g/dL   RDW 13.1 11.5 - 15.5 %   Platelets 34 (L) 150 - 400 K/uL    Comment: SPECIMEN CHECKED FOR CLOTS PLATELET COUNT CONFIRMED BY SMEAR   Glucose, capillary     Status: Abnormal   Collection Time: 10/06/14  7:53 AM  Result Value Ref Range   Glucose-Capillary 167 (H) 70 - 99 mg/dL    ABGS No results for input(s): PHART, PO2ART, TCO2, HCO3 in the last 72 hours.  Invalid input(s): PCO2 CULTURES Recent Results (from the past 240 hour(s))  Culture, blood (routine x 2)     Status: None   Collection Time: 10/03/14  9:58 AM  Result Value Ref Range Status   Specimen Description BLOOD RIGHT ANTECUBITAL  Final   Special Requests BOTTLES DRAWN AEROBIC AND ANAEROBIC 6CC  Final   Culture   Final    STAPHYLOCOCCUS AUREUS Note: Gram Stain Report Called to,Read Back By and Verified With: LEE B _0  ON 10/04/14 BY WOODIE J SUSCEPTIBILITIES PERFORMED ON PREVIOUS CULTURE WITHIN THE LAST 5 DAYS. Performed at [facility]    Report Status 10/06/2014 FINAL  Final  Culture, blood (routine x 2)     Status: None   Collection Time: 10/03/14  9:59 AM  Result Value Ref Range Status   Specimen Description BLOOD RIGHT ARM  Final   Special Requests BOTTLES DRAWN AEROBIC AND ANAEROBIC 8CC  Final   Culture   Final    METHICILLIN RESISTANT STAPHYLOCOCCUS AUREUS Note: Gram Stain Report Called to,Read Back By and Verified  With: LEE B @ 0330 ON 10/04/14 BY WOODIEB RIFAMPIN AND GENTAMICIN SHOULD NOT BE USED  AS SINGLE DRUGS FOR TREATMENT OF STAPH INFECTIONS. This organism DOES NOT demonstrate inducible Clindamycin  resistance in vitro. CRITICAL RESULT CALLED TO, READ BACK BY AND VERIFIED WITH: Waynard Reeds RN AT [facility] AT [phone] BY [facility] Performed at [facility]    Report Status 10/06/2014 FINAL  Final   Organism ID, Bacteria METHICILLIN RESISTANT STAPHYLOCOCCUS AUREUS  Final      Susceptibility   Methicillin resistant staphylococcus aureus - MIC*    CLINDAMYCIN <=0.25 SENSITIVE Sensitive     ERYTHROMYCIN >=8 RESISTANT Resistant     GENTAMICIN <=0.5 SENSITIVE Sensitive     LEVOFLOXACIN 4 INTERMEDIATE Intermediate     OXACILLIN >=4 RESISTANT Resistant     PENICILLIN >=0.5 RESISTANT Resistant     RIFAMPIN <=0.5 SENSITIVE Sensitive     TRIMETH/SULFA <=10 SENSITIVE Sensitive     VANCOMYCIN <=0.5 SENSITIVE Sensitive     TETRACYCLINE <=1 SENSITIVE Sensitive     * METHICILLIN RESISTANT STAPHYLOCOCCUS AUREUS  Culture, Urine     Status: None   Collection Time: 10/03/14 10:00 AM  Result Value Ref Range Status   Specimen Description URINE, CATHETERIZED  Final   Special Requests NONE  Final   Colony Count NO GROWTH Performed at [facility]   Final   Culture NO GROWTH Performed at [facility]   Final   Report Status 10/04/2014 FINAL  Final   Studies/Results: US Venous Img Upper Uni Left  10/05/2014   CLINICAL DATA:  Left upper extremity pain, redness and purulent drainage at site of prior IV infiltration. Evaluate for DVT.  EXAM: LEFT UPPER EXTREMITY VENOUS DOPPLER ULTRASOUND  TECHNIQUE: Gray-scale sonography with graded compression, as well as color Doppler and duplex ultrasound were performed to evaluate the upper extremity deep venous system from the level of the subclavian vein and including the jugular, axillary, basilic, radial, ulnar and upper cephalic vein. Spectral Doppler  was utilized to evaluate flow at rest and with distal augmentation maneuvers.  COMPARISON:  None.  FINDINGS: Contralateral Subclavian Vein: Respiratory phasicity is normal and symmetric with the symptomatic side. No evidence of thrombus. Normal compressibility.  Internal Jugular Vein: Nonocclusive thrombus in the left jugular vein. The vessel is partially compressible.  Subclavian Vein: No evidence of thrombus. Normal compressibility, respiratory phasicity and response to augmentation.  Axillary Vein: No evidence of thrombus. Normal compressibility, respiratory phasicity and response to augmentation.  Cephalic Vein: Echogenic material fills the cephalic vein at the elbow. The vessel is noncompressible. This is consistent with occlusive superficial thrombosis. Thrombus extends up the arm almost to the shoulder.  Basilic Vein: Segment of incompressible vessel just below the elbow. Echogenic material fills the lumen of the vein consistent with thrombus.  Brachial Veins: No evidence of thrombus. Normal compressibility, respiratory phasicity and response to augmentation.  Radial Veins: No evidence of thrombus. Normal compressibility, respiratory phasicity and response to augmentation.  Ulnar Veins: No evidence of thrombus. Normal compressibility, respiratory phasicity and response to augmentation.  Venous Reflux:  None visualized.  Other Findings: Extensive subcutaneous edema in the lower arm and forearm.  IMPRESSION: 1. Nonocclusive thrombus in the left jugular vein. This may be acute, or subacute. Has the patient had a recent left IJ approach central line? 2. Positive for superficial venous thrombosis involving the cephalic vein beginning at the elbow and extending up the arm and nearly to the shoulder and within a short segment of the basilic vein just below the elbow. No definite propagation into the deep brachial or axillary  veins. 3. Extensive subcutaneous edema in the lower arm and forearm.   Electronically Signed    By: Jacqulynn Cadet M.D.   On: 10/05/2014 16:33    Medications: I have reviewed the patient's current medications.  Assesment:   Principal Problem:   Hyperkalemia Active Problems:   Bipolar I disorder, most recent episode mixed   H/O: stroke   Dehydration   CVA, old, cognitive deficits   CKD (chronic kidney disease) stage 3, GFR 30-59 ml/min   Thrombocytopenia   Acute encephalopathy   Acute renal failure   Hypoglycemia MRSA bacteremia Left jagular thrombus thrombocytobenia  Plan:  Medications reviewed Will increase IV fluid to 125 ml/hr. Continue Iv vancomycin Talked to pharmacy to anticoagulate the patient for huis thrombus. Since he has thrombocytopenia alternate agent to heparin is being considered Will do hematology consult    LOS: 11 days   Derrick Butler 10/06/2014, 8:27 AM

## 2014-10-06 NOTE — Progress Notes (Signed)
Called Dr Legrand Rams & order received for TEE due to MRSA bacteremia.  Unable to place order.  RN to enter.   Netta Cedars, PharmD, BCPS 10/06/2014@4 :47 PM

## 2014-10-06 NOTE — Progress Notes (Signed)
Patient had critical positive blood culture gram positive MRSA.

## 2014-10-06 NOTE — Consult Note (Signed)
Inpatient Hematology/Oncology Consultation   Name: Derrick Butler      MRN: [id number]    Location: [facility]07-01  Date: 10/06/2014 Time:5:08 PM   REFERRING PHYSICIAN:  Dr. Legrand Rams  REASON FOR CONSULT:  Thrombocytopenia, L Internal Jugular Thrombosis  DIAGNOSIS:: Above  HISTORY OF PRESENT ILLNESS:   59 year old male with history of bipolar d/o and prior CVA (with resultant cognitive deficits) admitted with ARF/hyperkalemia and worsening MS.  Patient has a chronic baseline thrombocytopenia at least back to 2011 with counts in the low 100 thousand range.  During this hospitalization patient spiked a temperature on 1/29 with a Tmax over the next 24 hours up to 102.6. Two blood cultures were drawn from the R arm, one with MRSA.  Blood cultures redrawn today are pending. Patient on Vanocmycin and afebrile.[name]  He was noted to have cellulitis in the left wrist IV site.  LUE ultrasound showed thrombus in the superficial venous system of the left arm but also a non-occlusive thrombus in the Left Internal jugular vein, (Acute vs. Subacute)    PAST MEDICAL HISTORY:   Past Medical History  Diagnosis Date  . Aortic dissection   . Hypertension   . CVA (cerebral infarction)   . Aortic dissection   . Hypertension   . CVA (cerebral infarction)   . Diabetes mellitus   . Cardiomyopathy   . Hyperglycemia   . Bipolar 1 disorder   . Hyperkalemia   . Coronary artery disease   . Hyponatremia   . Altered mental status     with psychosis  . Dehydration   . TIA (transient ischemic attack)   . Lack of coordination   . Cardiomyopathy     ALLERGIES: No Known Allergies    MEDICATIONS: I have reviewed the patient's current medications.     PAST SURGICAL HISTORY Past Surgical History  Procedure Laterality Date  . Ascending aortic aneurysm repair      FAMILY HISTORY: Family History  Problem Relation Age of Onset  . Alzheimer's disease Mother   . Stroke Father   . Heart disease Father   .  Bipolar disorder Father   . Bipolar disorder Sister   . Bipolar disorder Sister     SOCIAL HISTORY:  reports that he has never smoked. He does not have any smokeless tobacco history on file. He reports that he does not drink alcohol or use illicit drugs.  PERFORMANCE STATUS: The patient's performance status is 2 - Symptomatic, <50% confined to bed  PHYSICAL EXAM: Most Recent Vital Signs: Blood pressure 141/95, pulse 87, temperature 97 F (36.1 C), temperature source Axillary, resp. rate 17, height _0  (1.753 m), weight 130 lb 1.1 oz (59 kg), SpO2 99 %. General appearance: alert, distracted, no distress and slowed mentation Head: Normocephalic, without obvious abnormality, atraumatic Eyes: negative, conjunctivae/corneas clear. PERRL, EOM's intact. Fundi benign. Throat: lips, mucosa, and tongue normal; teeth and gums normal Lungs: clear to auscultation bilaterally Heart: regular rate and rhythm, S1, S2 normal, no murmur, click, rub or gallop Abdomen: soft, non-tender; bowel sounds normal; no masses,  no organomegaly Extremities: extremities normal, atraumatic, no cyanosis or edema. L AC is warm, erythematous and swollen, firm to palpation Skin: Skin color, texture, turgor normal. No rashes or lesions Neurologic: Cranial nerves: normal  LABORATORY DATA:  Results for orders placed or performed during the hospital encounter of 09/25/14 (from the past 48 hour(s))  Glucose, capillary     Status: Abnormal   Collection Time: 10/04/14  7:52 PM  Result Value Ref Range   Glucose-Capillary 156 (H) 70 - 99 mg/dL  Glucose, capillary     Status: Abnormal   Collection Time: 10/05/14 12:09 AM  Result Value Ref Range   Glucose-Capillary 113 (H) 70 - 99 mg/dL  Glucose, capillary     Status: Abnormal   Collection Time: 10/05/14  4:14 AM  Result Value Ref Range   Glucose-Capillary 150 (H) 70 - 99 mg/dL  Basic metabolic panel     Status: Abnormal   Collection Time: 10/05/14  5:11 AM  Result Value  Ref Range   Sodium 140 135 - 145 mmol/L   Potassium 3.2 (L) 3.5 - 5.1 mmol/L   Chloride 106 96 - 112 mmol/L   CO2 28 19 - 32 mmol/L   Glucose, Bld 163 (H) 70 - 99 mg/dL   BUN 36 (H) 6 - 23 mg/dL   Creatinine, Ser 1.60 (H) 0.50 - 1.35 mg/dL   Calcium 7.7 (L) 8.4 - 10.5 mg/dL   GFR calc non Af Amer 46 (L) >90 mL/min   GFR calc Af Amer 54 (L) >90 mL/min    Comment: (NOTE) The eGFR has been calculated using the CKD EPI equation. This calculation has not been validated in all clinical situations. eGFR's persistently <90 mL/min signify possible Chronic Kidney Disease.    Anion gap 6 5 - 15  Glucose, capillary     Status: Abnormal   Collection Time: 10/05/14  7:56 AM  Result Value Ref Range   Glucose-Capillary 120 (H) 70 - 99 mg/dL   Comment 1 Notify RN   Valproic acid level     Status: Abnormal   Collection Time: 10/05/14 11:30 AM  Result Value Ref Range   Valproic Acid Lvl 111.5 (H) 50.0 - 100.0 ug/mL  Glucose, capillary     Status: Abnormal   Collection Time: 10/05/14 12:01 PM  Result Value Ref Range   Glucose-Capillary 149 (H) 70 - 99 mg/dL   Comment 1 Notify RN   Glucose, capillary     Status: Abnormal   Collection Time: 10/05/14  4:04 PM  Result Value Ref Range   Glucose-Capillary 139 (H) 70 - 99 mg/dL   Comment 1 Notify RN   Glucose, capillary     Status: Abnormal   Collection Time: 10/05/14  9:08 PM  Result Value Ref Range   Glucose-Capillary 163 (H) 70 - 99 mg/dL   Comment 1 Notify RN   Basic metabolic panel     Status: Abnormal   Collection Time: 10/06/14  6:15 AM  Result Value Ref Range   Sodium 141 135 - 145 mmol/L   Potassium 3.3 (L) 3.5 - 5.1 mmol/L   Chloride 106 96 - 112 mmol/L   CO2 30 19 - 32 mmol/L   Glucose, Bld 175 (H) 70 - 99 mg/dL   BUN 35 (H) 6 - 23 mg/dL   Creatinine, Ser 1.44 (H) 0.50 - 1.35 mg/dL   Calcium 7.9 (L) 8.4 - 10.5 mg/dL   GFR calc non Af Amer 53 (L) >90 mL/min   GFR calc Af Amer 61 (L) >90 mL/min    Comment: (NOTE) The eGFR has  been calculated using the CKD EPI equation. This calculation has not been validated in all clinical situations. eGFR's persistently <90 mL/min signify possible Chronic Kidney Disease.    Anion gap 5 5 - 15  CBC     Status: Abnormal   Collection Time: 10/06/14  6:15 AM  Result Value Ref Range  WBC 3.0 (L) 4.0 - 10.5 K/uL   RBC 3.52 (L) 4.22 - 5.81 MIL/uL   Hemoglobin 10.6 (L) 13.0 - 17.0 g/dL   HCT 31.9 (L) 39.0 - 52.0 %   MCV 90.6 78.0 - 100.0 fL   MCH 30.1 26.0 - 34.0 pg   MCHC 33.2 30.0 - 36.0 g/dL   RDW 13.1 11.5 - 15.5 %   Platelets 34 (L) 150 - 400 K/uL    Comment: SPECIMEN CHECKED FOR CLOTS PLATELET COUNT CONFIRMED BY SMEAR   Culture, blood (routine x 2)     Status: None (Preliminary result)   Collection Time: 10/06/14  6:15 AM  Result Value Ref Range   Specimen Description Blood    Special Requests Normal    Culture NO GROWTH <24 HRS    Report Status PENDING   Culture, blood (routine x 2)     Status: None (Preliminary result)   Collection Time: 10/06/14  6:15 AM  Result Value Ref Range   Specimen Description Blood    Special Requests Normal    Culture NO GROWTH <24 HRS    Report Status PENDING   Glucose, capillary     Status: Abnormal   Collection Time: 10/06/14  7:53 AM  Result Value Ref Range   Glucose-Capillary 167 (H) 70 - 99 mg/dL  Glucose, capillary     Status: Abnormal   Collection Time: 10/06/14 11:53 AM  Result Value Ref Range   Glucose-Capillary 243 (H) 70 - 99 mg/dL   Comment 1 Notify RN   Glucose, capillary     Status: Abnormal   Collection Time: 10/06/14  4:32 PM  Result Value Ref Range   Glucose-Capillary 234 (H) 70 - 99 mg/dL   Comment 1 Notify RN       RADIOGRAPHY: US Venous Img Upper Uni Left  10/05/2014   CLINICAL DATA:  Left upper extremity pain, redness and purulent drainage at site of prior IV infiltration. Evaluate for DVT.  EXAM: LEFT UPPER EXTREMITY VENOUS DOPPLER ULTRASOUND  TECHNIQUE: Gray-scale sonography with graded compression,  as well as color Doppler and duplex ultrasound were performed to evaluate the upper extremity deep venous system from the level of the subclavian vein and including the jugular, axillary, basilic, radial, ulnar and upper cephalic vein. Spectral Doppler was utilized to evaluate flow at rest and with distal augmentation maneuvers.  COMPARISON:  None.  FINDINGS: Contralateral Subclavian Vein: Respiratory phasicity is normal and symmetric with the symptomatic side. No evidence of thrombus. Normal compressibility.  Internal Jugular Vein: Nonocclusive thrombus in the left jugular vein. The vessel is partially compressible.  Subclavian Vein: No evidence of thrombus. Normal compressibility, respiratory phasicity and response to augmentation.  Axillary Vein: No evidence of thrombus. Normal compressibility, respiratory phasicity and response to augmentation.  Cephalic Vein: Echogenic material fills the cephalic vein at the elbow. The vessel is noncompressible. This is consistent with occlusive superficial thrombosis. Thrombus extends up the arm almost to the shoulder.  Basilic Vein: Segment of incompressible vessel just below the elbow. Echogenic material fills the lumen of the vein consistent with thrombus.  Brachial Veins: No evidence of thrombus. Normal compressibility, respiratory phasicity and response to augmentation.  Radial Veins: No evidence of thrombus. Normal compressibility, respiratory phasicity and response to augmentation.  Ulnar Veins: No evidence of thrombus. Normal compressibility, respiratory phasicity and response to augmentation.  Venous Reflux:  None visualized.  Other Findings: Extensive subcutaneous edema in the lower arm and forearm.  IMPRESSION: 1. Nonocclusive thrombus in the left  jugular vein. This may be acute, or subacute. Has the patient had a recent left IJ approach central line? 2. Positive for superficial venous thrombosis involving the cephalic vein beginning at the elbow and extending up  the arm and nearly to the shoulder and within a short segment of the basilic vein just below the elbow. No definite propagation into the deep brachial or axillary veins. 3. Extensive subcutaneous edema in the lower arm and forearm.   Electronically Signed   By: Jacqulynn Cadet M.D.   On: 10/05/2014 16:33      ASSESSMENT:   Thrombocytopenia Left Internal Jugular non-occlusive thrombus (acute vs subacute)  PLAN:  Repeat CBC in am, last CBC prior to one today was on 1/27 with platelet count of 75K. I see no evidence of heparin on his MAR. Worsening thrombocytopenia most likely from recent bacteremia.   In regards to his IJ thrombus, it is part of the deep venous system of the upper extremity and would treat. Recent study from [facility] (although on cancer patients) suggests using 1/2 dose lovenox for anticoagulation in patients with thrombus and platelets under 50K.  Will advise nursing to closely monitor for any signs of bleeding. We will dose and follow.  Recommend at least 6 months of anticoagualtion. We will have to follow-up as outpatient.  Khamani Daniely [employer]

## 2014-10-06 NOTE — Progress Notes (Signed)
ANTIBIOTIC CONSULT NOTE -   Pharmacy Consult for Vancomycin Indication: bacteremia  No Known Allergies  Patient Measurements: Height: 5\' 9"  (175.3 cm) Weight: 130 lb 1.1 oz (59 kg) IBW/kg (Calculated) : 70.7  Vital Signs: Temp: 97.7 F (36.5 C) (02/02 0400) Temp Source: Axillary (02/02 0400) BP: 121/56 mmHg (02/02 0500) Pulse Rate: 74 (02/02 0500) Intake/Output from previous day: 02/01 0701 - 02/02 0700 In: 1770.8 [P.O.:120; I.V.:1440.8; IV Piggyback:210] Out: 1125 [Urine:1125] Intake/Output from this shift:    Labs:  Recent Labs  10/05/14 0511 10/06/14 0615  WBC  --  3.0*  HGB  --  10.6*  PLT  --  34*  CREATININE 1.60* 1.44*   Estimated Creatinine Clearance: 47.2 mL/min (by C-G formula based on Cr of 1.44). No results for input(s): VANCOTROUGH, VANCOPEAK, VANCORANDOM, GENTTROUGH, GENTPEAK, GENTRANDOM, TOBRATROUGH, TOBRAPEAK, TOBRARND, AMIKACINPEAK, AMIKACINTROU, AMIKACIN in the last 72 hours.   Microbiology: Recent Results (from the past 720 hour(s))  MRSA PCR Screening     Status: Abnormal   Collection Time: 09/25/14  2:57 PM  Result Value Ref Range Status   MRSA by PCR POSITIVE (A) NEGATIVE Final    Comment:        The GeneXpert MRSA Assay (FDA approved for NASAL specimens only), is one component of a comprehensive MRSA colonization surveillance program. It is not intended to diagnose MRSA infection nor to guide or monitor treatment for MRSA infections. RESULT CALLED TO, READ BACK BY AND VERIFIED WITH: NIELSON,T ON 09/25/14 AT 2105 BY LOY,C   Culture, blood (routine x 2)     Status: None   Collection Time: 10/03/14  9:58 AM  Result Value Ref Range Status   Specimen Description BLOOD RIGHT ANTECUBITAL  Final   Special Requests BOTTLES DRAWN AEROBIC AND ANAEROBIC 6CC  Final   Culture   Final    STAPHYLOCOCCUS AUREUS Note: Gram Stain Report Called to,Read Back By and Verified With: LEE B @0330  ON 10/04/14 BY WOODIE J SUSCEPTIBILITIES PERFORMED ON  PREVIOUS CULTURE WITHIN THE LAST 5 DAYS. Performed at [facility]    Report Status 10/06/2014 FINAL  Final  Culture, blood (routine x 2)     Status: None   Collection Time: 10/03/14  9:59 AM  Result Value Ref Range Status   Specimen Description BLOOD RIGHT ARM  Final   Special Requests BOTTLES DRAWN AEROBIC AND ANAEROBIC 8CC  Final   Culture   Final    METHICILLIN RESISTANT STAPHYLOCOCCUS AUREUS Note: Gram Stain Report Called to,Read Back By and Verified With: LEE B @ 0330 ON 10/04/14 BY WOODIEB RIFAMPIN AND GENTAMICIN SHOULD NOT BE USED AS SINGLE DRUGS FOR TREATMENT OF STAPH INFECTIONS. This organism DOES NOT demonstrate inducible Clindamycin  resistance in vitro. CRITICAL RESULT CALLED TO, READ BACK BY AND VERIFIED WITH: Waynard Reeds RN AT [facility] AT [phone] BY CASTC Performed at [facility]    Report Status 10/06/2014 FINAL  Final   Organism ID, Bacteria METHICILLIN RESISTANT STAPHYLOCOCCUS AUREUS  Final      Susceptibility   Methicillin resistant staphylococcus aureus - MIC*    CLINDAMYCIN <=0.25 SENSITIVE Sensitive     ERYTHROMYCIN >=8 RESISTANT Resistant     GENTAMICIN <=0.5 SENSITIVE Sensitive     LEVOFLOXACIN 4 INTERMEDIATE Intermediate     OXACILLIN >=4 RESISTANT Resistant     PENICILLIN >=0.5 RESISTANT Resistant     RIFAMPIN <=0.5 SENSITIVE Sensitive     TRIMETH/SULFA <=10 SENSITIVE Sensitive     VANCOMYCIN <=0.5 SENSITIVE Sensitive  TETRACYCLINE <=1 SENSITIVE Sensitive     * METHICILLIN RESISTANT STAPHYLOCOCCUS AUREUS  Culture, Urine     Status: None   Collection Time: 10/03/14 10:00 AM  Result Value Ref Range Status   Specimen Description URINE, CATHETERIZED  Final   Special Requests NONE  Final   Colony Count NO GROWTH Performed at [facility]   Final   Culture NO GROWTH Performed at [facility]   Final   Report Status 10/04/2014 FINAL  Final   Medical History: Past Medical History  Diagnosis Date  . Aortic  dissection   . Hypertension   . CVA (cerebral infarction)   . Aortic dissection   . Hypertension   . CVA (cerebral infarction)   . Diabetes mellitus   . Cardiomyopathy   . Hyperglycemia   . Bipolar 1 disorder   . Hyperkalemia   . Coronary artery disease   . Hyponatremia   . Altered mental status     with psychosis  . Dehydration   . TIA (transient ischemic attack)   . Lack of coordination   . Cardiomyopathy    Anti-infectives    Start     Dose/Rate Route Frequency Ordered Stop   10/06/14 0900  vancomycin (VANCOCIN) IVPB 1000 mg/200 mL premix     1,000 mg200 mL/hr over 60 Minutes Intravenous Every 24 hours 10/05/14 1105     10/05/14 1500  ceFAZolin (ANCEF) IVPB 2 g/50 mL premix  Status:  Discontinued     2 g100 mL/hr over 30 Minutes Intravenous 3 times per day 10/05/14 1413 10/06/14 0802   10/04/14 1800  vancomycin (VANCOCIN) IVPB 750 mg/150 ml premix  Status:  Discontinued     750 mg150 mL/hr over 60 Minutes Intravenous Every 12 hours 10/04/14 1005 10/05/14 1104   10/04/14 1300  piperacillin-tazobactam (ZOSYN) IVPB 3.375 g  Status:  Discontinued     3.375 g12.5 mL/hr over 240 Minutes Intravenous Every 8 hours 10/04/14 1003 10/05/14 1411   10/04/14 1100  vancomycin (VANCOCIN) 500 mg in sodium chloride 0.9 % 100 mL IVPB     500 mg100 mL/hr over 60 Minutes Intravenous NOW 10/04/14 1007 10/04/14 1201   10/04/14 0345  vancomycin (VANCOCIN) 500 mg in sodium chloride 0.9 % 100 mL IVPB     500 mg100 mL/hr over 60 Minutes Intravenous  Once 10/04/14 0335 10/04/14 0527   10/04/14 0345  piperacillin-tazobactam (ZOSYN) IVPB 3.375 g     3.375 g12.5 mL/hr over 240 Minutes Intravenous  Once 10/04/14 0337 10/04/14 0826     Assessment: 89 yoM with MRSA bacteremia likely from infected line.  Repeat blood cx pending.  He has remained afebrile for past 24hrs.   Acute on chronic renal insufficieny on admission noted.  Since admission Scr has fluctuated 1.21-1.76.  24hr I/O +645.26ml  Vancomycin  1/31>>  Goal of Therapy:  Vancomycin trough level 15-20 mcg/ml Eradicate infection.  Plan:   Continue Vancomycin 1000mg  IV q24hrs  Check Vancomycin trough at steady state  D/C Ancef   F/U repeat blood cx, renal fxn, TEE results  Armstrong Creasy, Lavonia Drafts 10/06/2014,8:19 AM

## 2014-10-07 ENCOUNTER — Other Ambulatory Visit (HOSPITAL_COMMUNITY): Payer: Self-pay

## 2014-10-07 DIAGNOSIS — Z9181 History of falling: Secondary | ICD-10-CM

## 2014-10-07 LAB — GLUCOSE, CAPILLARY
Glucose-Capillary: 167 mg/dL — ABNORMAL HIGH (ref 70–99)
Glucose-Capillary: 141 mg/dL — ABNORMAL HIGH (ref 70–99)
Glucose-Capillary: 168 mg/dL — ABNORMAL HIGH (ref 70–99)
Glucose-Capillary: 114 mg/dL — ABNORMAL HIGH (ref 70–99)

## 2014-10-07 LAB — CBC WITH DIFFERENTIAL/PLATELET
BASOS PCT: 0 % (ref 0–1)
Basophils Absolute: 0 10*3/uL (ref 0.0–0.1)
Eosinophils Absolute: 0 10*3/uL (ref 0.0–0.7)
Eosinophils Relative: 1 % (ref 0–5)
HEMATOCRIT: 30.3 % — AB (ref 39.0–52.0)
Hemoglobin: 9.9 g/dL — ABNORMAL LOW (ref 13.0–17.0)
LYMPHS ABS: 0.7 10*3/uL (ref 0.7–4.0)
Lymphocytes Relative: 19 % (ref 12–46)
MCH: 29.4 pg (ref 26.0–34.0)
MCHC: 32.7 g/dL (ref 30.0–36.0)
MCV: 89.9 fL (ref 78.0–100.0)
Monocytes Absolute: 0.5 10*3/uL (ref 0.1–1.0)
Monocytes Relative: 12 % (ref 3–12)
NEUTROS ABS: 2.6 10*3/uL (ref 1.7–7.7)
Neutrophils Relative %: 69 % (ref 43–77)
PLATELETS: 31 10*3/uL — AB (ref 150–400)
RBC: 3.37 MIL/uL — AB (ref 4.22–5.81)
RDW: 12.9 % (ref 11.5–15.5)
WBC: 3.7 10*3/uL — AB (ref 4.0–10.5)

## 2014-10-07 LAB — RETICULOCYTES
RBC.: 3.53 MIL/uL — ABNORMAL LOW (ref 4.22–5.81)
RETIC CT PCT: 0.4 % (ref 0.4–3.1)
Retic Count, Absolute: 14.1 10*3/uL — ABNORMAL LOW (ref 19.0–186.0)

## 2014-10-07 LAB — CBC
HCT: 31.2 % — ABNORMAL LOW (ref 39.0–52.0)
Hemoglobin: 10.4 g/dL — ABNORMAL LOW (ref 13.0–17.0)
MCH: 29.5 pg (ref 26.0–34.0)
MCHC: 33.3 g/dL (ref 30.0–36.0)
MCV: 88.4 fL (ref 78.0–100.0)
PLATELETS: 28 10*3/uL — AB (ref 150–400)
RBC: 3.53 MIL/uL — ABNORMAL LOW (ref 4.22–5.81)
RDW: 12.8 % (ref 11.5–15.5)
WBC: 5.8 10*3/uL (ref 4.0–10.5)

## 2014-10-07 LAB — IRON AND TIBC: UIBC: 126 ug/dL (ref 125–400)

## 2014-10-07 LAB — URINE MICROSCOPIC-ADD ON

## 2014-10-07 LAB — BASIC METABOLIC PANEL
ANION GAP: 5 (ref 5–15)
BUN: 35 mg/dL — AB (ref 6–23)
CHLORIDE: 104 mmol/L (ref 96–112)
CO2: 26 mmol/L (ref 19–32)
Calcium: 7.4 mg/dL — ABNORMAL LOW (ref 8.4–10.5)
Creatinine, Ser: 1.28 mg/dL (ref 0.50–1.35)
GFR calc Af Amer: 70 mL/min — ABNORMAL LOW (ref >90)
GFR, EST NON AFRICAN AMERICAN: 61 mL/min — AB (ref >90)
GLUCOSE: 180 mg/dL — AB (ref 70–99)
Potassium: 3.2 mmol/L — ABNORMAL LOW (ref 3.5–5.1)
Sodium: 135 mmol/L (ref 135–145)

## 2014-10-07 LAB — URINALYSIS, ROUTINE W REFLEX MICROSCOPIC
GLUCOSE, UA: NEGATIVE mg/dL
LEUKOCYTES UA: NEGATIVE
NITRITE: NEGATIVE
Protein, ur: 100 mg/dL — AB
Specific Gravity, Urine: 1.025 (ref 1.005–1.030)
UROBILINOGEN UA: 1 mg/dL (ref 0.0–1.0)
pH: 5.5 (ref 5.0–8.0)

## 2014-10-07 MED ORDER — POTASSIUM CHLORIDE 10 MEQ/100ML IV SOLN
10.0000 meq | INTRAVENOUS | Status: AC
  Administered 2014-10-07 (×2): 10 meq via INTRAVENOUS
  Filled 2014-10-07: qty 100

## 2014-10-07 MED ORDER — VALPROIC ACID 250 MG PO CAPS
500.0000 mg | ORAL_CAPSULE | Freq: Two times a day (BID) | ORAL | Status: DC
  Administered 2014-10-08: 500 mg via ORAL
  Filled 2014-10-07 (×3): qty 2

## 2014-10-07 NOTE — Progress Notes (Signed)
After multiple attempts at trying to measure and place electrodes patient would not stop moving and would pull electrodes off as soon as tech would place them. Nurse notified. Will try again tomorrow 10/08/14

## 2014-10-07 NOTE — Progress Notes (Addendum)
Patient ID: Derrick Butler, male   DOB: 28-May-1956, 59 y.o.   MRN: [id number]  [name], MD     [email or web address]          Derrick Butler is an 59 y.o. male.   Assessment/Plan:  1. Toxic metabolic encephalopathy most likely due to acute renal failure. Dehydration and medication effect compounded by reduced clearance from acute renal impairment are also contributing factors. Also bacteremia now.   2. Agitated confusion and combativeness.   3. Baseline history of tremors. No evidence of tardive dyskinesia or parkinsonism at this time.  4. Baseline vascular dementia.  5. 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  6. Fever with MRSA. ECHO --P  7. Thrombocytopenia - suspect multifactorial. I reduced depakote - which can cause dose dependent thrombocytopenia  8. Anemia.  9. Hyperglycemia.     Still doing well.     GENERAL: Improved.   HEENT: Supple. Atraumatic normocephalic.   ABDOMEN: soft  EXTREMITIES: No edema. There are bandages/dressing seen involving multiple areas of the legs.   BACK: Normal.  SKIN: Normal by inspection.   MENTAL STATUS: The patient lays in bed with eyes closed. He wants his eyes to light sternal rub. He speaks in simple sentences and states that "he is doing okay man". He tells her that he does not want any more IV/injections. He follows midline commands and is occasionally appendicular commands. He continues to have some dyskinetic type movements of the head and extremities although these are much improved.  CRANIAL NERVES: Pupils are large 6 mm but reactive. Initially pupils were deviated downwards. After the patient woke up, he has full extraocular movements. Coronary reflexes are intact. The patient seemed to not to respond to direct threat bilaterally suggestive of possible visual field impairment. Facial muscle strength symmetric.  MOTOR: Bulk and tone are good. The patient moves both  sides vigorously.  COORDINATION: No tremors, dystonia or dysmetria noted. No rigidity, bradykinesia or parkinsonism observed.   SENSATION: Normal to deep painful stimuli.      Objective: Vital signs in last 24 hours: Temp:  [97 F (36.1 C)-98.9 F (37.2 C)] 98.2 F (36.8 C) (02/03 0400) Pulse Rate:  [76-98] 86 (02/03 0600) Resp:  [11-28] 16 (02/03 0600) BP: (99-184)/(52-95) 147/71 mmHg (02/03 0600) SpO2:  [94 %-100 %] 95 % (02/03 0600)  Intake/Output from previous day: 02/02 0701 - 02/03 0700 In: 3795 [P.O.:720; I.V.:2875; IV Piggyback:200] Out: 1350 [Urine:1350] Intake/Output this shift:   Nutritional status: Diet Carb Modified   Lab Results: Results for orders placed or performed during the hospital encounter of 09/25/14 (from the past 48 hour(s))  Glucose, capillary     Status: Abnormal   Collection Time: 10/05/14  7:56 AM  Result Value Ref Range   Glucose-Capillary 120 (H) 70 - 99 mg/dL   Comment 1 Notify RN   Valproic acid level     Status: Abnormal   Collection Time: 10/05/14 11:30 AM  Result Value Ref Range   Valproic Acid Lvl 111.5 (H) 50.0 - 100.0 ug/mL  Glucose, capillary     Status: Abnormal   Collection Time: 10/05/14 12:01 PM  Result Value Ref Range   Glucose-Capillary 149 (H) 70 - 99 mg/dL   Comment 1 Notify RN   Glucose, capillary     Status: Abnormal   Collection Time: 10/05/14  4:04 PM  Result Value Ref Range   Glucose-Capillary 139 (H) 70 - 99 mg/dL   Comment 1 Notify RN  Glucose, capillary     Status: Abnormal   Collection Time: 10/05/14  9:08 PM  Result Value Ref Range   Glucose-Capillary 163 (H) 70 - 99 mg/dL   Comment 1 Notify RN   Basic metabolic panel     Status: Abnormal   Collection Time: 10/06/14  6:15 AM  Result Value Ref Range   Sodium 141 135 - 145 mmol/L   Potassium 3.3 (L) 3.5 - 5.1 mmol/L   Chloride 106 96 - 112 mmol/L   CO2 30 19 - 32 mmol/L   Glucose, Bld 175 (H) 70 - 99 mg/dL   BUN 35 (H) 6 - 23 mg/dL   Creatinine,  Ser 1.44 (H) 0.50 - 1.35 mg/dL   Calcium 7.9 (L) 8.4 - 10.5 mg/dL   GFR calc non Af Amer 53 (L) >90 mL/min   GFR calc Af Amer 61 (L) >90 mL/min    Comment: (NOTE) The eGFR has been calculated using the CKD EPI equation. This calculation has not been validated in all clinical situations. eGFR's persistently <90 mL/min signify possible Chronic Kidney Disease.    Anion gap 5 5 - 15  CBC     Status: Abnormal   Collection Time: 10/06/14  6:15 AM  Result Value Ref Range   WBC 3.0 (L) 4.0 - 10.5 K/uL   RBC 3.52 (L) 4.22 - 5.81 MIL/uL   Hemoglobin 10.6 (L) 13.0 - 17.0 g/dL   HCT 31.9 (L) 39.0 - 52.0 %   MCV 90.6 78.0 - 100.0 fL   MCH 30.1 26.0 - 34.0 pg   MCHC 33.2 30.0 - 36.0 g/dL   RDW 13.1 11.5 - 15.5 %   Platelets 34 (L) 150 - 400 K/uL    Comment: SPECIMEN CHECKED FOR CLOTS PLATELET COUNT CONFIRMED BY SMEAR   Culture, blood (routine x 2)     Status: None (Preliminary result)   Collection Time: 10/06/14  6:15 AM  Result Value Ref Range   Specimen Description Blood    Special Requests Normal    Culture NO GROWTH <24 HRS    Report Status PENDING   Culture, blood (routine x 2)     Status: None (Preliminary result)   Collection Time: 10/06/14  6:15 AM  Result Value Ref Range   Specimen Description Blood    Special Requests Normal    Culture NO GROWTH <24 HRS    Report Status PENDING   Glucose, capillary     Status: Abnormal   Collection Time: 10/06/14  7:53 AM  Result Value Ref Range   Glucose-Capillary 167 (H) 70 - 99 mg/dL  Glucose, capillary     Status: Abnormal   Collection Time: 10/06/14 11:53 AM  Result Value Ref Range   Glucose-Capillary 243 (H) 70 - 99 mg/dL   Comment 1 Notify RN   Glucose, capillary     Status: Abnormal   Collection Time: 10/06/14  4:32 PM  Result Value Ref Range   Glucose-Capillary 234 (H) 70 - 99 mg/dL   Comment 1 Notify RN   Glucose, capillary     Status: Abnormal   Collection Time: 10/06/14  9:59 PM  Result Value Ref Range    Glucose-Capillary 182 (H) 70 - 99 mg/dL  Basic metabolic panel     Status: Abnormal   Collection Time: 10/07/14  4:57 AM  Result Value Ref Range   Sodium 135 135 - 145 mmol/L   Potassium 3.2 (L) 3.5 - 5.1 mmol/L   Chloride 104 96 - 112 mmol/L  CO2 26 19 - 32 mmol/L   Glucose, Bld 180 (H) 70 - 99 mg/dL   BUN 35 (H) 6 - 23 mg/dL   Creatinine, Ser 1.28 0.50 - 1.35 mg/dL   Calcium 7.4 (L) 8.4 - 10.5 mg/dL   GFR calc non Af Amer 61 (L) >90 mL/min   GFR calc Af Amer 70 (L) >90 mL/min    Comment: (NOTE) The eGFR has been calculated using the CKD EPI equation. This calculation has not been validated in all clinical situations. eGFR's persistently <90 mL/min signify possible Chronic Kidney Disease.    Anion gap 5 5 - 15  CBC with Differential     Status: Abnormal   Collection Time: 10/07/14  4:57 AM  Result Value Ref Range   WBC 3.7 (L) 4.0 - 10.5 K/uL   RBC 3.37 (L) 4.22 - 5.81 MIL/uL   Hemoglobin 9.9 (L) 13.0 - 17.0 g/dL   HCT 30.3 (L) 39.0 - 52.0 %   MCV 89.9 78.0 - 100.0 fL   MCH 29.4 26.0 - 34.0 pg   MCHC 32.7 30.0 - 36.0 g/dL   RDW 12.9 11.5 - 15.5 %   Platelets 31 (L) 150 - 400 K/uL    Comment: SPECIMEN CHECKED FOR CLOTS CONSISTENT WITH PREVIOUS RESULT    Neutrophils Relative % 69 43 - 77 %   Neutro Abs 2.6 1.7 - 7.7 K/uL   Lymphocytes Relative 19 12 - 46 %   Lymphs Abs 0.7 0.7 - 4.0 K/uL   Monocytes Relative 12 3 - 12 %   Monocytes Absolute 0.5 0.1 - 1.0 K/uL   Eosinophils Relative 1 0 - 5 %   Eosinophils Absolute 0.0 0.0 - 0.7 K/uL   Basophils Relative 0 0 - 1 %   Basophils Absolute 0.0 0.0 - 0.1 K/uL    Lipid Panel No results for input(s): CHOL, TRIG, HDL, CHOLHDL, VLDL, LDLCALC in the last 72 hours.  Studies/Results:   Medications:  Scheduled Meds: . antiseptic oral rinse  7 mL Mouth Rinse BID  . enoxaparin (LOVENOX) injection  30 mg Subcutaneous Q12H  . feeding supplement (ENSURE COMPLETE)  237 mL Oral QHS  . haloperidol  5 mg Oral TID  . insulin aspart   0-9 Units Subcutaneous TID WC & HS  . QUEtiapine  200 mg Oral BID  . simvastatin  20 mg Oral QHS  . sodium chloride  3 mL Intravenous Q12H  . valproic acid  500 mg Oral TID  . vancomycin  1,000 mg Intravenous Q24H   Continuous Infusions: . dextrose 5 % and 0.45% NaCl 125 mL/hr at 10/07/14 0600   PRN Meds:.acetaminophen **OR** acetaminophen, chlorproMAZINE (THORAZINE) injection, haloperidol lactate, labetalol     LOS: 12 days   Caylea Foronda A. [name], M.D.  Diplomate, Tax adviser of Psychiatry and Neurology ( Neurology).

## 2014-10-07 NOTE — Progress Notes (Addendum)
    This is not a formal infectious disease consultation but rather informal recommendations given that the patient was found to have MRSA bacteremia.  ID; Briefly, Derrick Butler is a 59 year old male with history of bipolar d/o and prior CVA (with resultant cognitive deficits) admitted with ARF/hyperkalemia and worsening AMS. He was found to be febrile to 102.14F on 1/30. Due to concern for infection, he was started on vancomycin and piptazo. Infectious work up revealed 2 sets of  blood cultures + MRSA.  antibiotics narrowed to vancomycin and his fever curve has trended downward.He was noted to have cellulitis in the left wrist IV site. LUE ultrasound showed thrombus in the superficial venous system of the left arm but also a non-occlusive thrombus in the Left Internal jugular vein. No other source of MRSA infection has been isolated.       [facility] Staphylococcus aureus bacteremia   Staphylococcus aureus bacteremia (SAB) is associated with a high rate of complications and mortality.  Specific aspects of clinical management are critical to optimizing the outcome of patients with SAB.  Therefore, the [facility] Antimicrobial Management Team [facility]) has initiated an intervention aimed at improving the management of SAB at [facility].  To do so, Infectious Diseases physicians are providing an evidence-based consult for the management of all patients with SAB.     Yes No Comments  Perform follow-up blood cultures (even if the patient is afebrile) to ensure clearance of bacteremia [x]  []  Repeat cx drawn on 10/06/14. Results still pending; do not place picc line until bacteremia is cleared       Perform echocardiography to evaluate for endocarditis (transthoracic ECHO is 40-50% sensitive, TEE is > 90% sensitive) []  [x]  Awaiting results of TEE to determine if he has SBE            Investigate for "metastatic" sites of infection []  [x]  Does the patient have ANY symptom or  physical exam finding that would suggest a deeper infection (back or neck pain that may be suggestive of vertebral osteomyelitis or epidural abscess, muscle pain that could be a symptom of pyomyositis)?  Keep in mind that for deep seeded infections MRI imaging with contrast is preferred rather than other often insensitive tests such as plain x-rays, especially early in a patient's presentation.  Change antibiotic therapy to ____vancomycin____ [x]  []   goal trough should be 15 - 20 mcg/mL  Estimated duration of IV antibiotic therapy:  6 wk, for presumed septic thrombosis [x]  []  Consult case management for probably prolonged outpatient IV antibiotic therapy   Please call if questions.  Elzie Rings [place] for Infectious Diseases [phone]

## 2014-10-07 NOTE — Progress Notes (Signed)
Subjective: Patient is not sedated, but he is lethargic.  He is not combative with me.  He is uncooperative with commands.    Gross hematuria noted.  Nurse tech reports it has been dark all day.  She notes that he has fallen twice and she suspects he has been manipulating catheter given his poorly controlled movements.  Objective: Vital signs in last 24 hours: Temp:  [98.2 F (36.8 C)-99.1 F (37.3 C)] 99.1 F (37.3 C) (02/03 1130) Pulse Rate:  [76-102] 95 (02/03 1500) Resp:  [15-41] 28 (02/03 1600) BP: (99-184)/(55-84) 127/55 mmHg (02/03 1600) SpO2:  [91 %-100 %] 97 % (02/03 1500)  Intake/Output from previous day: 02/02 0800 - 02/03 0759 In: 8676 [P.O.:720; I.V.:3000; IV Piggyback:200] Out: 1350 [Urine:1350] Intake/Output this shift: Total I/O In: 450 [I.V.:250; IV Piggyback:200] Out: -   General appearance: alert, appears older than stated age, no distress, slowed mentation and uncooperative Resp: not examined Cardio: not examined GI: soft, non-tender; bowel sounds normal; no masses,  no organomegaly Extremities: extremities normal, atraumatic, no cyanosis or edema and no petechia  Foley: Dark red urine.  Oropharynx: patient refuses to open mouth.  Lab Results:   Recent Labs  10/06/14 0615 10/07/14 0457  WBC 3.0* 3.7*  HGB 10.6* 9.9*  HCT 31.9* 30.3*  PLT 34* 31*   BMET  Recent Labs  10/06/14 0615 10/07/14 0457  NA 141 135  K 3.3* 3.2*  CL 106 104  CO2 30 26  GLUCOSE 175* 180*  BUN 35* 35*  CREATININE 1.44* 1.28  CALCIUM 7.9* 7.4*    Studies/Results: No results found.  Medications: I have reviewed the patient's current medications.  Assessment/Plan: 1. Thrombocytopenia, stable compared to yesterday.   2. Left internal jugular non-occlusive thrombus (acute versus subacute).  Started on 1/2 dose dose Lovenox BID on 10/06/2014 for anticoagulation.  Will discontinue anticoagulation due to #5.  No anticoagulation recommended at this time. 3. Progressive  anemia.  Etiology unknown, but gross hematuria is noted today on exam and may be the cause of progressive anemia.  Will perform anemia panel today.  Stool cards x 3 to evaluate for GI bleed.  UA with reflex today. 4. Pancytopenia.  If counts do not improve soon, he would be a candidate for bone marrow aspiration and biopsy, but utility of that test is questionable as he is not a candidate for aggressive intervention for dysplastic syndromes, except possibly 5Q- MDS.  Will monitor labs closely. 5. Gross hematuria.  Will order UA with reflex.  Will order CBC now.  D/C Lovenox.  Patient and plan discussed with Dr. Ancil Linsey and she is in agreement with the aforementioned.     LOS: 12 days    KEFALAS,THOMAS 10/07/2014 Pager: [phone]   Agree with above. Thrombocytopenia most likely from meds/infection.  Because of bleeding would not anticoagulate at this point until platelet count improves. Would continue to monitor CBC's. Will f/u in am. Molli Hazard, MD

## 2014-10-07 NOTE — Progress Notes (Signed)
Subjective: Patient is opening his  Eyes and trying to  Respond. He remained acutely sick and sedated. Objective: Vital signs in last 24 hours: Temp:  [97 F (36.1 C)-98.9 F (37.2 C)] 98.2 F (36.8 C) (02/03 0400) Pulse Rate:  [76-98] 86 (02/03 0600) Resp:  [11-28] 16 (02/03 0600) BP: (99-184)/(52-95) 147/71 mmHg (02/03 0600) SpO2:  [94 %-100 %] 95 % (02/03 0600) Weight change:  Last BM Date: 10/06/14  Intake/Output from previous day: 02/02 0701 - 02/03 0700 In: 3795 [P.O.:720; I.V.:2875; IV Piggyback:200] Out: 1350 [Urine:1350]  PHYSICAL EXAM General appearance: no distress and slowed mentation Resp: diminished breath sounds bilaterally and rhonchi bilaterally Cardio: S1, S2 normal GI: soft, non-tender; bowel sounds normal; no masses,  no organomegaly Extremities: extremities normal, atraumatic, no cyanosis or edema  Lab Results:  Results for orders placed or performed during the hospital encounter of 09/25/14 (from the past 48 hour(s))  Valproic acid level     Status: Abnormal   Collection Time: 10/05/14 11:30 AM  Result Value Ref Range   Valproic Acid Lvl 111.5 (H) 50.0 - 100.0 ug/mL  Glucose, capillary     Status: Abnormal   Collection Time: 10/05/14 12:01 PM  Result Value Ref Range   Glucose-Capillary 149 (H) 70 - 99 mg/dL   Comment 1 Notify RN   Glucose, capillary     Status: Abnormal   Collection Time: 10/05/14  4:04 PM  Result Value Ref Range   Glucose-Capillary 139 (H) 70 - 99 mg/dL   Comment 1 Notify RN   Glucose, capillary     Status: Abnormal   Collection Time: 10/05/14  9:08 PM  Result Value Ref Range   Glucose-Capillary 163 (H) 70 - 99 mg/dL   Comment 1 Notify RN   Basic metabolic panel     Status: Abnormal   Collection Time: 10/06/14  6:15 AM  Result Value Ref Range   Sodium 141 135 - 145 mmol/L   Potassium 3.3 (L) 3.5 - 5.1 mmol/L   Chloride 106 96 - 112 mmol/L   CO2 30 19 - 32 mmol/L   Glucose, Bld 175 (H) 70 - 99 mg/dL   BUN 35 (H) 6 - 23  mg/dL   Creatinine, Ser 1.44 (H) 0.50 - 1.35 mg/dL   Calcium 7.9 (L) 8.4 - 10.5 mg/dL   GFR calc non Af Amer 53 (L) >90 mL/min   GFR calc Af Amer 61 (L) >90 mL/min    Comment: (NOTE) The eGFR has been calculated using the CKD EPI equation. This calculation has not been validated in all clinical situations. eGFR's persistently <90 mL/min signify possible Chronic Kidney Disease.    Anion gap 5 5 - 15  CBC     Status: Abnormal   Collection Time: 10/06/14  6:15 AM  Result Value Ref Range   WBC 3.0 (L) 4.0 - 10.5 K/uL   RBC 3.52 (L) 4.22 - 5.81 MIL/uL   Hemoglobin 10.6 (L) 13.0 - 17.0 g/dL   HCT 31.9 (L) 39.0 - 52.0 %   MCV 90.6 78.0 - 100.0 fL   MCH 30.1 26.0 - 34.0 pg   MCHC 33.2 30.0 - 36.0 g/dL   RDW 13.1 11.5 - 15.5 %   Platelets 34 (L) 150 - 400 K/uL    Comment: SPECIMEN CHECKED FOR CLOTS PLATELET COUNT CONFIRMED BY SMEAR   Culture, blood (routine x 2)     Status: None (Preliminary result)   Collection Time: 10/06/14  6:15 AM  Result Value Ref Range  Specimen Description Blood    Special Requests Normal    Culture NO GROWTH <24 HRS    Report Status PENDING   Culture, blood (routine x 2)     Status: None (Preliminary result)   Collection Time: 10/06/14  6:15 AM  Result Value Ref Range   Specimen Description Blood    Special Requests Normal    Culture NO GROWTH <24 HRS    Report Status PENDING   Glucose, capillary     Status: Abnormal   Collection Time: 10/06/14  7:53 AM  Result Value Ref Range   Glucose-Capillary 167 (H) 70 - 99 mg/dL  Glucose, capillary     Status: Abnormal   Collection Time: 10/06/14 11:53 AM  Result Value Ref Range   Glucose-Capillary 243 (H) 70 - 99 mg/dL   Comment 1 Notify RN   Glucose, capillary     Status: Abnormal   Collection Time: 10/06/14  4:32 PM  Result Value Ref Range   Glucose-Capillary 234 (H) 70 - 99 mg/dL   Comment 1 Notify RN   Glucose, capillary     Status: Abnormal   Collection Time: 10/06/14  9:59 PM  Result Value Ref  Range   Glucose-Capillary 182 (H) 70 - 99 mg/dL  Basic metabolic panel     Status: Abnormal   Collection Time: 10/07/14  4:57 AM  Result Value Ref Range   Sodium 135 135 - 145 mmol/L   Potassium 3.2 (L) 3.5 - 5.1 mmol/L   Chloride 104 96 - 112 mmol/L   CO2 26 19 - 32 mmol/L   Glucose, Bld 180 (H) 70 - 99 mg/dL   BUN 35 (H) 6 - 23 mg/dL   Creatinine, Ser 1.28 0.50 - 1.35 mg/dL   Calcium 7.4 (L) 8.4 - 10.5 mg/dL   GFR calc non Af Amer 61 (L) >90 mL/min   GFR calc Af Amer 70 (L) >90 mL/min    Comment: (NOTE) The eGFR has been calculated using the CKD EPI equation. This calculation has not been validated in all clinical situations. eGFR's persistently <90 mL/min signify possible Chronic Kidney Disease.    Anion gap 5 5 - 15  CBC with Differential     Status: Abnormal   Collection Time: 10/07/14  4:57 AM  Result Value Ref Range   WBC 3.7 (L) 4.0 - 10.5 K/uL   RBC 3.37 (L) 4.22 - 5.81 MIL/uL   Hemoglobin 9.9 (L) 13.0 - 17.0 g/dL   HCT 30.3 (L) 39.0 - 52.0 %   MCV 89.9 78.0 - 100.0 fL   MCH 29.4 26.0 - 34.0 pg   MCHC 32.7 30.0 - 36.0 g/dL   RDW 12.9 11.5 - 15.5 %   Platelets 31 (L) 150 - 400 K/uL    Comment: SPECIMEN CHECKED FOR CLOTS CONSISTENT WITH PREVIOUS RESULT    Neutrophils Relative % 69 43 - 77 %   Neutro Abs 2.6 1.7 - 7.7 K/uL   Lymphocytes Relative 19 12 - 46 %   Lymphs Abs 0.7 0.7 - 4.0 K/uL   Monocytes Relative 12 3 - 12 %   Monocytes Absolute 0.5 0.1 - 1.0 K/uL   Eosinophils Relative 1 0 - 5 %   Eosinophils Absolute 0.0 0.0 - 0.7 K/uL   Basophils Relative 0 0 - 1 %   Basophils Absolute 0.0 0.0 - 0.1 K/uL    ABGS No results for input(s): PHART, PO2ART, TCO2, HCO3 in the last 72 hours.  Invalid input(s): PCO2 CULTURES Recent Results (  from the past 240 hour(s))  Culture, blood (routine x 2)     Status: None   Collection Time: 10/03/14  9:58 AM  Result Value Ref Range Status   Specimen Description BLOOD RIGHT ANTECUBITAL  Final   Special Requests BOTTLES  DRAWN AEROBIC AND ANAEROBIC 6CC  Final   Culture   Final    STAPHYLOCOCCUS AUREUS Note: Gram Stain Report Called to,Read Back By and Verified With: LEE B _0  ON 10/04/14 BY WOODIE J SUSCEPTIBILITIES PERFORMED ON PREVIOUS CULTURE WITHIN THE LAST 5 DAYS. Performed at [facility]    Report Status 10/06/2014 FINAL  Final  Culture, blood (routine x 2)     Status: None   Collection Time: 10/03/14  9:59 AM  Result Value Ref Range Status   Specimen Description BLOOD RIGHT ARM  Final   Special Requests BOTTLES DRAWN AEROBIC AND ANAEROBIC 8CC  Final   Culture   Final    METHICILLIN RESISTANT STAPHYLOCOCCUS AUREUS Note: Gram Stain Report Called to,Read Back By and Verified With: LEE B @ 0330 ON 10/04/14 BY WOODIEB RIFAMPIN AND GENTAMICIN SHOULD NOT BE USED AS SINGLE DRUGS FOR TREATMENT OF STAPH INFECTIONS. This organism DOES NOT demonstrate inducible Clindamycin  resistance in vitro. CRITICAL RESULT CALLED TO, READ BACK BY AND VERIFIED WITH: Waynard Reeds RN AT [facility] AT [phone] BY CASTC Performed at [facility]    Report Status 10/06/2014 FINAL  Final   Organism ID, Bacteria METHICILLIN RESISTANT STAPHYLOCOCCUS AUREUS  Final      Susceptibility   Methicillin resistant staphylococcus aureus - MIC*    CLINDAMYCIN <=0.25 SENSITIVE Sensitive     ERYTHROMYCIN >=8 RESISTANT Resistant     GENTAMICIN <=0.5 SENSITIVE Sensitive     LEVOFLOXACIN 4 INTERMEDIATE Intermediate     OXACILLIN >=4 RESISTANT Resistant     PENICILLIN >=0.5 RESISTANT Resistant     RIFAMPIN <=0.5 SENSITIVE Sensitive     TRIMETH/SULFA <=10 SENSITIVE Sensitive     VANCOMYCIN <=0.5 SENSITIVE Sensitive     TETRACYCLINE <=1 SENSITIVE Sensitive     * METHICILLIN RESISTANT STAPHYLOCOCCUS AUREUS  Culture, Urine     Status: None   Collection Time: 10/03/14 10:00 AM  Result Value Ref Range Status   Specimen Description URINE, CATHETERIZED  Final   Special Requests NONE  Final   Colony Count NO GROWTH Performed at  [facility]   Final   Culture NO GROWTH Performed at [facility]   Final   Report Status 10/04/2014 FINAL  Final  Culture, blood (routine x 2)     Status: None (Preliminary result)   Collection Time: 10/06/14  6:15 AM  Result Value Ref Range Status   Specimen Description Blood  Final   Special Requests Normal  Final   Culture NO GROWTH <24 HRS  Final   Report Status PENDING  Incomplete  Culture, blood (routine x 2)     Status: None (Preliminary result)   Collection Time: 10/06/14  6:15 AM  Result Value Ref Range Status   Specimen Description Blood  Final   Special Requests Normal  Final   Culture NO GROWTH <24 HRS  Final   Report Status PENDING  Incomplete   Studies/Results: US Venous Img Upper Uni Left  10/05/2014   CLINICAL DATA:  Left upper extremity pain, redness and purulent drainage at site of prior IV infiltration. Evaluate for DVT.  EXAM: LEFT UPPER EXTREMITY VENOUS DOPPLER ULTRASOUND  TECHNIQUE: Gray-scale sonography with graded compression, as well as  color Doppler and duplex ultrasound were performed to evaluate the upper extremity deep venous system from the level of the subclavian vein and including the jugular, axillary, basilic, radial, ulnar and upper cephalic vein. Spectral Doppler was utilized to evaluate flow at rest and with distal augmentation maneuvers.  COMPARISON:  None.  FINDINGS: Contralateral Subclavian Vein: Respiratory phasicity is normal and symmetric with the symptomatic side. No evidence of thrombus. Normal compressibility.  Internal Jugular Vein: Nonocclusive thrombus in the left jugular vein. The vessel is partially compressible.  Subclavian Vein: No evidence of thrombus. Normal compressibility, respiratory phasicity and response to augmentation.  Axillary Vein: No evidence of thrombus. Normal compressibility, respiratory phasicity and response to augmentation.  Cephalic Vein: Echogenic material fills the cephalic vein at the elbow. The vessel  is noncompressible. This is consistent with occlusive superficial thrombosis. Thrombus extends up the arm almost to the shoulder.  Basilic Vein: Segment of incompressible vessel just below the elbow. Echogenic material fills the lumen of the vein consistent with thrombus.  Brachial Veins: No evidence of thrombus. Normal compressibility, respiratory phasicity and response to augmentation.  Radial Veins: No evidence of thrombus. Normal compressibility, respiratory phasicity and response to augmentation.  Ulnar Veins: No evidence of thrombus. Normal compressibility, respiratory phasicity and response to augmentation.  Venous Reflux:  None visualized.  Other Findings: Extensive subcutaneous edema in the lower arm and forearm.  IMPRESSION: 1. Nonocclusive thrombus in the left jugular vein. This may be acute, or subacute. Has the patient had a recent left IJ approach central line? 2. Positive for superficial venous thrombosis involving the cephalic vein beginning at the elbow and extending up the arm and nearly to the shoulder and within a short segment of the basilic vein just below the elbow. No definite propagation into the deep brachial or axillary veins. 3. Extensive subcutaneous edema in the lower arm and forearm.   Electronically Signed   By: Jacqulynn Cadet M.D.   On: 10/05/2014 16:33    Medications: I have reviewed the patient's current medications.  Assesment:   Principal Problem:   Hyperkalemia Active Problems:   Bipolar I disorder, most recent episode mixed   H/O: stroke   Dehydration   CVA, old, cognitive deficits   CKD (chronic kidney disease) stage 3, GFR 30-59 ml/min   Thrombocytopenia   Acute encephalopathy   Acute renal failure   Hypoglycemia MRSA bacteremia Left jagular thrombus thrombocytobenia  Plan:  Medications reviewed Will increase IV fluid to 125 ml/hr. Continue Iv vancomycin  hematology consult appreciated will continue anticoagulation as recommended, however his  platelet is dropping Will monitor CBC/BMP   LOS: 12 days   Derrick Butler 10/07/2014, 8:04 AM

## 2014-10-08 ENCOUNTER — Other Ambulatory Visit (HOSPITAL_COMMUNITY): Payer: Self-pay

## 2014-10-08 DIAGNOSIS — R7881 Bacteremia: Secondary | ICD-10-CM

## 2014-10-08 DIAGNOSIS — R310 Gross hematuria: Secondary | ICD-10-CM

## 2014-10-08 DIAGNOSIS — D61818 Other pancytopenia: Secondary | ICD-10-CM

## 2014-10-08 DIAGNOSIS — D649 Anemia, unspecified: Secondary | ICD-10-CM

## 2014-10-08 LAB — CBC
HCT: 27.7 % — ABNORMAL LOW (ref 39.0–52.0)
HEMOGLOBIN: 9.4 g/dL — AB (ref 13.0–17.0)
MCH: 29.8 pg (ref 26.0–34.0)
MCHC: 33.9 g/dL (ref 30.0–36.0)
MCV: 87.9 fL (ref 78.0–100.0)
PLATELETS: 27 10*3/uL — AB (ref 150–400)
RBC: 3.15 MIL/uL — ABNORMAL LOW (ref 4.22–5.81)
RDW: 12.7 % (ref 11.5–15.5)
WBC: 7.3 10*3/uL (ref 4.0–10.5)

## 2014-10-08 LAB — BASIC METABOLIC PANEL
Anion gap: 4 — ABNORMAL LOW (ref 5–15)
BUN: 34 mg/dL — ABNORMAL HIGH (ref 6–23)
CO2: 27 mmol/L (ref 19–32)
Calcium: 7.7 mg/dL — ABNORMAL LOW (ref 8.4–10.5)
Chloride: 107 mmol/L (ref 96–112)
Creatinine, Ser: 1.22 mg/dL (ref 0.50–1.35)
GFR calc Af Amer: 74 mL/min — ABNORMAL LOW (ref >90)
GFR calc non Af Amer: 64 mL/min — ABNORMAL LOW (ref >90)
Glucose, Bld: 191 mg/dL — ABNORMAL HIGH (ref 70–99)
Potassium: 3.1 mmol/L — ABNORMAL LOW (ref 3.5–5.1)
Sodium: 138 mmol/L (ref 135–145)

## 2014-10-08 LAB — CBC WITH DIFFERENTIAL/PLATELET
BASOS ABS: 0 10*3/uL (ref 0.0–0.1)
Basophils Relative: 0 % (ref 0–1)
EOS ABS: 0 10*3/uL (ref 0.0–0.7)
Eosinophils Relative: 1 % (ref 0–5)
HEMATOCRIT: 29.6 % — AB (ref 39.0–52.0)
HEMOGLOBIN: 10 g/dL — AB (ref 13.0–17.0)
Lymphocytes Relative: 13 % (ref 12–46)
Lymphs Abs: 0.8 10*3/uL (ref 0.7–4.0)
MCH: 29.7 pg (ref 26.0–34.0)
MCHC: 33.8 g/dL (ref 30.0–36.0)
MCV: 87.8 fL (ref 78.0–100.0)
MONOS PCT: 9 % (ref 3–12)
Monocytes Absolute: 0.6 10*3/uL (ref 0.1–1.0)
NEUTROS ABS: 4.8 10*3/uL (ref 1.7–7.7)
Neutrophils Relative %: 77 % (ref 43–77)
PLATELETS: 24 10*3/uL — AB (ref 150–400)
RBC: 3.37 MIL/uL — AB (ref 4.22–5.81)
RDW: 12.7 % (ref 11.5–15.5)
WBC: 6.2 10*3/uL (ref 4.0–10.5)

## 2014-10-08 LAB — LACTATE DEHYDROGENASE: LDH: 332 U/L — ABNORMAL HIGH (ref 94–250)

## 2014-10-08 LAB — GLUCOSE, CAPILLARY
GLUCOSE-CAPILLARY: 186 mg/dL — AB (ref 70–99)
Glucose-Capillary: 152 mg/dL — ABNORMAL HIGH (ref 70–99)
Glucose-Capillary: 167 mg/dL — ABNORMAL HIGH (ref 70–99)
Glucose-Capillary: 99 mg/dL (ref 70–99)

## 2014-10-08 LAB — FERRITIN: Ferritin: 849 ng/mL — ABNORMAL HIGH (ref 22–322)

## 2014-10-08 LAB — VANCOMYCIN, TROUGH: Vancomycin Tr: 10.3 ug/mL (ref 10.0–20.0)

## 2014-10-08 LAB — CK: CK TOTAL: 1152 U/L — AB (ref 7–232)

## 2014-10-08 LAB — VITAMIN B12: Vitamin B-12: 630 pg/mL (ref 211–911)

## 2014-10-08 LAB — FOLATE: Folate: 8.8 ng/mL

## 2014-10-08 MED ORDER — HALOPERIDOL LACTATE 5 MG/ML IJ SOLN
7.5000 mg | Freq: Four times a day (QID) | INTRAMUSCULAR | Status: DC
  Administered 2014-10-09 (×4): 7.5 mg via INTRAMUSCULAR
  Filled 2014-10-08 (×5): qty 2

## 2014-10-08 MED ORDER — HALOPERIDOL LACTATE 5 MG/ML IJ SOLN: 7.5000 mg | Freq: Three times a day (TID) | INTRAMUSCULAR | Status: DC

## 2014-10-08 MED ORDER — VANCOMYCIN HCL 10 G IV SOLR
1500.0000 mg | INTRAVENOUS | Status: DC
  Filled 2014-10-08: qty 1500

## 2014-10-08 MED ORDER — POTASSIUM CHLORIDE 10 MEQ/100ML IV SOLN
10.0000 meq | INTRAVENOUS | Status: AC
  Administered 2014-10-08 (×2): 10 meq via INTRAVENOUS
  Filled 2014-10-08: qty 100

## 2014-10-08 MED ORDER — HALOPERIDOL LACTATE 5 MG/ML IJ SOLN
2.5000 mg | Freq: Once | INTRAMUSCULAR | Status: AC
Start: 2014-10-08 — End: 2014-10-08
  Administered 2014-10-08: 2.5 mg via INTRAMUSCULAR

## 2014-10-08 MED ORDER — VALPROATE SODIUM 500 MG/5ML IV SOLN
INTRAVENOUS | Status: AC
  Filled 2014-10-08: qty 5

## 2014-10-08 MED ORDER — SODIUM CHLORIDE 0.9 % IV SOLN
470.0000 mg | INTRAVENOUS | Status: DC
  Administered 2014-10-08: 470 mg via INTRAVENOUS
  Filled 2014-10-08: qty 9.4

## 2014-10-08 MED ORDER — CHLORPROMAZINE HCL 25 MG/ML IJ SOLN
INTRAMUSCULAR | Status: AC
Start: 2014-10-08 — End: 2014-10-08
  Filled 2014-10-08: qty 2

## 2014-10-08 MED ORDER — VANCOMYCIN HCL 500 MG IV SOLR
500.0000 mg | Freq: Once | INTRAVENOUS | Status: AC
  Administered 2014-10-08: 500 mg via INTRAVENOUS
  Filled 2014-10-08: qty 500

## 2014-10-08 MED ORDER — DEXTROSE 5 % IV SOLN
500.0000 mg | Freq: Two times a day (BID) | INTRAVENOUS | Status: DC
  Administered 2014-10-08 – 2014-10-12 (×9): 500 mg via INTRAVENOUS
  Filled 2014-10-08 (×10): qty 5

## 2014-10-08 NOTE — Progress Notes (Signed)
After multiple attempts EEG could not be completed due to patient moving and pulling electrodes off. Nurse and sitter aware.

## 2014-10-08 NOTE — Progress Notes (Signed)
ANTIBIOTIC CONSULT NOTE -   Pharmacy Consult for Vancomycin Indication: bacteremia  No Known Allergies  Patient Measurements: Height: 5\' 9"  (175.3 cm) Weight: 130 lb 1.1 oz (59 kg) IBW/kg (Calculated) : 70.7  Vital Signs: Temp: 98.6 F (37 C) (02/04 0730) Temp Source: Axillary (02/04 0730) BP: 144/109 mmHg (02/04 0800) Pulse Rate: 109 (02/04 1000) Intake/Output from previous day: 02/03 0701 - 02/04 0700 In: 3915 [P.O.:240; I.V.:3000; IV Piggyback:200] Out: 100 [Urine:100] Intake/Output from this shift: Total I/O In: 575 [I.V.:375; IV Piggyback:200] Out: -   Labs:  Recent Labs  10/06/14 0615 10/07/14 0457 10/07/14 1734 10/08/14 0740 10/08/14 0745  WBC 3.0* 3.7* 5.8 6.2  --   HGB 10.6* 9.9* 10.4* 10.0*  --   PLT 34* 31* 28* 24*  --   CREATININE 1.44* 1.28  --   --  1.22   Estimated Creatinine Clearance: 55.7 mL/min (by C-G formula based on Cr of 1.22).  Recent Labs  10/08/14 0743  [place] 10.3    Microbiology: Recent Results (from the past 720 hour(s))  MRSA PCR Screening     Status: Abnormal   Collection Time: 09/25/14  2:57 PM  Result Value Ref Range Status   MRSA by PCR POSITIVE (A) NEGATIVE Final    Comment:        The GeneXpert MRSA Assay (FDA approved for NASAL specimens only), is one component of a comprehensive MRSA colonization surveillance program. It is not intended to diagnose MRSA infection nor to guide or monitor treatment for MRSA infections. RESULT CALLED TO, READ BACK BY AND VERIFIED WITH: NIELSON,T ON 09/25/14 AT 2105 BY LOY,C   Culture, blood (routine x 2)     Status: None   Collection Time: 10/03/14  9:58 AM  Result Value Ref Range Status   Specimen Description BLOOD RIGHT ANTECUBITAL  Final   Special Requests BOTTLES DRAWN AEROBIC AND ANAEROBIC 6CC  Final   Culture   Final    STAPHYLOCOCCUS AUREUS Note: Gram Stain Report Called to,Read Back By and Verified With: LEE B @0330  ON 10/04/14 BY WOODIE J SUSCEPTIBILITIES  PERFORMED ON PREVIOUS CULTURE WITHIN THE LAST 5 DAYS. Performed at [facility]    Report Status 10/06/2014 FINAL  Final  Culture, blood (routine x 2)     Status: None   Collection Time: 10/03/14  9:59 AM  Result Value Ref Range Status   Specimen Description BLOOD RIGHT ARM  Final   Special Requests BOTTLES DRAWN AEROBIC AND ANAEROBIC 8CC  Final   Culture   Final    METHICILLIN RESISTANT STAPHYLOCOCCUS AUREUS Note: Gram Stain Report Called to,Read Back By and Verified With: LEE B @ 0330 ON 10/04/14 BY WOODIEB RIFAMPIN AND GENTAMICIN SHOULD NOT BE USED AS SINGLE DRUGS FOR TREATMENT OF STAPH INFECTIONS. This organism DOES NOT demonstrate inducible Clindamycin  resistance in vitro. CRITICAL RESULT CALLED TO, READ BACK BY AND VERIFIED WITH: Waynard Reeds RN AT [facility] AT [phone] BY CASTC Performed at [facility]    Report Status 10/06/2014 FINAL  Final   Organism ID, Bacteria METHICILLIN RESISTANT STAPHYLOCOCCUS AUREUS  Final      Susceptibility   Methicillin resistant staphylococcus aureus - MIC*    CLINDAMYCIN <=0.25 SENSITIVE Sensitive     ERYTHROMYCIN >=8 RESISTANT Resistant     GENTAMICIN <=0.5 SENSITIVE Sensitive     LEVOFLOXACIN 4 INTERMEDIATE Intermediate     OXACILLIN >=4 RESISTANT Resistant     PENICILLIN >=0.5 RESISTANT Resistant     RIFAMPIN <=0.5 SENSITIVE  Sensitive     TRIMETH/SULFA <=10 SENSITIVE Sensitive     VANCOMYCIN <=0.5 SENSITIVE Sensitive     TETRACYCLINE <=1 SENSITIVE Sensitive     * METHICILLIN RESISTANT STAPHYLOCOCCUS AUREUS  Culture, Urine     Status: None   Collection Time: 10/03/14 10:00 AM  Result Value Ref Range Status   Specimen Description URINE, CATHETERIZED  Final   Special Requests NONE  Final   Colony Count NO GROWTH Performed at [facility]   Final   Culture NO GROWTH Performed at [facility]   Final   Report Status 10/04/2014 FINAL  Final  Culture, blood (routine x 2)     Status: None (Preliminary  result)   Collection Time: 10/06/14  6:15 AM  Result Value Ref Range Status   Specimen Description BLOOD RIGHT HAND  Final   Special Requests BOTTLES DRAWN AEROBIC AND ANAEROBIC 8CC  Final   Culture NO GROWTH 2 DAYS  Final   Report Status PENDING  Incomplete  Culture, blood (routine x 2)     Status: None (Preliminary result)   Collection Time: 10/06/14  6:15 AM  Result Value Ref Range Status   Specimen Description BLOOD LEFT HAND  Final   Special Requests BOTTLES DRAWN AEROBIC ONLY 8CC  Final   Culture NO GROWTH 2 DAYS  Final   Report Status PENDING  Incomplete   Medical History: Past Medical History  Diagnosis Date  . Aortic dissection   . Hypertension   . CVA (cerebral infarction)   . Aortic dissection   . Hypertension   . CVA (cerebral infarction)   . Diabetes mellitus   . Cardiomyopathy   . Hyperglycemia   . Bipolar 1 disorder   . Hyperkalemia   . Coronary artery disease   . Hyponatremia   . Altered mental status     with psychosis  . Dehydration   . TIA (transient ischemic attack)   . Lack of coordination   . Cardiomyopathy    Anti-infectives    Start     Dose/Rate Route Frequency Ordered Stop   10/06/14 0900  vancomycin (VANCOCIN) IVPB 1000 mg/200 mL premix     1,000 mg200 mL/hr over 60 Minutes Intravenous Every 24 hours 10/05/14 1105     10/05/14 1500  ceFAZolin (ANCEF) IVPB 2 g/50 mL premix  Status:  Discontinued     2 g100 mL/hr over 30 Minutes Intravenous 3 times per day 10/05/14 1413 10/06/14 0802   10/04/14 1800  vancomycin (VANCOCIN) IVPB 750 mg/150 ml premix  Status:  Discontinued     750 mg150 mL/hr over 60 Minutes Intravenous Every 12 hours 10/04/14 1005 10/05/14 1104   10/04/14 1300  piperacillin-tazobactam (ZOSYN) IVPB 3.375 g  Status:  Discontinued     3.375 g12.5 mL/hr over 240 Minutes Intravenous Every 8 hours 10/04/14 1003 10/05/14 1411   10/04/14 1100  vancomycin (VANCOCIN) 500 mg in sodium chloride 0.9 % 100 mL IVPB     500 mg100 mL/hr over 60  Minutes Intravenous NOW 10/04/14 1007 10/04/14 1201   10/04/14 0345  vancomycin (VANCOCIN) 500 mg in sodium chloride 0.9 % 100 mL IVPB     500 mg100 mL/hr over 60 Minutes Intravenous  Once 10/04/14 0335 10/04/14 0527   10/04/14 0345  piperacillin-tazobactam (ZOSYN) IVPB 3.375 g     3.375 g12.5 mL/hr over 240 Minutes Intravenous  Once 10/04/14 0337 10/04/14 0826     Assessment: 59 yoM with MRSA bacteremia likely from infected line.  Repeat blood  cx pending.  He has remained afebrile for past 24hrs.  Vancomycin trough level is below goal. Acute on chronic renal insufficieny on admission noted.  Since admission Scr has fluctuated 1.21-1.76.  24hr I/O +3832ml  Vancomycin 1/31>>  Pharmacokinetic dosing service    Date:     Time:   Vancomycin single level analysis: Current dose being given: 1000 mg Current dosing interval:  24 hrs  Single level Trough Data:  Trough level obtained: 10.3 mcg/ml Timing of trough - Number of hours since last dose:  22.5 Hrs Desired peak:  40 mcg/ml  Desired trough: 17 mcg/ml  Estimated PK Parameters: --------------------------- New rate constant (kel): 0.051 hr-1 New half-life: 13.59 Hours New Vd from levels: 41.30  Liters  (0.7 L/kg)  Recommendations: ==================== Give Vancomycin  1500 mg  q 24 hrs. Infuse over 2 hrs Expected Cpeak: 48.9 mcg/ml    Expected Ctrough: 15.9 mcg/ml  Recommended labs and intervals: Measure Bun and Scr 3 times/week.   Thank you for the consult, will continue to follow.  Goal of Therapy:  Vancomycin trough level 15-20 mcg/ml Eradicate infection.  Plan:   Increase Vancomycin to 1500mg  IV q24hrs (extra 500mg  dose today)  Re-Check Vancomycin trough at steady state  F/U repeat blood cx, renal fxn, TEE results  [place] Crane, Normajean Nash A 10/08/2014,10:31 AM

## 2014-10-08 NOTE — Progress Notes (Addendum)
   This is not a formal infectious disease consultation but rather informal recommendations given that the patient was found to have MRSA bacteremia.  ID: Mr. Reinard is a 59 year old male with history of bipolar d/o and prior CVA (with resultant cognitive deficits) admitted with ARF/hyperkalemia and worsening AMS. He was found to be febrile to 102.50F on 1/30. Due to concern for infection, he was started on vancomycin and piptazo. Infectious work up revealed 2 sets of blood cultures + MRSA.  antibiotics narrowed to vancomycin and his fever curve has trended downward.He was noted to have cellulitis in the left wrist IV site. LUE ultrasound showed thrombus in the superficial venous system of the left arm but also a non-occlusive thrombus in the Left Internal jugular vein. No other source of MRSA infection has been isolated.[name] He has had persistent thrombocytopenia since being started on vancomycin, BL plt initial 100s now down to 31 x 2 days.  Assessment & Plan:  Agree with primary team evaluating cause of thrombocytopenia, including work up for HIT. Recommend to discontinue vancomycin and switch to daptomycin since there is a concern that it can cause isolated thrombocytopenia. This information has been communicated with Dr. [facility] Antimicrobial Management Team Staphylococcus aureus bacteremia   Staphylococcus aureus bacteremia (SAB) is associated with a high rate of complications and mortality.  Specific aspects of clinical management are critical to optimizing the outcome of patients with SAB.  Therefore, the [facility] Antimicrobial Management Team [facility]) has initiated an intervention aimed at improving the management of SAB at [facility].  To do so, Infectious Diseases physicians are providing an evidence-based consult for the management of all patients with SAB.     Yes No Comments  Perform follow-up blood cultures (even if the patient is afebrile) to ensure clearance of  bacteremia [x]  []  2/2 blood cx NGTD at 49       Perform echocardiography to evaluate for endocarditis (transthoracic ECHO is 40-50% sensitive, TEE is > 90% sensitive) [x]  []  Please get TTE if TEE unable to be done       Ensure source control []  []  Have all abscesses been drained effectively? Have deep seeded infections (septic joints or osteomyelitis) had appropriate surgical debridement?  Investigate for "metastatic" sites of infection []  []  Does the patient have ANY symptom or physical exam finding that would suggest a deeper infection (back or neck pain that may be suggestive of vertebral osteomyelitis or epidural abscess, muscle pain that could be a symptom of pyomyositis)?  Keep in mind that for deep seeded infections MRI imaging with contrast is preferred rather than other often insensitive tests such as plain x-rays, especially early in a patient's presentation.  Change antibiotic therapy to __daptomycin @ 8mg /kg/day___ [x]  []  Changed off of vanco due to thrombocytopenia  Estimated duration of IV antibiotic therapy: 4- 6 wk, [x]  []  Using 2/2 as day #1. For presumed septic thrombosis

## 2014-10-08 NOTE — Progress Notes (Signed)
  Echocardiogram 2D Echocardiogram limited has been performed.  [place], Clyman 10/08/2014, 4:50 PM

## 2014-10-08 NOTE — Progress Notes (Signed)
Patient ID: RICKI CLACK, male   DOB: 12-22-1955, 59 y.o.   MRN: [id number]  [employer] A. [name], MD     [email or web address]          LINZIE BOURSIQUOT is an 59 y.o. male.   Assessment/Plan:  1. Toxic metabolic encephalopathy most likely due to acute renal failure. Dehydration and medication effect compounded by reduced clearance from acute renal impairment are also contributing factors. Also bacteremia now.   2. Agitated confusion and combativeness. Unable to do EEG. We will discontinue since unsuccessful after 2 attempts. The patient currently is on Depakote for behavioral problems but this should also cover for any seizures.  3. Baseline history of dyskinesia. No evidence of tardive dyskinesia or parkinsonism at this time. We will make adjustments in his medications. Because of limited by mouth intake, his Depakote was switched to IV. The haldol will also be switched IM.  4. Baseline vascular dementia.  5. 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  6. Fever with MRSA. ECHO --P  7. Thrombocytopenia - suspect multifactorial. I reduced depakote - which can cause dose dependent thrombocytopenia  8. Anemia.  9. Hyperglycemia.   10. Elevated CPK. Exact etiology unknown but could be related to the patient's vigorous movements. Clinically does not appear to have a myopathy. We will repeat the CPK.  11. POOR ORAL INTAKE. Given the poor oral intake and his obviously high caloric intake, the patient will need another source of nutrition. A NG tube or PEG is recommended.   The patient appears to have been quite animated today. Been quite active and moving around continuously. He has not needed to take most of his oral medications. This is in [place] contrast to the previous 2 days where his movements were a lot better controlled. He also was able to take his oral medications at that time. I did review my notes from the office.  We saw the patient over a  year ago. He was having a lot of excessive movements then and was quite cognitively impaired. Again, this appears to be his baseline although he is probably worse than usual. My notes are outlined below.     GENERAL: Improved.   HEENT: Supple. Atraumatic normocephalic.   ABDOMEN: soft  EXTREMITIES: No edema. There are bandages/dressing seen involving multiple areas of the legs.   BACK: Normal.  SKIN: Normal by inspection.   MENTAL STATUS: The patient is very active today. His moving around quite a bit. He does talk in full 3 word sentences and is comprehensible and appropriate at times but at other times nonsensical.  CRANIAL NERVES: Pupils are large 6 mm but reactive. Initially pupils were deviated downwards. After the patient woke up, he has full extraocular movements. Coronary reflexes are intact. The patient seemed to not to respond to direct threat bilaterally suggestive of possible visual field impairment. Facial muscle strength symmetric.  MOTOR: Bulk and tone are good. The patient moves both sides vigorously.  COORDINATION: No tremors, dystonia or dysmetria noted. No rigidity, bradykinesia or parkinsonism observed.   SENSATION: Normal to deep painful stimuli.      MY OFFICE  CONSULT NOTE [phone]  [[[[[[[[[[[[[[[[[[[[[[[[[[[[[[[[[[[[[[[[[[[[[[[[[[[[[[Patient has been referred to our office by [facility] for tremors and other involuntary movements.  Patient's caregiver states that patient moves all the time and sometimes his movements are worse.  Patient states that he has moved his head all of his life.  Creed Copper    This is a 42 year many  is referred for tremors. History is extremely difficult as he has obvious cognitive impairment at baseline. It is unclear if this is static process or represents a deterioration in a otherwise functional baseline. From what I can ascertain, this may be a static problem. The patient appears to have been in institutional facilities for a  long time. He has been on the current facility for last 6 months. The care provider tells me that he has had these tremors ever since then. Again, there is very little data available to analyze. The patient was recently hospitalized but to 3 weeks ago for medical problems was noted to have altered mental status and falls. Workup including labs and head CT scan. Head CT scan is outlined below and is significant for old cortical infarct involving the right occipital area along with several other lacunar infarcts. Nothing acute is seen. The care provider tells me that he is had the tremors ever since he has been at the facility. The patient tells [place] that he has had this for a long time ever since he was a child but this does not seem likely. After much to do and didn't even after the patient left we were getting history from them. It appears that he actually seen the psychiatrist for his schizophrenia. They made adjustments in his medication last week and when he was seen but so far these adjustments have not been made. It turns out that he actually was supposed to stop the respirdal and an surgical was started. Additionally, the Depakote was slated to be increased to the same level which we had suggested below.  PAST MEDICAL HISTORY: Cardiomyopathy ICD#425.4  Renal insufficiency   ICD#586  Unspecified affective psychosis   ICD#296.90  Insomnia   ICD#780.52  Hyponatremia NOS   ICD#276.1  Coronary atherosclerosis Of unspecified type of vessel, native or graft   ICD#414.00  Weakness   ICD#728.87  TIA [Transient ischemic attack]   ICD#435.9  Dystonia   ICD#333.79  Hypertension   ICD#401.9  Mental status change   ICD#780.97  Hyperpotassemia Dissecting Aortic Aneurysm Abnormal Glucose Lack of Coordination            Diabetes - Type 2   ICD#250.00  Hyperlipidemia   ICD#272.4   MEDICATIONS: Temazepam: 15 mg capsule SIG-   qhs    Labetalol (Normodyne):   200 mg tablet  SIG-  1 each   2 times a day     Baclofen (Lioresal):   10 mg tablet  SIG-  1 each    3 times a day    Benztropine Mesylate: 1 mg tablet SIG-   tid    Depakote ER: 250 mg tablet, extended release SIG-  tid    RisperiDONE: 1 mg tablet SIG-   tid    Aspirin: 325 mg tablet SIG-   daily    Clonidine (Catapres):   0.3 mg tablet  daily Flonase (Fluticasone) Nasal Spray:   0.05 mg/inh (spray)  SIG-  2 inhalation  in each nostril    once daily    Glyburide (Diabeta, Micronase):   5 mg tablet  SIG-  1 each   once a day   K-Dur 20:  20 mEq tablet, extended release  SIG-  1 tab(s)    daily Altace  (Ramipril):   10 mg tablet  SIG-  1 each    once daily     Simvastatin (Zocor):   40 mg tablet  SIG-  1 each   once a day  ALLERGY:  Drug Allergies.  No Known.  SURGERIES: Unknown  FAMILY HISTORY: Family History - 1st Degree Relatives:  Father is dead.  Mother dead.    Alzheimer's disease   ICD#331.0  Onset: 09/22/2013 11:53  -mother  TOBACCO HISTORY: Smoker Status:   Never smoker   ICD#V13.89  ALCOHOL HISTORY: Denies  SOCIAL HISTORY: Single.    [facility] notes He lives in [place] with his sister.  He has  no children.  He has never been married.  He is disabled.  He is a prior  worker in a [employer].  REVIEW OF SYSTEMS:   OBJECTIVE DATA: VS: BMI: 23.1.  BP: 144/69.  H: 69.00 in.  P: 64 /min.  W: 156lbs 0oz.     GENERAL: This is a thin man who is in a wheelchair. He does ambulate at baseline per the care provider but for reasons unknown he is no wheelchair.  HEENT: Supple. Atraumatic normocephalic. The patient has continuous vertical dyskinetic head movements along with rotational and horizontal movements. There is also movements of the tongue. The patient is quite restless. This restlessness is essentially continuous.  ABDOMEN: soft  EXTREMITIES: No edema   BACK: Normal.  SKIN: Normal by inspection.    MENTAL STATUS: The patient is awake and alert. He cooperates with evaluation. His speech  is severely dysarthric although he does follow commands briskly. Communication is severely reduced/impaired. He is oriented 1.  CRANIAL NERVES: Pupils are equal, round and reactive to light and accommodation; extra ocular movements are full, there is no significant  nystagmus; visual fields are full; upper and lower facial muscles are normal in strength and symmetric, there is no flattening of the nasolabial folds; tongue is midline; uvula is midline; shoulder elevation is normal.  MOTOR: Normal tone, bulk and strength; no pronator drift.  COORDINATION: Continuous movements and restlessness of the extremities. No rigidity, cogwheeling or bradykinesia.  REFLEXES: Deep tendon reflexes are symmetrical and normal. Babinski reflexes are flexor bilaterally.   SENSATION: Normal to light touch.  GAIT: The patient is in a wheelchair. He can stand with little assistance and has decent strides.   Hospital labs couple weeks ago as follows: Depakote level is reviewed and has always been subtherapeutic for the last 3 years. The most recent 2 weeks ago was 14.8. The least was undetected and highest several months ago was 21. ESR 62, RPR nonreactive, normal TSH and vitamin B12 596.  HEAD CT 09-2013 CLINICAL DATA:  Altered mental status, fall, patient is unresponsive   EXAM: CT HEAD WITHOUT CONTRAST   TECHNIQUE: Contiguous axial images were obtained from the base of the skull through the vertex without intravenous contrast.   COMPARISON:  06/14/2013   FINDINGS: Extensive debris within the nasal passages bilaterally. Opacification of the right frontal sinuses an inferior anterior right ethmoid air cells. Sphenoid and maxillary sinuses appear clear. Mastoid air cells are clear. No evidence of fracture involving the skull.   There is moderate diffuse atrophy. There is focal low attenuation in the right occipital lobe. This is stable when compared to the prior study and is consistent with  encephalomalacia related to prior infarct. There is mild scattered white matter disease in the periventricular white matter bilaterally which is stable. There is evidence of prior left basal ganglia lacunar infarcts and possibly  tiny lacunar infarct on the right just above the basal ganglia. These findings are unchanged. There is no hemorrhage or extra-axial fluid. There is no evidence of acute vascular territory  infarct.   Low attenuation in the anterior inferior right cerebellum suggests prior infarct. This is also stable.   IMPRESSION: Tremor   ICD#781.0  Onset: 09/22/2013 11:49  - Neuroleptic induced.   Previous CVA (stroke) with residual effects   ICD#438.9  Onset: 09/22/2013 11:49   Encephalopathy not elsewhere classified   ICD#348.39  Onset: 09/22/2013 11:55  PLAN: 1. Reduce risperidone to 93m/day. 2. DC Depakote ER 3. DC Seroquel  4. Start Depakote Sprinkles 1218m4 bid   FOLLOW UP: 4 weeks]]]]]]]]]]]]]]]]]]]]]]]]]]]]]]]]]]]]]]]]]]]]]]]]]]]]]]]]]]]]]]]]]]]]]]]]]]]]]]]]]]]]]]]]]]]]]]]]]]]]  Objective: Vital signs in last 24 hours: Temp:  [97.7 F (36.5 C)-98.6 F (37 C)] 98.1 F (36.7 C) (02/04 1700) Pulse Rate:  [80-109] 88 (02/04 1800) Resp:  [15-34] 22 (02/04 1800) BP: (123-163)/(54-109) 141/60 mmHg (02/04 1800) SpO2:  [92 %-100 %] 96 % (02/04 1800)  Intake/Output from previous day: 02/03 0701 - 02/04 0700 In: 3915 [P.O.:240; I.V.:3000; IV Piggyback:200] Out: 100 [Urine:100] Intake/Output this shift:   Nutritional status: Diet Carb Modified   Lab Results: Results for orders placed or performed during the hospital encounter of 09/25/14 (from the past 48 hour(s))  Glucose, capillary     Status: Abnormal   Collection Time: 10/06/14  9:59 PM  Result Value Ref Range   Glucose-Capillary 182 (H) 70 - 99 mg/dL  Basic metabolic panel     Status: Abnormal   Collection Time: 10/07/14  4:57 AM  Result Value Ref Range   Sodium 135 135 - 145 mmol/L   Potassium  3.2 (L) 3.5 - 5.1 mmol/L   Chloride 104 96 - 112 mmol/L   CO2 26 19 - 32 mmol/L   Glucose, Bld 180 (H) 70 - 99 mg/dL   BUN 35 (H) 6 - 23 mg/dL   Creatinine, Ser 1.28 0.50 - 1.35 mg/dL   Calcium 7.4 (L) 8.4 - 10.5 mg/dL   GFR calc non Af Amer 61 (L) >90 mL/min   GFR calc Af Amer 70 (L) >90 mL/min    Comment: (NOTE) The eGFR has been calculated using the CKD EPI equation. This calculation has not been validated in all clinical situations. eGFR's persistently <90 mL/min signify possible Chronic Kidney Disease.    Anion gap 5 5 - 15  CBC with Differential     Status: Abnormal   Collection Time: 10/07/14  4:57 AM  Result Value Ref Range   WBC 3.7 (L) 4.0 - 10.5 K/uL   RBC 3.37 (L) 4.22 - 5.81 MIL/uL   Hemoglobin 9.9 (L) 13.0 - 17.0 g/dL   HCT 30.3 (L) 39.0 - 52.0 %   MCV 89.9 78.0 - 100.0 fL   MCH 29.4 26.0 - 34.0 pg   MCHC 32.7 30.0 - 36.0 g/dL   RDW 12.9 11.5 - 15.5 %   Platelets 31 (L) 150 - 400 K/uL    Comment: SPECIMEN CHECKED FOR CLOTS CONSISTENT WITH PREVIOUS RESULT    Neutrophils Relative % 69 43 - 77 %   Neutro Abs 2.6 1.7 - 7.7 K/uL   Lymphocytes Relative 19 12 - 46 %   Lymphs Abs 0.7 0.7 - 4.0 K/uL   Monocytes Relative 12 3 - 12 %   Monocytes Absolute 0.5 0.1 - 1.0 K/uL   Eosinophils Relative 1 0 - 5 %   Eosinophils Absolute 0.0 0.0 - 0.7 K/uL   Basophils Relative 0 0 - 1 %   Basophils Absolute 0.0 0.0 - 0.1 K/uL  Glucose, capillary     Status: Abnormal   Collection Time: 10/07/14  7:57 AM  Result Value Ref Range   Glucose-Capillary 167 (H) 70 - 99 mg/dL   Comment 1 Documented in Chart    Comment 2 Notify RN   Glucose, capillary     Status: Abnormal   Collection Time: 10/07/14 11:50 AM  Result Value Ref Range   Glucose-Capillary 141 (H) 70 - 99 mg/dL   Comment 1 Documented in Chart    Comment 2 Notify RN   Glucose, capillary     Status: Abnormal   Collection Time: 10/07/14  4:55 PM  Result Value Ref Range   Glucose-Capillary 168 (H) 70 - 99 mg/dL    Urinalysis, Routine w reflex microscopic     Status: Abnormal   Collection Time: 10/07/14  5:26 PM  Result Value Ref Range   Color, Urine ORANGE (A) YELLOW    Comment: BIOCHEMICALS MAY BE AFFECTED BY COLOR   APPearance HAZY (A) CLEAR   Specific Gravity, Urine 1.025 1.005 - 1.030   pH 5.5 5.0 - 8.0   Glucose, UA NEGATIVE NEGATIVE mg/dL   Hgb urine dipstick LARGE (A) NEGATIVE   Bilirubin Urine SMALL (A) NEGATIVE   Ketones, ur TRACE (A) NEGATIVE mg/dL   Protein, ur 100 (A) NEGATIVE mg/dL   Urobilinogen, UA 1.0 0.0 - 1.0 mg/dL   Nitrite NEGATIVE NEGATIVE   Leukocytes, UA NEGATIVE NEGATIVE  Urine microscopic-add on     Status: Abnormal   Collection Time: 10/07/14  5:26 PM  Result Value Ref Range   Squamous Epithelial / LPF MANY (A) RARE   WBC, UA 0-2 <3 WBC/hpf   RBC / HPF TOO NUMEROUS TO COUNT <3 RBC/hpf   Bacteria, UA MANY (A) RARE  Vitamin B12     Status: None   Collection Time: 10/07/14  5:34 PM  Result Value Ref Range   Vitamin B-12 630 211 - 911 pg/mL    Comment: Performed at [facility]  Folate     Status: None   Collection Time: 10/07/14  5:34 PM  Result Value Ref Range   Folate 8.8 ng/mL    Comment: (NOTE) Reference Ranges        Deficient:       0.4 - 3.3 ng/mL        Indeterminate:   3.4 - 5.4 ng/mL        Normal:              > 5.4 ng/mL Performed at [facility]   Iron and TIBC     Status: Abnormal   Collection Time: 10/07/14  5:34 PM  Result Value Ref Range   Iron <10 (L) 42 - 165 ug/dL   TIBC Not calculated due to Iron <10. 215 - 435 ug/dL   Saturation Ratios Not calculated due to Iron <10. 20 - 55 %   UIBC 126 125 - 400 ug/dL    Comment: Performed at [facility]  Ferritin     Status: Abnormal   Collection Time: 10/07/14  5:34 PM  Result Value Ref Range   Ferritin 849 (H) 22 - 322 ng/mL    Comment: Performed at [facility]  Reticulocytes     Status: Abnormal   Collection Time: 10/07/14  5:34 PM  Result Value Ref  Range   Retic Ct Pct 0.4 0.4 - 3.1 %   RBC. 3.53 (L) 4.22 - 5.81 MIL/uL   Retic Count, Manual 14.1 (L) 19.0 - 186.0 K/uL  CBC     Status: Abnormal   Collection Time: 10/07/14  5:34 PM  Result Value Ref Range   WBC 5.8 4.0 - 10.5 K/uL   RBC 3.53 (L) 4.22 - 5.81 MIL/uL   Hemoglobin 10.4 (L) 13.0 - 17.0 g/dL   HCT 31.2 (L) 39.0 - 52.0 %   MCV 88.4 78.0 - 100.0 fL   MCH 29.5 26.0 - 34.0 pg   MCHC 33.3 30.0 - 36.0 g/dL   RDW 12.8 11.5 - 15.5 %   Platelets 28 (LL) 150 - 400 K/uL    Comment: DELTA CHECK NOTED RESULT REPEATED AND VERIFIED CRITICAL RESULT CALLED TO, READ BACK BY AND VERIFIED WITH: PHILLIPS,S AT 1815 ON 10/07/2014 BY ISLEY,B   Glucose, capillary     Status: Abnormal   Collection Time: 10/07/14  9:29 PM  Result Value Ref Range   Glucose-Capillary 114 (H) 70 - 99 mg/dL  CBC with Differential     Status: Abnormal   Collection Time: 10/08/14  7:40 AM  Result Value Ref Range   WBC 6.2 4.0 - 10.5 K/uL   RBC 3.37 (L) 4.22 - 5.81 MIL/uL   Hemoglobin 10.0 (L) 13.0 - 17.0 g/dL   HCT 29.6 (L) 39.0 - 52.0 %   MCV 87.8 78.0 - 100.0 fL   MCH 29.7 26.0 - 34.0 pg   MCHC 33.8 30.0 - 36.0 g/dL   RDW 12.7 11.5 - 15.5 %   Platelets 24 (LL) 150 - 400 K/uL    Comment: RESULT REPEATED AND VERIFIED CRITICAL RESULT CALLED TO, READ BACK BY AND VERIFIED WITH:  NEDINE ROWE RN ON 2 4 2016 AT 0806 BY MURIKA WOODS    Neutrophils Relative % 77 43 - 77 %   Neutro Abs 4.8 1.7 - 7.7 K/uL   Lymphocytes Relative 13 12 - 46 %   Lymphs Abs 0.8 0.7 - 4.0 K/uL   Monocytes Relative 9 3 - 12 %   Monocytes Absolute 0.6 0.1 - 1.0 K/uL   Eosinophils Relative 1 0 - 5 %   Eosinophils Absolute 0.0 0.0 - 0.7 K/uL   Basophils Relative 0 0 - 1 %   Basophils Absolute 0.0 0.0 - 0.1 K/uL  Vancomycin, trough     Status: None   Collection Time: 10/08/14  7:43 AM  Result Value Ref Range   Vancomycin Tr 10.3 10.0 - 20.0 ug/mL  Basic metabolic panel     Status: Abnormal   Collection Time: 10/08/14  7:45 AM  Result  Value Ref Range   Sodium 138 135 - 145 mmol/L   Potassium 3.1 (L) 3.5 - 5.1 mmol/L   Chloride 107 96 - 112 mmol/L   CO2 27 19 - 32 mmol/L   Glucose, Bld 191 (H) 70 - 99 mg/dL   BUN 34 (H) 6 - 23 mg/dL   Creatinine, Ser 1.22 0.50 - 1.35 mg/dL   Calcium 7.7 (L) 8.4 - 10.5 mg/dL   GFR calc non Af Amer 64 (L) >90 mL/min   GFR calc Af Amer 74 (L) >90 mL/min    Comment: (NOTE) The eGFR has been calculated using the CKD EPI equation. This calculation has not been validated in all clinical situations. eGFR's persistently <90 mL/min signify possible Chronic Kidney Disease.    Anion gap 4 (L) 5 - 15  Glucose, capillary     Status: Abnormal   Collection Time: 10/08/14  7:56 AM  Result Value Ref Range   Glucose-Capillary 186 (H) 70 - 99 mg/dL   Comment 1 Documented in Chart    Comment 2 Notify RN  Glucose, capillary     Status: Abnormal   Collection Time: 10/08/14 11:40 AM  Result Value Ref Range   Glucose-Capillary 152 (H) 70 - 99 mg/dL  Lactate dehydrogenase     Status: Abnormal   Collection Time: 10/08/14  3:01 PM  Result Value Ref Range   LDH 332 (H) 94 - 250 U/L  CK     Status: Abnormal   Collection Time: 10/08/14  3:04 PM  Result Value Ref Range   Total CK 1152 (H) 7 - 232 U/L  Glucose, capillary     Status: Abnormal   Collection Time: 10/08/14  4:56 PM  Result Value Ref Range   Glucose-Capillary 167 (H) 70 - 99 mg/dL    Lipid Panel No results for input(s): CHOL, TRIG, HDL, CHOLHDL, VLDL, LDLCALC in the last 72 hours.  Studies/Results:   Medications:  Scheduled Meds: . antiseptic oral rinse  7 mL Mouth Rinse BID  . DAPTOmycin (CUBICIN)  IV  470 mg Intravenous Q24H  . feeding supplement (ENSURE COMPLETE)  237 mL Oral QHS  . haloperidol  5 mg Oral TID  . insulin aspart  0-9 Units Subcutaneous TID WC & HS  . QUEtiapine  200 mg Oral BID  . sodium chloride  3 mL Intravenous Q12H  . valproic acid  500 mg Oral BID   Continuous Infusions: . dextrose 5 % and 0.45% NaCl  125 mL/hr at 10/08/14 1800   PRN Meds:.acetaminophen **OR** acetaminophen, chlorproMAZINE (THORAZINE) injection, haloperidol lactate, labetalol     LOS: 13 days   Miliyah Luper A. [name], M.D.  Diplomate, Tax adviser of Psychiatry and Neurology ( Neurology).

## 2014-10-08 NOTE — Plan of Care (Signed)
Problem: Phase I Progression Outcomes Goal: Voiding-avoid urinary catheter unless indicated Outcome: Not Met (add Reason) Pt remains confused and agitated when awake. Unto determine when he needs to void or have a bowel movement

## 2014-10-08 NOTE — Clinical Social Work Note (Signed)
CSW met with patient.  Patient remains disoriented and unable to follow conversation/questions.     Legrand Como [phone]

## 2014-10-08 NOTE — Progress Notes (Signed)
Subjective: Patient is seen in bed with mittens in place.  He is communicative this AM, but with inappropraite verbal responses.  He does not open his mouth this AM for me to check for petechiae.  NAD.  Foley in place.  Objective: Vital signs in last 24 hours: Temp:  [97.7 F (36.5 C)-99.1 F (37.3 C)] 98 F (36.7 C) (02/04 0400) Pulse Rate:  [80-106] 91 (02/04 0600) Resp:  [15-41] 23 (02/04 0600) BP: (123-173)/(54-113) 136/62 mmHg (02/04 0500) SpO2:  [91 %-100 %] 97 % (02/04 0600)  Intake/Output from previous day: 02/03 0800 - 02/04 0759 In: 3915 [P.O.:240; I.V.:3000; IV Piggyback:200] Out: 100 [Urine:100] Intake/Output this shift:    General appearance: alert, appears older than stated age, no distress, slowed mentation and uncooperative Resp: clear to auscultation bilaterally Cardio: regular rate and rhythm GI: soft, non-tender; bowel sounds normal; no masses,  no organomegaly Extremities: extremities normal, atraumatic, no cyanosis or edema and no rash Foley: Yellow urine with clots.  No gross hematuria this AM.    Lab Results:   Recent Labs  10/07/14 1734 10/08/14 0740  WBC 5.8 6.2  HGB 10.4* 10.0*  HCT 31.2* 29.6*  PLT 28* 24*   BMET  Recent Labs  10/07/14 0457 10/08/14 0745  NA 135 138  K 3.2* 3.1*  CL 104 107  CO2 26 27  GLUCOSE 180* 191*  BUN 35* 34*  CREATININE 1.28 1.22  CALCIUM 7.4* 7.7*    Studies/Results: No results found.  Medications: I have reviewed the patient's current medications.  Assessment/Plan: 1. Thrombocytopenia, mild drop compared to yesterday.  Down to 24,000. No active bleeding this AM.Foley demonstrates clear urine.  Will perform repeat CBC after lunch and then tomorrow AM.  If platelets drop to 20,000 or less, and/or active bleeding is noted then pheresed platelets should be ordered. Will order SPEP with IFE, LDH, hepatitis panel, HIV antibody, ANA, ESR, CRP, EBV panel, H. Pylori panel. 2. Left internal jugular  non-occlusive thrombus (acute versus subacute). Started on 1/2 dose dose Lovenox BID on 10/06/2014 for anticoagulation, but discontinued on 10/07/2014 due to #5. No anticoagulation recommended at this time. 3. Progressive anemia. Improved. Likely secondary to gross hematuria. Stool cards are ordered. UA with reflex on 10/07/2014 was negative for signs of infection. 4. Bicytopenia. If counts do not improve soon, he would be a candidate for bone marrow aspiration and biopsy, but utility of that test is questionable as he is not a candidate for aggressive intervention for dysplastic syndromes, except possibly 5Q- MDS. Will monitor labs closely. 5. Gross hematuria. UA on 10/08/2014 is negative for signs of infection. Half dose Lovenox is discontinued on 10/07/2014 after being started on 10/06/2014 for #2. 6. Leukopenia, resolved.  Patient and plan discussed with Dr. Ancil Linsey and she is in agreement with the aforementioned.     LOS: 13 days    KEFALAS,THOMAS 10/08/2014 Pager: [phone]   Patient has longstanding pancytopenia (on review of labs prior to admit)  I suspect worsening of counts most likely from infection, meds, hematuria. Continue to hold lovenox.  Will consider restarting anticoagulation once platelet count above 60K and patient without evidence of bleeding. Molli Hazard, MD

## 2014-10-08 NOTE — Progress Notes (Signed)
Subjective: Patient is more awake but continue to be confused and combative requiring sedation. His platelet continue to drop. Lovenox is discontinued yesterday. No fever or chills. He is on IV vancomycin. TEE  Is pending. Objective: Vital signs in last 24 hours: Temp:  [97.7 F (36.5 C)-99.1 F (37.3 C)] 98 F (36.7 C) (02/04 0400) Pulse Rate:  [80-106] 91 (02/04 0600) Resp:  [15-41] 23 (02/04 0600) BP: (123-173)/(54-113) 136/62 mmHg (02/04 0500) SpO2:  [91 %-100 %] 97 % (02/04 0600) Weight change:  Last BM Date: 10/07/14  Intake/Output from previous day: 02/03 0701 - 02/04 0700 In: 3915 [P.O.:240; I.V.:3000; IV Piggyback:200] Out: 100 [Urine:100]  PHYSICAL EXAM General appearance: no distress and slowed mentation Resp: diminished breath sounds bilaterally and rhonchi bilaterally Cardio: S1, S2 normal GI: soft, non-tender; bowel sounds normal; no masses,  no organomegaly Extremities: extremities normal, atraumatic, no cyanosis or edema  Lab Results:  Results for orders placed or performed during the hospital encounter of 09/25/14 (from the past 48 hour(s))  Glucose, capillary     Status: Abnormal   Collection Time: 10/06/14 11:53 AM  Result Value Ref Range   Glucose-Capillary 243 (H) 70 - 99 mg/dL   Comment 1 Notify RN   Glucose, capillary     Status: Abnormal   Collection Time: 10/06/14  4:32 PM  Result Value Ref Range   Glucose-Capillary 234 (H) 70 - 99 mg/dL   Comment 1 Notify RN   Glucose, capillary     Status: Abnormal   Collection Time: 10/06/14  9:59 PM  Result Value Ref Range   Glucose-Capillary 182 (H) 70 - 99 mg/dL  Basic metabolic panel     Status: Abnormal   Collection Time: 10/07/14  4:57 AM  Result Value Ref Range   Sodium 135 135 - 145 mmol/L   Potassium 3.2 (L) 3.5 - 5.1 mmol/L   Chloride 104 96 - 112 mmol/L   CO2 26 19 - 32 mmol/L   Glucose, Bld 180 (H) 70 - 99 mg/dL   BUN 35 (H) 6 - 23 mg/dL   Creatinine, Ser 1.28 0.50 - 1.35 mg/dL   Calcium  7.4 (L) 8.4 - 10.5 mg/dL   GFR calc non Af Amer 61 (L) >90 mL/min   GFR calc Af Amer 70 (L) >90 mL/min    Comment: (NOTE) The eGFR has been calculated using the CKD EPI equation. This calculation has not been validated in all clinical situations. eGFR's persistently <90 mL/min signify possible Chronic Kidney Disease.    Anion gap 5 5 - 15  CBC with Differential     Status: Abnormal   Collection Time: 10/07/14  4:57 AM  Result Value Ref Range   WBC 3.7 (L) 4.0 - 10.5 K/uL   RBC 3.37 (L) 4.22 - 5.81 MIL/uL   Hemoglobin 9.9 (L) 13.0 - 17.0 g/dL   HCT 30.3 (L) 39.0 - 52.0 %   MCV 89.9 78.0 - 100.0 fL   MCH 29.4 26.0 - 34.0 pg   MCHC 32.7 30.0 - 36.0 g/dL   RDW 12.9 11.5 - 15.5 %   Platelets 31 (L) 150 - 400 K/uL    Comment: SPECIMEN CHECKED FOR CLOTS CONSISTENT WITH PREVIOUS RESULT    Neutrophils Relative % 69 43 - 77 %   Neutro Abs 2.6 1.7 - 7.7 K/uL   Lymphocytes Relative 19 12 - 46 %   Lymphs Abs 0.7 0.7 - 4.0 K/uL   Monocytes Relative 12 3 - 12 %   Monocytes   Absolute 0.5 0.1 - 1.0 K/uL   Eosinophils Relative 1 0 - 5 %   Eosinophils Absolute 0.0 0.0 - 0.7 K/uL   Basophils Relative 0 0 - 1 %   Basophils Absolute 0.0 0.0 - 0.1 K/uL  Glucose, capillary     Status: Abnormal   Collection Time: 10/07/14  7:57 AM  Result Value Ref Range   Glucose-Capillary 167 (H) 70 - 99 mg/dL   Comment 1 Documented in Chart    Comment 2 Notify RN   Glucose, capillary     Status: Abnormal   Collection Time: 10/07/14 11:50 AM  Result Value Ref Range   Glucose-Capillary 141 (H) 70 - 99 mg/dL   Comment 1 Documented in Chart    Comment 2 Notify RN   Glucose, capillary     Status: Abnormal   Collection Time: 10/07/14  4:55 PM  Result Value Ref Range   Glucose-Capillary 168 (H) 70 - 99 mg/dL  Urinalysis, Routine w reflex microscopic     Status: Abnormal   Collection Time: 10/07/14  5:26 PM  Result Value Ref Range   Color, Urine ORANGE (A) YELLOW    Comment: BIOCHEMICALS MAY BE AFFECTED BY  COLOR   APPearance HAZY (A) CLEAR   Specific Gravity, Urine 1.025 1.005 - 1.030   pH 5.5 5.0 - 8.0   Glucose, UA NEGATIVE NEGATIVE mg/dL   Hgb urine dipstick LARGE (A) NEGATIVE   Bilirubin Urine SMALL (A) NEGATIVE   Ketones, ur TRACE (A) NEGATIVE mg/dL   Protein, ur 100 (A) NEGATIVE mg/dL   Urobilinogen, UA 1.0 0.0 - 1.0 mg/dL   Nitrite NEGATIVE NEGATIVE   Leukocytes, UA NEGATIVE NEGATIVE  Urine microscopic-add on     Status: Abnormal   Collection Time: 10/07/14  5:26 PM  Result Value Ref Range   Squamous Epithelial / LPF MANY (A) RARE   WBC, UA 0-2 <3 WBC/hpf   RBC / HPF TOO NUMEROUS TO COUNT <3 RBC/hpf   Bacteria, UA MANY (A) RARE  Vitamin B12     Status: None   Collection Time: 10/07/14  5:34 PM  Result Value Ref Range   Vitamin B-12 630 211 - 911 pg/mL    Comment: Performed at [facility]  Folate     Status: None   Collection Time: 10/07/14  5:34 PM  Result Value Ref Range   Folate 8.8 ng/mL    Comment: (NOTE) Reference Ranges        Deficient:       0.4 - 3.3 ng/mL        Indeterminate:   3.4 - 5.4 ng/mL        Normal:              > 5.4 ng/mL Performed at [facility]   Iron and TIBC     Status: Abnormal   Collection Time: 10/07/14  5:34 PM  Result Value Ref Range   Iron <10 (L) 42 - 165 ug/dL   TIBC Not calculated due to Iron <10. 215 - 435 ug/dL   Saturation Ratios Not calculated due to Iron <10. 20 - 55 %   UIBC 126 125 - 400 ug/dL    Comment: Performed at [facility]  Ferritin     Status: Abnormal   Collection Time: 10/07/14  5:34 PM  Result Value Ref Range   Ferritin 849 (H) 22 - 322 ng/mL    Comment: Performed at [facility]  Reticulocytes       Status: Abnormal   Collection Time: 10/07/14  5:34 PM  Result Value Ref Range   Retic Ct Pct 0.4 0.4 - 3.1 %   RBC. 3.53 (L) 4.22 - 5.81 MIL/uL   Retic Count, Manual 14.1 (L) 19.0 - 186.0 K/uL  CBC     Status: Abnormal   Collection Time: 10/07/14  5:34 PM  Result Value Ref  Range   WBC 5.8 4.0 - 10.5 K/uL   RBC 3.53 (L) 4.22 - 5.81 MIL/uL   Hemoglobin 10.4 (L) 13.0 - 17.0 g/dL   HCT 31.2 (L) 39.0 - 52.0 %   MCV 88.4 78.0 - 100.0 fL   MCH 29.5 26.0 - 34.0 pg   MCHC 33.3 30.0 - 36.0 g/dL   RDW 12.8 11.5 - 15.5 %   Platelets 28 (LL) 150 - 400 K/uL    Comment: DELTA CHECK NOTED RESULT REPEATED AND VERIFIED CRITICAL RESULT CALLED TO, READ BACK BY AND VERIFIED WITH: PHILLIPS,S AT 1815 ON 10/07/2014 BY ISLEY,B   Glucose, capillary     Status: Abnormal   Collection Time: 10/07/14  9:29 PM  Result Value Ref Range   Glucose-Capillary 114 (H) 70 - 99 mg/dL  CBC with Differential     Status: Abnormal   Collection Time: 10/08/14  7:40 AM  Result Value Ref Range   WBC 6.2 4.0 - 10.5 K/uL   RBC 3.37 (L) 4.22 - 5.81 MIL/uL   Hemoglobin 10.0 (L) 13.0 - 17.0 g/dL   HCT 29.6 (L) 39.0 - 52.0 %   MCV 87.8 78.0 - 100.0 fL   MCH 29.7 26.0 - 34.0 pg   MCHC 33.8 30.0 - 36.0 g/dL   RDW 12.7 11.5 - 15.5 %   Platelets 24 (LL) 150 - 400 K/uL    Comment: RESULT REPEATED AND VERIFIED CRITICAL RESULT CALLED TO, READ BACK BY AND VERIFIED WITH:  NEDINE ROWE RN ON 2 4 2016 AT 0806 BY MURIKA WOODS    Neutrophils Relative % 77 43 - 77 %   Neutro Abs 4.8 1.7 - 7.7 K/uL   Lymphocytes Relative 13 12 - 46 %   Lymphs Abs 0.8 0.7 - 4.0 K/uL   Monocytes Relative 9 3 - 12 %   Monocytes Absolute 0.6 0.1 - 1.0 K/uL   Eosinophils Relative 1 0 - 5 %   Eosinophils Absolute 0.0 0.0 - 0.7 K/uL   Basophils Relative 0 0 - 1 %   Basophils Absolute 0.0 0.0 - 0.1 K/uL  Vancomycin, trough     Status: None   Collection Time: 10/08/14  7:43 AM  Result Value Ref Range   Vancomycin Tr 10.3 10.0 - 20.0 ug/mL  Basic metabolic panel     Status: Abnormal   Collection Time: 10/08/14  7:45 AM  Result Value Ref Range   Sodium 138 135 - 145 mmol/L   Potassium 3.1 (L) 3.5 - 5.1 mmol/L   Chloride 107 96 - 112 mmol/L   CO2 27 19 - 32 mmol/L   Glucose, Bld 191 (H) 70 - 99 mg/dL   BUN 34 (H) 6 - 23 mg/dL    Creatinine, Ser 1.22 0.50 - 1.35 mg/dL   Calcium 7.7 (L) 8.4 - 10.5 mg/dL   GFR calc non Af Amer 64 (L) >90 mL/min   GFR calc Af Amer 74 (L) >90 mL/min    Comment: (NOTE) The eGFR has been calculated using the CKD EPI equation. This calculation has not been validated in all clinical situations. eGFR's persistently <90 mL/min  signify possible Chronic Kidney Disease.    Anion gap 4 (L) 5 - 15  Glucose, capillary     Status: Abnormal   Collection Time: 10/08/14  7:56 AM  Result Value Ref Range   Glucose-Capillary 186 (H) 70 - 99 mg/dL   Comment 1 Documented in Chart    Comment 2 Notify RN     ABGS No results for input(s): PHART, PO2ART, TCO2, HCO3 in the last 72 hours.  Invalid input(s): PCO2 CULTURES Recent Results (from the past 240 hour(s))  Culture, blood (routine x 2)     Status: None   Collection Time: 10/03/14  9:58 AM  Result Value Ref Range Status   Specimen Description BLOOD RIGHT ANTECUBITAL  Final   Special Requests BOTTLES DRAWN AEROBIC AND ANAEROBIC 6CC  Final   Culture   Final    STAPHYLOCOCCUS AUREUS Note: Gram Stain Report Called to,Read Back By and Verified With: LEE B @0330 ON 10/04/14 BY WOODIE J SUSCEPTIBILITIES PERFORMED ON PREVIOUS CULTURE WITHIN THE LAST 5 DAYS. Performed at [facility]    Report Status 10/06/2014 FINAL  Final  Culture, blood (routine x 2)     Status: None   Collection Time: 10/03/14  9:59 AM  Result Value Ref Range Status   Specimen Description BLOOD RIGHT ARM  Final   Special Requests BOTTLES DRAWN AEROBIC AND ANAEROBIC 8CC  Final   Culture   Final    METHICILLIN RESISTANT STAPHYLOCOCCUS AUREUS Note: Gram Stain Report Called to,Read Back By and Verified With: LEE B @ 0330 ON 10/04/14 BY WOODIEB RIFAMPIN AND GENTAMICIN SHOULD NOT BE USED AS SINGLE DRUGS FOR TREATMENT OF STAPH INFECTIONS. This organism DOES NOT demonstrate inducible Clindamycin  resistance in vitro. CRITICAL RESULT CALLED TO, READ BACK BY AND VERIFIED WITH:  TRACY NELSON RN AT [facility] AT [phone] BY CASTC Performed at [facility]    Report Status 10/06/2014 FINAL  Final   Organism ID, Bacteria METHICILLIN RESISTANT STAPHYLOCOCCUS AUREUS  Final      Susceptibility   Methicillin resistant staphylococcus aureus - MIC*    CLINDAMYCIN <=0.25 SENSITIVE Sensitive     ERYTHROMYCIN >=8 RESISTANT Resistant     GENTAMICIN <=0.5 SENSITIVE Sensitive     LEVOFLOXACIN 4 INTERMEDIATE Intermediate     OXACILLIN >=4 RESISTANT Resistant     PENICILLIN >=0.5 RESISTANT Resistant     RIFAMPIN <=0.5 SENSITIVE Sensitive     TRIMETH/SULFA <=10 SENSITIVE Sensitive     VANCOMYCIN <=0.5 SENSITIVE Sensitive     TETRACYCLINE <=1 SENSITIVE Sensitive     * METHICILLIN RESISTANT STAPHYLOCOCCUS AUREUS  Culture, Urine     Status: None   Collection Time: 10/03/14 10:00 AM  Result Value Ref Range Status   Specimen Description URINE, CATHETERIZED  Final   Special Requests NONE  Final   Colony Count NO GROWTH Performed at [facility]   Final   Culture NO GROWTH Performed at [facility]   Final   Report Status 10/04/2014 FINAL  Final  Culture, blood (routine x 2)     Status: None (Preliminary result)   Collection Time: 10/06/14  6:15 AM  Result Value Ref Range Status   Specimen Description BLOOD RIGHT HAND  Final   Special Requests BOTTLES DRAWN AEROBIC AND ANAEROBIC 8CC  Final   Culture NO GROWTH 1 DAY  Final   Report Status PENDING  Incomplete  Culture, blood (routine x 2)     Status: None (Preliminary result)   Collection Time:   10/06/14  6:15 AM  Result Value Ref Range Status   Specimen Description BLOOD LEFT HAND  Final   Special Requests BOTTLES DRAWN AEROBIC ONLY 8CC  Final   Culture NO GROWTH 1 DAY  Final   Report Status PENDING  Incomplete   Studies/Results: No results found.  Medications: I have reviewed the patient's current medications.  Assesment:   Principal Problem:   Hyperkalemia Active Problems:   Bipolar I  disorder, most recent episode mixed   H/O: stroke   Dehydration   CVA, old, cognitive deficits   CKD (chronic kidney disease) stage 3, GFR 30-59 ml/min   Thrombocytopenia   Acute encephalopathy   Acute renal failure   Hypoglycemia MRSA bacteremia Left jagular thrombus thrombocytobenia  Plan:  Medications reviewed Will increase IV fluid to 125 ml/hr. Continue Iv vancomycin As per  hsematology and neurology recommendations   LOS: 13 days   , 10/08/2014, 8:23 AM   

## 2014-10-08 NOTE — Progress Notes (Signed)
ANTIBIOTIC CONSULT NOTE -   Pharmacy Consult for Daptomycin (Vancomycin d/c'd per ID) Indication: bacteremia  No Known Allergies  Patient Measurements: Height: 5\' 9"  (175.3 cm) Weight: 130 lb 1.1 oz (59 kg) IBW/kg (Calculated) : 70.7  Vital Signs: Temp: 98.6 F (37 C) (02/04 0730) Temp Source: Axillary (02/04 0730) BP: 129/88 mmHg (02/04 1200) Pulse Rate: 92 (02/04 1330) Intake/Output from previous day: 02/03 0701 - 02/04 0700 In: 3915 [P.O.:240; I.V.:3000; IV Piggyback:200] Out: 100 [Urine:100] Intake/Output from this shift: Total I/O In: 675 [I.V.:375; IV Piggyback:300] Out: 225 [Urine:225]  Labs:  Recent Labs  10/06/14 0615 10/07/14 0457 10/07/14 1734 10/08/14 0740 10/08/14 0745  WBC 3.0* 3.7* 5.8 6.2  --   HGB 10.6* 9.9* 10.4* 10.0*  --   PLT 34* 31* 28* 24*  --   CREATININE 1.44* 1.28  --   --  1.22   Estimated Creatinine Clearance: 55.7 mL/min (by C-G formula based on Cr of 1.22).  Recent Labs  10/08/14 0743  Powers 10.3    Microbiology: Recent Results (from the past 720 hour(s))  MRSA PCR Screening     Status: Abnormal   Collection Time: 09/25/14  2:57 PM  Result Value Ref Range Status   MRSA by PCR POSITIVE (A) NEGATIVE Final    Comment:        The GeneXpert MRSA Assay (FDA approved for NASAL specimens only), is one component of a comprehensive MRSA colonization surveillance program. It is not intended to diagnose MRSA infection nor to guide or monitor treatment for MRSA infections. RESULT CALLED TO, READ BACK BY AND VERIFIED WITH: NIELSON,T ON 09/25/14 AT 2105 BY LOY,C   Culture, blood (routine x 2)     Status: None   Collection Time: 10/03/14  9:58 AM  Result Value Ref Range Status   Specimen Description BLOOD RIGHT ANTECUBITAL  Final   Special Requests BOTTLES DRAWN AEROBIC AND ANAEROBIC 6CC  Final   Culture   Final    STAPHYLOCOCCUS AUREUS Note: Gram Stain Report Called to,Read Back By and Verified With: LEE B @0330  ON 10/04/14  BY WOODIE J SUSCEPTIBILITIES PERFORMED ON PREVIOUS CULTURE WITHIN THE LAST 5 DAYS. Performed at [facility]    Report Status 10/06/2014 FINAL  Final  Culture, blood (routine x 2)     Status: None   Collection Time: 10/03/14  9:59 AM  Result Value Ref Range Status   Specimen Description BLOOD RIGHT ARM  Final   Special Requests BOTTLES DRAWN AEROBIC AND ANAEROBIC 8CC  Final   Culture   Final    METHICILLIN RESISTANT STAPHYLOCOCCUS AUREUS Note: Gram Stain Report Called to,Read Back By and Verified With: LEE B @ 0330 ON 10/04/14 BY WOODIEB RIFAMPIN AND GENTAMICIN SHOULD NOT BE USED AS SINGLE DRUGS FOR TREATMENT OF STAPH INFECTIONS. This organism DOES NOT demonstrate inducible Clindamycin  resistance in vitro. CRITICAL RESULT CALLED TO, READ BACK BY AND VERIFIED WITH: Waynard Reeds RN AT [facility] AT [phone] BY CASTC Performed at [facility]    Report Status 10/06/2014 FINAL  Final   Organism ID, Bacteria METHICILLIN RESISTANT STAPHYLOCOCCUS AUREUS  Final      Susceptibility   Methicillin resistant staphylococcus aureus - MIC*    CLINDAMYCIN <=0.25 SENSITIVE Sensitive     ERYTHROMYCIN >=8 RESISTANT Resistant     GENTAMICIN <=0.5 SENSITIVE Sensitive     LEVOFLOXACIN 4 INTERMEDIATE Intermediate     OXACILLIN >=4 RESISTANT Resistant     PENICILLIN >=0.5 RESISTANT Resistant  RIFAMPIN <=0.5 SENSITIVE Sensitive     TRIMETH/SULFA <=10 SENSITIVE Sensitive     VANCOMYCIN <=0.5 SENSITIVE Sensitive     TETRACYCLINE <=1 SENSITIVE Sensitive     * METHICILLIN RESISTANT STAPHYLOCOCCUS AUREUS  Culture, Urine     Status: None   Collection Time: 10/03/14 10:00 AM  Result Value Ref Range Status   Specimen Description URINE, CATHETERIZED  Final   Special Requests NONE  Final   Colony Count NO GROWTH Performed at [facility]   Final   Culture NO GROWTH Performed at [facility]   Final   Report Status 10/04/2014 FINAL  Final  Culture, blood (routine x 2)      Status: None (Preliminary result)   Collection Time: 10/06/14  6:15 AM  Result Value Ref Range Status   Specimen Description BLOOD RIGHT HAND  Final   Special Requests BOTTLES DRAWN AEROBIC AND ANAEROBIC 8CC  Final   Culture NO GROWTH 2 DAYS  Final   Report Status PENDING  Incomplete  Culture, blood (routine x 2)     Status: None (Preliminary result)   Collection Time: 10/06/14  6:15 AM  Result Value Ref Range Status   Specimen Description BLOOD LEFT HAND  Final   Special Requests BOTTLES DRAWN AEROBIC ONLY 8CC  Final   Culture NO GROWTH 2 DAYS  Final   Report Status PENDING  Incomplete   Medical History: Past Medical History  Diagnosis Date  . Aortic dissection   . Hypertension   . CVA (cerebral infarction)   . Aortic dissection   . Hypertension   . CVA (cerebral infarction)   . Diabetes mellitus   . Cardiomyopathy   . Hyperglycemia   . Bipolar 1 disorder   . Hyperkalemia   . Coronary artery disease   . Hyponatremia   . Altered mental status     with psychosis  . Dehydration   . TIA (transient ischemic attack)   . Lack of coordination   . Cardiomyopathy    Anti-infectives    Start     Dose/Rate Route Frequency Ordered Stop   10/09/14 0900  vancomycin (VANCOCIN) 1,500 mg in sodium chloride 0.9 % 500 mL IVPB  Status:  Discontinued     1,500 mg250 mL/hr over 120 Minutes Intravenous Every 24 hours 10/08/14 1039 10/08/14 1351   10/08/14 1800  DAPTOmycin (CUBICIN) 470 mg in sodium chloride 0.9 % IVPB     470 mg218.8 mL/hr over 30 Minutes Intravenous Every 24 hours 10/08/14 1414     10/08/14 1200  vancomycin (VANCOCIN) 500 mg in sodium chloride 0.9 % 100 mL IVPB     500 mg100 mL/hr over 60 Minutes Intravenous  Once 10/08/14 1038 10/08/14 1255   10/06/14 0900  vancomycin (VANCOCIN) IVPB 1000 mg/200 mL premix  Status:  Discontinued     1,000 mg200 mL/hr over 60 Minutes Intravenous Every 24 hours 10/05/14 1105 10/08/14 1039   10/05/14 1500  ceFAZolin (ANCEF) IVPB 2 g/50 mL  premix  Status:  Discontinued     2 g100 mL/hr over 30 Minutes Intravenous 3 times per day 10/05/14 1413 10/06/14 0802   10/04/14 1800  vancomycin (VANCOCIN) IVPB 750 mg/150 ml premix  Status:  Discontinued     750 mg150 mL/hr over 60 Minutes Intravenous Every 12 hours 10/04/14 1005 10/05/14 1104   10/04/14 1300  piperacillin-tazobactam (ZOSYN) IVPB 3.375 g  Status:  Discontinued     3.375 g12.5 mL/hr over 240 Minutes Intravenous Every 8 hours 10/04/14 1003  10/05/14 1411   10/04/14 1100  vancomycin (VANCOCIN) 500 mg in sodium chloride 0.9 % 100 mL IVPB     500 mg100 mL/hr over 60 Minutes Intravenous NOW 10/04/14 1007 10/04/14 1201   10/04/14 0345  vancomycin (VANCOCIN) 500 mg in sodium chloride 0.9 % 100 mL IVPB     500 mg100 mL/hr over 60 Minutes Intravenous  Once 10/04/14 0335 10/04/14 0527   10/04/14 0345  piperacillin-tazobactam (ZOSYN) IVPB 3.375 g     3.375 g12.5 mL/hr over 240 Minutes Intravenous  Once 10/04/14 0337 10/04/14 0826     Assessment: 18 yoM with MRSA bacteremia likely from infected line.  Repeat blood cx pending.  He has remained afebrile for past 24hrs.  Vancomycin trough level is below goal. Acute on chronic renal insufficieny on admission noted.  Since admission Scr has fluctuated 1.21-1.76.  24hr I/O +3879ml  Vancomycin 1/31>>2/4 Daptomycin 2/4 >>  Thank you for the consult, will continue to follow.  Goal of Therapy:  Eradicate infection.  Plan:   Daptomycin 8mg /Kg IV q24hrs  Duration of therapy per ID recommendations  F/U repeat blood cx, renal fxn, TEE results  Hart Robinsons A 10/08/2014,2:19 PM

## 2014-10-08 NOTE — Care Management Utilization Note (Signed)
UR completed 

## 2014-10-09 LAB — EBV AB TO VIRAL CAPSID AG PNL, IGG+IGM
EBV VCA IgG: <10 U/mL (ref <18.0)
EBV VCA IgM: <10 U/mL (ref <36.0)

## 2014-10-09 LAB — GLUCOSE, CAPILLARY
GLUCOSE-CAPILLARY: 147 mg/dL — AB (ref 70–99)
GLUCOSE-CAPILLARY: 123 mg/dL — AB (ref 70–99)
Glucose-Capillary: 162 mg/dL — ABNORMAL HIGH (ref 70–99)
Glucose-Capillary: 169 mg/dL — ABNORMAL HIGH (ref 70–99)

## 2014-10-09 LAB — CBC WITH DIFFERENTIAL/PLATELET
BASOS ABS: 0 10*3/uL (ref 0.0–0.1)
BASOS PCT: 0 % (ref 0–1)
Eosinophils Absolute: 0.1 10*3/uL (ref 0.0–0.7)
Eosinophils Relative: 1 % (ref 0–5)
HEMATOCRIT: 28.8 % — AB (ref 39.0–52.0)
Hemoglobin: 9.7 g/dL — ABNORMAL LOW (ref 13.0–17.0)
Lymphocytes Relative: 18 % (ref 12–46)
Lymphs Abs: 1.4 10*3/uL (ref 0.7–4.0)
MCH: 29.7 pg (ref 26.0–34.0)
MCHC: 33.7 g/dL (ref 30.0–36.0)
MCV: 88.1 fL (ref 78.0–100.0)
MONO ABS: 1.1 10*3/uL — AB (ref 0.1–1.0)
Monocytes Relative: 14 % — ABNORMAL HIGH (ref 3–12)
NEUTROS ABS: 5.2 10*3/uL (ref 1.7–7.7)
NEUTROS PCT: 67 % (ref 43–77)
Platelets: 30 10*3/uL — ABNORMAL LOW (ref 150–400)
RBC: 3.27 MIL/uL — ABNORMAL LOW (ref 4.22–5.81)
RDW: 12.7 % (ref 11.5–15.5)
WBC: 7.8 10*3/uL (ref 4.0–10.5)

## 2014-10-09 LAB — BASIC METABOLIC PANEL
BUN: 28 mg/dL — AB (ref 6–23)
CHLORIDE: 109 mmol/L (ref 96–112)
CO2: 26 mmol/L (ref 19–32)
Calcium: 7.4 mg/dL — ABNORMAL LOW (ref 8.4–10.5)
Creatinine, Ser: 1.17 mg/dL (ref 0.50–1.35)
GFR, EST AFRICAN AMERICAN: 78 mL/min — AB (ref >90)
GFR, EST NON AFRICAN AMERICAN: 68 mL/min — AB (ref >90)
Glucose, Bld: 166 mg/dL — ABNORMAL HIGH (ref 70–99)
POTASSIUM: 3 mmol/L — AB (ref 3.5–5.1)
Sodium: 137 mmol/L (ref 135–145)

## 2014-10-09 LAB — HEPATITIS PANEL, ACUTE
HCV Ab: NEGATIVE
HEP B C IGM: NONREACTIVE
HEP B S AG: NEGATIVE
Hep A IgM: NONREACTIVE

## 2014-10-09 LAB — HELICOBACTER PYLORI ABS-IGG+IGA, BLD: H Pylori IgA: <9 units (ref 0.0–8.9)

## 2014-10-09 LAB — SEDIMENTATION RATE: SED RATE: 15 mm/h (ref 0–16)

## 2014-10-09 LAB — C-REACTIVE PROTEIN: CRP: 9 mg/dL — AB (ref <0.60)

## 2014-10-09 MED ORDER — POTASSIUM CHLORIDE 10 MEQ/100ML IV SOLN
10.0000 meq | INTRAVENOUS | Status: AC
  Administered 2014-10-09 (×4): 10 meq via INTRAVENOUS
  Filled 2014-10-09: qty 100

## 2014-10-09 MED ORDER — CHLORPROMAZINE HCL 25 MG/ML IJ SOLN: 50.0000 mg | Freq: Three times a day (TID) | INTRAMUSCULAR | Status: DC | PRN

## 2014-10-09 MED ORDER — CHLORPROMAZINE HCL 25 MG/ML IJ SOLN
50.0000 mg | Freq: Three times a day (TID) | INTRAMUSCULAR | Status: DC | PRN
  Filled 2014-10-09: qty 2

## 2014-10-09 MED ORDER — HALOPERIDOL LACTATE 5 MG/ML IJ SOLN
10.0000 mg | Freq: Four times a day (QID) | INTRAMUSCULAR | Status: DC
  Administered 2014-10-10 – 2014-10-13 (×14): 10 mg via INTRAMUSCULAR
  Filled 2014-10-09 (×14): qty 2

## 2014-10-09 MED ORDER — SULFAMETHOXAZOLE-TRIMETHOPRIM 400-80 MG/5ML IV SOLN
240.0000 mg | Freq: Four times a day (QID) | INTRAVENOUS | Status: DC
  Administered 2014-10-09 – 2014-10-12 (×11): 240 mg via INTRAVENOUS
  Filled 2014-10-09 (×13): qty 15

## 2014-10-09 MED ORDER — GLUCERNA SHAKE PO LIQD
237.0000 mL | Freq: Two times a day (BID) | ORAL | Status: DC
Start: 2014-10-10 — End: 2014-10-13
  Administered 2014-10-10 – 2014-10-13 (×2): 237 mL via ORAL

## 2014-10-09 NOTE — Progress Notes (Signed)
ANTIBIOTIC CONSULT NOTE -   Pharmacy Consult for Bactrim Indication: MRSA bacteremia  No Known Allergies  Patient Measurements: Height: 5\' 9"  (175.3 cm) Weight: 130 lb 1.1 oz (59 kg) IBW/kg (Calculated) : 70.7  Vital Signs: Temp: 98.3 F (36.8 C) (02/05 0745) Temp Source: Axillary (02/05 0745) BP: 147/92 mmHg (02/05 0900) Pulse Rate: 94 (02/05 0900) Intake/Output from previous day: 02/04 0701 - 02/05 0700 In: 3555 [P.O.:90; I.V.:3000; IV Piggyback:465] Out: 1250 [Urine:1250] Intake/Output from this shift: Total I/O In: 325 [I.V.:125; IV Piggyback:200] Out: -   Labs:  Recent Labs  10/07/14 0457  10/08/14 0740 10/08/14 0745 10/08/14 1903 10/09/14 0600  WBC 3.7*  < > 6.2  --  7.3 7.8  HGB 9.9*  < > 10.0*  --  9.4* 9.7*  PLT 31*  < > 24*  --  27* 30*  CREATININE 1.28  --   --  1.22  --  1.17  < > = values in this interval not displayed. Estimated Creatinine Clearance: 58.1 mL/min (by C-G formula based on Cr of 1.17).  Recent Labs  10/08/14 0743  [place] 10.3    Microbiology: Recent Results (from the past 720 hour(s))  MRSA PCR Screening     Status: Abnormal   Collection Time: 09/25/14  2:57 PM  Result Value Ref Range Status   MRSA by PCR POSITIVE (A) NEGATIVE Final    Comment:        The GeneXpert MRSA Assay (FDA approved for NASAL specimens only), is one component of a comprehensive MRSA colonization surveillance program. It is not intended to diagnose MRSA infection nor to guide or monitor treatment for MRSA infections. RESULT CALLED TO, READ BACK BY AND VERIFIED WITH: NIELSON,T ON 09/25/14 AT 2105 BY LOY,C   Culture, blood (routine x 2)     Status: None   Collection Time: 10/03/14  9:58 AM  Result Value Ref Range Status   Specimen Description BLOOD RIGHT ANTECUBITAL  Final   Special Requests BOTTLES DRAWN AEROBIC AND ANAEROBIC 6CC  Final   Culture   Final    STAPHYLOCOCCUS AUREUS Note: Gram Stain Report Called to,Read Back By and Verified  With: LEE B @0330  ON 10/04/14 BY WOODIE J SUSCEPTIBILITIES PERFORMED ON PREVIOUS CULTURE WITHIN THE LAST 5 DAYS. Performed at [facility]    Report Status 10/06/2014 FINAL  Final  Culture, blood (routine x 2)     Status: None   Collection Time: 10/03/14  9:59 AM  Result Value Ref Range Status   Specimen Description BLOOD RIGHT ARM  Final   Special Requests BOTTLES DRAWN AEROBIC AND ANAEROBIC 8CC  Final   Culture   Final    METHICILLIN RESISTANT STAPHYLOCOCCUS AUREUS Note: Gram Stain Report Called to,Read Back By and Verified With: LEE B @ 0330 ON 10/04/14 BY WOODIEB RIFAMPIN AND GENTAMICIN SHOULD NOT BE USED AS SINGLE DRUGS FOR TREATMENT OF STAPH INFECTIONS. This organism DOES NOT demonstrate inducible Clindamycin  resistance in vitro. CRITICAL RESULT CALLED TO, READ BACK BY AND VERIFIED WITH: Waynard Reeds RN AT [facility] AT [phone] BY CASTC Performed at [facility]    Report Status 10/06/2014 FINAL  Final   Organism ID, Bacteria METHICILLIN RESISTANT STAPHYLOCOCCUS AUREUS  Final      Susceptibility   Methicillin resistant staphylococcus aureus - MIC*    CLINDAMYCIN <=0.25 SENSITIVE Sensitive     ERYTHROMYCIN >=8 RESISTANT Resistant     GENTAMICIN <=0.5 SENSITIVE Sensitive     LEVOFLOXACIN 4 INTERMEDIATE Intermediate  OXACILLIN >=4 RESISTANT Resistant     PENICILLIN >=0.5 RESISTANT Resistant     RIFAMPIN <=0.5 SENSITIVE Sensitive     TRIMETH/SULFA <=10 SENSITIVE Sensitive     VANCOMYCIN <=0.5 SENSITIVE Sensitive     TETRACYCLINE <=1 SENSITIVE Sensitive     * METHICILLIN RESISTANT STAPHYLOCOCCUS AUREUS  Culture, Urine     Status: None   Collection Time: 10/03/14 10:00 AM  Result Value Ref Range Status   Specimen Description URINE, CATHETERIZED  Final   Special Requests NONE  Final   Colony Count NO GROWTH Performed at [facility]   Final   Culture NO GROWTH Performed at [facility]   Final   Report Status 10/04/2014 FINAL  Final   Culture, blood (routine x 2)     Status: None (Preliminary result)   Collection Time: 10/06/14  6:15 AM  Result Value Ref Range Status   Specimen Description BLOOD RIGHT HAND  Final   Special Requests BOTTLES DRAWN AEROBIC AND ANAEROBIC 8CC  Final   Culture NO GROWTH 2 DAYS  Final   Report Status PENDING  Incomplete  Culture, blood (routine x 2)     Status: None (Preliminary result)   Collection Time: 10/06/14  6:15 AM  Result Value Ref Range Status   Specimen Description BLOOD LEFT HAND  Final   Special Requests BOTTLES DRAWN AEROBIC ONLY 8CC  Final   Culture NO GROWTH 2 DAYS  Final   Report Status PENDING  Incomplete   Medical History: Past Medical History  Diagnosis Date  . Aortic dissection   . Hypertension   . CVA (cerebral infarction)   . Aortic dissection   . Hypertension   . CVA (cerebral infarction)   . Diabetes mellitus   . Cardiomyopathy   . Hyperglycemia   . Bipolar 1 disorder   . Hyperkalemia   . Coronary artery disease   . Hyponatremia   . Altered mental status     with psychosis  . Dehydration   . TIA (transient ischemic attack)   . Lack of coordination   . Cardiomyopathy    Anti-infectives    Start     Dose/Rate Route Frequency Ordered Stop   10/09/14 1200  sulfamethoxazole-trimethoprim (BACTRIM) 240 mg of trimethoprim in dextrose 5 % 250 mL IVPB     240 mg of trimethoprim265 mL/hr over 60 Minutes Intravenous 4 times per day 10/09/14 1052     10/09/14 0900  vancomycin (VANCOCIN) 1,500 mg in sodium chloride 0.9 % 500 mL IVPB  Status:  Discontinued     1,500 mg250 mL/hr over 120 Minutes Intravenous Every 24 hours 10/08/14 1039 10/08/14 1351   10/08/14 1800  DAPTOmycin (CUBICIN) 470 mg in sodium chloride 0.9 % IVPB  Status:  Discontinued     470 mg218.8 mL/hr over 30 Minutes Intravenous Every 24 hours 10/08/14 1414 10/09/14 0958   10/08/14 1200  vancomycin (VANCOCIN) 500 mg in sodium chloride 0.9 % 100 mL IVPB     500 mg100 mL/hr over 60 Minutes  Intravenous  Once 10/08/14 1038 10/08/14 1255   10/06/14 0900  vancomycin (VANCOCIN) IVPB 1000 mg/200 mL premix  Status:  Discontinued     1,000 mg200 mL/hr over 60 Minutes Intravenous Every 24 hours 10/05/14 1105 10/08/14 1039   10/05/14 1500  ceFAZolin (ANCEF) IVPB 2 g/50 mL premix  Status:  Discontinued     2 g100 mL/hr over 30 Minutes Intravenous 3 times per day 10/05/14 1413 10/06/14 0802   10/04/14 1800  vancomycin (VANCOCIN) IVPB 750 mg/150 ml premix  Status:  Discontinued     750 mg150 mL/hr over 60 Minutes Intravenous Every 12 hours 10/04/14 1005 10/05/14 1104   10/04/14 1300  piperacillin-tazobactam (ZOSYN) IVPB 3.375 g  Status:  Discontinued     3.375 g12.5 mL/hr over 240 Minutes Intravenous Every 8 hours 10/04/14 1003 10/05/14 1411   10/04/14 1100  vancomycin (VANCOCIN) 500 mg in sodium chloride 0.9 % 100 mL IVPB     500 mg100 mL/hr over 60 Minutes Intravenous NOW 10/04/14 1007 10/04/14 1201   10/04/14 0345  vancomycin (VANCOCIN) 500 mg in sodium chloride 0.9 % 100 mL IVPB     500 mg100 mL/hr over 60 Minutes Intravenous  Once 10/04/14 0335 10/04/14 0527   10/04/14 0345  piperacillin-tazobactam (ZOSYN) IVPB 3.375 g     3.375 g12.5 mL/hr over 240 Minutes Intravenous  Once 10/04/14 0337 10/04/14 0826     Assessment: 46 yoM with MRSA bacteremia likely from infected line.  Repeat blood cx NGTD.   He remains afebrile with normal WBC.   Acute on chronic renal insufficieny on admission noted.  Since admission Scr has fluctuated, but has been trending down since 2/1. Platelet count with mild increase today after Vancomycin stopped.  Baseline CK elevated so daptomycin discontinued.  Asked to change antibiotics to IV Bactrim. TEE pending.   Vancomycin 1/31>>2/4 Daptomycin 2/4 >>2/5 IV Bactrim 2/5>>  Goal of Therapy:  Eradicate infection.  Plan:   Bactrim 15mg /Kg (of TMP) IV q6hrs  Duration of therapy per ID recommendations-anticipate 4-6 weeks pending TEE results  F/U repeat  blood cx, renal fxn, CBC, TEE results  Nathania Waldman, [place] Drafts 10/09/2014,10:52 AM

## 2014-10-09 NOTE — Progress Notes (Signed)
Report called to [facility]. Patient transferred to department 300 via bed with RN and sitter.

## 2014-10-09 NOTE — Progress Notes (Signed)
Patient ID: Derrick Butler, male   DOB: 21-Jun-1956, 59 y.o.   MRN: [id number]  Firth A. [name], MD     [email or web address]          Derrick Butler is an 59 y.o. male.   Assessment/Plan:  1. Toxic metabolic encephalopathy most likely due to acute renal failure. Dehydration and medication effect compounded by reduced clearance from acute renal impairment are also contributing factors. Also bacteremia now.   2. Agitated confusion and combativeness. Unable to do EEG. We will discontinue since unsuccessful after 2 attempts. The patient currently is on Depakote for behavioral problems but this should also cover for any seizures.  3. Baseline history of dyskinesia. No evidence of tardive dyskinesia or parkinsonism at this time. We will make adjustments in his medications. Because of limited by mouth intake, his Depakote was switched to IV. The haldol will also be switched IM. Will increase haldol.  4. Baseline vascular dementia.  5. 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  6. Fever with MRSA. ECHO --P  7. Thrombocytopenia - suspect multifactorial. I reduced depakote - which can cause dose dependent thrombocytopenia  8. Anemia.  9. Hyperglycemia.   10. Elevated CPK. Exact etiology unknown but could be related to the patient's vigorous movements. Clinically does not appear to have a myopathy. We will repeat the CPK.  11. POOR ORAL INTAKE. Given the poor oral intake and his obviously high caloric intake, the patient will need another source of nutrition. A NG tube or PEG is recommended.   Still with restlessness and a lot dyskenetic movements. Haldol helps however.  Virtually no PO intake. Unable to take pills for the most part.    GENERAL: Improved.   HEENT: Supple. Atraumatic normocephalic.   ABDOMEN: soft  EXTREMITIES: No edema. There are bandages/dressing seen involving multiple areas of the legs.   BACK: Normal.  SKIN: Normal by  inspection.   MENTAL STATUS: The patient is very active today. His moving around quite a bit. He does talk in full 3 word sentences and is comprehensible and appropriate at times but at other times nonsensical.  CRANIAL NERVES: Pupils are large 6 mm but reactive. Initially pupils were deviated downwards. After the patient woke up, he has full extraocular movements. Coronary reflexes are intact. The patient seemed to not to respond to direct threat bilaterally suggestive of possible visual field impairment. Facial muscle strength symmetric.  MOTOR: Bulk and tone are good. The patient moves both sides vigorously.  COORDINATION: No tremors, dystonia or dysmetria noted. No rigidity, bradykinesia or parkinsonism observed.   SENSATION: Normal to deep painful stimuli.      MY OFFICE  CONSULT NOTE [phone]  [[[[[[[[[[[[[[[[[[[[[[[[[[[[[[[[[[[[[[[[[[[[[[[[[[[[[[Patient has been referred to our office by [facility] for tremors and other involuntary movements.  Patient's caregiver states that patient moves all the time and sometimes his movements are worse.  Patient states that he has moved his head all of his life.  Derrick Butler    This is a 43 year many is referred for tremors. History is extremely difficult as he has obvious cognitive impairment at baseline. It is unclear if this is static process or represents a deterioration in a otherwise functional baseline. From what I can ascertain, this may be a static problem. The patient appears to have been in institutional facilities for a long time. He has been on the current facility for last 6 months. The care provider tells me that he has had these tremors ever since  then. Again, there is very little data available to analyze. The patient was recently hospitalized but to 3 weeks ago for medical problems was noted to have altered mental status and falls. Workup including labs and head CT scan. Head CT scan is outlined below and is significant for old  cortical infarct involving the right occipital area along with several other lacunar infarcts. Nothing acute is seen. The care provider tells me that he is had the tremors ever since he has been at the facility. The patient tells [place] that he has had this for a long time ever since he was a child but this does not seem likely. After much to do and didn't even after the patient left we were getting history from them. It appears that he actually seen the psychiatrist for his schizophrenia. They made adjustments in his medication last week and when he was seen but so far these adjustments have not been made. It turns out that he actually was supposed to stop the respirdal and an surgical was started. Additionally, the Depakote was slated to be increased to the same level which we had suggested below.  PAST MEDICAL HISTORY: Cardiomyopathy ICD#425.4  Renal insufficiency   ICD#586  Unspecified affective psychosis   ICD#296.90  Insomnia   ICD#780.52  Hyponatremia NOS   ICD#276.1  Coronary atherosclerosis Of unspecified type of vessel, native or graft   ICD#414.00  Weakness   ICD#728.87  TIA [Transient ischemic attack]   ICD#435.9  Dystonia   ICD#333.79  Hypertension   ICD#401.9  Mental status change   ICD#780.97  Hyperpotassemia Dissecting Aortic Aneurysm Abnormal Glucose Lack of Coordination            Diabetes - Type 2   ICD#250.00  Hyperlipidemia   ICD#272.4   MEDICATIONS: Temazepam: 15 mg capsule SIG-   qhs    Labetalol (Normodyne):   200 mg tablet  SIG-  1 each   2 times a day   Baclofen (Lioresal):   10 mg tablet  SIG-  1 each    3 times a day    Benztropine Mesylate: 1 mg tablet SIG-   tid    Depakote ER: 250 mg tablet, extended release SIG-  tid    RisperiDONE: 1 mg tablet SIG-   tid    Aspirin: 325 mg tablet SIG-   daily    Clonidine (Catapres):   0.3 mg tablet  daily Flonase (Fluticasone) Nasal Spray:   0.05 mg/inh (spray)  SIG-  2 inhalation  in each nostril    once daily      Glyburide (Diabeta, Micronase):   5 mg tablet  SIG-  1 each   once a day   K-Dur 20:  20 mEq tablet, extended release  SIG-  1 tab(s)    daily Altace  (Ramipril):   10 mg tablet  SIG-  1 each    once daily     Simvastatin (Zocor):   40 mg tablet  SIG-  1 each   once a day  ALLERGY:  Drug Allergies.  No Known.  SURGERIES: Unknown  FAMILY HISTORY: Family History - 1st Degree Relatives:  Father is dead.  Mother dead.    Alzheimer's disease   ICD#331.0  Onset: 09/22/2013 11:53  -mother  TOBACCO HISTORY: Smoker Status:   Never smoker   ICD#V13.89  ALCOHOL HISTORY: Denies  SOCIAL HISTORY: Single.    [facility] notes He lives in [place] with his sister.  He has  no children.  He has never been married.  He is disabled.  He is a prior  worker in a [employer].  REVIEW OF SYSTEMS:   OBJECTIVE DATA: VS: BMI: 23.1.  BP: 144/69.  H: 69.00 in.  P: 64 /min.  W: 156lbs 0oz.     GENERAL: This is a thin man who is in a wheelchair. He does ambulate at baseline per the care provider but for reasons unknown he is no wheelchair.  HEENT: Supple. Atraumatic normocephalic. The patient has continuous vertical dyskinetic head movements along with rotational and horizontal movements. There is also movements of the tongue. The patient is quite restless. This restlessness is essentially continuous.  ABDOMEN: soft  EXTREMITIES: No edema   BACK: Normal.  SKIN: Normal by inspection.    MENTAL STATUS: The patient is awake and alert. He cooperates with evaluation. His speech is severely dysarthric although he does follow commands briskly. Communication is severely reduced/impaired. He is oriented 1.  CRANIAL NERVES: Pupils are equal, round and reactive to light and accommodation; extra ocular movements are full, there is no significant  nystagmus; visual fields are full; upper and lower facial muscles are normal in strength and symmetric, there is no flattening of the nasolabial  folds; tongue is midline; uvula is midline; shoulder elevation is normal.  MOTOR: Normal tone, bulk and strength; no pronator drift.  COORDINATION: Continuous movements and restlessness of the extremities. No rigidity, cogwheeling or bradykinesia.  REFLEXES: Deep tendon reflexes are symmetrical and normal. Babinski reflexes are flexor bilaterally.   SENSATION: Normal to light touch.  GAIT: The patient is in a wheelchair. He can stand with little assistance and has decent strides.   Hospital labs couple weeks ago as follows: Depakote level is reviewed and has always been subtherapeutic for the last 3 years. The most recent 2 weeks ago was 14.8. The least was undetected and highest several months ago was 72. ESR 62, RPR nonreactive, normal TSH and vitamin B12 596.  HEAD CT 09-2013 CLINICAL DATA:  Altered mental status, fall, patient is unresponsive   EXAM: CT HEAD WITHOUT CONTRAST   TECHNIQUE: Contiguous axial images were obtained from the base of the skull through the vertex without intravenous contrast.   COMPARISON:  06/14/2013   FINDINGS: Extensive debris within the nasal passages bilaterally. Opacification of the right frontal sinuses an inferior anterior right ethmoid air cells. Sphenoid and maxillary sinuses appear clear. Mastoid air cells are clear. No evidence of fracture involving the skull.   There is moderate diffuse atrophy. There is focal low attenuation in the right occipital lobe. This is stable when compared to the prior study and is consistent with encephalomalacia related to prior infarct. There is mild scattered white matter disease in the periventricular white matter bilaterally which is stable. There is evidence of prior left basal ganglia lacunar infarcts and possibly  tiny lacunar infarct on the right just above the basal ganglia. These findings are unchanged. There is no hemorrhage or extra-axial fluid. There is no evidence of acute vascular territory  infarct.   Low attenuation in the anterior inferior right cerebellum suggests prior infarct. This is also stable.   IMPRESSION: Tremor   ICD#781.0  Onset: 09/22/2013 11:49  - Neuroleptic induced.   Previous CVA (stroke) with residual effects   ICD#438.9  Onset: 09/22/2013 11:49   Encephalopathy not elsewhere classified   ICD#348.39  Onset: 09/22/2013 11:55  PLAN: 1. Reduce risperidone to 34m/day. 2. DC Depakote ER 3. DC Seroquel  4. Start Depakote Sprinkles 1230m  4 bid   FOLLOW UP: 4 weeks]]]]]]]]]]]]]]]]]]]]]]]]]]]]]]]]]]]]]]]]]]]]]]]]]]]]]]]]]]]]]]]]]]]]]]]]]]]]]]]]]]]]]]]]]]]]]]]]]]]]  Objective: Vital signs in last 24 hours: Temp:  [97.7 F (36.5 C)-98.6 F (37 C)] 98.4 F (36.9 C) (02/05 1147) Pulse Rate:  [84-105] 84 (02/05 1300) Resp:  [16-29] 28 (02/05 1300) BP: (126-169)/(49-136) 131/90 mmHg (02/05 1300) SpO2:  [91 %-100 %] 98 % (02/05 1300)  Intake/Output from previous day: 02/04 0701 - 02/05 0700 In: 3555 [P.O.:90; I.V.:3000; IV Piggyback:465] Out: 1250 [Urine:1250] Intake/Output this shift: Total I/O In: 1705 [P.O.:360; I.V.:850; IV Piggyback:495] Out: -  Nutritional status: Diet Carb Modified   Lab Results: Results for orders placed or performed during the hospital encounter of 09/25/14 (from the past 48 hour(s))  Vitamin B12     Status: None   Collection Time: 10/07/14  5:34 PM  Result Value Ref Range   Vitamin B-12 630 211 - 911 pg/mL    Comment: Performed at [facility]  Folate     Status: None   Collection Time: 10/07/14  5:34 PM  Result Value Ref Range   Folate 8.8 ng/mL    Comment: (NOTE) Reference Ranges        Deficient:       0.4 - 3.3 ng/mL        Indeterminate:   3.4 - 5.4 ng/mL        Normal:              > 5.4 ng/mL Performed at [facility]   Iron and TIBC     Status: Abnormal   Collection Time: 10/07/14  5:34 PM  Result Value Ref Range   Iron <10 (L) 42 - 165 ug/dL   TIBC Not calculated due to Iron <10. 215  - 435 ug/dL   Saturation Ratios Not calculated due to Iron <10. 20 - 55 %   UIBC 126 125 - 400 ug/dL    Comment: Performed at [facility]  Ferritin     Status: Abnormal   Collection Time: 10/07/14  5:34 PM  Result Value Ref Range   Ferritin 849 (H) 22 - 322 ng/mL    Comment: Performed at [facility]  Reticulocytes     Status: Abnormal   Collection Time: 10/07/14  5:34 PM  Result Value Ref Range   Retic Ct Pct 0.4 0.4 - 3.1 %   RBC. 3.53 (L) 4.22 - 5.81 MIL/uL   Retic Count, Manual 14.1 (L) 19.0 - 186.0 K/uL  CBC     Status: Abnormal   Collection Time: 10/07/14  5:34 PM  Result Value Ref Range   WBC 5.8 4.0 - 10.5 K/uL   RBC 3.53 (L) 4.22 - 5.81 MIL/uL   Hemoglobin 10.4 (L) 13.0 - 17.0 g/dL   HCT 31.2 (L) 39.0 - 52.0 %   MCV 88.4 78.0 - 100.0 fL   MCH 29.5 26.0 - 34.0 pg   MCHC 33.3 30.0 - 36.0 g/dL   RDW 12.8 11.5 - 15.5 %   Platelets 28 (LL) 150 - 400 K/uL    Comment: DELTA CHECK NOTED RESULT REPEATED AND VERIFIED CRITICAL RESULT CALLED TO, READ BACK BY AND VERIFIED WITH: PHILLIPS,S AT 1815 ON 10/07/2014 BY ISLEY,B   Glucose, capillary     Status: Abnormal   Collection Time: 10/07/14  9:29 PM  Result Value Ref Range   Glucose-Capillary 114 (H) 70 - 99 mg/dL  CBC with Differential     Status: Abnormal   Collection Time: 10/08/14  7:40 AM  Result Value Ref Range  WBC 6.2 4.0 - 10.5 K/uL   RBC 3.37 (L) 4.22 - 5.81 MIL/uL   Hemoglobin 10.0 (L) 13.0 - 17.0 g/dL   HCT 29.6 (L) 39.0 - 52.0 %   MCV 87.8 78.0 - 100.0 fL   MCH 29.7 26.0 - 34.0 pg   MCHC 33.8 30.0 - 36.0 g/dL   RDW 12.7 11.5 - 15.5 %   Platelets 24 (LL) 150 - 400 K/uL    Comment: RESULT REPEATED AND VERIFIED CRITICAL RESULT CALLED TO, READ BACK BY AND VERIFIED WITH:  NEDINE ROWE RN ON 2 4 2016 AT 0806 BY MURIKA WOODS    Neutrophils Relative % 77 43 - 77 %   Neutro Abs 4.8 1.7 - 7.7 K/uL   Lymphocytes Relative 13 12 - 46 %   Lymphs Abs 0.8 0.7 - 4.0 K/uL   Monocytes Relative 9 3 - 12 %    Monocytes Absolute 0.6 0.1 - 1.0 K/uL   Eosinophils Relative 1 0 - 5 %   Eosinophils Absolute 0.0 0.0 - 0.7 K/uL   Basophils Relative 0 0 - 1 %   Basophils Absolute 0.0 0.0 - 0.1 K/uL  Vancomycin, trough     Status: None   Collection Time: 10/08/14  7:43 AM  Result Value Ref Range   Vancomycin Tr 10.3 10.0 - 20.0 ug/mL  Basic metabolic panel     Status: Abnormal   Collection Time: 10/08/14  7:45 AM  Result Value Ref Range   Sodium 138 135 - 145 mmol/L   Potassium 3.1 (L) 3.5 - 5.1 mmol/L   Chloride 107 96 - 112 mmol/L   CO2 27 19 - 32 mmol/L   Glucose, Bld 191 (H) 70 - 99 mg/dL   BUN 34 (H) 6 - 23 mg/dL   Creatinine, Ser 1.22 0.50 - 1.35 mg/dL   Calcium 7.7 (L) 8.4 - 10.5 mg/dL   GFR calc non Af Amer 64 (L) >90 mL/min   GFR calc Af Amer 74 (L) >90 mL/min    Comment: (NOTE) The eGFR has been calculated using the CKD EPI equation. This calculation has not been validated in all clinical situations. eGFR's persistently <90 mL/min signify possible Chronic Kidney Disease.    Anion gap 4 (L) 5 - 15  Glucose, capillary     Status: Abnormal   Collection Time: 10/08/14  7:56 AM  Result Value Ref Range   Glucose-Capillary 186 (H) 70 - 99 mg/dL   Comment 1 Documented in Chart    Comment 2 Notify RN   EBV ab to viral capsid ag pnl, IgG+IgM     Status: None   Collection Time: 10/08/14 11:03 AM  Result Value Ref Range   EBV VCA IgG <10.0 <18.0 U/mL    Comment: (NOTE) Reference Range:       <18.0 U/mL = Negative                   18.0-21.9 U/mL = Equivocal                      >=22.0 U/mL = Positive    EBV VCA IgM <10.0 <36.0 U/mL    Comment: (NOTE) Reference Range:       <36.0 U/mL = Negative                   36.0-43.9 U/mL = Equivocal                      >=  44.0 U/mL = Positive       Clinical Stage            VCA IgG   VCA IgM      EA    EBV NA       Susceptibility               -         -         -       -       Very Early Infection        +/-       +/-        -       -        Established Infection        +         +        +/-      -       Recent Infection             +         +        +/-     +/-       Past Infection               +         -        +/-      +                                                                             +/- means positive or negative (not weak) High persisting antibody levels may be present in Burkitt's lymphoma and nasopharyngeal carcinoma. Performed at [facility]   Glucose, capillary     Status: Abnormal   Collection Time: 10/08/14 11:40 AM  Result Value Ref Range   Glucose-Capillary 152 (H) 70 - 99 mg/dL  Lactate dehydrogenase     Status: Abnormal   Collection Time: 10/08/14  3:01 PM  Result Value Ref Range   LDH 332 (H) 94 - 250 U/L  CK     Status: Abnormal   Collection Time: 10/08/14  3:04 PM  Result Value Ref Range   Total CK 1152 (H) 7 - 232 U/L  Glucose, capillary     Status: Abnormal   Collection Time: 10/08/14  4:56 PM  Result Value Ref Range   Glucose-Capillary 167 (H) 70 - 99 mg/dL  CBC     Status: Abnormal   Collection Time: 10/08/14  7:03 PM  Result Value Ref Range   WBC 7.3 4.0 - 10.5 K/uL   RBC 3.15 (L) 4.22 - 5.81 MIL/uL   Hemoglobin 9.4 (L) 13.0 - 17.0 g/dL   HCT 27.7 (L) 39.0 - 52.0 %   MCV 87.9 78.0 - 100.0 fL   MCH 29.8 26.0 - 34.0 pg   MCHC 33.9 30.0 - 36.0 g/dL   RDW 12.7 11.5 - 15.5 %   Platelets 27 (LL) 150 - 400 K/uL    Comment: PLATELETS APPEAR DECREASED RESULT REPEATED AND VERIFIED DELTA CHECK NOTED CRITICAL RESULT CALLED TO, READ BACK BY AND VERIFIED  WITH: BREWER,D AT 1930 ON 10/08/2014 BY ISLEY,B   Glucose, capillary     Status: None   Collection Time: 10/08/14  9:01 PM  Result Value Ref Range   Glucose-Capillary 99 70 - 99 mg/dL  Sedimentation rate     Status: None   Collection Time: 10/09/14  6:00 AM  Result Value Ref Range   Sed Rate 15 0 - 16 mm/hr  C-reactive protein     Status: Abnormal   Collection Time: 10/09/14  6:00 AM  Result Value Ref Range   CRP 9.0  (H) <0.60 mg/dL    Comment: Performed at [facility]  Hepatitis panel, acute     Status: None   Collection Time: 10/09/14  6:00 AM  Result Value Ref Range   Hepatitis B Surface Ag NEGATIVE NEGATIVE   HCV Ab NEGATIVE NEGATIVE   Hep A IgM NON REACTIVE NON REACTIVE    Comment: (NOTE) Effective July 20, 2014, Hepatitis Acute Panel (test code [phone]) will be revised to automatically reflex to the Hepatitis C Viral RNA, Quantitative, Real-Time PCR assay if the Hepatitis C antibody screening result is Reactive. This action is being taken to ensure that the CDC/USPSTF recommended HCV diagnostic algorithm with the appropriate test reflex needed for accurate interpretation is followed.    Hep B C IgM NON REACTIVE NON REACTIVE    Comment: (NOTE) High levels of Hepatitis B Core IgM antibody are detectable during the acute stage of Hepatitis B. This antibody is used to differentiate current from past HBV infection. Performed at [facility] metabolic panel     Status: Abnormal   Collection Time: 10/09/14  6:00 AM  Result Value Ref Range   Sodium 137 135 - 145 mmol/L   Potassium 3.0 (L) 3.5 - 5.1 mmol/L   Chloride 109 96 - 112 mmol/L   CO2 26 19 - 32 mmol/L   Glucose, Bld 166 (H) 70 - 99 mg/dL   BUN 28 (H) 6 - 23 mg/dL   Creatinine, Ser 1.17 0.50 - 1.35 mg/dL   Calcium 7.4 (L) 8.4 - 10.5 mg/dL   GFR calc non Af Amer 68 (L) >90 mL/min   GFR calc Af Amer 78 (L) >90 mL/min    Comment: (NOTE) The eGFR has been calculated using the CKD EPI equation. This calculation has not been validated in all clinical situations. eGFR's persistently <90 mL/min signify possible Chronic Kidney Disease.    Anion gap NOT CALCULATED 5 - 15  CBC with Differential     Status: Abnormal   Collection Time: 10/09/14  6:00 AM  Result Value Ref Range   WBC 7.8 4.0 - 10.5 K/uL   RBC 3.27 (L) 4.22 - 5.81 MIL/uL   Hemoglobin 9.7 (L) 13.0 - 17.0 g/dL   HCT 28.8 (L) 39.0 - 52.0 %   MCV  88.1 78.0 - 100.0 fL   MCH 29.7 26.0 - 34.0 pg   MCHC 33.7 30.0 - 36.0 g/dL   RDW 12.7 11.5 - 15.5 %   Platelets 30 (L) 150 - 400 K/uL    Comment: SPECIMEN CHECKED FOR CLOTS PLATELET COUNT CONFIRMED BY SMEAR    Neutrophils Relative % 67 43 - 77 %   Neutro Abs 5.2 1.7 - 7.7 K/uL   Lymphocytes Relative 18 12 - 46 %   Lymphs Abs 1.4 0.7 - 4.0 K/uL   Monocytes Relative 14 (H) 3 - 12 %   Monocytes Absolute 1.1 (H) 0.1 - 1.0 K/uL  Eosinophils Relative 1 0 - 5 %   Eosinophils Absolute 0.1 0.0 - 0.7 K/uL   Basophils Relative 0 0 - 1 %   Basophils Absolute 0.0 0.0 - 0.1 K/uL  Glucose, capillary     Status: Abnormal   Collection Time: 10/09/14  7:19 AM  Result Value Ref Range   Glucose-Capillary 162 (H) 70 - 99 mg/dL   Comment 1 Documented in Chart    Comment 2 Notify RN   Glucose, capillary     Status: Abnormal   Collection Time: 10/09/14 11:19 AM  Result Value Ref Range   Glucose-Capillary 169 (H) 70 - 99 mg/dL   Comment 1 Documented in Chart    Comment 2 Notify RN     Lipid Panel No results for input(s): CHOL, TRIG, HDL, CHOLHDL, VLDL, LDLCALC in the last 72 hours.  Studies/Results:   Medications:  Scheduled Meds: . antiseptic oral rinse  7 mL Mouth Rinse BID  . feeding supplement (ENSURE COMPLETE)  237 mL Oral QHS  . [START ON 10/10/2014] feeding supplement (GLUCERNA SHAKE)  237 mL Oral BID BM  . haloperidol lactate  7.5 mg Intramuscular 4 times per day  . insulin aspart  0-9 Units Subcutaneous TID WC & HS  . potassium chloride  10 mEq Intravenous Q1 Hr x 4  . QUEtiapine  200 mg Oral BID  . sodium chloride  3 mL Intravenous Q12H  . sulfamethoxazole-trimethoprim  240 mg of trimethoprim Intravenous 4 times per day  . valproate sodium  500 mg Intravenous Q12H   Continuous Infusions: . dextrose 5 % and 0.45% NaCl 125 mL/hr at 10/09/14 0600   PRN Meds:.acetaminophen **OR** acetaminophen, chlorproMAZINE (THORAZINE) injection, labetalol     LOS: 14 days   Eris Hannan A.  [name], M.D.  Diplomate, Tax adviser of Psychiatry and Neurology ( Neurology).

## 2014-10-09 NOTE — Progress Notes (Addendum)
   This is not a formal infectious disease consultation but rather informal recommendations given that the patient was found to have MRSA bacteremia.  ID: Mr. Wender is a 59 year old male with history of bipolar d/o and prior CVA (with resultant cognitive deficits) admitted with ARF/hyperkalemia and worsening AMS. He was found to be febrile to 102.22F on 1/30. Due to concern for infection, he was started on vancomycin and piptazo. Infectious work up revealed 2 sets of blood cultures + MRSA.  antibiotics narrowed to vancomycin and his fever curve has trended downward.He was noted to have cellulitis in the left wrist IV site. LUE ultrasound showed thrombus in the superficial venous system of the left arm but also a non-occlusive thrombus in the Left Internal jugular vein. No other source of MRSA infection has been isolated.[name] He has had persistent thrombocytopenia since being started on vancomycin, BL plt initial 100s now down to 31 x 2 days.  24hr event: still has low platelets, remain afebrile. His baseline CK 1152  Assessment & Plan:   -  We recommend to switch from daptomycin to bactrim IV (dose at 15mg /kg/d) as an alternative. Will not use vancomycin due to concern for drug induced thrombocytopenia, unable to use linezolid for the same reason. Due to elevated CK at baseline, will not use daptomycin      [place] [facility] Staphylococcus aureus bacteremia   Staphylococcus aureus bacteremia (SAB) is associated with a high rate of complications and mortality.  Specific aspects of clinical management are critical to optimizing the outcome of patients with SAB.  Therefore, the [facility] Antimicrobial Management Team [facility]) has initiated an intervention aimed at improving the management of SAB at [facility].  To do so, Infectious Diseases physicians are providing an evidence-based consult for the management of all patients with SAB.     Yes No Comments  Perform follow-up  blood cultures (even if the patient is afebrile) to ensure clearance of bacteremia [x]  []  2/2 blood cx NGTD at 48       Perform echocardiography to evaluate for endocarditis (transthoracic ECHO is 40-50% sensitive, TEE is > 90% sensitive) [x]  []  TTE does not suggest endocarditis       Ensure source control []  []  Have all abscesses been drained effectively? Have deep seeded infections (septic joints or osteomyelitis) had appropriate surgical debridement?  Investigate for "metastatic" sites of infection []  []  Does the patient have ANY symptom or physical exam finding that would suggest a deeper infection (back or neck pain that may be suggestive of vertebral osteomyelitis or epidural abscess, muscle pain that could be a symptom of pyomyositis)?  Keep in mind that for deep seeded infections MRI imaging with contrast is preferred rather than other often insensitive tests such as plain x-rays, especially early in a patient's presentation.  Change antibiotic therapy to __bactrim @ 15 mg/kg/day in divided doses [x]  []  Changed off of dapto due to elevated baseline CK level  Estimated duration of IV antibiotic therapy: 4- 6 wk, [x]  []  Using 2/2 as day #1. For presumed septic thrombosis

## 2014-10-09 NOTE — Progress Notes (Signed)
Subjective: Patient in bed.  Talkative this AM.  Inappropriate responses.    NAD.  Objective: Vital signs in last 24 hours: Temp:  [97.7 F (36.5 C)-98.6 F (37 C)] 98.3 F (36.8 C) (02/05 0745) Pulse Rate:  [87-109] 93 (02/05 0700) Resp:  [17-34] 22 (02/05 0700) BP: (126-169)/(49-136) 165/63 mmHg (02/05 0700) SpO2:  [91 %-99 %] 94 % (02/05 0700)  Intake/Output from previous day: 02/04 0800 - 02/05 0759 In: 3430 [P.O.:90; I.V.:2875; IV Piggyback:465] Out: 1250 [Urine:1250] Intake/Output this shift:    General appearance: alert, appears older than stated age and uncooperative Extremities: extremities normal, atraumatic, no cyanosis or edema and no petechial rash.  Right lateral hip ecchymosis, trauma related Foley: Clear yellow urine noted  Lab Results:   Recent Labs  10/08/14 1903 10/09/14 0600  WBC 7.3 7.8  HGB 9.4* 9.7*  HCT 27.7* 28.8*  PLT 27* 30*   BMET  Recent Labs  10/08/14 0745 10/09/14 0600  NA 138 137  K 3.1* 3.0*  CL 107 109  CO2 27 26  GLUCOSE 191* 166*  BUN 34* 28*  CREATININE 1.22 1.17  CALCIUM 7.7* 7.4*    Studies/Results: No results found.  Medications: I have reviewed the patient's current medications.  Assessment/Plan: 1. Thrombocytopenia, ?reative to infectious process versus medication induced versus primary bone marrow disorder?  trending upwards with nadir at 24,000 on 10/08/2014.  No active bleeding this AM or petechia.Foley demonstrates clear urine. Will follow CBCs. If platelets drop to 20,000 or less, and/or active bleeding is noted, then pheresed platelets should be ordered.  HIT panel ordered by ID, with a low score (2-3) on 4T Clinical Scoring System but without clinical indication for scoring due severe thrombocytopenia without exposure to heparin or LMWH during this hospitalization until 10/06/2014 when hematology was consulted and DVT was noted, thus prompting a trial of half-dose LMWH in the setting of thrombocytopenia  (34,000).  Therefore HIT being cause of thrombocytopenia is very unlikely and testing is not indicated based upon scoring system, timing of thrombocytopenia, and timing of LMWH administration.  HIT panel is pending.  Vancomycin appropriately discontinued today, 10/09/2014 per ID recommendations. 2. Left internal jugular non-occlusive thrombus (acute versus subacute). Started on 1/2 dose dose Lovenox BID on 10/06/2014 for anticoagulation, but discontinued on 10/07/2014 due to #5. No anticoagulation recommended at this time. 3. Anemia is stable. Improved. Likely secondary to gross hematuria. Stool cards are ordered. UA with reflex on 10/07/2014 was negative for signs of infection. 4. Bicytopenia. If counts do not improve soon, he would be a candidate for bone marrow aspiration and biopsy, but utility of that test is questionable as he is not a candidate for aggressive intervention for dysplastic syndromes, except possibly 5Q- MDS. Will monitor labs closely. 5. Gross hematuria resolved. UA on 10/08/2014 is negative for signs of infection. Half dose Lovenox is discontinued on 10/07/2014 after being started on 10/06/2014 for #2. 6. Leukopenia, resolved. 7. Hematology will follow from the periphery due to improving platelet count.  If platelet count drops, further recommendations will be made.  If hematology needs to see the patient, please call [facility] [phone]) or page provider (Pager: [phone])  Patient and plan discussed with Dr. Ancil Linsey and she is in agreement with the aforementioned.      LOS: 14 days    KEFALAS,THOMAS 10/09/2014   Agree with above.  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  We will continue to follow and make additional recommendations regarding  anticoagulation once counts recover.  Please call through weekend if needed. Molli Hazard, MD

## 2014-10-09 NOTE — Progress Notes (Addendum)
FOLLOW UP NUTRITION ASSESSMENT  DOCUMENTATION CODES Per approved criteria  -Severe malnutrition in the context of chronic illness   INTERVENTION: -Ordered glucerna BID.   - Would Recommend place NGT (if pt able in light of his reported agitation and combativeness)   Initiate Vital 1.5 @ 20 ml/hr via NGT and increase by 10 ml every 6 hours to initial goal rate of 55  ml/hr.   Tube feeding regimen provides 1980 kcal (100% of energy and 94% protein needs), 89 grams of protein, and 1008 ml of H2O.    Recommend :Monitor magnesium, potassium, and phosphorus daily for at least 3 days, MD to replete as needed, as pt is at risk for refeeding syndrome given his prolonged hypocaloric intake and hx of severe malnutrition.  Current weight pending to assess for percent change. Nursing aware.  NUTRITION DIAGNOSIS: Inadequate oral intake related to toxic metabolic encephalopathy as evidenced by meal intake 0-10% x 10 days.   Goal: Pt to meet >/= 90% of their estimated nutrition needs    Monitor:  Nutrition support measures, labs and weights, skin assessments   ASSESSMENT: Pt hx significant for CVA,vascular dementia,  severe malnutrition , FTT, altered mental status (with psychosis) and dehydration. He presented from [facility] 10 days ago with chief compliant: combative.  He was dehydrated and hyperkcalemic on admission. He is spiking a fever and has gram + bacteremia.   Visited pt, though unable to communicate with d/t sedation and AMS. Pt had full lunch tray at bed of table.  Patient has been receiving sedation to calm down his agitation but this has also made it harder for him to eat.   Would definitely recommend a feeding tube. Spoke with Dr. Legrand Rams. Feeding tube is not in plan of care at this time. Will continue to try supplements.  Pt is high risk for worsening nutritional status and skin breakdown due to his poor po intake and increased nutrient needs.   Height: Ht Readings from Last 1  Encounters:  09/25/14 5\' 9"  (1.753 m)    Weight: Wt Readings from Last 1 Encounters:  09/28/14 130 lb 1.1 oz (59 kg)  10/05/14-   Ideal Body Weight: 160# (72.7 kg)  % Ideal Body Weight: 81 %  Wt Readings from Last 10 Encounters:  09/28/14 130 lb 1.1 oz (59 kg)  07/14/14 152 lb 8.9 oz (69.2 kg)  09/07/13 125 lb 3.5 oz (56.8 kg)  08/18/13 168 lb (76.204 kg)  06/17/13 168 lb 3.4 oz (76.3 kg)  06/02/13 172 lb (78.019 kg)  02/21/10 179 lb (81.194 kg)    Usual Body Weight: 150-155#  % Usual Body Weight: 85%  BMI:  Body mass index is 19.2 kg/(m^2). normal range  Estimated Nutritional Needs: Kcal: 1800-2100 Protein: >83- gr Fluid: 1800 ml daily  Skin:Dry w/ multiple blisters/bruises  Diet Order: Diet Carb Modified    EDUCATION NEEDS: -Education not appropriate at this time   Intake/Output Summary (Last 24 hours) at 10/09/14 1344 Last data filed at 10/09/14 0952  Gross per 24 hour  Intake   3010 ml  Output   1025 ml  Net   1985 ml  Ate 85% of breakfast   Last BM: 10/09/14  Labs:   Recent Labs Lab 10/07/14 0457 10/08/14 0745 10/09/14 0600  NA 135 138 137  K 3.2* 3.1* 3.0*  CL 104 107 109  CO2 26 27 26   BUN 35* 34* 28*  CREATININE 1.28 1.22 1.17  CALCIUM 7.4* 7.7* 7.4*  GLUCOSE 180* 191*  166*    CBG (last 3)   Recent Labs  10/08/14 2101 10/09/14 0719 10/09/14 1119  GLUCAP 99 162* 169*    Scheduled Meds: . antiseptic oral rinse  7 mL Mouth Rinse BID  . feeding supplement (ENSURE COMPLETE)  237 mL Oral QHS  . haloperidol lactate  7.5 mg Intramuscular 4 times per day  . insulin aspart  0-9 Units Subcutaneous TID WC & HS  . QUEtiapine  200 mg Oral BID  . sodium chloride  3 mL Intravenous Q12H  . sulfamethoxazole-trimethoprim  240 mg of trimethoprim Intravenous 4 times per day  . valproate sodium  500 mg Intravenous Q12H    Continuous Infusions: . dextrose 5 % and 0.45% NaCl 125 mL/hr at 10/09/14 0600    Past Medical History  Diagnosis  Date  . Aortic dissection   . Hypertension   . CVA (cerebral infarction)   . Aortic dissection   . Hypertension   . CVA (cerebral infarction)   . Diabetes mellitus   . Cardiomyopathy   . Hyperglycemia   . Bipolar 1 disorder   . Hyperkalemia   . Coronary artery disease   . Hyponatremia   . Altered mental status     with psychosis  . Dehydration   . TIA (transient ischemic attack)   . Lack of coordination   . Cardiomyopathy     Past Surgical History  Procedure Laterality Date  . Ascending aortic aneurysm repair      Burtis Junes RD, LDN Nutrition [phone] 10/09/2014 1:45 PM

## 2014-10-09 NOTE — Progress Notes (Signed)
Subjective: Patient is more awake. He has continuous body movement. His po intake has been poor. I talked to Dr. Baxter Flattery ID specialist about his MRSA bacteremia. She has suggested to Echo and change the antibiotic to Daptomycin.  Objective: Vital signs in last 24 hours: Temp:  [97.7 F (36.5 C)-98.6 F (37 C)] 98.3 F (36.8 C) (02/05 0745) Pulse Rate:  [87-109] 93 (02/05 0700) Resp:  [17-34] 22 (02/05 0700) BP: (126-169)/(49-136) 165/63 mmHg (02/05 0700) SpO2:  [91 %-99 %] 94 % (02/05 0700) Weight change:  Last BM Date: 10/09/14  Intake/Output from previous day: 02/04 0701 - 02/05 0700 In: 3430 [P.O.:90; I.V.:2875; IV Piggyback:465] Out: 1250 [Urine:1250]  PHYSICAL EXAM General appearance: no distress and slowed mentation Resp: diminished breath sounds bilaterally and rhonchi bilaterally Cardio: S1, S2 normal GI: soft, non-tender; bowel sounds normal; no masses,  no organomegaly Extremities: extremities normal, atraumatic, no cyanosis or edema  Lab Results:  Results for orders placed or performed during the hospital encounter of 09/25/14 (from the past 48 hour(s))  Glucose, capillary     Status: Abnormal   Collection Time: 10/07/14 11:50 AM  Result Value Ref Range   Glucose-Capillary 141 (H) 70 - 99 mg/dL   Comment 1 Documented in Chart    Comment 2 Notify RN   Glucose, capillary     Status: Abnormal   Collection Time: 10/07/14  4:55 PM  Result Value Ref Range   Glucose-Capillary 168 (H) 70 - 99 mg/dL  Urinalysis, Routine w reflex microscopic     Status: Abnormal   Collection Time: 10/07/14  5:26 PM  Result Value Ref Range   Color, Urine ORANGE (A) YELLOW    Comment: BIOCHEMICALS MAY BE AFFECTED BY COLOR   APPearance HAZY (A) CLEAR   Specific Gravity, Urine 1.025 1.005 - 1.030   pH 5.5 5.0 - 8.0   Glucose, UA NEGATIVE NEGATIVE mg/dL   Hgb urine dipstick LARGE (A) NEGATIVE   Bilirubin Urine SMALL (A) NEGATIVE   Ketones, ur TRACE (A) NEGATIVE mg/dL   Protein, ur 100  (A) NEGATIVE mg/dL   Urobilinogen, UA 1.0 0.0 - 1.0 mg/dL   Nitrite NEGATIVE NEGATIVE   Leukocytes, UA NEGATIVE NEGATIVE  Urine microscopic-add on     Status: Abnormal   Collection Time: 10/07/14  5:26 PM  Result Value Ref Range   Squamous Epithelial / LPF MANY (A) RARE   WBC, UA 0-2 <3 WBC/hpf   RBC / HPF TOO NUMEROUS TO COUNT <3 RBC/hpf   Bacteria, UA MANY (A) RARE  Vitamin B12     Status: None   Collection Time: 10/07/14  5:34 PM  Result Value Ref Range   Vitamin B-12 630 211 - 911 pg/mL    Comment: Performed at [facility]  Folate     Status: None   Collection Time: 10/07/14  5:34 PM  Result Value Ref Range   Folate 8.8 ng/mL    Comment: (NOTE) Reference Ranges        Deficient:       0.4 - 3.3 ng/mL        Indeterminate:   3.4 - 5.4 ng/mL        Normal:              > 5.4 ng/mL Performed at [facility]   Iron and TIBC     Status: Abnormal   Collection Time: 10/07/14  5:34 PM  Result Value Ref Range   Iron <10 (L) 42 - 165 ug/dL  TIBC Not calculated due to Iron <10. 215 - 435 ug/dL   Saturation Ratios Not calculated due to Iron <10. 20 - 55 %   UIBC 126 125 - 400 ug/dL    Comment: Performed at [facility]  Ferritin     Status: Abnormal   Collection Time: 10/07/14  5:34 PM  Result Value Ref Range   Ferritin 849 (H) 22 - 322 ng/mL    Comment: Performed at [facility]  Reticulocytes     Status: Abnormal   Collection Time: 10/07/14  5:34 PM  Result Value Ref Range   Retic Ct Pct 0.4 0.4 - 3.1 %   RBC. 3.53 (L) 4.22 - 5.81 MIL/uL   Retic Count, Manual 14.1 (L) 19.0 - 186.0 K/uL  CBC     Status: Abnormal   Collection Time: 10/07/14  5:34 PM  Result Value Ref Range   WBC 5.8 4.0 - 10.5 K/uL   RBC 3.53 (L) 4.22 - 5.81 MIL/uL   Hemoglobin 10.4 (L) 13.0 - 17.0 g/dL   HCT 31.2 (L) 39.0 - 52.0 %   MCV 88.4 78.0 - 100.0 fL   MCH 29.5 26.0 - 34.0 pg   MCHC 33.3 30.0 - 36.0 g/dL   RDW 12.8 11.5 - 15.5 %   Platelets 28 (LL) 150 - 400  K/uL    Comment: DELTA CHECK NOTED RESULT REPEATED AND VERIFIED CRITICAL RESULT CALLED TO, READ BACK BY AND VERIFIED WITH: PHILLIPS,S AT 1815 ON 10/07/2014 BY ISLEY,B   Glucose, capillary     Status: Abnormal   Collection Time: 10/07/14  9:29 PM  Result Value Ref Range   Glucose-Capillary 114 (H) 70 - 99 mg/dL  CBC with Differential     Status: Abnormal   Collection Time: 10/08/14  7:40 AM  Result Value Ref Range   WBC 6.2 4.0 - 10.5 K/uL   RBC 3.37 (L) 4.22 - 5.81 MIL/uL   Hemoglobin 10.0 (L) 13.0 - 17.0 g/dL   HCT 29.6 (L) 39.0 - 52.0 %   MCV 87.8 78.0 - 100.0 fL   MCH 29.7 26.0 - 34.0 pg   MCHC 33.8 30.0 - 36.0 g/dL   RDW 12.7 11.5 - 15.5 %   Platelets 24 (LL) 150 - 400 K/uL    Comment: RESULT REPEATED AND VERIFIED CRITICAL RESULT CALLED TO, READ BACK BY AND VERIFIED WITH:  NEDINE ROWE RN ON 2 4 2016 AT 0806 BY MURIKA WOODS    Neutrophils Relative % 77 43 - 77 %   Neutro Abs 4.8 1.7 - 7.7 K/uL   Lymphocytes Relative 13 12 - 46 %   Lymphs Abs 0.8 0.7 - 4.0 K/uL   Monocytes Relative 9 3 - 12 %   Monocytes Absolute 0.6 0.1 - 1.0 K/uL   Eosinophils Relative 1 0 - 5 %   Eosinophils Absolute 0.0 0.0 - 0.7 K/uL   Basophils Relative 0 0 - 1 %   Basophils Absolute 0.0 0.0 - 0.1 K/uL  Vancomycin, trough     Status: None   Collection Time: 10/08/14  7:43 AM  Result Value Ref Range   Vancomycin Tr 10.3 10.0 - 20.0 ug/mL  Basic metabolic panel     Status: Abnormal   Collection Time: 10/08/14  7:45 AM  Result Value Ref Range   Sodium 138 135 - 145 mmol/L   Potassium 3.1 (L) 3.5 - 5.1 mmol/L   Chloride 107 96 - 112 mmol/L   CO2 27 19 - 32 mmol/L  Glucose, Bld 191 (H) 70 - 99 mg/dL   BUN 34 (H) 6 - 23 mg/dL   Creatinine, Ser 1.22 0.50 - 1.35 mg/dL   Calcium 7.7 (L) 8.4 - 10.5 mg/dL   GFR calc non Af Amer 64 (L) >90 mL/min   GFR calc Af Amer 74 (L) >90 mL/min    Comment: (NOTE) The eGFR has been calculated using the CKD EPI equation. This calculation has not been validated in  all clinical situations. eGFR's persistently <90 mL/min signify possible Chronic Kidney Disease.    Anion gap 4 (L) 5 - 15  Glucose, capillary     Status: Abnormal   Collection Time: 10/08/14  7:56 AM  Result Value Ref Range   Glucose-Capillary 186 (H) 70 - 99 mg/dL   Comment 1 Documented in Chart    Comment 2 Notify RN   Glucose, capillary     Status: Abnormal   Collection Time: 10/08/14 11:40 AM  Result Value Ref Range   Glucose-Capillary 152 (H) 70 - 99 mg/dL  Lactate dehydrogenase     Status: Abnormal   Collection Time: 10/08/14  3:01 PM  Result Value Ref Range   LDH 332 (H) 94 - 250 U/L  CK     Status: Abnormal   Collection Time: 10/08/14  3:04 PM  Result Value Ref Range   Total CK 1152 (H) 7 - 232 U/L  Glucose, capillary     Status: Abnormal   Collection Time: 10/08/14  4:56 PM  Result Value Ref Range   Glucose-Capillary 167 (H) 70 - 99 mg/dL  CBC     Status: Abnormal   Collection Time: 10/08/14  7:03 PM  Result Value Ref Range   WBC 7.3 4.0 - 10.5 K/uL   RBC 3.15 (L) 4.22 - 5.81 MIL/uL   Hemoglobin 9.4 (L) 13.0 - 17.0 g/dL   HCT 27.7 (L) 39.0 - 52.0 %   MCV 87.9 78.0 - 100.0 fL   MCH 29.8 26.0 - 34.0 pg   MCHC 33.9 30.0 - 36.0 g/dL   RDW 12.7 11.5 - 15.5 %   Platelets 27 (LL) 150 - 400 K/uL    Comment: PLATELETS APPEAR DECREASED RESULT REPEATED AND VERIFIED DELTA CHECK NOTED CRITICAL RESULT CALLED TO, READ BACK BY AND VERIFIED WITH: BREWER,D AT 1930 ON 10/08/2014 BY ISLEY,B   Glucose, capillary     Status: None   Collection Time: 10/08/14  9:01 PM  Result Value Ref Range   Glucose-Capillary 99 70 - 99 mg/dL  Sedimentation rate     Status: None   Collection Time: 10/09/14  6:00 AM  Result Value Ref Range   Sed Rate 15 0 - 16 mm/hr  Basic metabolic panel     Status: Abnormal   Collection Time: 10/09/14  6:00 AM  Result Value Ref Range   Sodium 137 135 - 145 mmol/L   Potassium 3.0 (L) 3.5 - 5.1 mmol/L   Chloride 109 96 - 112 mmol/L   CO2 26 19 - 32 mmol/L    Glucose, Bld 166 (H) 70 - 99 mg/dL   BUN 28 (H) 6 - 23 mg/dL   Creatinine, Ser 1.17 0.50 - 1.35 mg/dL   Calcium 7.4 (L) 8.4 - 10.5 mg/dL   GFR calc non Af Amer 68 (L) >90 mL/min   GFR calc Af Amer 78 (L) >90 mL/min    Comment: (NOTE) The eGFR has been calculated using the CKD EPI equation. This calculation has not been validated in all clinical situations.  eGFR's persistently <90 mL/min signify possible Chronic Kidney Disease.    Anion gap NOT CALCULATED 5 - 15  CBC with Differential     Status: Abnormal   Collection Time: 10/09/14  6:00 AM  Result Value Ref Range   WBC 7.8 4.0 - 10.5 K/uL   RBC 3.27 (L) 4.22 - 5.81 MIL/uL   Hemoglobin 9.7 (L) 13.0 - 17.0 g/dL   HCT 28.8 (L) 39.0 - 52.0 %   MCV 88.1 78.0 - 100.0 fL   MCH 29.7 26.0 - 34.0 pg   MCHC 33.7 30.0 - 36.0 g/dL   RDW 12.7 11.5 - 15.5 %   Platelets 30 (L) 150 - 400 K/uL    Comment: SPECIMEN CHECKED FOR CLOTS PLATELET COUNT CONFIRMED BY SMEAR    Neutrophils Relative % 67 43 - 77 %   Neutro Abs 5.2 1.7 - 7.7 K/uL   Lymphocytes Relative 18 12 - 46 %   Lymphs Abs 1.4 0.7 - 4.0 K/uL   Monocytes Relative 14 (H) 3 - 12 %   Monocytes Absolute 1.1 (H) 0.1 - 1.0 K/uL   Eosinophils Relative 1 0 - 5 %   Eosinophils Absolute 0.1 0.0 - 0.7 K/uL   Basophils Relative 0 0 - 1 %   Basophils Absolute 0.0 0.0 - 0.1 K/uL  Glucose, capillary     Status: Abnormal   Collection Time: 10/09/14  7:19 AM  Result Value Ref Range   Glucose-Capillary 162 (H) 70 - 99 mg/dL   Comment 1 Documented in Chart    Comment 2 Notify RN     ABGS No results for input(s): PHART, PO2ART, TCO2, HCO3 in the last 72 hours.  Invalid input(s): PCO2 CULTURES Recent Results (from the past 240 hour(s))  Culture, blood (routine x 2)     Status: None   Collection Time: 10/03/14  9:58 AM  Result Value Ref Range Status   Specimen Description BLOOD RIGHT ANTECUBITAL  Final   Special Requests BOTTLES DRAWN AEROBIC AND ANAEROBIC 6CC  Final   Culture   Final     STAPHYLOCOCCUS AUREUS Note: Gram Stain Report Called to,Read Back By and Verified With: LEE B _0  ON 10/04/14 BY WOODIE J SUSCEPTIBILITIES PERFORMED ON PREVIOUS CULTURE WITHIN THE LAST 5 DAYS. Performed at [facility]    Report Status 10/06/2014 FINAL  Final  Culture, blood (routine x 2)     Status: None   Collection Time: 10/03/14  9:59 AM  Result Value Ref Range Status   Specimen Description BLOOD RIGHT ARM  Final   Special Requests BOTTLES DRAWN AEROBIC AND ANAEROBIC 8CC  Final   Culture   Final    METHICILLIN RESISTANT STAPHYLOCOCCUS AUREUS Note: Gram Stain Report Called to,Read Back By and Verified With: LEE B @ 0330 ON 10/04/14 BY WOODIEB RIFAMPIN AND GENTAMICIN SHOULD NOT BE USED AS SINGLE DRUGS FOR TREATMENT OF STAPH INFECTIONS. This organism DOES NOT demonstrate inducible Clindamycin  resistance in vitro. CRITICAL RESULT CALLED TO, READ BACK BY AND VERIFIED WITH: Waynard Reeds RN AT [facility] AT [phone] BY CASTC Performed at [facility]    Report Status 10/06/2014 FINAL  Final   Organism ID, Bacteria METHICILLIN RESISTANT STAPHYLOCOCCUS AUREUS  Final      Susceptibility   Methicillin resistant staphylococcus aureus - MIC*    CLINDAMYCIN <=0.25 SENSITIVE Sensitive     ERYTHROMYCIN >=8 RESISTANT Resistant     GENTAMICIN <=0.5 SENSITIVE Sensitive     LEVOFLOXACIN 4 INTERMEDIATE Intermediate  OXACILLIN >=4 RESISTANT Resistant     PENICILLIN >=0.5 RESISTANT Resistant     RIFAMPIN <=0.5 SENSITIVE Sensitive     TRIMETH/SULFA <=10 SENSITIVE Sensitive     VANCOMYCIN <=0.5 SENSITIVE Sensitive     TETRACYCLINE <=1 SENSITIVE Sensitive     * METHICILLIN RESISTANT STAPHYLOCOCCUS AUREUS  Culture, Urine     Status: None   Collection Time: 10/03/14 10:00 AM  Result Value Ref Range Status   Specimen Description URINE, CATHETERIZED  Final   Special Requests NONE  Final   Colony Count NO GROWTH Performed at [facility]   Final   Culture NO  GROWTH Performed at [facility]   Final   Report Status 10/04/2014 FINAL  Final  Culture, blood (routine x 2)     Status: None (Preliminary result)   Collection Time: 10/06/14  6:15 AM  Result Value Ref Range Status   Specimen Description BLOOD RIGHT HAND  Final   Special Requests BOTTLES DRAWN AEROBIC AND ANAEROBIC 8CC  Final   Culture NO GROWTH 2 DAYS  Final   Report Status PENDING  Incomplete  Culture, blood (routine x 2)     Status: None (Preliminary result)   Collection Time: 10/06/14  6:15 AM  Result Value Ref Range Status   Specimen Description BLOOD LEFT HAND  Final   Special Requests BOTTLES DRAWN AEROBIC ONLY 8CC  Final   Culture NO GROWTH 2 DAYS  Final   Report Status PENDING  Incomplete   Studies/Results: No results found.  Medications: I have reviewed the patient's current medications.  Assesment:   Principal Problem:   Hyperkalemia Active Problems:   Bipolar I disorder, most recent episode mixed   H/O: stroke   Dehydration   CVA, old, cognitive deficits   CKD (chronic kidney disease) stage 3, GFR 30-59 ml/min   Thrombocytopenia   Acute encephalopathy   Acute renal failure   Hypoglycemia MRSA bacteremia Left jagular thrombus thrombocytobenia  Plan:  Medications reviewed Will increase IV fluid to 125 ml/hr. IV antibiotics as per ID recommendation.  LOS: 14 days   Andrez Lieurance 10/09/2014, 8:15 AM

## 2014-10-10 LAB — CBC WITH DIFFERENTIAL/PLATELET
BASOS PCT: 1 % (ref 0–1)
Basophils Absolute: 0 10*3/uL (ref 0.0–0.1)
EOS ABS: 0.1 10*3/uL (ref 0.0–0.7)
Eosinophils Relative: 1 % (ref 0–5)
HEMATOCRIT: 28 % — AB (ref 39.0–52.0)
HEMOGLOBIN: 9.5 g/dL — AB (ref 13.0–17.0)
Lymphocytes Relative: 18 % (ref 12–46)
Lymphs Abs: 1 10*3/uL (ref 0.7–4.0)
MCH: 29.9 pg (ref 26.0–34.0)
MCHC: 33.9 g/dL (ref 30.0–36.0)
MCV: 88.1 fL (ref 78.0–100.0)
MONOS PCT: 13 % — AB (ref 3–12)
Monocytes Absolute: 0.7 10*3/uL (ref 0.1–1.0)
Neutro Abs: 3.7 10*3/uL (ref 1.7–7.7)
Neutrophils Relative %: 67 % (ref 43–77)
Platelets: 34 10*3/uL — ABNORMAL LOW (ref 150–400)
RBC: 3.18 MIL/uL — ABNORMAL LOW (ref 4.22–5.81)
RDW: 12.9 % (ref 11.5–15.5)
WBC: 5.5 10*3/uL (ref 4.0–10.5)

## 2014-10-10 LAB — GLUCOSE, CAPILLARY
Glucose-Capillary: 119 mg/dL — ABNORMAL HIGH (ref 70–99)
Glucose-Capillary: 131 mg/dL — ABNORMAL HIGH (ref 70–99)
Glucose-Capillary: 136 mg/dL — ABNORMAL HIGH (ref 70–99)
Glucose-Capillary: 168 mg/dL — ABNORMAL HIGH (ref 70–99)

## 2014-10-10 NOTE — Progress Notes (Signed)
   This is not a formal infectious disease consultation but rather informal recommendations given that the patient was found to have MRSA bacteremia.  ID: Derrick Butler is a 59 year old male with history of bipolar d/o and prior CVA (with resultant cognitive deficits) admitted with ARF/hyperkalemia and worsening AMS. He was found to be febrile to 102.13F on 1/30. Due to concern for infection, he was started on vancomycin and piptazo. Infectious work up revealed 2 sets of blood cultures + MRSA.  antibiotics narrowed to vancomycin and his fever curve has trended downward.He was noted to have cellulitis in the left wrist IV site. LUE ultrasound showed thrombus in the superficial venous system of the left arm but also a non-occlusive thrombus in the Left Internal jugular vein. No other source of MRSA infection has been isolated.[name] He has had persistent thrombocytopenia since being started on vancomycin, BL plt initial 100s now down to 34.  24hr event: he remains afebrile. Notes suggests improved mentation.  Imaging: 1. Nonocclusive thrombus in the left jugular vein. This may be acute, or subacute. Has the patient had a recent left IJ approach central line? 2. Positive for superficial venous thrombosis involving the cephalic vein beginning at the elbow and extending up the arm and nearly to the shoulder and within a short segment of the basilic vein just below the elbow. No definite propagation into the deep brachial or axillary veins. 3. Extensive subcutaneous edema in the lower arm and forearm.  Assessment & Plan:   -  Continue on bactrim IV (dose at 15mg /kg/d) for now. Will not use vancomycin due to concern for drug induced thrombocytopenia, unable to use linezolid for the same reason. Due to elevated CK at baseline, will not use daptomycin.  Alternative option to bactrim, would be using ceftaroline. I would ask social worker if he is approved to getting this medication with going to SNF.  -  recommend to get Neck CT to look any other abscess associated with nonocclusive thrombus of the left jugular vein. 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  Specific aspects of clinical management are critical to optimizing the outcome of patients with SAB.  Therefore, the [facility] Antimicrobial Management Team [facility]) has initiated an intervention aimed at improving the management of SAB at [facility].  To do so, Infectious Diseases physicians are providing an evidence-based consult for the management of all patients with SAB.     Yes No Comments  Perform follow-up blood cultures (even if the patient is afebrile) to ensure clearance of bacteremia [x]  []  2/2 blood cx NGTD        Perform echocardiography to evaluate for endocarditis (transthoracic ECHO is 40-50% sensitive, TEE is > 90% sensitive) [x]  []  TTE does not suggest endocarditis       Ensure source control [x]  []  Repeat blood cx on 2/2 ngtd  Investigate for "metastatic" sites of infection [x]  []   Left IJ thrombus   Change antibiotic therapy to __bactrim @ 15 mg/kg/day in divided doses [x]  []  Changed off of dapto due to elevated baseline CK level  Estimated duration of IV antibiotic therapy: 4- 6 wk [x]  []  Using 2/2 as day #1. For presumed septic thrombosis

## 2014-10-10 NOTE — Progress Notes (Signed)
Subjective: Patient is more alert and awake. He is trying to respond to verbal communications. He is out of ICU. His po intake is improving..  Objective: Vital signs in last 24 hours: Temp:  [98.1 F (36.7 C)-98.4 F (36.9 C)] 98.2 F (36.8 C) (02/06 0525) Pulse Rate:  [74-96] 89 (02/06 0525) Resp:  [18-28] 22 (02/06 0525) BP: (101-162)/(58-133) 141/93 mmHg (02/06 0525) SpO2:  [94 %-100 %] 100 % (02/06 0525) Weight change:  Last BM Date: 10/09/14  Intake/Output from previous day: 02/05 0701 - 02/06 0700 In: 2265 [P.O.:720; I.V.:850; IV Piggyback:695] Out: 1884 [Urine:1550]  PHYSICAL EXAM General appearance: no distress and slowed mentation Resp: diminished breath sounds bilaterally and rhonchi bilaterally Cardio: S1, S2 normal GI: soft, non-tender; bowel sounds normal; no masses,  no organomegaly Extremities: extremities normal, atraumatic, no cyanosis or edema  Lab Results:  Results for orders placed or performed during the hospital encounter of 09/25/14 (from the past 48 hour(s))  EBV ab to viral capsid ag pnl, IgG+IgM     Status: None   Collection Time: 10/08/14 11:03 AM  Result Value Ref Range   EBV VCA IgG <10.0 <18.0 U/mL    Comment: (NOTE) Reference Range:       <18.0 U/mL = Negative                   18.0-21.9 U/mL = Equivocal                      >=22.0 U/mL = Positive    EBV VCA IgM <10.0 <36.0 U/mL    Comment: (NOTE) Reference Range:       <36.0 U/mL = Negative                   36.0-43.9 U/mL = Equivocal                      >=44.0 U/mL = Positive       Clinical Stage            VCA IgG   VCA IgM      EA    EBV NA       Susceptibility               -         -         -       -       Very Early Infection        +/-       +/-        -       -       Established Infection        +         +        +/-      -       Recent Infection             +         +        +/-     +/-       Past Infection               +         -        +/-      +                                                        +/-  means positive or negative (not weak) High persisting antibody levels may be present in Burkitt's lymphoma and nasopharyngeal carcinoma. Performed at [facility]   Helicobacter pylori abs-IgG+IgA, bld     Status: None   Collection Time: 10/08/14 11:03 AM  Result Value Ref Range   H Pylori IgA <9.0 0.0 - 8.9 units    Comment: (NOTE)                                Negative          <9.0                                Equivocal   9.0 - 11.0                                Positive         >11.0    H Pylori IgG <0.9 0.0 - 0.8 U/mL    Comment: (NOTE)                             Negative            <0.9                             Indeterminate  0.9 - 1.0                             Positive            >1.0 Performed At: [facility] [place], [place] [id number] Lindon Romp MD [id number]   Glucose, capillary     Status: Abnormal   Collection Time: 10/08/14 11:40 AM  Result Value Ref Range   Glucose-Capillary 152 (H) 70 - 99 mg/dL  Lactate dehydrogenase     Status: Abnormal   Collection Time: 10/08/14  3:01 PM  Result Value Ref Range   LDH 332 (H) 94 - 250 U/L  CK     Status: Abnormal   Collection Time: 10/08/14  3:04 PM  Result Value Ref Range   Total CK 1152 (H) 7 - 232 U/L  Glucose, capillary     Status: Abnormal   Collection Time: 10/08/14  4:56 PM  Result Value Ref Range   Glucose-Capillary 167 (H) 70 - 99 mg/dL  CBC     Status: Abnormal   Collection Time: 10/08/14  7:03 PM  Result Value Ref Range   WBC 7.3 4.0 - 10.5 K/uL   RBC 3.15 (L) 4.22 - 5.81 MIL/uL   Hemoglobin 9.4 (L) 13.0 - 17.0 g/dL   HCT 27.7 (L) 39.0 - 52.0 %   MCV 87.9 78.0 - 100.0 fL   MCH 29.8 26.0 - 34.0 pg   MCHC 33.9 30.0 - 36.0 g/dL   RDW 12.7 11.5 - 15.5 %   Platelets 27 (LL) 150 - 400 K/uL    Comment: PLATELETS APPEAR DECREASED RESULT REPEATED AND VERIFIED DELTA CHECK NOTED CRITICAL RESULT CALLED  TO, READ BACK BY AND VERIFIED WITH: BREWER,D AT 1930 ON 10/08/2014 BY ISLEY,B   Glucose, capillary     Status: None   Collection Time: 10/08/14  9:01  PM  Result Value Ref Range   Glucose-Capillary 99 70 - 99 mg/dL  Sedimentation rate     Status: None   Collection Time: 10/09/14  6:00 AM  Result Value Ref Range   Sed Rate 15 0 - 16 mm/hr  C-reactive protein     Status: Abnormal   Collection Time: 10/09/14  6:00 AM  Result Value Ref Range   CRP 9.0 (H) <0.60 mg/dL    Comment: Performed at [facility]  Hepatitis panel, acute     Status: None   Collection Time: 10/09/14  6:00 AM  Result Value Ref Range   Hepatitis B Surface Ag NEGATIVE NEGATIVE   HCV Ab NEGATIVE NEGATIVE   Hep A IgM NON REACTIVE NON REACTIVE    Comment: (NOTE) Effective July 20, 2014, Hepatitis Acute Panel (test code [id number]) will be revised to automatically reflex to the Hepatitis C Viral RNA, Quantitative, Real-Time PCR assay if the Hepatitis C antibody screening result is Reactive. This action is being taken to ensure that the CDC/USPSTF recommended HCV diagnostic algorithm with the appropriate test reflex needed for accurate interpretation is followed.    Hep B C IgM NON REACTIVE NON REACTIVE    Comment: (NOTE) High levels of Hepatitis B Core IgM antibody are detectable during the acute stage of Hepatitis B. This antibody is used to differentiate current from past HBV infection. Performed at [place] metabolic panel     Status: Abnormal   Collection Time: 10/09/14  6:00 AM  Result Value Ref Range   Sodium 137 135 - 145 mmol/L   Potassium 3.0 (L) 3.5 - 5.1 mmol/L   Chloride 109 96 - 112 mmol/L   CO2 26 19 - 32 mmol/L   Glucose, Bld 166 (H) 70 - 99 mg/dL   BUN 28 (H) 6 - 23 mg/dL   Creatinine, Ser 1.17 0.50 - 1.35 mg/dL   Calcium 7.4 (L) 8.4 - 10.5 mg/dL   GFR calc non Af Amer 68 (L) >90 mL/min   GFR calc Af Amer 78 (L) >90 mL/min    Comment: (NOTE) The eGFR has been  calculated using the CKD EPI equation. This calculation has not been validated in all clinical situations. eGFR's persistently <90 mL/min signify possible Chronic Kidney Disease.    Anion gap NOT CALCULATED 5 - 15  CBC with Differential     Status: Abnormal   Collection Time: 10/09/14  6:00 AM  Result Value Ref Range   WBC 7.8 4.0 - 10.5 K/uL   RBC 3.27 (L) 4.22 - 5.81 MIL/uL   Hemoglobin 9.7 (L) 13.0 - 17.0 g/dL   HCT 28.8 (L) 39.0 - 52.0 %   MCV 88.1 78.0 - 100.0 fL   MCH 29.7 26.0 - 34.0 pg   MCHC 33.7 30.0 - 36.0 g/dL   RDW 12.7 11.5 - 15.5 %   Platelets 30 (L) 150 - 400 K/uL    Comment: SPECIMEN CHECKED FOR CLOTS PLATELET COUNT CONFIRMED BY SMEAR    Neutrophils Relative % 67 43 - 77 %   Neutro Abs 5.2 1.7 - 7.7 K/uL   Lymphocytes Relative 18 12 - 46 %   Lymphs Abs 1.4 0.7 - 4.0 K/uL   Monocytes Relative 14 (H) 3 - 12 %   Monocytes Absolute 1.1 (H) 0.1 - 1.0 K/uL   Eosinophils Relative 1 0 - 5 %   Eosinophils Absolute 0.1 0.0 - 0.7 K/uL   Basophils Relative 0 0 - 1 %  Basophils Absolute 0.0 0.0 - 0.1 K/uL  Glucose, capillary     Status: Abnormal   Collection Time: 10/09/14  7:19 AM  Result Value Ref Range   Glucose-Capillary 162 (H) 70 - 99 mg/dL   Comment 1 Documented in Chart    Comment 2 Notify RN   Glucose, capillary     Status: Abnormal   Collection Time: 10/09/14 11:19 AM  Result Value Ref Range   Glucose-Capillary 169 (H) 70 - 99 mg/dL   Comment 1 Documented in Chart    Comment 2 Notify RN   Glucose, capillary     Status: Abnormal   Collection Time: 10/09/14  5:10 PM  Result Value Ref Range   Glucose-Capillary 147 (H) 70 - 99 mg/dL   Comment 1 Notify RN   Glucose, capillary     Status: Abnormal   Collection Time: 10/09/14  9:38 PM  Result Value Ref Range   Glucose-Capillary 123 (H) 70 - 99 mg/dL  CBC with Differential     Status: Abnormal   Collection Time: 10/10/14  7:47 AM  Result Value Ref Range   WBC 5.5 4.0 - 10.5 K/uL   RBC 3.18 (L) 4.22 -  5.81 MIL/uL   Hemoglobin 9.5 (L) 13.0 - 17.0 g/dL   HCT 28.0 (L) 39.0 - 52.0 %   MCV 88.1 78.0 - 100.0 fL   MCH 29.9 26.0 - 34.0 pg   MCHC 33.9 30.0 - 36.0 g/dL   RDW 12.9 11.5 - 15.5 %   Platelets 34 (L) 150 - 400 K/uL    Comment: SPECIMEN CHECKED FOR CLOTS CONSISTENT WITH PREVIOUS RESULT    Neutrophils Relative % 67 43 - 77 %   Neutro Abs 3.7 1.7 - 7.7 K/uL   Lymphocytes Relative 18 12 - 46 %   Lymphs Abs 1.0 0.7 - 4.0 K/uL   Monocytes Relative 13 (H) 3 - 12 %   Monocytes Absolute 0.7 0.1 - 1.0 K/uL   Eosinophils Relative 1 0 - 5 %   Eosinophils Absolute 0.1 0.0 - 0.7 K/uL   Basophils Relative 1 0 - 1 %   Basophils Absolute 0.0 0.0 - 0.1 K/uL  Glucose, capillary     Status: Abnormal   Collection Time: 10/10/14  7:52 AM  Result Value Ref Range   Glucose-Capillary 119 (H) 70 - 99 mg/dL    ABGS No results for input(s): PHART, PO2ART, TCO2, HCO3 in the last 72 hours.  Invalid input(s): PCO2 CULTURES Recent Results (from the past 240 hour(s))  Culture, blood (routine x 2)     Status: None   Collection Time: 10/03/14  9:58 AM  Result Value Ref Range Status   Specimen Description BLOOD RIGHT ANTECUBITAL  Final   Special Requests BOTTLES DRAWN AEROBIC AND ANAEROBIC 6CC  Final   Culture   Final    STAPHYLOCOCCUS AUREUS Note: Gram Stain Report Called to,Read Back By and Verified With: LEE B @0330  ON 10/04/14 BY WOODIE J SUSCEPTIBILITIES PERFORMED ON PREVIOUS CULTURE WITHIN THE LAST 5 DAYS. Performed at [facility]    Report Status 10/06/2014 FINAL  Final  Culture, blood (routine x 2)     Status: None   Collection Time: 10/03/14  9:59 AM  Result Value Ref Range Status   Specimen Description BLOOD RIGHT ARM  Final   Special Requests BOTTLES DRAWN AEROBIC AND ANAEROBIC 8CC  Final   Culture   Final    METHICILLIN RESISTANT STAPHYLOCOCCUS AUREUS Note: Gram Stain Report Called to,Read Back  By and Verified With: LEE B @ 0330 ON 10/04/14 BY WOODIEB RIFAMPIN AND GENTAMICIN  SHOULD NOT BE USED AS SINGLE DRUGS FOR TREATMENT OF STAPH INFECTIONS. This organism DOES NOT demonstrate inducible Clindamycin  resistance in vitro. CRITICAL RESULT CALLED TO, READ BACK BY AND VERIFIED WITH: Waynard Reeds RN AT [facility] AT [phone] BY CASTC Performed at [facility]    Report Status 10/06/2014 FINAL  Final   Organism ID, Bacteria METHICILLIN RESISTANT STAPHYLOCOCCUS AUREUS  Final      Susceptibility   Methicillin resistant staphylococcus aureus - MIC*    CLINDAMYCIN <=0.25 SENSITIVE Sensitive     ERYTHROMYCIN >=8 RESISTANT Resistant     GENTAMICIN <=0.5 SENSITIVE Sensitive     LEVOFLOXACIN 4 INTERMEDIATE Intermediate     OXACILLIN >=4 RESISTANT Resistant     PENICILLIN >=0.5 RESISTANT Resistant     RIFAMPIN <=0.5 SENSITIVE Sensitive     TRIMETH/SULFA <=10 SENSITIVE Sensitive     VANCOMYCIN <=0.5 SENSITIVE Sensitive     TETRACYCLINE <=1 SENSITIVE Sensitive     * METHICILLIN RESISTANT STAPHYLOCOCCUS AUREUS  Culture, Urine     Status: None   Collection Time: 10/03/14 10:00 AM  Result Value Ref Range Status   Specimen Description URINE, CATHETERIZED  Final   Special Requests NONE  Final   Colony Count NO GROWTH Performed at [facility]   Final   Culture NO GROWTH Performed at [facility]   Final   Report Status 10/04/2014 FINAL  Final  Culture, blood (routine x 2)     Status: None (Preliminary result)   Collection Time: 10/06/14  6:15 AM  Result Value Ref Range Status   Specimen Description BLOOD RIGHT HAND  Final   Special Requests BOTTLES DRAWN AEROBIC AND ANAEROBIC 8CC  Final   Culture NO GROWTH 4 DAYS  Final   Report Status PENDING  Incomplete  Culture, blood (routine x 2)     Status: None (Preliminary result)   Collection Time: 10/06/14  6:15 AM  Result Value Ref Range Status   Specimen Description BLOOD LEFT HAND  Final   Special Requests BOTTLES DRAWN AEROBIC ONLY 8CC  Final   Culture NO GROWTH 4 DAYS  Final   Report Status  PENDING  Incomplete   Studies/Results: No results found.  Medications: I have reviewed the patient's current medications.  Assesment:   Principal Problem:   Hyperkalemia Active Problems:   Bipolar I disorder, most recent episode mixed   H/O: stroke   Dehydration   CVA, old, cognitive deficits   CKD (chronic kidney disease) stage 3, GFR 30-59 ml/min   Thrombocytopenia   Acute encephalopathy   Acute renal failure   Hypoglycemia MRSA bacteremia Left jagular thrombus thrombocytobenia  Plan:  Medications reviewed Will decrease IV fluid. IV antibiotics as per ID recommendation. Supportive care  LOS: 15 days   Derrick Butler 10/10/2014, 10:08 AM

## 2014-10-11 LAB — CULTURE, BLOOD (ROUTINE X 2)
Culture: NO GROWTH
Culture: NO GROWTH

## 2014-10-11 LAB — BASIC METABOLIC PANEL
Anion gap: 6 (ref 5–15)
BUN: 14 mg/dL (ref 6–23)
CHLORIDE: 103 mmol/L (ref 96–112)
CO2: 26 mmol/L (ref 19–32)
Calcium: 7.3 mg/dL — ABNORMAL LOW (ref 8.4–10.5)
Creatinine, Ser: 1.22 mg/dL (ref 0.50–1.35)
GFR calc non Af Amer: 64 mL/min — ABNORMAL LOW (ref >90)
GFR, EST AFRICAN AMERICAN: 74 mL/min — AB (ref >90)
Glucose, Bld: 132 mg/dL — ABNORMAL HIGH (ref 70–99)
Potassium: 3.2 mmol/L — ABNORMAL LOW (ref 3.5–5.1)
SODIUM: 135 mmol/L (ref 135–145)

## 2014-10-11 LAB — CBC
HCT: 28.8 % — ABNORMAL LOW (ref 39.0–52.0)
HEMOGLOBIN: 9.6 g/dL — AB (ref 13.0–17.0)
MCH: 29.5 pg (ref 26.0–34.0)
MCHC: 33.3 g/dL (ref 30.0–36.0)
MCV: 88.6 fL (ref 78.0–100.0)
PLATELETS: 50 10*3/uL — AB (ref 150–400)
RBC: 3.25 MIL/uL — AB (ref 4.22–5.81)
RDW: 13.2 % (ref 11.5–15.5)
WBC: 6.6 10*3/uL (ref 4.0–10.5)

## 2014-10-11 LAB — GLUCOSE, CAPILLARY
GLUCOSE-CAPILLARY: 140 mg/dL — AB (ref 70–99)
GLUCOSE-CAPILLARY: 146 mg/dL — AB (ref 70–99)
Glucose-Capillary: 122 mg/dL — ABNORMAL HIGH (ref 70–99)
Glucose-Capillary: 95 mg/dL (ref 70–99)

## 2014-10-11 NOTE — Progress Notes (Signed)
Subjective: Patient is alert and responding to verbal communication. No fever or chills. Objective: Vital signs in last 24 hours: Temp:  [97.9 F (36.6 C)-98.5 F (36.9 C)] 97.9 F (36.6 C) (02/07 0630) Pulse Rate:  [79-89] 79 (02/07 0630) Resp:  [22-24] 22 (02/07 0630) BP: (120-139)/(32-77) 139/59 mmHg (02/07 0630) SpO2:  [90 %-100 %] 100 % (02/07 0630) Weight change:  Last BM Date: 10/09/14  Intake/Output from previous day: 02/06 0701 - 02/07 0700 In: 420 [P.O.:420] Out: 2100 [Urine:2100]  PHYSICAL EXAM General appearance: no distress and slowed mentation Resp: diminished breath sounds bilaterally and rhonchi bilaterally Cardio: S1, S2 normal GI: soft, non-tender; bowel sounds normal; no masses,  no organomegaly Extremities: extremities normal, atraumatic, no cyanosis or edema  Lab Results:  Results for orders placed or performed during the hospital encounter of 09/25/14 (from the past 48 hour(s))  Glucose, capillary     Status: Abnormal   Collection Time: 10/09/14  5:10 PM  Result Value Ref Range   Glucose-Capillary 147 (H) 70 - 99 mg/dL   Comment 1 Notify RN   Glucose, capillary     Status: Abnormal   Collection Time: 10/09/14  9:38 PM  Result Value Ref Range   Glucose-Capillary 123 (H) 70 - 99 mg/dL  CBC with Differential     Status: Abnormal   Collection Time: 10/10/14  7:47 AM  Result Value Ref Range   WBC 5.5 4.0 - 10.5 K/uL   RBC 3.18 (L) 4.22 - 5.81 MIL/uL   Hemoglobin 9.5 (L) 13.0 - 17.0 g/dL   HCT 28.0 (L) 39.0 - 52.0 %   MCV 88.1 78.0 - 100.0 fL   MCH 29.9 26.0 - 34.0 pg   MCHC 33.9 30.0 - 36.0 g/dL   RDW 12.9 11.5 - 15.5 %   Platelets 34 (L) 150 - 400 K/uL    Comment: SPECIMEN CHECKED FOR CLOTS CONSISTENT WITH PREVIOUS RESULT    Neutrophils Relative % 67 43 - 77 %   Neutro Abs 3.7 1.7 - 7.7 K/uL   Lymphocytes Relative 18 12 - 46 %   Lymphs Abs 1.0 0.7 - 4.0 K/uL   Monocytes Relative 13 (H) 3 - 12 %   Monocytes Absolute 0.7 0.1 - 1.0 K/uL    Eosinophils Relative 1 0 - 5 %   Eosinophils Absolute 0.1 0.0 - 0.7 K/uL   Basophils Relative 1 0 - 1 %   Basophils Absolute 0.0 0.0 - 0.1 K/uL  Glucose, capillary     Status: Abnormal   Collection Time: 10/10/14  7:52 AM  Result Value Ref Range   Glucose-Capillary 119 (H) 70 - 99 mg/dL  Glucose, capillary     Status: Abnormal   Collection Time: 10/10/14 11:46 AM  Result Value Ref Range   Glucose-Capillary 131 (H) 70 - 99 mg/dL   Comment 1 Notify RN   Glucose, capillary     Status: Abnormal   Collection Time: 10/10/14  4:29 PM  Result Value Ref Range   Glucose-Capillary 136 (H) 70 - 99 mg/dL   Comment 1 Notify RN   Glucose, capillary     Status: Abnormal   Collection Time: 10/10/14  8:54 PM  Result Value Ref Range   Glucose-Capillary 168 (H) 70 - 99 mg/dL   Comment 1 Notify RN   CBC     Status: Abnormal   Collection Time: 10/11/14  6:10 AM  Result Value Ref Range   WBC 6.6 4.0 - 10.5 K/uL   RBC 3.25 (L)  4.22 - 5.81 MIL/uL   Hemoglobin 9.6 (L) 13.0 - 17.0 g/dL   HCT 28.8 (L) 39.0 - 52.0 %   MCV 88.6 78.0 - 100.0 fL   MCH 29.5 26.0 - 34.0 pg   MCHC 33.3 30.0 - 36.0 g/dL   RDW 13.2 11.5 - 15.5 %   Platelets 50 (L) 150 - 400 K/uL    Comment: DELTA CHECK NOTED SPECIMEN CHECKED FOR CLOTS PLATELET COUNT CONFIRMED BY SMEAR   Basic metabolic panel     Status: Abnormal   Collection Time: 10/11/14  6:10 AM  Result Value Ref Range   Sodium 135 135 - 145 mmol/L   Potassium 3.2 (L) 3.5 - 5.1 mmol/L   Chloride 103 96 - 112 mmol/L   CO2 26 19 - 32 mmol/L   Glucose, Bld 132 (H) 70 - 99 mg/dL   BUN 14 6 - 23 mg/dL   Creatinine, Ser 1.22 0.50 - 1.35 mg/dL   Calcium 7.3 (L) 8.4 - 10.5 mg/dL   GFR calc non Af Amer 64 (L) >90 mL/min   GFR calc Af Amer 74 (L) >90 mL/min    Comment: (NOTE) The eGFR has been calculated using the CKD EPI equation. This calculation has not been validated in all clinical situations. eGFR's persistently <90 mL/min signify possible Chronic Kidney Disease.     Anion gap 6 5 - 15  Glucose, capillary     Status: Abnormal   Collection Time: 10/11/14  7:52 AM  Result Value Ref Range   Glucose-Capillary 140 (H) 70 - 99 mg/dL   Comment 1 Notify RN   Glucose, capillary     Status: Abnormal   Collection Time: 10/11/14 12:17 PM  Result Value Ref Range   Glucose-Capillary 146 (H) 70 - 99 mg/dL    ABGS No results for input(s): PHART, PO2ART, TCO2, HCO3 in the last 72 hours.  Invalid input(s): PCO2 CULTURES Recent Results (from the past 240 hour(s))  Culture, blood (routine x 2)     Status: None   Collection Time: 10/03/14  9:58 AM  Result Value Ref Range Status   Specimen Description BLOOD RIGHT ANTECUBITAL  Final   Special Requests BOTTLES DRAWN AEROBIC AND ANAEROBIC 6CC  Final   Culture   Final    STAPHYLOCOCCUS AUREUS Note: Gram Stain Report Called to,Read Back By and Verified With: LEE B _0  ON 10/04/14 BY WOODIE J SUSCEPTIBILITIES PERFORMED ON PREVIOUS CULTURE WITHIN THE LAST 5 DAYS. Performed at [facility]    Report Status 10/06/2014 FINAL  Final  Culture, blood (routine x 2)     Status: None   Collection Time: 10/03/14  9:59 AM  Result Value Ref Range Status   Specimen Description BLOOD RIGHT ARM  Final   Special Requests BOTTLES DRAWN AEROBIC AND ANAEROBIC 8CC  Final   Culture   Final    METHICILLIN RESISTANT STAPHYLOCOCCUS AUREUS Note: Gram Stain Report Called to,Read Back By and Verified With: LEE B @ 0330 ON 10/04/14 BY WOODIEB RIFAMPIN AND GENTAMICIN SHOULD NOT BE USED AS SINGLE DRUGS FOR TREATMENT OF STAPH INFECTIONS. This organism DOES NOT demonstrate inducible Clindamycin  resistance in vitro. CRITICAL RESULT CALLED TO, READ BACK BY AND VERIFIED WITH: Waynard Reeds RN AT [facility] AT [phone] BY CASTC Performed at [facility]    Report Status 10/06/2014 FINAL  Final   Organism ID, Bacteria METHICILLIN RESISTANT STAPHYLOCOCCUS AUREUS  Final      Susceptibility   Methicillin resistant staphylococcus  aureus -  MIC*    CLINDAMYCIN <=0.25 SENSITIVE Sensitive     ERYTHROMYCIN >=8 RESISTANT Resistant     GENTAMICIN <=0.5 SENSITIVE Sensitive     LEVOFLOXACIN 4 INTERMEDIATE Intermediate     OXACILLIN >=4 RESISTANT Resistant     PENICILLIN >=0.5 RESISTANT Resistant     RIFAMPIN <=0.5 SENSITIVE Sensitive     TRIMETH/SULFA <=10 SENSITIVE Sensitive     VANCOMYCIN <=0.5 SENSITIVE Sensitive     TETRACYCLINE <=1 SENSITIVE Sensitive     * METHICILLIN RESISTANT STAPHYLOCOCCUS AUREUS  Culture, Urine     Status: None   Collection Time: 10/03/14 10:00 AM  Result Value Ref Range Status   Specimen Description URINE, CATHETERIZED  Final   Special Requests NONE  Final   Colony Count NO GROWTH Performed at [facility]   Final   Culture NO GROWTH Performed at [facility]   Final   Report Status 10/04/2014 FINAL  Final  Culture, blood (routine x 2)     Status: None   Collection Time: 10/06/14  6:15 AM  Result Value Ref Range Status   Specimen Description BLOOD RIGHT HAND  Final   Special Requests BOTTLES DRAWN AEROBIC AND ANAEROBIC 8CC  Final   Culture NO GROWTH 5 DAYS  Final   Report Status 10/11/2014 FINAL  Final  Culture, blood (routine x 2)     Status: None   Collection Time: 10/06/14  6:15 AM  Result Value Ref Range Status   Specimen Description BLOOD LEFT HAND  Final   Special Requests BOTTLES DRAWN AEROBIC ONLY 8CC  Final   Culture NO GROWTH 5 DAYS  Final   Report Status 10/11/2014 FINAL  Final   Studies/Results: No results found.  Medications: I have reviewed the patient's current medications.  Assesment:   Principal Problem:   Hyperkalemia Active Problems:   Bipolar I disorder, most recent episode mixed   H/O: stroke   Dehydration   CVA, old, cognitive deficits   CKD (chronic kidney disease) stage 3, GFR 30-59 ml/min   Thrombocytopenia   Acute encephalopathy   Acute renal failure   Hypoglycemia MRSA bacteremia Left jagular  thrombus thrombocytobenia  Plan:  Medications reviewed Will decrease IV fluid. IV antibiotics as per ID recommendation. Supportive care  LOS: 16 days   Derrick Butler 10/11/2014, 12:29 PM

## 2014-10-12 ENCOUNTER — Encounter (HOSPITAL_COMMUNITY): Payer: Self-pay

## 2014-10-12 LAB — BASIC METABOLIC PANEL
Anion gap: 5 (ref 5–15)
BUN: 14 mg/dL (ref 6–23)
CALCIUM: 7.6 mg/dL — AB (ref 8.4–10.5)
CHLORIDE: 102 mmol/L (ref 96–112)
CO2: 28 mmol/L (ref 19–32)
Creatinine, Ser: 1.23 mg/dL (ref 0.50–1.35)
GFR calc non Af Amer: 64 mL/min — ABNORMAL LOW (ref >90)
GFR, EST AFRICAN AMERICAN: 74 mL/min — AB (ref >90)
GLUCOSE: 135 mg/dL — AB (ref 70–99)
Potassium: 3.6 mmol/L (ref 3.5–5.1)
Sodium: 135 mmol/L (ref 135–145)

## 2014-10-12 LAB — PROTEIN ELECTROPHORESIS, SERUM
Albumin ELP: 49.9 % — ABNORMAL LOW (ref 55.8–66.1)
Alpha-1-Globulin: 12 % — ABNORMAL HIGH (ref 2.9–4.9)
Alpha-2-Globulin: 13.9 % — ABNORMAL HIGH (ref 7.1–11.8)
BETA 2: 6.3 % (ref 3.2–6.5)
Beta Globulin: 6.3 % (ref 4.7–7.2)
Gamma Globulin: 11.6 % (ref 11.1–18.8)
M-Spike, %: NOT DETECTED g/dL
Total Protein ELP: 4 g/dL — ABNORMAL LOW (ref 6.0–8.3)

## 2014-10-12 LAB — CBC WITH DIFFERENTIAL/PLATELET
BASOS ABS: 0.1 10*3/uL (ref 0.0–0.1)
BASOS PCT: 1 % (ref 0–1)
Eosinophils Absolute: 0 10*3/uL (ref 0.0–0.7)
Eosinophils Relative: 1 % (ref 0–5)
HCT: 27.6 % — ABNORMAL LOW (ref 39.0–52.0)
Hemoglobin: 9.2 g/dL — ABNORMAL LOW (ref 13.0–17.0)
LYMPHS ABS: 1.3 10*3/uL (ref 0.7–4.0)
Lymphocytes Relative: 23 % (ref 12–46)
MCH: 29.1 pg (ref 26.0–34.0)
MCHC: 33.3 g/dL (ref 30.0–36.0)
MCV: 87.3 fL (ref 78.0–100.0)
Monocytes Absolute: 0.6 10*3/uL (ref 0.1–1.0)
Monocytes Relative: 11 % (ref 3–12)
NEUTROS PCT: 65 % (ref 43–77)
Neutro Abs: 3.8 10*3/uL (ref 1.7–7.7)
PLATELETS: 59 10*3/uL — AB (ref 150–400)
RBC: 3.16 MIL/uL — ABNORMAL LOW (ref 4.22–5.81)
RDW: 13.4 % (ref 11.5–15.5)
WBC: 5.8 10*3/uL (ref 4.0–10.5)

## 2014-10-12 LAB — ANA: Anti Nuclear Antibody(ANA): NEGATIVE

## 2014-10-12 LAB — HIV ANTIBODY (ROUTINE TESTING W REFLEX): HIV Screen 4th Generation wRfx: NONREACTIVE

## 2014-10-12 MED ORDER — CEFTAROLINE FOSAMIL 600 MG IV SOLR
INTRAVENOUS | Status: AC
  Filled 2014-10-12: qty 600

## 2014-10-12 MED ORDER — VALPROATE SODIUM 500 MG/5ML IV SOLN
INTRAVENOUS | Status: AC
  Filled 2014-10-12: qty 5

## 2014-10-12 MED ORDER — SODIUM CHLORIDE 0.9 % IV SOLN
600.0000 mg | Freq: Two times a day (BID) | INTRAVENOUS | Status: DC
  Administered 2014-10-12 – 2014-10-13 (×3): 600 mg via INTRAVENOUS
  Filled 2014-10-12 (×5): qty 600

## 2014-10-12 NOTE — Progress Notes (Signed)
Subjective: Patient is sleepy but wake up on verbal communication. Talked to ID and new recommendation of antibiotic tobe continued until March 4 is made. Planned to get Picc line. Objective: Vital signs in last 24 hours: Temp:  [98.1 F (36.7 C)-98.6 F (37 C)] 98.6 F (37 C) (02/08 0550) Pulse Rate:  [63-81] 63 (02/08 0550) Resp:  [20] 20 (02/08 0550) BP: (130-156)/(60-72) 147/72 mmHg (02/08 0550) SpO2:  [94 %-96 %] 96 % (02/08 0550) Weight change:  Last BM Date: 10/09/14  Intake/Output from previous day: 02/07 0701 - 02/08 0700 In: 0  Out: 1900 [Urine:1900]  PHYSICAL EXAM General appearance: no distress and slowed mentation Resp: diminished breath sounds bilaterally and rhonchi bilaterally Cardio: S1, S2 normal GI: soft, non-tender; bowel sounds normal; no masses,  no organomegaly Extremities: extremities normal, atraumatic, no cyanosis or edema  Lab Results:  Results for orders placed or performed during the hospital encounter of 09/25/14 (from the past 48 hour(s))  Glucose, capillary     Status: Abnormal   Collection Time: 10/10/14  4:29 PM  Result Value Ref Range   Glucose-Capillary 136 (H) 70 - 99 mg/dL   Comment 1 Notify RN   Glucose, capillary     Status: Abnormal   Collection Time: 10/10/14  8:54 PM  Result Value Ref Range   Glucose-Capillary 168 (H) 70 - 99 mg/dL   Comment 1 Notify RN   CBC     Status: Abnormal   Collection Time: 10/11/14  6:10 AM  Result Value Ref Range   WBC 6.6 4.0 - 10.5 K/uL   RBC 3.25 (L) 4.22 - 5.81 MIL/uL   Hemoglobin 9.6 (L) 13.0 - 17.0 g/dL   HCT 28.8 (L) 39.0 - 52.0 %   MCV 88.6 78.0 - 100.0 fL   MCH 29.5 26.0 - 34.0 pg   MCHC 33.3 30.0 - 36.0 g/dL   RDW 13.2 11.5 - 15.5 %   Platelets 50 (L) 150 - 400 K/uL    Comment: DELTA CHECK NOTED SPECIMEN CHECKED FOR CLOTS PLATELET COUNT CONFIRMED BY SMEAR   Basic metabolic panel     Status: Abnormal   Collection Time: 10/11/14  6:10 AM  Result Value Ref Range   Sodium 135 135 -  145 mmol/L   Potassium 3.2 (L) 3.5 - 5.1 mmol/L   Chloride 103 96 - 112 mmol/L   CO2 26 19 - 32 mmol/L   Glucose, Bld 132 (H) 70 - 99 mg/dL   BUN 14 6 - 23 mg/dL   Creatinine, Ser 1.22 0.50 - 1.35 mg/dL   Calcium 7.3 (L) 8.4 - 10.5 mg/dL   GFR calc non Af Amer 64 (L) >90 mL/min   GFR calc Af Amer 74 (L) >90 mL/min    Comment: (NOTE) The eGFR has been calculated using the CKD EPI equation. This calculation has not been validated in all clinical situations. eGFR's persistently <90 mL/min signify possible Chronic Kidney Disease.    Anion gap 6 5 - 15  Glucose, capillary     Status: Abnormal   Collection Time: 10/11/14  7:52 AM  Result Value Ref Range   Glucose-Capillary 140 (H) 70 - 99 mg/dL   Comment 1 Notify RN   Glucose, capillary     Status: Abnormal   Collection Time: 10/11/14 12:17 PM  Result Value Ref Range   Glucose-Capillary 146 (H) 70 - 99 mg/dL  Glucose, capillary     Status: Abnormal   Collection Time: 10/11/14  4:44 PM  Result Value  Ref Range   Glucose-Capillary 122 (H) 70 - 99 mg/dL   Comment 1 Notify RN   Glucose, capillary     Status: None   Collection Time: 10/11/14  8:40 PM  Result Value Ref Range   Glucose-Capillary 95 70 - 99 mg/dL  Basic metabolic panel     Status: Abnormal   Collection Time: 10/12/14  7:46 AM  Result Value Ref Range   Sodium 135 135 - 145 mmol/L   Potassium 3.6 3.5 - 5.1 mmol/L   Chloride 102 96 - 112 mmol/L   CO2 28 19 - 32 mmol/L   Glucose, Bld 135 (H) 70 - 99 mg/dL   BUN 14 6 - 23 mg/dL   Creatinine, Ser 1.23 0.50 - 1.35 mg/dL   Calcium 7.6 (L) 8.4 - 10.5 mg/dL   GFR calc non Af Amer 64 (L) >90 mL/min   GFR calc Af Amer 74 (L) >90 mL/min    Comment: (NOTE) The eGFR has been calculated using the CKD EPI equation. This calculation has not been validated in all clinical situations. eGFR's persistently <90 mL/min signify possible Chronic Kidney Disease.    Anion gap 5 5 - 15  CBC with Differential     Status: Abnormal    Collection Time: 10/12/14  7:46 AM  Result Value Ref Range   WBC 5.8 4.0 - 10.5 K/uL   RBC 3.16 (L) 4.22 - 5.81 MIL/uL   Hemoglobin 9.2 (L) 13.0 - 17.0 g/dL   HCT 27.6 (L) 39.0 - 52.0 %   MCV 87.3 78.0 - 100.0 fL   MCH 29.1 26.0 - 34.0 pg   MCHC 33.3 30.0 - 36.0 g/dL   RDW 13.4 11.5 - 15.5 %   Platelets 59 (L) 150 - 400 K/uL    Comment: SPECIMEN CHECKED FOR CLOTS PLATELET COUNT CONFIRMED BY SMEAR    Neutrophils Relative % 65 43 - 77 %   Neutro Abs 3.8 1.7 - 7.7 K/uL   Lymphocytes Relative 23 12 - 46 %   Lymphs Abs 1.3 0.7 - 4.0 K/uL   Monocytes Relative 11 3 - 12 %   Monocytes Absolute 0.6 0.1 - 1.0 K/uL   Eosinophils Relative 1 0 - 5 %   Eosinophils Absolute 0.0 0.0 - 0.7 K/uL   Basophils Relative 1 0 - 1 %   Basophils Absolute 0.1 0.0 - 0.1 K/uL    ABGS No results for input(s): PHART, PO2ART, TCO2, HCO3 in the last 72 hours.  Invalid input(s): PCO2 CULTURES Recent Results (from the past 240 hour(s))  Culture, blood (routine x 2)     Status: None   Collection Time: 10/03/14  9:58 AM  Result Value Ref Range Status   Specimen Description BLOOD RIGHT ANTECUBITAL  Final   Special Requests BOTTLES DRAWN AEROBIC AND ANAEROBIC 6CC  Final   Culture   Final    STAPHYLOCOCCUS AUREUS Note: Gram Stain Report Called to,Read Back By and Verified With: LEE B @0330  ON 10/04/14 BY WOODIE J SUSCEPTIBILITIES PERFORMED ON PREVIOUS CULTURE WITHIN THE LAST 5 DAYS. Performed at [facility]    Report Status 10/06/2014 FINAL  Final  Culture, blood (routine x 2)     Status: None   Collection Time: 10/03/14  9:59 AM  Result Value Ref Range Status   Specimen Description BLOOD RIGHT ARM  Final   Special Requests BOTTLES DRAWN AEROBIC AND ANAEROBIC 8CC  Final   Culture   Final    METHICILLIN RESISTANT STAPHYLOCOCCUS AUREUS Note: Gram  Stain Report Called to,Read Back By and Verified With: LEE B @ 0330 ON 10/04/14 BY WOODIEB RIFAMPIN AND GENTAMICIN SHOULD NOT BE USED AS SINGLE DRUGS FOR  TREATMENT OF STAPH INFECTIONS. This organism DOES NOT demonstrate inducible Clindamycin  resistance in vitro. CRITICAL RESULT CALLED TO, READ BACK BY AND VERIFIED WITH: Waynard Reeds RN AT [facility] AT [phone] BY [facility] Performed at [facility]    Report Status 10/06/2014 FINAL  Final   Organism ID, Bacteria METHICILLIN RESISTANT STAPHYLOCOCCUS AUREUS  Final      Susceptibility   Methicillin resistant staphylococcus aureus - MIC*    CLINDAMYCIN <=0.25 SENSITIVE Sensitive     ERYTHROMYCIN >=8 RESISTANT Resistant     GENTAMICIN <=0.5 SENSITIVE Sensitive     LEVOFLOXACIN 4 INTERMEDIATE Intermediate     OXACILLIN >=4 RESISTANT Resistant     PENICILLIN >=0.5 RESISTANT Resistant     RIFAMPIN <=0.5 SENSITIVE Sensitive     TRIMETH/SULFA <=10 SENSITIVE Sensitive     VANCOMYCIN <=0.5 SENSITIVE Sensitive     TETRACYCLINE <=1 SENSITIVE Sensitive     * METHICILLIN RESISTANT STAPHYLOCOCCUS AUREUS  Culture, Urine     Status: None   Collection Time: 10/03/14 10:00 AM  Result Value Ref Range Status   Specimen Description URINE, CATHETERIZED  Final   Special Requests NONE  Final   Colony Count NO GROWTH Performed at [facility]   Final   Culture NO GROWTH Performed at [facility]   Final   Report Status 10/04/2014 FINAL  Final  Culture, blood (routine x 2)     Status: None   Collection Time: 10/06/14  6:15 AM  Result Value Ref Range Status   Specimen Description BLOOD RIGHT HAND  Final   Special Requests BOTTLES DRAWN AEROBIC AND ANAEROBIC 8CC  Final   Culture NO GROWTH 5 DAYS  Final   Report Status 10/11/2014 FINAL  Final  Culture, blood (routine x 2)     Status: None   Collection Time: 10/06/14  6:15 AM  Result Value Ref Range Status   Specimen Description BLOOD LEFT HAND  Final   Special Requests BOTTLES DRAWN AEROBIC ONLY 8CC  Final   Culture NO GROWTH 5 DAYS  Final   Report Status 10/11/2014 FINAL  Final   Studies/Results: No results  found.  Medications: I have reviewed the patient's current medications.  Assesment:   Principal Problem:   Hyperkalemia Active Problems:   Bipolar I disorder, most recent episode mixed   H/O: stroke   Dehydration   CVA, old, cognitive deficits   CKD (chronic kidney disease) stage 3, GFR 30-59 ml/min   Thrombocytopenia   Acute encephalopathy   Acute renal failure   Hypoglycemia MRSA bacteremia Left jagular thrombus thrombocytobenia  Plan:  Medications reviewed Will get picc line IV antibiotics as per ID recommendation. Supportive care  LOS: 17 days   Derrick Butler 10/12/2014, 12:12 PM

## 2014-10-12 NOTE — Progress Notes (Signed)
This is not a formal infectious disease consultation but rather informal recommendations given that the patient was found to have MRSA bacteremia.  ID: Derrick Butler is a 59 year old male with history of bipolar d/o and prior CVA (with resultant cognitive deficits) admitted with ARF/hyperkalemia and worsening AMS. He was found to be febrile to 102.33F on 1/30. Due to concern for infection, he was started on vancomycin and piptazo. Infectious work up revealed 2 sets of blood cultures + MRSA.  antibiotics narrowed to vancomycin and his fever curve has trended downward.He was noted to have cellulitis in the left wrist IV site. LUE ultrasound showed thrombus in the superficial venous system of the left arm but also a non-occlusive thrombus in the Left Internal jugular vein. No other source of MRSA infection has been isolated.[name] He has had persistent thrombocytopenia since being started on vancomycin, BL plt initial 100s that had nadir of 24 now at 59. Remains afebrile. Continues to be followed by neurology for encephalopathy..    Imaging: 1. Nonocclusive thrombus in the left jugular vein. This may be acute, or subacute. Has the patient had a recent left IJ approach central line? 2. Positive for superficial venous thrombosis involving the cephalic vein beginning at the elbow and extending up the arm and nearly to the shoulder and within a short segment of the basilic vein just below the elbow. No definite propagation into the deep brachial or axillary veins. 3. Extensive subcutaneous edema in the lower arm and forearm.  Assessment & Plan:   -  Will switch to ceftaroline 600mg  IV q12 for ease of dosing when he goes to SNF. - would recommend to get Neck CT to look any other abscess associated with nonocclusive thrombus of the left jugular vein. In setting of bacteremia, would consider this as lemierre's syndrome. Spoke with Dr. Legrand Rams who felt this may not be feasible without significant sedation given  to patient. - recommend to treat for 6 weeks as complicated bacteremia using day #1 as 2/2 with March 14th as the last day of antibiotics for presumed septic thrombosis with MRSA bacteremia - we will have the patient follow up in the ID clinic as a new patient in early march      [facility] Staphylococcus aureus bacteremia   Staphylococcus aureus bacteremia (SAB) is associated with a high rate of complications and mortality.  Specific aspects of clinical management are critical to optimizing the outcome of patients with SAB.  Therefore, the [facility] Antimicrobial Management Team [facility]) has initiated an intervention aimed at improving the management of SAB at [facility].  To do so, Infectious Diseases physicians are providing an evidence-based consult for the management of all patients with SAB.     Yes No Comments  Perform follow-up blood cultures (even if the patient is afebrile) to ensure clearance of bacteremia [x]  []  2/2 blood cx NGTD        Perform echocardiography to evaluate for endocarditis (transthoracic ECHO is 40-50% sensitive, TEE is > 90% sensitive) [x]  []  TTE does not suggest endocarditis       Ensure source control [x]  []  Repeat blood cx on 2/2 ngtd  Investigate for "metastatic" sites of infection [x]  []   Left IJ thrombus   Change antibiotic therapy to __ceftaroline 600mg  IV Q12 [x]  []  Changed off of dapto due to elevated baseline CK level  Estimated duration of IV antibiotic therapy: 6  wk [x]  []  Using 2/2 as day #1. For presumed septic  thrombosis

## 2014-10-12 NOTE — Progress Notes (Signed)
Patient ID: Derrick Butler, male   DOB: 1956/03/15, 59 y.o.   MRN: [id number]  [place] A. [name], MD     [email or web address]          Derrick Butler is an 59 y.o. male.   Assessment/Plan:  1. Toxic metabolic encephalopathy most likely due to acute renal failure. Dehydration and medication effect compounded by reduced clearance from acute renal impairment are also contributing factors. Also bacteremia now.   2. Agitated confusion and combativeness. Unable to do EEG. We will discontinue since unsuccessful after 2 attempts. The patient currently is on Depakote for behavioral problems but this should also cover for any seizures.  3. Baseline history of dyskinesia. No evidence of tardive dyskinesia or parkinsonism at this time. We will make adjustments in his medications. Because of limited by mouth intake, his Depakote was switched to IV. The haldol will also be switched IM. Will increase haldol.  4. Baseline vascular dementia.  5. 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  6. Fever with MRSA.   7. Thrombocytopenia - suspect multifactorial.   8. Anemia.  9. Hyperglycemia.   10. Elevated CPK. Exact etiology unknown but could be related to the patient's vigorous movements. Clinically does not appear to have a myopathy. We will repeat the CPK.  11. POOR ORAL INTAKE. Improved. We'll continue to observe. If his PO intake improves, we may switch his medications to oral.    The patient has improved his by mouth intake and is also more responsive.   GENERAL: Improved.   HEENT: Supple. Atraumatic normocephalic.   ABDOMEN: soft  EXTREMITIES: No edema. There are bandages/dressing seen involving multiple areas of the legs.   BACK: Normal.  SKIN: Normal by inspection.   MENTAL STATUS: He seems less agitated and with less movements today. He almost denies verbal commands and stated that "I'm doing okay".  CRANIAL NERVES: Pupils are large 6  mm but reactive. Initially pupils were deviated downwards. After the patient woke up, he has full extraocular movements. Coronary reflexes are intact. The patient seemed to not to respond to direct threat bilaterally suggestive of possible visual field impairment. Facial muscle strength symmetric.  MOTOR: Bulk and tone are good. The patient moves both sides vigorously.  COORDINATION: No tremors, dystonia or dysmetria noted. No rigidity, bradykinesia or parkinsonism observed.   SENSATION: Normal to deep painful stimuli.       Objective: Vital signs in last 24 hours: Temp:  [98.1 F (36.7 C)-98.6 F (37 C)] 98.6 F (37 C) (02/08 0550) Pulse Rate:  [63-81] 63 (02/08 0550) Resp:  [20] 20 (02/08 0550) BP: (130-156)/(60-72) 147/72 mmHg (02/08 0550) SpO2:  [94 %-96 %] 96 % (02/08 0550)  Intake/Output from previous day: 02/07 0701 - 02/08 0700 In: 0  Out: 1900 [Urine:1900] Intake/Output this shift:   Nutritional status: Diet Carb Modified   Lab Results: Results for orders placed or performed during the hospital encounter of 09/25/14 (from the past 48 hour(s))  Glucose, capillary     Status: Abnormal   Collection Time: 10/10/14 11:46 AM  Result Value Ref Range   Glucose-Capillary 131 (H) 70 - 99 mg/dL   Comment 1 Notify RN   Glucose, capillary     Status: Abnormal   Collection Time: 10/10/14  4:29 PM  Result Value Ref Range   Glucose-Capillary 136 (H) 70 - 99 mg/dL   Comment 1 Notify RN   Glucose, capillary     Status: Abnormal   Collection Time:  10/10/14  8:54 PM  Result Value Ref Range   Glucose-Capillary 168 (H) 70 - 99 mg/dL   Comment 1 Notify RN   CBC     Status: Abnormal   Collection Time: 10/11/14  6:10 AM  Result Value Ref Range   WBC 6.6 4.0 - 10.5 K/uL   RBC 3.25 (L) 4.22 - 5.81 MIL/uL   Hemoglobin 9.6 (L) 13.0 - 17.0 g/dL   HCT 28.8 (L) 39.0 - 52.0 %   MCV 88.6 78.0 - 100.0 fL   MCH 29.5 26.0 - 34.0 pg   MCHC 33.3 30.0 - 36.0 g/dL   RDW 13.2 11.5 - 15.5 %    Platelets 50 (L) 150 - 400 K/uL    Comment: DELTA CHECK NOTED SPECIMEN CHECKED FOR CLOTS PLATELET COUNT CONFIRMED BY SMEAR   Basic metabolic panel     Status: Abnormal   Collection Time: 10/11/14  6:10 AM  Result Value Ref Range   Sodium 135 135 - 145 mmol/L   Potassium 3.2 (L) 3.5 - 5.1 mmol/L   Chloride 103 96 - 112 mmol/L   CO2 26 19 - 32 mmol/L   Glucose, Bld 132 (H) 70 - 99 mg/dL   BUN 14 6 - 23 mg/dL   Creatinine, Ser 1.22 0.50 - 1.35 mg/dL   Calcium 7.3 (L) 8.4 - 10.5 mg/dL   GFR calc non Af Amer 64 (L) >90 mL/min   GFR calc Af Amer 74 (L) >90 mL/min    Comment: (NOTE) The eGFR has been calculated using the CKD EPI equation. This calculation has not been validated in all clinical situations. eGFR's persistently <90 mL/min signify possible Chronic Kidney Disease.    Anion gap 6 5 - 15  Glucose, capillary     Status: Abnormal   Collection Time: 10/11/14  7:52 AM  Result Value Ref Range   Glucose-Capillary 140 (H) 70 - 99 mg/dL   Comment 1 Notify RN   Glucose, capillary     Status: Abnormal   Collection Time: 10/11/14 12:17 PM  Result Value Ref Range   Glucose-Capillary 146 (H) 70 - 99 mg/dL  Glucose, capillary     Status: Abnormal   Collection Time: 10/11/14  4:44 PM  Result Value Ref Range   Glucose-Capillary 122 (H) 70 - 99 mg/dL   Comment 1 Notify RN   Glucose, capillary     Status: None   Collection Time: 10/11/14  8:40 PM  Result Value Ref Range   Glucose-Capillary 95 70 - 99 mg/dL    Lipid Panel No results for input(s): CHOL, TRIG, HDL, CHOLHDL, VLDL, LDLCALC in the last 72 hours.  Studies/Results:   Medications:  Scheduled Meds: . antiseptic oral rinse  7 mL Mouth Rinse BID  . feeding supplement (ENSURE COMPLETE)  237 mL Oral QHS  . feeding supplement (GLUCERNA SHAKE)  237 mL Oral BID BM  . haloperidol lactate  10 mg Intramuscular 4 times per day  . insulin aspart  0-9 Units Subcutaneous TID WC & HS  . QUEtiapine  200 mg Oral BID  . sodium  chloride  3 mL Intravenous Q12H  . sulfamethoxazole-trimethoprim  240 mg of trimethoprim Intravenous 4 times per day  . valproate sodium  500 mg Intravenous Q12H   Continuous Infusions:   PRN Meds:.acetaminophen **OR** acetaminophen, chlorproMAZINE (THORAZINE) injection, labetalol     LOS: 17 days   Eastyn Dattilo A. [name], M.D.  Diplomate, Tax adviser of Psychiatry and Neurology ( Neurology).

## 2014-10-12 NOTE — Clinical Social Work Note (Signed)
CSW notified Debbie at [facility] that patient was being discharged today and would be returning to the facility via [facility].   CSW left a message for patient's guardian, [name], indicating that patient would be discharged and transported back to [facility] via [facility].   Ambrose Pancoast, [place] [phone]

## 2014-10-12 NOTE — Progress Notes (Signed)
Notified PICC line nurse about order for placement of PICC line today. Attempted to call patients legal guardian to obtain consent because patient is unable to consent for PICC line placement due to confusion. After several attempts was still unable to reach [name] (legal guardian). Will continue to try and get in touch with [name] for consent of PICC line.

## 2014-10-13 LAB — CBC WITH DIFFERENTIAL/PLATELET
BASOS ABS: 0 10*3/uL (ref 0.0–0.1)
Basophils Relative: 0 % (ref 0–1)
EOS PCT: 1 % (ref 0–5)
Eosinophils Absolute: 0.1 10*3/uL (ref 0.0–0.7)
HCT: 26.3 % — ABNORMAL LOW (ref 39.0–52.0)
HEMOGLOBIN: 8.9 g/dL — AB (ref 13.0–17.0)
Lymphocytes Relative: 19 % (ref 12–46)
Lymphs Abs: 1.2 10*3/uL (ref 0.7–4.0)
MCH: 29.9 pg (ref 26.0–34.0)
MCHC: 33.8 g/dL (ref 30.0–36.0)
MCV: 88.3 fL (ref 78.0–100.0)
MONOS PCT: 10 % (ref 3–12)
Monocytes Absolute: 0.6 10*3/uL (ref 0.1–1.0)
Neutro Abs: 4.4 10*3/uL (ref 1.7–7.7)
Neutrophils Relative %: 70 % (ref 43–77)
PLATELETS: 100 10*3/uL — AB (ref 150–400)
RBC: 2.98 MIL/uL — ABNORMAL LOW (ref 4.22–5.81)
RDW: 13.7 % (ref 11.5–15.5)
WBC: 6.3 10*3/uL (ref 4.0–10.5)

## 2014-10-13 LAB — SEROTONIN RELEASE ASSAY (SRA): SRA .2 IU/mL UFH Ser-aCnc: 1 % (ref 0–20)

## 2014-10-13 LAB — GLUCOSE, CAPILLARY
GLUCOSE-CAPILLARY: 106 mg/dL — AB (ref 70–99)
Glucose-Capillary: 107 mg/dL — ABNORMAL HIGH (ref 70–99)
Glucose-Capillary: 109 mg/dL — ABNORMAL HIGH (ref 70–99)
Glucose-Capillary: 85 mg/dL (ref 70–99)
Glucose-Capillary: 160 mg/dL — ABNORMAL HIGH (ref 70–99)

## 2014-10-13 LAB — IMMUNOFIXATION ELECTROPHORESIS
IgA: 129 mg/dL (ref 68–379)
IgG (Immunoglobin G), Serum: 533 mg/dL — ABNORMAL LOW (ref 650–1600)
IgM, Serum: 20 mg/dL — ABNORMAL LOW (ref 41–251)
Total Protein ELP: 3.9 g/dL — ABNORMAL LOW (ref 6.0–8.3)

## 2014-10-13 LAB — HEPARIN INDUCED THROMBOCYTOPENIA PNL: HEPARIN INDUCED PLT AB: 0.622 {OD_unit} — AB (ref 0.000–0.400)

## 2014-10-13 MED ORDER — HALOPERIDOL 2 MG PO TABS
10.0000 mg | ORAL_TABLET | Freq: Four times a day (QID) | ORAL | Status: DC
  Administered 2014-10-13: 10 mg via ORAL
  Filled 2014-10-13: qty 5

## 2014-10-13 MED ORDER — SODIUM CHLORIDE 0.9 % IV SOLN: 600.0000 mg | Freq: Two times a day (BID) | INTRAVENOUS | Status: DC

## 2014-10-13 MED ORDER — DIVALPROEX SODIUM 125 MG PO CPSP
500.0000 mg | ORAL_CAPSULE | Freq: Two times a day (BID) | ORAL | Status: DC
  Administered 2014-10-13: 500 mg via ORAL
  Filled 2014-10-13 (×3): qty 4

## 2014-10-13 NOTE — Progress Notes (Signed)
Patient ID: Derrick Butler, male   DOB: Jul 15, 1956, 59 y.o.   MRN: [id number]  [place] A. [name], MD     [email or web address]          Derrick Butler is an 59 y.o. male.   Assessment/Plan:  1. Toxic metabolic encephalopathy most likely due to acute renal failure. Dehydration and medication effect compounded by reduced clearance from acute renal impairment are also contributing factors. Also bacteremia now.   2. Agitated confusion and combativeness. Improved.  3. Baseline history of dyskinesia. No evidence of tardive dyskinesia or parkinsonism at this time. We will make adjustments in his medications. Because of limited by mouth intake, his Depakote was switched to IV. The haldol will also be switched IM. Will increase haldol.  4. Baseline vascular dementia.  5. 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  6. Fever with MRSA - bacteremia probably setup for 6 weeks of antibiotics. PIC LINE.   7. Thrombocytopenia - suspect multifactorial.   8. Anemia.  9. Hyperglycemia.   10. Elevated CPK. Exact etiology unknown but could be related to the patient's vigorous movements. Clinically does not appear to have a myopathy. We will repeat the CPK.  11. POOR ORAL INTAKE. Improved. Depakote and Haldol had been switched to oral.    The patient has improved his by mouth intake and is also more responsive.   GENERAL: Improved.   HEENT: Supple. Atraumatic normocephalic.   ABDOMEN: soft  EXTREMITIES: No edema. There are bandages/dressing seen involving multiple areas of the legs.   BACK: Normal.  SKIN: Normal by inspection.   MENTAL STATUS: Patient relates in bed with eyes closed. He is not moving around and restless. He seems to really calm this morning. He reports that he is doing okay and follow commands briskly.  CRANIAL NERVES: Pupils are large 6 mm but reactive. Initially pupils were deviated downwards. After the patient woke up, he has  full extraocular movements. Coronary reflexes are intact. The patient seemed to not to respond to direct threat bilaterally suggestive of possible visual field impairment. Facial muscle strength symmetric.  MOTOR: Bulk and tone are good. The patient moves both sides vigorously.  COORDINATION: No tremors, dystonia or dysmetria noted. No rigidity, bradykinesia or parkinsonism observed.   SENSATION: Normal to deep painful stimuli.       Objective: Vital signs in last 24 hours: Temp:  [97.8 F (36.6 C)-98.6 F (37 C)] 97.8 F (36.6 C) (02/08 2153) Pulse Rate:  [75-81] 75 (02/08 2153) Resp:  [18] 18 (02/08 2153) BP: (157-165)/(68-72) 165/72 mmHg (02/08 2153) SpO2:  [96 %-97 %] 97 % (02/08 2153)  Intake/Output from previous day: 02/08 0701 - 02/09 0700 In: 308 [I.V.:3; IV Piggyback:305] Out: 1100 [Urine:1100] Intake/Output this shift: Total I/O In: -  Out: 925 [Urine:925] Nutritional status: Diet Carb Modified   Lab Results: Results for orders placed or performed during the hospital encounter of 09/25/14 (from the past 48 hour(s))  Glucose, capillary     Status: Abnormal   Collection Time: 10/11/14 12:17 PM  Result Value Ref Range   Glucose-Capillary 146 (H) 70 - 99 mg/dL  Glucose, capillary     Status: Abnormal   Collection Time: 10/11/14  4:44 PM  Result Value Ref Range   Glucose-Capillary 122 (H) 70 - 99 mg/dL   Comment 1 Notify RN   Glucose, capillary     Status: None   Collection Time: 10/11/14  8:40 PM  Result Value Ref Range   Glucose-Capillary  95 70 - 99 mg/dL  Basic metabolic panel     Status: Abnormal   Collection Time: 10/12/14  7:46 AM  Result Value Ref Range   Sodium 135 135 - 145 mmol/L   Potassium 3.6 3.5 - 5.1 mmol/L   Chloride 102 96 - 112 mmol/L   CO2 28 19 - 32 mmol/L   Glucose, Bld 135 (H) 70 - 99 mg/dL   BUN 14 6 - 23 mg/dL   Creatinine, Ser 1.23 0.50 - 1.35 mg/dL   Calcium 7.6 (L) 8.4 - 10.5 mg/dL   GFR calc non Af Amer 64 (L) >90 mL/min    GFR calc Af Amer 74 (L) >90 mL/min    Comment: (NOTE) The eGFR has been calculated using the CKD EPI equation. This calculation has not been validated in all clinical situations. eGFR's persistently <90 mL/min signify possible Chronic Kidney Disease.    Anion gap 5 5 - 15  CBC with Differential     Status: Abnormal   Collection Time: 10/12/14  7:46 AM  Result Value Ref Range   WBC 5.8 4.0 - 10.5 K/uL   RBC 3.16 (L) 4.22 - 5.81 MIL/uL   Hemoglobin 9.2 (L) 13.0 - 17.0 g/dL   HCT 27.6 (L) 39.0 - 52.0 %   MCV 87.3 78.0 - 100.0 fL   MCH 29.1 26.0 - 34.0 pg   MCHC 33.3 30.0 - 36.0 g/dL   RDW 13.4 11.5 - 15.5 %   Platelets 59 (L) 150 - 400 K/uL    Comment: SPECIMEN CHECKED FOR CLOTS PLATELET COUNT CONFIRMED BY SMEAR    Neutrophils Relative % 65 43 - 77 %   Neutro Abs 3.8 1.7 - 7.7 K/uL   Lymphocytes Relative 23 12 - 46 %   Lymphs Abs 1.3 0.7 - 4.0 K/uL   Monocytes Relative 11 3 - 12 %   Monocytes Absolute 0.6 0.1 - 1.0 K/uL   Eosinophils Relative 1 0 - 5 %   Eosinophils Absolute 0.0 0.0 - 0.7 K/uL   Basophils Relative 1 0 - 1 %   Basophils Absolute 0.1 0.0 - 0.1 K/uL    Lipid Panel No results for input(s): CHOL, TRIG, HDL, CHOLHDL, VLDL, LDLCALC in the last 72 hours.  Studies/Results:   Medications:  Scheduled Meds: . antiseptic oral rinse  7 mL Mouth Rinse BID  . ceFTAROline (TEFLARO) IV  600 mg Intravenous Q12H  . divalproex  500 mg Oral Q12H  . feeding supplement (ENSURE COMPLETE)  237 mL Oral QHS  . feeding supplement (GLUCERNA SHAKE)  237 mL Oral BID BM  . haloperidol  10 mg Oral QID  . insulin aspart  0-9 Units Subcutaneous TID WC & HS  . QUEtiapine  200 mg Oral BID  . sodium chloride  3 mL Intravenous Q12H   Continuous Infusions:   PRN Meds:.acetaminophen **OR** acetaminophen, chlorproMAZINE (THORAZINE) injection, labetalol     LOS: 18 days   Bodi Palmeri A. [name], M.D.  Diplomate, Tax adviser of Psychiatry and Neurology ( Neurology).

## 2014-10-13 NOTE — Discharge Summary (Signed)
. Physician Discharge Summary  Patient ID: Derrick Butler MRN: [id number] DOB/AGE: May 22, 1956 59 y.o. Primary Care Physician:Kearra Calkin, MD Admit date: 09/25/2014 Discharge date: 10/13/2014    Discharge Diagnoses:   Principal Problem:   Hyperkalemia Active Problems:   Bipolar I disorder, most recent episode mixed   H/O: stroke   Dehydration   CVA, old, cognitive deficits   CKD (chronic kidney disease) stage 3, GFR 30-59 ml/min   Thrombocytopenia   Acute encephalopathy   Acute renal failure   Hypoglycemia MRSA Bacteremia Internal Jaguar vein thrombosis Thrombocytopenia     Medication List    STOP taking these medications        amoxicillin 500 MG capsule  Commonly known as:  AMOXIL     amoxicillin-clavulanate 875-125 MG per tablet  Commonly known as:  AUGMENTIN      TAKE these medications        aspirin 325 MG tablet  Take 325 mg by mouth daily.     baclofen 10 MG tablet  Commonly known as:  LIORESAL  Take 10 mg by mouth 3 (three) times daily.     ceftaroline 600 mg in sodium chloride 0.9 % 250 mL  Inject 600 mg into the vein every 12 (twelve) hours.     cloNIDine 0.3 MG tablet  Commonly known as:  CATAPRES  Take 0.3 mg by mouth daily.     divalproex 125 MG capsule  Commonly known as:  DEPAKOTE SPRINKLE  Take 250 mg by mouth 3 (three) times daily.     feeding supplement (ENSURE COMPLETE) Liqd  Take 237 mLs by mouth at bedtime.     fluticasone 50 MCG/ACT nasal spray  Commonly known as:  FLONASE  Place 2 sprays into the nose daily as needed. For allergies     ibuprofen 800 MG tablet  Commonly known as:  ADVIL,MOTRIN  Take 800 mg by mouth every 6 (six) hours as needed for moderate pain.     labetalol 200 MG tablet  Commonly known as:  NORMODYNE  Take 200 mg by mouth 2 (two) times daily.     QUEtiapine 200 MG tablet  Commonly known as:  SEROQUEL  Take 200 mg by mouth 2 (two) times daily.     ramipril 10 MG tablet  Commonly known as:   ALTACE  Take 10 mg by mouth daily.     risperiDONE 1 MG tablet  Commonly known as:  RISPERDAL  Take 1 tablet (1 mg total) by mouth 3 (three) times daily.     simvastatin 20 MG tablet  Commonly known as:  ZOCOR  Take 20 mg by mouth at bedtime.     temazepam 15 MG capsule  Commonly known as:  RESTORIL  Take 15 mg by mouth at bedtime as needed for sleep.     trihexyphenidyl 5 MG tablet  Commonly known as:  ARTANE  Take 5 mg by mouth 2 (two) times daily with a meal.        Discharged Condition: improved    Consults: neurology, tele-psychiatry, hematology, infectious disease  Significant Diagnostic Studies: Dg Chest 1 View  10/03/2014   CLINICAL DATA:  Acute onset of fever.  Initial encounter.  EXAM: CHEST - 1 VIEW  COMPARISON:  Chest radiograph performed 07/13/2014  FINDINGS: Vascular congestion is noted, with mildly increased interstitial markings. This raises question for mild interstitial edema, though given the patient's symptoms, mild pneumonia could conceivably have a similar appearance. No pleural effusion or pneumothorax is seen.  The  cardiomediastinal silhouette is borderline normal in size. The patient is status post median sternotomy. No acute osseous abnormalities are identified.  IMPRESSION: Vascular congestion, with mildly increased interstitial markings. This raises question for mild interstitial edema, though given the patient's symptoms, mild pneumonia could conceivably have a similar appearance.   Electronically Signed   By: Garald Balding M.D.   On: 10/03/2014 10:21   Ct Head Wo Contrast  09/26/2014   CLINICAL DATA:  Altered mental status. Pt thrashing head and body. Pt was given multiple doses of medication to calm him down. Pt still combative during scan./  EXAM: CT HEAD WITHOUT CONTRAST  TECHNIQUE: Contiguous axial images were obtained from the base of the skull through the vertex without intravenous contrast.  COMPARISON:  07/13/2014  FINDINGS: Ventricles are  normal in overall configuration. There is ex vacuo dilation of the posterior intra or right lateral ventricle or old right temporal occipital infarct. An old lacunar infarct is noted in the left basal ganglia.  There are no parenchymal masses or mass effect. There is no evidence of a recent cortical infarct. No extra-axial masses or abnormal fluid collections.  No intracranial hemorrhage.  Visualized sinuses and mastoid air cells are clear.  IMPRESSION: 1. No acute intracranial abnormalities. 2. Fall right temporal occipital lobe infarct. Old left thalamic lacunar infarct. Stable appearance from the prior head CT.   Electronically Signed   By: Lajean Manes M.D.   On: 09/26/2014 15:23   US Venous Img Upper Uni Left  10/05/2014   CLINICAL DATA:  Left upper extremity pain, redness and purulent drainage at site of prior IV infiltration. Evaluate for DVT.  EXAM: LEFT UPPER EXTREMITY VENOUS DOPPLER ULTRASOUND  TECHNIQUE: Gray-scale sonography with graded compression, as well as color Doppler and duplex ultrasound were performed to evaluate the upper extremity deep venous system from the level of the subclavian vein and including the jugular, axillary, basilic, radial, ulnar and upper cephalic vein. Spectral Doppler was utilized to evaluate flow at rest and with distal augmentation maneuvers.  COMPARISON:  None.  FINDINGS: Contralateral Subclavian Vein: Respiratory phasicity is normal and symmetric with the symptomatic side. No evidence of thrombus. Normal compressibility.  Internal Jugular Vein: Nonocclusive thrombus in the left jugular vein. The vessel is partially compressible.  Subclavian Vein: No evidence of thrombus. Normal compressibility, respiratory phasicity and response to augmentation.  Axillary Vein: No evidence of thrombus. Normal compressibility, respiratory phasicity and response to augmentation.  Cephalic Vein: Echogenic material fills the cephalic vein at the elbow. The vessel is noncompressible. This  is consistent with occlusive superficial thrombosis. Thrombus extends up the arm almost to the shoulder.  Basilic Vein: Segment of incompressible vessel just below the elbow. Echogenic material fills the lumen of the vein consistent with thrombus.  Brachial Veins: No evidence of thrombus. Normal compressibility, respiratory phasicity and response to augmentation.  Radial Veins: No evidence of thrombus. Normal compressibility, respiratory phasicity and response to augmentation.  Ulnar Veins: No evidence of thrombus. Normal compressibility, respiratory phasicity and response to augmentation.  Venous Reflux:  None visualized.  Other Findings: Extensive subcutaneous edema in the lower arm and forearm.  IMPRESSION: 1. Nonocclusive thrombus in the left jugular vein. This may be acute, or subacute. Has the patient had a recent left IJ approach central line? 2. Positive for superficial venous thrombosis involving the cephalic vein beginning at the elbow and extending up the arm and nearly to the shoulder and within a short segment of the basilic vein just below the  elbow. No definite propagation into the deep brachial or axillary veins. 3. Extensive subcutaneous edema in the lower arm and forearm.   Electronically Signed   By: Jacqulynn Cadet M.D.   On: 10/05/2014 16:33    Lab Results: Basic Metabolic Panel:  Recent Labs  10/11/14 0610 10/12/14 0746  NA 135 135  K 3.2* 3.6  CL 103 102  CO2 26 28  GLUCOSE 132* 135*  BUN 14 14  CREATININE 1.22 1.23  CALCIUM 7.3* 7.6*   Liver Function Tests: No results for input(s): AST, ALT, ALKPHOS, BILITOT, PROT, ALBUMIN in the last 72 hours.   CBC:  Recent Labs  10/11/14 0610 10/12/14 0746  WBC 6.6 5.8  NEUTROABS  --  3.8  HGB 9.6* 9.2*  HCT 28.8* 27.6*  MCV 88.6 87.3  PLT 50* 59*    Recent Results (from the past 240 hour(s))  Culture, blood (routine x 2)     Status: None   Collection Time: 10/03/14  9:58 AM  Result Value Ref Range Status    Specimen Description BLOOD RIGHT ANTECUBITAL  Final   Special Requests BOTTLES DRAWN AEROBIC AND ANAEROBIC 6CC  Final   Culture   Final    STAPHYLOCOCCUS AUREUS Note: Gram Stain Report Called to,Read Back By and Verified With: LEE B @0330  ON 10/04/14 BY WOODIE J SUSCEPTIBILITIES PERFORMED ON PREVIOUS CULTURE WITHIN THE LAST 5 DAYS. Performed at [facility]    Report Status 10/06/2014 FINAL  Final  Culture, blood (routine x 2)     Status: None   Collection Time: 10/03/14  9:59 AM  Result Value Ref Range Status   Specimen Description BLOOD RIGHT ARM  Final   Special Requests BOTTLES DRAWN AEROBIC AND ANAEROBIC 8CC  Final   Culture   Final    METHICILLIN RESISTANT STAPHYLOCOCCUS AUREUS Note: Gram Stain Report Called to,Read Back By and Verified With: LEE B @ 0330 ON 10/04/14 BY WOODIEB RIFAMPIN AND GENTAMICIN SHOULD NOT BE USED AS SINGLE DRUGS FOR TREATMENT OF STAPH INFECTIONS. This organism DOES NOT demonstrate inducible Clindamycin  resistance in vitro. CRITICAL RESULT CALLED TO, READ BACK BY AND VERIFIED WITH: Waynard Reeds RN AT [facility] AT [phone] BY CASTC Performed at [facility]    Report Status 10/06/2014 FINAL  Final   Organism ID, Bacteria METHICILLIN RESISTANT STAPHYLOCOCCUS AUREUS  Final      Susceptibility   Methicillin resistant staphylococcus aureus - MIC*    CLINDAMYCIN <=0.25 SENSITIVE Sensitive     ERYTHROMYCIN >=8 RESISTANT Resistant     GENTAMICIN <=0.5 SENSITIVE Sensitive     LEVOFLOXACIN 4 INTERMEDIATE Intermediate     OXACILLIN >=4 RESISTANT Resistant     PENICILLIN >=0.5 RESISTANT Resistant     RIFAMPIN <=0.5 SENSITIVE Sensitive     TRIMETH/SULFA <=10 SENSITIVE Sensitive     VANCOMYCIN <=0.5 SENSITIVE Sensitive     TETRACYCLINE <=1 SENSITIVE Sensitive     * METHICILLIN RESISTANT STAPHYLOCOCCUS AUREUS  Culture, Urine     Status: None   Collection Time: 10/03/14 10:00 AM  Result Value Ref Range Status   Specimen Description URINE,  CATHETERIZED  Final   Special Requests NONE  Final   Colony Count NO GROWTH Performed at [facility]   Final   Culture NO GROWTH Performed at [facility]   Final   Report Status 10/04/2014 FINAL  Final  Culture, blood (routine x 2)     Status: None   Collection Time: 10/06/14  6:15 AM  Result Value Ref Range Status   Specimen Description BLOOD RIGHT HAND  Final   Special Requests BOTTLES DRAWN AEROBIC AND ANAEROBIC 8CC  Final   Culture NO GROWTH 5 DAYS  Final   Report Status 10/11/2014 FINAL  Final  Culture, blood (routine x 2)     Status: None   Collection Time: 10/06/14  6:15 AM  Result Value Ref Range Status   Specimen Description BLOOD LEFT HAND  Final   Special Requests BOTTLES DRAWN AEROBIC ONLY [place]  Final   Culture NO GROWTH 5 DAYS  Final   Report Status 10/11/2014 FINAL  Final     Hospital Course:  This is a 59 years old male patient with history of multiple medical illnesses was admitted to [facility] due to change in mental status, hyperkalemia and acute renal failure. Patient had a very prolonged hospital stay initially due confusion and combativeness. He was in ICU most of the hospital stay. He had to be sedated to control him and continue his treated. During the hospital stay he developed fever and his blood culture grew MRSA. Patient also has his left upper extremities superficial veins and internal jaguar veins thrombosis. Infections disease consult was done his antibiotics adjusted. Patient's mental status has improved. He will be discharged to nursing home with IV antibiotics to continue until 11/06/2014.  Discharge Exam: Blood pressure 165/72, pulse 75, temperature 97.8 F (36.6 C), temperature source Axillary, resp. rate 18, height 5\' 9"  (1.753 m), weight 59 kg (130 lb 1.1 oz), SpO2 97 %.   Disposition:  Nursing home      Signed: Nakyah Erdmann   10/13/2014, 8:12 AM

## 2014-10-13 NOTE — Progress Notes (Signed)
Report called to Marian Sorrow at [facility].

## 2014-10-13 NOTE — Care Management Utilization Note (Signed)
UR completed 

## 2014-10-13 NOTE — Clinical Social Work Note (Addendum)
CSW facilitated discharge.  CSW left a message for patient's guardian advising that patient would be discharged today and taken to [facility] via ambulance.   CSW spoke with Jackelyn Poling at [facility] and advised that patient was being discharged and would be transported back to facility via EMS.  CSW signing off.   Ambrose Pancoast, [place]

## 2014-10-14 LAB — GLUCOSE, CAPILLARY: Glucose-Capillary: 91 mg/dL (ref 70–99)

## 2014-10-15 ENCOUNTER — Inpatient Hospital Stay: Payer: Self-pay | Admitting: Internal Medicine

## 2014-11-09 ENCOUNTER — Encounter: Payer: Self-pay | Admitting: Internal Medicine

## 2014-11-09 ENCOUNTER — Ambulatory Visit (INDEPENDENT_AMBULATORY_CARE_PROVIDER_SITE_OTHER): Payer: Medicaid Other | Admitting: Internal Medicine

## 2014-11-09 VITALS — BP 114/74 | HR 64 | Temp 98.1°F | Ht 70.0 in | Wt 132.0 lb

## 2014-11-09 DIAGNOSIS — R7881 Bacteremia: Secondary | ICD-10-CM | POA: Diagnosis not present

## 2014-11-09 NOTE — Assessment & Plan Note (Signed)
He did have clearance of his blood cultures. He did have an echocardiogram and did not suggest any valve dysfunction or vegetation. He did not get a TEE but did have a prolonged course of antibiotics. Experience with ceftaroline is limited to skin and, pulmonary infection but at this time there is no indication for further antibiotics and he seems to be doing well. No other sources identified and he is not having significant new back pain. He can return on a when necessary basis.

## 2014-11-09 NOTE — Progress Notes (Signed)
   Subjective:    Patient ID: Derrick Butler, male    DOB: 21-Jul-1956, 59 y.o.   MRN: [id number]  HPI This patient comes in for evaluation as a new patient. He was recently hospitalized in [facility] for initially altered mental status and then determined MRSA blood cultures.  He had at time of fever up to 102. He was started on antibiotics with vancomycin and did have a set of blood cultures on February 2 drawn which remained negative.  He was actually sent out on a course of Cefteroline for about 4 weeks from discharge which was about 5 weeks after the first negative blood culture. He comes in here now off of the antibiotics. He has had no fever or chills.   Review of Systems  Constitutional: Negative for fever and chills.  Respiratory: Negative for shortness of breath.   Gastrointestinal: Negative for nausea and diarrhea.  Skin: Negative for rash.  Neurological: Negative for dizziness and light-headedness.       Objective:   Physical Exam  Constitutional: He appears well-developed and well-nourished. No distress.  Eyes: No scleral icterus.  Cardiovascular: Normal rate, regular rhythm and normal heart sounds.   No murmur heard. Pulmonary/Chest: Effort normal and breath sounds normal. No respiratory distress.  Skin: No rash noted.  Psychiatric: He has a normal mood and affect.          Assessment & Plan:

## 2015-11-02 ENCOUNTER — Telehealth: Payer: Self-pay

## 2015-11-02 NOTE — Telephone Encounter (Signed)
[place] SET UP TCS

## 2015-11-04 NOTE — Telephone Encounter (Signed)
I called and spoke to the nurse, Caren Griffins, who said she has not heard of him having problems, it must be for screening. She will have his med list and insurance info faxed to me.

## 2015-11-08 NOTE — Telephone Encounter (Signed)
Faxed note to Caren Griffins to please forward the med list , etc for me to review to get the patient scheduled.

## 2015-11-09 NOTE — Telephone Encounter (Signed)
Received the requested information. Pt will need OV due to meds prior to scheduling a colonoscopy. I called and spoke to [facility] and scheduled the OV with Walden Field, NP for 11/23/2015 at 11:00 AM. Per Estill Bamberg, pt's legal guardian is [name], and she will let her know that she needs to come with pt to the appt.

## 2015-11-23 ENCOUNTER — Ambulatory Visit (INDEPENDENT_AMBULATORY_CARE_PROVIDER_SITE_OTHER): Payer: Medicare Other | Admitting: Nurse Practitioner

## 2015-11-23 ENCOUNTER — Other Ambulatory Visit: Payer: Self-pay

## 2015-11-23 ENCOUNTER — Encounter: Payer: Self-pay | Admitting: Nurse Practitioner

## 2015-11-23 ENCOUNTER — Encounter: Payer: Self-pay | Admitting: Gastroenterology

## 2015-11-23 VITALS — BP 159/84 | HR 72 | Temp 97.5°F | Ht 66.0 in | Wt 183.6 lb

## 2015-11-23 DIAGNOSIS — Z1211 Encounter for screening for malignant neoplasm of colon: Secondary | ICD-10-CM | POA: Diagnosis not present

## 2015-11-23 DIAGNOSIS — R1310 Dysphagia, unspecified: Secondary | ICD-10-CM

## 2015-11-23 NOTE — Assessment & Plan Note (Signed)
Patient with complaints of what appears to be solid food dysphagia which occurs somewhat regularly. Patient is a difficult historian, although his visit his assisted by his power of attorney. When asked by 2 different people in several different ways confirms reports of "food getting stuck halfway down." At this point we will add endoscopy with possible dilation onto his recommended colonoscopy. Return for follow-up in 2 months to follow-up on dysphagia symptoms.  Proceed with EGD +/- dilation in the OR on propofol/MAC with Dr. Oneida Alar in near future: the risks, benefits, and alternatives have been discussed with the patient in detail. The patient states understanding and desires to proceed.  The patient is on Restoril, Risperdal, Seroquel. Due to polypharmacy we'll plan for the procedure and the OR on propofol/MAC to promote adequate sedation.

## 2015-11-23 NOTE — Patient Instructions (Addendum)
1. We will schedule your procedures for you. 2. In the meantime, due to swallowing problems, follow a dysphagia diet (see below). 3. Return for follow-up in 2 months.   Dysphagia Diet Level 3, Mechanically Advanced The dysphagia level 3 diet includes foods that are soft, moist, and can be chopped into 1-inch chunks. This diet is helpful for people with mild swallowing difficulties. It reduces the risk of food getting caught in the windpipe, trachea, or lungs. WHAT DO I NEED TO KNOW ABOUT THIS DIET?  You may eat foods that are soft and moist.  If you were on the dysphagia level 1 or level 2 diets, you may eat any of the foods included on those lists.  Avoid foods that are dry, hard, sticky, chewy, coarse, and crunchy. Also avoid large cuts of food.  Take small bites. Each bite should contain 1 inch or less of food.  Thicken liquids if instructed by your health care provider. Follow your health care provider's instructions on how to do this and to what consistency.  See your dietitian or speech language pathologist regularly for help with your dietary changes. WHAT FOODS CAN I EAT? Grains Moist breads without nuts or seeds. Biscuits, muffins, pancakes, and waffles well-moistened with syrup, jelly, margarine, or butter. Smooth cereals with plenty of milk to moisten them. Moist bread stuffing. Moist rice. Vegetables All cooked, soft vegetables. Shredded lettuce. Tender fried potatoes. Fruits All canned and cooked fruits. Soft, peeled fresh fruits, such as peaches, nectarines, kiwis, cantaloupe, honeydew melon, and watermelon without seeds. Soft berries, such as strawberries. Meat and Other Protein Sources Moist ground or finely diced or sliced meats. Solid, tender cuts of meat. Meatloaf. Hamburger with a bun. Sausage patty. Deli thin-sliced lunch meat. Chicken, egg, or tuna salad sandwich. Sloppy joe. Moist fish. Eggs prepared any way. Casseroles with small chunks of meats, ground meats, or  tender meats. Dairy Cheese spreads without coarse large chunks. Shredded cheese. Cheese slices. Cottage cheese. Milk at the right texture. Smooth frappes. Yogurt without nuts or coconut. Ask your health care provider whether you can have frozen desserts (such as malts or milk shakes) and thin liquids. Sweets/Desserts Soft, smooth, moist desserts. Non-chewy, smooth candy. Jam. Jelly. Honey. Preserves. Ask your health care provider whether you can have frozen desserts. Fats and Oils Butter. Oils. Margarine. Mayonnaise. Gravy. Spreads. Other All seasonings and sweeteners. All sauces without large chunks. The items listed above may not be a complete list of recommended foods or beverages. Contact your dietitian for more options. WHAT FOODS ARE NOT RECOMMENDED? Grains Coarse or dry cereals. Dry breads. Toast. Crackers. Tough, crusty breads, such French bread and baguettes. Tough, crisp fried potatoes. Potato skins. Dry bread stuffing. Granola. Popcorn. Chips. Vegetables All raw vegetables except shredded lettuce. Cooked corn. Rubbery or stiff cooked vegetables. Stringy vegetables, such as celery. Fruits Hard fruits that are difficult to chew, such as apples or pears. Stringy, high-pulp fruits, such as pineapple, papaya, or mango. Fruits with tough skins, such as grapes. Coconut. All dried fruits. Fruit leather. Fruit roll-ups. Fruit snacks. Meat and Other Protein Sources Dry or tough meats or poultry. Dry fish. Fish with bones. Peanut butter. All nuts and seeds. Dairy  Any with nuts, seeds, chocolate chips, dried fruit, coconut, or pineapple. Sweets/Desserts Dry cakes. Chewy or dry cookies. Any with nuts, seeds, dry fruits, coconut, pineapple, or anything dry, sticky, or hard. Chewy caramel. Licorice. Taffy-type candies. Ask your health care provider whether you can have frozen desserts. Fats and Oils Any  with chunks, nuts, seeds, or pineapple. Olives. Angie Fava. Other Soups with tough or large  chunks of meats, poultry, or vegetables. Corn or clam chowder. The items listed above may not be a complete list of foods and beverages to avoid. Contact your dietitian for more information.   This information is not intended to replace advice given to you by your health care provider. Make sure you discuss any questions you have with your health care provider.   Document Released: 08/21/2005 Document Revised: 09/11/2014 Document Reviewed: 08/04/2013 Elsevier Interactive Patient Education [employer].

## 2015-11-23 NOTE — Progress Notes (Signed)
Primary Care Physician:  Rosita Fire, MD Primary Gastroenterologist:  Dr. Oneida Alar  Chief Complaint  Patient presents with  . Colonoscopy  . Dysphagia    HPI:   Derrick Butler is a 60 y.o. male who presents on referral from primary care for screening colonoscopy. Phone triage was attempted and deferred to an office visit due to patient medications. No history of colonoscopy in our system.  Today his visit is assisted by his power of attorney (POA). He has a history of CVA and AMS. Patient has not had a colonoscopy before. Denies abdominal pain. Had some N/V with a GI virus, otherwise denies N/V. Per POA, facility has not notified about hematochezia or melena. Describes a "weakness in his stomach" but has a good appetite, eats near 100% every meal. States he has a problem with food getting stuck and has to move his head around to get it unstuck. Denies regurgitation. States he "goes to the bathroom ok." POA states no notice of changes in bowel habits, diarrhea, constipation. Per POA, no other upper or lower GI symptoms or signs.   Past Medical History  Diagnosis Date  . Aortic dissection ([place])   . Hypertension   . CVA (cerebral infarction)   . Aortic dissection ([place])   . Hypertension   . CVA (cerebral infarction)   . Diabetes mellitus   . Cardiomyopathy   . Hyperglycemia   . Bipolar 1 disorder ([place])   . Hyperkalemia   . Coronary artery disease   . Hyponatremia   . Altered mental status     with psychosis  . Dehydration   . TIA (transient ischemic attack)   . Lack of coordination   . Cardiomyopathy [facility])     Past Surgical History  Procedure Laterality Date  . Ascending aortic aneurysm repair      Current Outpatient Prescriptions  Medication Sig Dispense Refill  . aspirin 325 MG tablet Take 325 mg by mouth daily.      . baclofen (LIORESAL) 10 MG tablet Take 10 mg by mouth 3 (three) times daily.    . cloNIDine (CATAPRES) 0.3 MG tablet Take 0.3 mg by mouth daily.        . divalproex (DEPAKOTE SPRINKLE) 125 MG capsule Take 250 mg by mouth 3 (three) times daily.    . feeding supplement, ENSURE COMPLETE, (ENSURE COMPLETE) LIQD Take 237 mLs by mouth at bedtime.    . fluticasone (FLONASE) 50 MCG/ACT nasal spray Place 2 sprays into the nose daily as needed. For allergies    . ibuprofen (ADVIL,MOTRIN) 800 MG tablet Take 800 mg by mouth every 6 (six) hours as needed for moderate pain.    [name] labetalol (NORMODYNE) 200 MG tablet Take 200 mg by mouth 2 (two) times daily.      . QUEtiapine (SEROQUEL) 200 MG tablet Take 200 mg by mouth 2 (two) times daily.    . ramipril (ALTACE) 10 MG tablet Take 10 mg by mouth daily.      . simvastatin (ZOCOR) 20 MG tablet Take 20 mg by mouth at bedtime.     . temazepam (RESTORIL) 15 MG capsule Take 15 mg by mouth at bedtime as needed for sleep.     . trihexyphenidyl (ARTANE) 5 MG tablet Take 5 mg by mouth 2 (two) times daily with a meal.    . ceftaroline 600 mg in sodium chloride 0.9 % 250 mL Inject 600 mg into the vein every 12 (twelve) hours. (Patient not taking: Reported on 11/09/2014)  48 vial 0  . risperiDONE (RISPERDAL) 1 MG tablet Take 1 tablet (1 mg total) by mouth 3 (three) times daily. 90 tablet 2   No current facility-administered medications for this visit.    Allergies as of 11/23/2015  . (No Known Allergies)    Family History  Problem Relation Age of Onset  . Alzheimer's disease Mother   . Stroke Father   . Heart disease Father   . Bipolar disorder Father   . Bipolar disorder Sister   . Bipolar disorder Sister   . Colon cancer Neg Hx     Social History   Social History  . Marital Status: Single    Spouse Name: N/A  . Number of Children: N/A  . Years of Education: N/A   Occupational History  . Not on file.   Social History Main Topics  . Smoking status: Never Smoker   . Smokeless tobacco: Never Used  . Alcohol Use: No  . Drug Use: No  . Sexual Activity: No   Other Topics Concern  . Not on file    Social History Narrative    Review of Systems: 10-point ROS negative except as per HPI.    Physical Exam: BP 159/84 mmHg  Pulse 72  Temp(Src) 97.5 F (36.4 C) (Oral)  Ht 5\' 6"  (1.676 m)  Wt 183 lb 9.6 oz (83.28 kg)  BMI 29.65 kg/m2 General:   Alert and oriented. Pleasant and cooperative. Well-nourished and well-developed. pleasently conversational, difficult to understand. Head:  Normocephalic and atraumatic. Eyes:  Without icterus, sclera clear and conjunctiva pink.  Ears:  Normal auditory acuity. Cardiovascular:  S1, S2 present without murmurs appreciated. Extremities without clubbing or edema. Respiratory:  Clear to auscultation bilaterally. No wheezes, rales, or rhonchi. No distress.  Gastrointestinal:  +BS, soft, non-tender and non-distended. No HSM noted. No guarding or rebound. No masses appreciated.  Rectal:  Deferred  Musculoskalatal:  Symmetrical without gross deformities. Skin:  Intact without significant lesions or rashes. Neurologic:  Alert and oriented x4;  grossly normal neurologically. Psych:  Alert and cooperative. Normal mood and affect. Heme/Lymph/Immune: No excessive bruising noted.    11/23/2015 12:10 PM   Disclaimer: This note was dictated with voice recognition software. Similar sounding words can inadvertently be transcribed and may not be corrected upon review.

## 2015-11-23 NOTE — Assessment & Plan Note (Signed)
The patient is due for first-ever screening colonoscopy. Generally asymptomatic from a GI standpoint other than dysphagia as described above. We will proceed with screening colonoscopy. Return for follow-up in 2 months.  Proceed with colonoscopy in the OR on propofol/MAC with Dr. Oneida Alar in the near future. The risks, benefits, and alternatives have been discussed in detail with the patient. They state understanding and desire to proceed.   The patient is on Restoril, Risperdal, Seroquel. Due to polypharmacy we'll plan for the procedure and the OR on propofol/MAC to promote adequate sedation.

## 2015-11-24 NOTE — Progress Notes (Signed)
CC'D TO PCP °

## 2015-12-15 NOTE — Patient Instructions (Signed)
Derrick Butler  12/15/2015     @PREFPERIOPPHARMACY @   Your procedure is scheduled on 12/21/2015.  Report to [name] at 8:30 A.M.  Call this number if you have problems the morning of surgery:  [phone]   Remember:  Do not eat food or drink liquids after midnight.  Take these medicines the morning of surgery with A SIP OF WATER Baclofen, Clonidine, Depakote, Flonase, Labetalol, Seroqel, Altace, Risperdal   Do not wear jewelry, make-up or nail polish.  Do not wear lotions, powders, or perfumes.  You may wear deodorant.  Do not shave 48 hours prior to surgery.  Men may shave face and neck.  Do not bring valuables to the hospital.  [facility] is not responsible for any belongings or valuables.  Contacts, dentures or bridgework may not be worn into surgery.  Leave your suitcase in the car.  After surgery it may be brought to your room.  For patients admitted to the hospital, discharge time will be determined by your treatment team.  Patients discharged the day of surgery will not be allowed to drive home.    Please read over the following fact sheets that you were given. Anesthesia Post-op Instructions     PATIENT INSTRUCTIONS POST-ANESTHESIA  IMMEDIATELY FOLLOWING SURGERY:  Do not drive or operate machinery for the first twenty four hours after surgery.  Do not make any important decisions for twenty four hours after surgery or while taking narcotic pain medications or sedatives.  If you develop intractable nausea and vomiting or a severe headache please notify your doctor immediately.  FOLLOW-UP:  Please make an appointment with your surgeon as instructed. You do not need to follow up with anesthesia unless specifically instructed to do so.  WOUND CARE INSTRUCTIONS (if applicable):  Keep a dry clean dressing on the anesthesia/puncture wound site if there is drainage.  Once the wound has quit draining you may leave it open to air.  Generally you should leave the  bandage intact for twenty four hours unless there is drainage.  If the epidural site drains for more than 36-48 hours please call the anesthesia department.  QUESTIONS?:  Please feel free to call your physician or the hospital operator if you have any questions, and they will be happy to assist you.      Esophageal Dilatation Esophageal dilatation is a procedure to open a blocked or narrowed part of the esophagus. The esophagus is the long tube in your throat that carries food and liquid from your mouth to your stomach. The procedure is also called esophageal dilation.  You may need this procedure if you have a buildup of scar tissue in your esophagus that makes it difficult, painful, or even impossible to swallow. This can be caused by gastroesophageal reflux disease (GERD). In rare cases, people need this procedure because they have cancer of the esophagus or a problem with the way food moves through the esophagus. Sometimes you may need to have another dilatation to enlarge the opening of the esophagus gradually. LET [facility] CARE PROVIDER KNOW ABOUT:   Any allergies you have.  All medicines you are taking, including vitamins, herbs, eye drops, creams, and over-the-counter medicines.  Previous problems you or members of your family have had with the use of anesthetics.  Any blood disorders you have.  Previous surgeries you have had.  Medical conditions you have.  Any antibiotic medicines you are required to take before dental procedures. RISKS AND COMPLICATIONS Generally, this is  a safe procedure. However, problems can occur and include:  Bleeding from a tear in the lining of the esophagus.  A hole (perforation) in the esophagus. BEFORE THE PROCEDURE  Do not eat or drink anything after midnight on the night before the procedure or as directed by your health care provider.  Ask your health care provider about changing or stopping your regular medicines. This is especially  important if you are taking diabetes medicines or blood thinners.  Plan to have someone take you home after the procedure. PROCEDURE   You will be given a medicine that makes you relaxed and sleepy (sedative).  A medicine may be sprayed or gargled to numb the back of the throat.  Your health care provider can use various instruments to do an esophageal dilatation. During the procedure, the instrument used will be placed in your mouth and passed down into your esophagus. Options include:  Simple dilators. This instrument is carefully placed in the esophagus to stretch it.  Guided wire bougies. In this method, a flexible tube (endoscope) is used to insert a wire into the esophagus. The dilator is passed over this wire to enlarge the esophagus. Then the wire is removed.  Balloon dilators. An endoscope with a small balloon at the end is passed down into the esophagus. Inflating the balloon gently stretches the esophagus and opens it up. AFTER THE PROCEDURE  Your blood pressure, heart rate, breathing rate, and blood oxygen level will be monitored often until the medicines you were given have worn off.  Your throat may feel slightly sore and will probably still feel numb. This will improve slowly over time.  You will not be allowed to eat or drink until the throat numbness has resolved.  If this is a same-day procedure, you may be allowed to go home once you have been able to drink, urinate, and sit on the edge of the bed without nausea or dizziness.  If this is a same-day procedure, you should have a friend or family member with you for the next 24 hours after the procedure.   This information is not intended to replace advice given to you by your health care provider. Make sure you discuss any questions you have with your health care provider.   Document Released: 10/12/2005 Document Revised: 09/11/2014 Document Reviewed: 12/31/2013 Elsevier Interactive Patient Education 2016 [employer]. Esophagogastroduodenoscopy Esophagogastroduodenoscopy (EGD) is a procedure that is used to examine the lining of the esophagus, stomach, and first part of the small intestine (duodenum). A long, flexible, lighted tube with a camera attached (endoscope) is inserted down the throat to view these organs. This procedure is done to detect problems or abnormalities, such as inflammation, bleeding, ulcers, or growths, in order to treat them. The procedure lasts 5-20 minutes. It is usually an outpatient procedure, but it may need to be performed in a hospital in emergency cases. LET [facility] CARE PROVIDER KNOW ABOUT:  Any allergies you have.  All medicines you are taking, including vitamins, herbs, eye drops, creams, and over-the-counter medicines.  Previous problems you or members of your family have had with the use of anesthetics.  Any blood disorders you have.  Previous surgeries you have had.  Medical conditions you have. RISKS AND COMPLICATIONS Generally, this is a safe procedure. However, problems can occur and include:  Infection.  Bleeding.  Tearing (perforation) of the esophagus, stomach, or duodenum.  Difficulty breathing or not being able to breathe.  Excessive sweating.  Spasms of the  larynx.  Slowed heartbeat.  Low blood pressure. BEFORE THE PROCEDURE  Do not eat or drink anything after midnight on the night before the procedure or as directed by your health care provider.  Do not take your regular medicines before the procedure if your health care provider asks you not to. Ask your health care provider about changing or stopping those medicines.  If you wear dentures, be prepared to remove them before the procedure.  Arrange for someone to drive you home after the procedure. PROCEDURE  A numbing medicine (local anesthetic) may be sprayed in your throat for comfort and to stop you from gagging or coughing.  You will have an IV tube inserted in a vein in  your hand or arm. You will receive medicines and fluids through this tube.  You will be given a medicine to relax you (sedative).  A pain reliever will be given through the IV tube.  A mouth guard may be placed in your mouth to protect your teeth and to keep you from biting on the endoscope.  You will be asked to lie on your left side.  The endoscope will be inserted down your throat and into your esophagus, stomach, and duodenum.  Air will be put through the endoscope to allow your health care provider to clearly view the lining of your esophagus.  The lining of your esophagus, stomach, and duodenum will be examined. During the exam, your health care provider may:  Remove tissue to be examined under a microscope (biopsy) for inflammation, infection, or other medical problems.  Remove growths.  Remove objects (foreign bodies) that are stuck.  Treat any bleeding with medicines or other devices that stop tissues from bleeding (hot cautery, clipping devices).  Widen (dilate) or stretch narrowed areas of your esophagus and stomach.  The endoscope will be withdrawn. AFTER THE PROCEDURE  You will be taken to a recovery area for observation. Your blood pressure, heart rate, breathing rate, and blood oxygen level will be monitored often until the medicines you were given have worn off.  Do not eat or drink anything until the numbing medicine has worn off and your gag reflex has returned. You may choke.  Your health care provider should be able to discuss his or her findings with you. It will take longer to discuss the test results if any biopsies were taken.   This information is not intended to replace advice given to you by your health care provider. Make sure you discuss any questions you have with your health care provider.   Document Released: 12/22/2004 Document Revised: 09/11/2014 Document Reviewed: 07/24/2012 Elsevier Interactive Patient Education 2016 [employer]. Colonoscopy A colonoscopy is an exam to look at the entire large intestine (colon). This exam can help find problems such as tumors, polyps, inflammation, and areas of bleeding. The exam takes about 1 hour.  LET [facility] CARE PROVIDER KNOW ABOUT:   Any allergies you have.  All medicines you are taking, including vitamins, herbs, eye drops, creams, and over-the-counter medicines.  Previous problems you or members of your family have had with the use of anesthetics.  Any blood disorders you have.  Previous surgeries you have had.  Medical conditions you have. RISKS AND COMPLICATIONS  Generally, this is a safe procedure. However, as with any procedure, complications can occur. Possible complications include:  Bleeding.  Tearing or rupture of the colon wall.  Reaction to medicines given during the exam.  Infection (rare). BEFORE THE PROCEDURE  Ask your health care provider about changing or stopping your regular medicines.  You may be prescribed an oral bowel prep. This involves drinking a large amount of medicated liquid, starting the day before your procedure. The liquid will cause you to have multiple loose stools until your stool is almost clear or light green. This cleans out your colon in preparation for the procedure.  Do not eat or drink anything else once you have started the bowel prep, unless your health care provider tells you it is safe to do so.  Arrange for someone to drive you home after the procedure. PROCEDURE   You will be given medicine to help you relax (sedative).  You will lie on your side with your knees bent.  A long, flexible tube with a light and camera on the end (colonoscope) will be inserted through the rectum and into the colon. The camera sends video back to a computer screen as it moves through the colon. The colonoscope also releases carbon dioxide gas to inflate the colon. This helps your health care provider see the area  better.  During the exam, your health care provider may take a small tissue sample (biopsy) to be examined under a microscope if any abnormalities are found.  The exam is finished when the entire colon has been viewed. AFTER THE PROCEDURE   Do not drive for 24 hours after the exam.  You may have a small amount of blood in your stool.  You may pass moderate amounts of gas and have mild abdominal cramping or bloating. This is caused by the gas used to inflate your colon during the exam.  Ask when your test results will be ready and how you will get your results. Make sure you get your test results.   This information is not intended to replace advice given to you by your health care provider. Make sure you discuss any questions you have with your health care provider.   Document Released: 08/18/2000 Document Revised: 06/11/2013 Document Reviewed: 04/28/2013 Elsevier Interactive Patient Education [employer].

## 2015-12-16 ENCOUNTER — Encounter (HOSPITAL_COMMUNITY)
Admission: RE | Admit: 2015-12-16 | Discharge: 2015-12-16 | Disposition: A | Discharge status: Home / Self Care | Payer: Medicare Other | Source: Ambulatory Visit | Attending: Gastroenterology | Admitting: Gastroenterology

## 2015-12-16 ENCOUNTER — Encounter (HOSPITAL_COMMUNITY): Payer: Self-pay

## 2015-12-16 DIAGNOSIS — Z01818 Encounter for other preprocedural examination: Secondary | ICD-10-CM | POA: Diagnosis not present

## 2015-12-16 HISTORY — DX: Other specified health status: Z78.9

## 2015-12-16 LAB — CBC
HCT: 37.5 % — ABNORMAL LOW (ref 39.0–52.0)
Hemoglobin: 13.4 g/dL (ref 13.0–17.0)
MCH: 31.3 pg (ref 26.0–34.0)
MCHC: 35.7 g/dL (ref 30.0–36.0)
MCV: 87.6 fL (ref 78.0–100.0)
Platelets: 114 10*3/uL — ABNORMAL LOW (ref 150–400)
RBC: 4.28 MIL/uL (ref 4.22–5.81)
RDW: 11.8 % (ref 11.5–15.5)
WBC: 5.4 10*3/uL (ref 4.0–10.5)

## 2015-12-16 LAB — BASIC METABOLIC PANEL
ANION GAP: 11 (ref 5–15)
BUN: 26 mg/dL — ABNORMAL HIGH (ref 6–20)
CO2: 23 mmol/L (ref 22–32)
Calcium: 8.8 mg/dL — ABNORMAL LOW (ref 8.9–10.3)
Chloride: 104 mmol/L (ref 101–111)
Creatinine, Ser: 1.71 mg/dL — ABNORMAL HIGH (ref 0.61–1.24)
GFR calc Af Amer: 49 mL/min — ABNORMAL LOW (ref >60)
GFR calc non Af Amer: 42 mL/min — ABNORMAL LOW (ref >60)
GLUCOSE: 359 mg/dL — AB (ref 65–99)
POTASSIUM: 4.9 mmol/L (ref 3.5–5.1)
Sodium: 138 mmol/L (ref 135–145)

## 2015-12-16 LAB — SURGICAL PCR SCREEN
MRSA, PCR: POSITIVE — AB
Staphylococcus aureus: POSITIVE — AB

## 2015-12-16 NOTE — Pre-Procedure Instructions (Signed)
Spoke to [employer], legal guardian for Mr. Wilensky.  Verbalized understanding of procedures and was present at office visit when procedures were discussed.  Gave phone consent to two nurses for procedure to take place.  States she will be here day of procedure and will bring a copy of POA documents.

## 2015-12-16 NOTE — Pre-Procedure Instructions (Signed)
Patient in for PAT and oriented to person only with flight of ideas. He does not know why he is here today. [facility] personal states he has a POA who is responsible for him. Attempted to call POA, [name] at [phone] and [phone], but was unable to reach her. Message left for her to call back to do medical history as well as to come in to sign consent prior to procedure. Called [employer] and spoke to April. She is aware of procedure and has prep instructions as well. Verbally went over time to be here as well as medicines to take that morning and sent copy with transport person as well. She verbalized understanding of this.

## 2015-12-17 ENCOUNTER — Other Ambulatory Visit (HOSPITAL_COMMUNITY): Payer: Self-pay

## 2015-12-18 ENCOUNTER — Emergency Department (HOSPITAL_COMMUNITY): Payer: Medicare Other

## 2015-12-18 ENCOUNTER — Emergency Department (HOSPITAL_COMMUNITY)
Admission: EM | Admit: 2015-12-18 | Discharge: 2015-12-19 | Disposition: A | Discharge status: Home / Self Care | Payer: Medicare Other | Attending: Emergency Medicine | Admitting: Emergency Medicine

## 2015-12-18 ENCOUNTER — Other Ambulatory Visit: Payer: Self-pay

## 2015-12-18 ENCOUNTER — Encounter (HOSPITAL_COMMUNITY): Payer: Self-pay | Admitting: *Deleted

## 2015-12-18 DIAGNOSIS — I2510 Atherosclerotic heart disease of native coronary artery without angina pectoris: Secondary | ICD-10-CM | POA: Diagnosis not present

## 2015-12-18 DIAGNOSIS — I10 Essential (primary) hypertension: Secondary | ICD-10-CM | POA: Insufficient documentation

## 2015-12-18 DIAGNOSIS — Z79891 Long term (current) use of opiate analgesic: Secondary | ICD-10-CM | POA: Insufficient documentation

## 2015-12-18 DIAGNOSIS — E785 Hyperlipidemia, unspecified: Secondary | ICD-10-CM | POA: Diagnosis not present

## 2015-12-18 DIAGNOSIS — F319 Bipolar disorder, unspecified: Secondary | ICD-10-CM | POA: Diagnosis not present

## 2015-12-18 DIAGNOSIS — R451 Restlessness and agitation: Secondary | ICD-10-CM

## 2015-12-18 DIAGNOSIS — Z79899 Other long term (current) drug therapy: Secondary | ICD-10-CM | POA: Insufficient documentation

## 2015-12-18 DIAGNOSIS — R4182 Altered mental status, unspecified: Secondary | ICD-10-CM | POA: Insufficient documentation

## 2015-12-18 DIAGNOSIS — E1165 Type 2 diabetes mellitus with hyperglycemia: Secondary | ICD-10-CM | POA: Diagnosis not present

## 2015-12-18 DIAGNOSIS — Z8673 Personal history of transient ischemic attack (TIA), and cerebral infarction without residual deficits: Secondary | ICD-10-CM | POA: Diagnosis not present

## 2015-12-18 DIAGNOSIS — Z7982 Long term (current) use of aspirin: Secondary | ICD-10-CM | POA: Diagnosis not present

## 2015-12-18 HISTORY — DX: Encephalopathy, unspecified: G93.40

## 2015-12-18 HISTORY — DX: Anemia, unspecified: D64.9

## 2015-12-18 HISTORY — DX: Unspecified psychosis not due to a substance or known physiological condition: F29

## 2015-12-18 HISTORY — DX: Thrombocytopenia, unspecified: D69.6

## 2015-12-18 HISTORY — DX: Hyperlipidemia, unspecified: E78.5

## 2015-12-18 LAB — COMPREHENSIVE METABOLIC PANEL
ALK PHOS: 64 U/L (ref 38–126)
ALT: 11 U/L — AB (ref 17–63)
AST: 16 U/L (ref 15–41)
Albumin: 3.4 g/dL — ABNORMAL LOW (ref 3.5–5.0)
Anion gap: 10 (ref 5–15)
BILIRUBIN TOTAL: 0.5 mg/dL (ref 0.3–1.2)
BUN: 26 mg/dL — ABNORMAL HIGH (ref 6–20)
CALCIUM: 8.3 mg/dL — AB (ref 8.9–10.3)
CO2: 24 mmol/L (ref 22–32)
CREATININE: 1.68 mg/dL — AB (ref 0.61–1.24)
Chloride: 101 mmol/L (ref 101–111)
GFR calc non Af Amer: 43 mL/min — ABNORMAL LOW (ref >60)
GFR, EST AFRICAN AMERICAN: 50 mL/min — AB (ref >60)
Glucose, Bld: 325 mg/dL — ABNORMAL HIGH (ref 65–99)
Potassium: 4.2 mmol/L (ref 3.5–5.1)
Sodium: 135 mmol/L (ref 135–145)
TOTAL PROTEIN: 6.2 g/dL — AB (ref 6.5–8.1)

## 2015-12-18 LAB — CBC
HCT: 35.7 % — ABNORMAL LOW (ref 39.0–52.0)
Hemoglobin: 12.8 g/dL — ABNORMAL LOW (ref 13.0–17.0)
MCH: 31.4 pg (ref 26.0–34.0)
MCHC: 35.9 g/dL (ref 30.0–36.0)
MCV: 87.5 fL (ref 78.0–100.0)
PLATELETS: 105 10*3/uL — AB (ref 150–400)
RBC: 4.08 MIL/uL — AB (ref 4.22–5.81)
RDW: 11.7 % (ref 11.5–15.5)
WBC: 5.1 10*3/uL (ref 4.0–10.5)

## 2015-12-18 LAB — CBG MONITORING, ED: Glucose-Capillary: 287 mg/dL — ABNORMAL HIGH (ref 65–99)

## 2015-12-18 MED ORDER — SODIUM CHLORIDE 0.9 % IV BOLUS (SEPSIS)
1000.0000 mL | Freq: Once | INTRAVENOUS | Status: AC
  Administered 2015-12-18: 1000 mL via INTRAVENOUS

## 2015-12-18 NOTE — ED Provider Notes (Signed)
CSN: [id number]     Arrival date & time 12/18/15  2121 History  By signing my name below, I, Helane Gunther, attest that this documentation has been prepared under the direction and in the presence of Forde Dandy, MD. Electronically Signed: Helane Gunther, ED Scribe. 12/18/2015. 10:13 PM.    Chief Complaint  Patient presents with  . Altered Mental Status   The history is provided by the EMS personnel, a caregiver and the patient. No language interpreter was used.   HPI Comments: Level 5 Caveat (AMS) Derrick Butler is a 60 y.o. male with a PMHx of CVA, TIA, DM,, bipolar 1 disorder, HTN, CAD, cardiomyopathy, and aortic dissection, as well as a PSHx of ascending aortic aneurism repair brought in by ambulance, who presents to the Emergency Department complaining of AMS onset this evening. Pt states he does not know where he is right now, but denies being in pain.   Per pt's nurse at [facility], pt suddenly began screaming out and striking his body against the side-rails of his bed, then began to strike at the staff 10-15 minutes prior to EMS being called. She states pt has a PMHx of the same, which she is typically able to redirect. However, she notes that tonight she was unable to calm the pt. She also reports pt having a PMHx of involuntary movements. She notes pt eats a regular diet and ambulates at their facility.   Per EMS, pt is a resident at [facility], to which they were called in order to evaluate pt for psychiatric behavior and AMS, stating that pt had been agitated with staff. They note that on arrival, they found pt to be cooperative and following commands, noting that pt's speech seemed "garbled" and difficult to understand. They report that according to reports made on 12/14/2015 pt was alert and oriented but at times difficult to understand.    Past Medical History  Diagnosis Date  . Aortic dissection ([place])   . Hypertension   . CVA (cerebral infarction)   . Aortic dissection ([place])   .  Hypertension   . CVA (cerebral infarction)   . Diabetes mellitus   . Cardiomyopathy   . Hyperglycemia   . Bipolar 1 disorder ([place])   . Hyperkalemia   . Coronary artery disease   . Hyponatremia   . Altered mental status     with psychosis  . Dehydration   . TIA (transient ischemic attack)   . Lack of coordination   . Cardiomyopathy ([place])   . Poor historian   . Encephalopathy   . Hyperlipemia   . Psychosis   . Anemia   . Renal disorder   . Thrombocytopenia [facility])    Past Surgical History  Procedure Laterality Date  . Ascending aortic aneurysm repair     Family History  Problem Relation Age of Onset  . Alzheimer's disease Mother   . Stroke Father   . Heart disease Father   . Bipolar disorder Father   . Bipolar disorder Sister   . Bipolar disorder Sister   . Colon cancer Neg Hx    Social History  Substance Use Topics  . Smoking status: Never Smoker   . Smokeless tobacco: Never Used  . Alcohol Use: No    Review of Systems  Unable to perform ROS: Mental status change    Allergies  Review of patient's allergies indicates no known allergies.  Home Medications   Prior to Admission medications   Medication Sig Start Date End  Date Taking? Authorizing Provider  aspirin 325 MG tablet Take 325 mg by mouth daily.     Yes Historical Provider, MD  atorvastatin (LIPITOR) 10 MG tablet Take 10 mg by mouth daily.   Yes Historical Provider, MD  baclofen (LIORESAL) 10 MG tablet Take 10 mg by mouth 3 (three) times daily.   Yes Historical Provider, MD  BIOTIN PO Take 30 mg by mouth daily.    Yes Historical Provider, MD  cloNIDine (CATAPRES) 0.3 MG tablet Take 0.3 mg by mouth daily.     Yes Historical Provider, MD  divalproex (DEPAKOTE SPRINKLE) 125 MG capsule Take 250 mg by mouth 3 (three) times daily.   Yes Historical Provider, MD  fluticasone (FLONASE) 50 MCG/ACT nasal spray Place 2 sprays into the nose daily. For allergies   Yes Historical Provider, MD   HYDROcodone-acetaminophen (NORCO) 7.5-325 MG tablet Take 1 tablet by mouth every 4 (four) hours as needed for moderate pain.   Yes Historical Provider, MD  labetalol (NORMODYNE) 200 MG tablet Take 200 mg by mouth 2 (two) times daily.     Yes Historical Provider, MD  LORazepam (ATIVAN) 2 MG/ML concentrated solution Take 2 mg by mouth every 6 (six) hours as needed for anxiety (aggressive/behavior).   Yes Historical Provider, MD  QUEtiapine (SEROQUEL) 200 MG tablet Take 200 mg by mouth 2 (two) times daily.   Yes Historical Provider, MD  risperiDONE (RISPERDAL) 1 MG tablet Take 1 tablet (1 mg total) by mouth 3 (three) times daily. Patient taking differently: Take 1 mg by mouth 2 (two) times daily.  06/05/13  Yes Cloria Spring, MD  trihexyphenidyl (ARTANE) 5 MG tablet Take 5 mg by mouth 2 (two) times daily with a meal.   Yes Historical Provider, MD   BP 146/81 mmHg  Pulse 57  Temp(Src) 98.5 F (36.9 C) (Oral)  Resp 16  SpO2 99% Physical Exam Physical Exam  Nursing note and vitals reviewed. Constitutional: Well developed, well nourished, non-toxic, and in no acute distress Head: Normocephalic and atraumatic.  Mouth/Throat: Oropharynx is clear and moist.  Neck: Normal range of motion. Neck supple.  Cardiovascular: Normal rate and regular rhythm.   Pulmonary/Chest: Effort normal and breath sounds normal.  Abdominal: Soft. There is no tenderness. There is no rebound and no guarding.  Musculoskeletal: Normal range of motion.  Neurological: Alert, no facial droop, moderately garbled and dysarthric speech, moves all extremities symmetrically, obeys simple commands, disoriented Skin: Skin is warm and dry.  Psychiatric: Cooperative  ED Course  Procedures  DIAGNOSTIC STUDIES: Oxygen Saturation is 99% on RA, normal by my interpretation.    COORDINATION OF CARE: 10:01 PM - Discussed plans to order diagnostic studies.  Labs Review Labs Reviewed  COMPREHENSIVE METABOLIC PANEL - Abnormal;  Notable for the following:    Glucose, Bld 325 (*)    BUN 26 (*)    Creatinine, Ser 1.68 (*)    Calcium 8.3 (*)    Total Protein 6.2 (*)    Albumin 3.4 (*)    ALT 11 (*)    GFR calc non Af Amer 43 (*)    GFR calc Af Amer 50 (*)    All other components within normal limits  CBC - Abnormal; Notable for the following:    RBC 4.08 (*)    Hemoglobin 12.8 (*)    HCT 35.7 (*)    Platelets 105 (*)    All other components within normal limits  CBG MONITORING, ED - Abnormal; Notable for the following:  Glucose-Capillary 287 (*)    All other components within normal limits  URINALYSIS, ROUTINE W REFLEX MICROSCOPIC (NOT AT [facility])    Imaging Review Dg Chest 1 View  12/18/2015  CLINICAL DATA:  Altered mental status EXAM: CHEST 1 VIEW COMPARISON:  10/03/2014 chest radiograph. FINDINGS: Slightly low lung volumes. Sternotomy wires appear aligned and intact. Stable cardiomediastinal silhouette with mild cardiomegaly. No pneumothorax. No pleural effusion. Lungs appear clear, with no acute consolidative airspace disease and no pulmonary edema. IMPRESSION: Stable mild cardiomegaly without overt pulmonary edema. Slightly low lung volumes with no active pulmonary disease. Electronically Signed   By: Ilona Sorrel M.D.   On: 12/18/2015 23:22   Ct Head Wo Contrast  12/18/2015  CLINICAL DATA:  Initial valuation for acute altered mental status. EXAM: CT HEAD WITHOUT CONTRAST TECHNIQUE: Contiguous axial images were obtained from the base of the skull through the vertex without intravenous contrast. COMPARISON:  Prior study from 09/26/2014. FINDINGS: Age appropriate cerebral atrophy present. Encephalomalacia within the right parietal lobe compatible with remote infarct. Additional remote right PCA territory infarct present. Remote lacunar infarcts within the bilateral basal ganglia as well as the left cerebellum. Scattered vascular calcifications within the distal vertebral arteries and carotid siphons.  Encephalomalacia within the right temporal pole, stable. No evidence for acute large vessel territory infarct. No acute intracranial hemorrhage. No mass lesion, midline shift, or mass effect. Ventricular prominence related ex vacuo dilatation of the right lateral ventricle present. No hydrocephalus. No extra-axial fluid collection. Scalp soft tissues demonstrate no acute abnormality. No acute abnormality about the globes and orbits. Mild mucosal thickening within the left maxillary sinus. Paranasal sinuses are otherwise largely clear. No mastoid effusion. Calvarium intact. IMPRESSION: 1. No acute intracranial process. 2. Age-related cerebral atrophy with multiple remote infarcts as above, grossly stable from prior. Electronically Signed   By: Jeannine Boga M.D.   On: 12/18/2015 23:14   I have personally reviewed and evaluated these images and lab results as part of my medical decision-making.   EKG Interpretation   Date/Time:  Saturday December 18 2015 22:20:51 EDT Ventricular Rate:  57 PR Interval:  229 QRS Duration: 114 QT Interval:  497 QTC Calculation: 484 R Axis:   -34 Text Interpretation:  Sinus rhythm Atrial premature complexes Prolonged PR  interval Borderline IVCD with LAD Borderline prolonged QT interval No  significant change since last tracing Confirmed by Julie-Anne Torain MD, Helvi Royals [id number]) on  12/18/2015 10:54:02 PM      MDM   Final diagnoses:  Altered mental status    60 year old male with history of prior CVA, DM, Bipolar disorder, HTN, and CAD who presents with increased agitation. On presentation is not aggressive or significantly agitated, but poorly cooperative. Won't answer questions of orientation but will moves extremities to command and perform other simple commands. He is afebrile with remainder of vital signs normal. Poorly cooperative and slightly agitated with the exam. However, shortly after initial assessment nurses have been able to redirect him to become cooperative and  less agitated. He is speaking, slightly difficult to understand, but is oriented and answering questions appropriately. Seems to be back at his baseline now. Plan to perform an AMS type work up with basic blood work, UA, CXR, and CT head.   CT head negative for acute intracranial processes. No signs of infection on  CXR. With mild AKI, and given IVF. No major electrolyte or metabolic derangements. Pending UA. If UA negative and still baseline mental status with stable vital signs, he  will be appropriate for discharge back to facility.   I personally performed the services described in this documentation, which was scribed in my presence. The recorded information has been reviewed and is accurate.    Forde Dandy, MD 12/19/15 1100

## 2015-12-18 NOTE — ED Notes (Signed)
CT called, pt agitated, restless and uncooperative while attempting to perform scans.  Nurse to radiology. Pt redirected. Scans complete.

## 2015-12-18 NOTE — ED Notes (Signed)
Attempted to call [facility] for a report on pt with no answer, will attempt to call again,

## 2015-12-18 NOTE — ED Notes (Signed)
Per April pt's nurse at [facility] pt started screaming out, hitting himself against the side rails, then started hitting staff about 10-15 minutes prior to calling ems, , April reports that pt has had this happen before and she was able to redirected but not tonight, has hx of involuntary movements, eats a regular diet and ambulates at their facility,

## 2015-12-18 NOTE — ED Notes (Signed)
MD at bedside. 

## 2015-12-18 NOTE — ED Notes (Signed)
Pt is a resident of [facility] who arrived to er by [facility] for further evaluation of altered mental status and psychiatric behavior, ems reports that pt was agitated with staff earlier in the day, [employer] had to be called for pt, on their arrival pt cooperative   commands, speech is "garbled" difficult to understand, note from 12/14/2015 reports that pt was alert and oriented but difficult to understand at times,

## 2015-12-19 DIAGNOSIS — R4182 Altered mental status, unspecified: Secondary | ICD-10-CM | POA: Diagnosis not present

## 2015-12-19 LAB — URINALYSIS, ROUTINE W REFLEX MICROSCOPIC
BILIRUBIN URINE: NEGATIVE
Glucose, UA: >1000 mg/dL — AB
Hgb urine dipstick: NEGATIVE
KETONES UR: NEGATIVE mg/dL
LEUKOCYTES UA: NEGATIVE
NITRITE: NEGATIVE
Protein, ur: NEGATIVE mg/dL
SPECIFIC GRAVITY, URINE: 1.015 (ref 1.005–1.030)
pH: 6 (ref 5.0–8.0)

## 2015-12-19 LAB — URINE MICROSCOPIC-ADD ON

## 2015-12-19 NOTE — ED Provider Notes (Signed)
Pt left at change of shift to get results of urinalysis. Pt has been calm since he had his testing done. Nurses report he is easily redirected. Pt has some difficult speech to understand, but he is trying to hold a conversation.    Results for orders placed or performed during the hospital encounter of 12/18/15  Comprehensive metabolic panel  Urinalysis, Routine w reflex microscopic (not at [facility])  Result Value Ref Range   Color, Urine YELLOW YELLOW   APPearance CLEAR CLEAR   Specific Gravity, Urine 1.015 1.005 - 1.030   pH 6.0 5.0 - 8.0   Glucose, UA >1000 (A) NEGATIVE mg/dL   Hgb urine dipstick NEGATIVE NEGATIVE   Bilirubin Urine NEGATIVE NEGATIVE   Ketones, ur NEGATIVE NEGATIVE mg/dL   Protein, ur NEGATIVE NEGATIVE mg/dL   Nitrite NEGATIVE NEGATIVE   Leukocytes, UA NEGATIVE NEGATIVE  Urine microscopic-add on  Result Value Ref Range   Squamous Epithelial / LPF 0-5 (A) NONE SEEN   WBC, UA 0-5 0 - 5 WBC/hpf   RBC / HPF 0-5 0 - 5 RBC/hpf   Bacteria, UA RARE (A) NONE SEEN  CBG monitoring, ED  Result Value Ref Range   Glucose-Capillary 287 (H) 65 - 99 mg/dL    Diagnoses that have been ruled out:  None  Diagnoses that are still under consideration:  None  Final diagnoses:  Agitation    Plan discharge  Rolland Porter, MD, Barbette Or, MD 12/19/15 [phone]

## 2015-12-19 NOTE — Discharge Instructions (Signed)
His testing tonight does not show any reason for his increased agitation earlier today. Have him rechecked as needed. You may need to talk to his doctor about adjusting his medications.

## 2015-12-20 NOTE — Pre-Procedure Instructions (Signed)
Abnormal labs brought to Dr Domingo Dimes attention.  Blood sugar noted to be 359 on 12/16/15.  Patient is not a known diabetic.  Contacted April Bryant, nurse caring for Mr. Janicki to advise of blood sugar and enquire as to wether this had been addressed.  States she feels it will be addressed today, as they were notified over the weekend of result.  Per Dr Patsey Berthold, April advised to check BS in the am, prior to leaving [facility] and contact us at [phone] with result, as he will not be having procedure if blood sugar is still within unacceptable range.  Verbalized understanding.

## 2015-12-21 ENCOUNTER — Ambulatory Visit (HOSPITAL_COMMUNITY): Payer: Medicare Other | Admitting: Anesthesiology

## 2015-12-21 ENCOUNTER — Encounter (HOSPITAL_COMMUNITY)
Admission: RE | Disposition: A | Discharge status: Skilled Nursing Facility | Payer: Self-pay | Source: Ambulatory Visit | Attending: Gastroenterology

## 2015-12-21 ENCOUNTER — Ambulatory Visit (HOSPITAL_COMMUNITY)
Admission: RE | Admit: 2015-12-21 | Discharge: 2015-12-21 | Disposition: A | Discharge status: Skilled Nursing Facility | Payer: Medicare Other | Source: Ambulatory Visit | Attending: Gastroenterology | Admitting: Gastroenterology

## 2015-12-21 ENCOUNTER — Encounter (HOSPITAL_COMMUNITY): Payer: Self-pay | Admitting: *Deleted

## 2015-12-21 DIAGNOSIS — Z1211 Encounter for screening for malignant neoplasm of colon: Secondary | ICD-10-CM | POA: Diagnosis not present

## 2015-12-21 DIAGNOSIS — E119 Type 2 diabetes mellitus without complications: Secondary | ICD-10-CM | POA: Insufficient documentation

## 2015-12-21 DIAGNOSIS — D128 Benign neoplasm of rectum: Secondary | ICD-10-CM

## 2015-12-21 DIAGNOSIS — K319 Disease of stomach and duodenum, unspecified: Secondary | ICD-10-CM | POA: Insufficient documentation

## 2015-12-21 DIAGNOSIS — E785 Hyperlipidemia, unspecified: Secondary | ICD-10-CM | POA: Insufficient documentation

## 2015-12-21 DIAGNOSIS — D122 Benign neoplasm of ascending colon: Secondary | ICD-10-CM | POA: Diagnosis not present

## 2015-12-21 DIAGNOSIS — K3189 Other diseases of stomach and duodenum: Secondary | ICD-10-CM | POA: Diagnosis not present

## 2015-12-21 DIAGNOSIS — K621 Rectal polyp: Secondary | ICD-10-CM | POA: Insufficient documentation

## 2015-12-21 DIAGNOSIS — I10 Essential (primary) hypertension: Secondary | ICD-10-CM | POA: Diagnosis not present

## 2015-12-21 DIAGNOSIS — K222 Esophageal obstruction: Secondary | ICD-10-CM

## 2015-12-21 DIAGNOSIS — R1310 Dysphagia, unspecified: Secondary | ICD-10-CM | POA: Insufficient documentation

## 2015-12-21 DIAGNOSIS — K2960 Other gastritis without bleeding: Secondary | ICD-10-CM | POA: Insufficient documentation

## 2015-12-21 DIAGNOSIS — Z7982 Long term (current) use of aspirin: Secondary | ICD-10-CM | POA: Diagnosis not present

## 2015-12-21 DIAGNOSIS — Q394 Esophageal web: Secondary | ICD-10-CM | POA: Insufficient documentation

## 2015-12-21 DIAGNOSIS — Z8673 Personal history of transient ischemic attack (TIA), and cerebral infarction without residual deficits: Secondary | ICD-10-CM | POA: Insufficient documentation

## 2015-12-21 DIAGNOSIS — D120 Benign neoplasm of cecum: Secondary | ICD-10-CM | POA: Insufficient documentation

## 2015-12-21 DIAGNOSIS — F319 Bipolar disorder, unspecified: Secondary | ICD-10-CM | POA: Diagnosis not present

## 2015-12-21 HISTORY — PX: ESOPHAGOGASTRODUODENOSCOPY (EGD) WITH PROPOFOL: SHX5813

## 2015-12-21 HISTORY — PX: BIOPSY: SHX5522

## 2015-12-21 HISTORY — PX: SAVORY DILATION: SHX5439

## 2015-12-21 HISTORY — PX: COLONOSCOPY WITH PROPOFOL: SHX5780

## 2015-12-21 HISTORY — PX: POLYPECTOMY: SHX5525

## 2015-12-21 LAB — GLUCOSE, CAPILLARY
Glucose-Capillary: 163 mg/dL — ABNORMAL HIGH (ref 65–99)
Glucose-Capillary: 128 mg/dL — ABNORMAL HIGH (ref 65–99)

## 2015-12-21 SURGERY — COLONOSCOPY WITH PROPOFOL
Anesthesia: Monitor Anesthesia Care

## 2015-12-21 MED ORDER — ONDANSETRON HCL 4 MG/2ML IJ SOLN: 4.0000 mg | Freq: Once | INTRAMUSCULAR | Status: DC | PRN

## 2015-12-21 MED ORDER — MUPIROCIN 2 % EX OINT
1.0000 "application " | TOPICAL_OINTMENT | Freq: Two times a day (BID) | CUTANEOUS | Status: DC
  Administered 2015-12-21: 1 via NASAL

## 2015-12-21 MED ORDER — FENTANYL CITRATE (PF) 100 MCG/2ML IJ SOLN: 25.0000 ug | INTRAMUSCULAR | Status: DC | PRN

## 2015-12-21 MED ORDER — PROPOFOL 10 MG/ML IV BOLUS
INTRAVENOUS | Status: AC
  Filled 2015-12-21: qty 20

## 2015-12-21 MED ORDER — LIDOCAINE HCL (PF) 1 % IJ SOLN
INTRAMUSCULAR | Status: AC
  Filled 2015-12-21: qty 5

## 2015-12-21 MED ORDER — LIDOCAINE VISCOUS 2 % MT SOLN
5.0000 mL | OROMUCOSAL | Status: AC
  Administered 2015-12-21 (×2): 5 mL via OROMUCOSAL

## 2015-12-21 MED ORDER — LACTATED RINGERS IV SOLN
INTRAVENOUS | Status: DC
  Administered 2015-12-21: 09:00:00 via INTRAVENOUS

## 2015-12-21 MED ORDER — MIDAZOLAM HCL 5 MG/5ML IJ SOLN
INTRAMUSCULAR | Status: DC | PRN
  Administered 2015-12-21 (×2): 1 mg via INTRAVENOUS

## 2015-12-21 MED ORDER — MINERAL OIL PO OIL
TOPICAL_OIL | ORAL | Status: AC
  Filled 2015-12-21: qty 30

## 2015-12-21 MED ORDER — LIDOCAINE VISCOUS 2 % MT SOLN
OROMUCOSAL | Status: AC
  Filled 2015-12-21: qty 15

## 2015-12-21 MED ORDER — FENTANYL CITRATE (PF) 100 MCG/2ML IJ SOLN: INTRAMUSCULAR | Status: DC | PRN

## 2015-12-21 MED ORDER — MIDAZOLAM HCL 2 MG/2ML IJ SOLN
INTRAMUSCULAR | Status: AC
  Filled 2015-12-21: qty 2

## 2015-12-21 MED ORDER — PROPOFOL 500 MG/50ML IV EMUL
INTRAVENOUS | Status: DC | PRN
  Administered 2015-12-21: 50 ug/kg/min via INTRAVENOUS
  Administered 2015-12-21: 11:00:00 via INTRAVENOUS

## 2015-12-21 MED ORDER — MIDAZOLAM HCL 2 MG/2ML IJ SOLN
1.0000 mg | INTRAMUSCULAR | Status: DC | PRN
  Administered 2015-12-21: 1 mg via INTRAVENOUS
  Filled 2015-12-21: qty 2

## 2015-12-21 MED ORDER — MUPIROCIN 2 % EX OINT
TOPICAL_OINTMENT | CUTANEOUS | Status: AC
  Filled 2015-12-21: qty 22

## 2015-12-21 MED ORDER — CHLORHEXIDINE GLUCONATE CLOTH 2 % EX PADS: 6.0000 | MEDICATED_PAD | Freq: Every day | CUTANEOUS | Status: DC

## 2015-12-21 NOTE — Anesthesia Procedure Notes (Signed)
Procedure Name: MAC Date/Time: 12/21/2015 10:23 AM Performed by: Andree Elk, AMY A Pre-anesthesia Checklist: Patient identified, Emergency Drugs available, Suction available, Patient being monitored and Timeout performed Oxygen Delivery Method: Simple face mask

## 2015-12-21 NOTE — Transfer of Care (Signed)
Immediate Anesthesia Transfer of Care Note  Patient: Derrick Butler  Procedure(s) Performed: Procedure(s) with comments: COLONOSCOPY WITH PROPOFOL (N/A) - 1000 - moved to 10:15 - office to notify ESOPHAGOGASTRODUODENOSCOPY (EGD) WITH PROPOFOL (N/A) SAVORY DILATION (N/A) POLYPECTOMY - at cecum and ascending colon; rectum BIOPSY - gastric biopsy  Patient Location: PACU  Anesthesia Type:MAC  Level of Consciousness: sedated and patient cooperative  Airway & Oxygen Therapy: Patient Spontanous Breathing and Patient connected to face mask oxygen  Post-op Assessment: Report given to RN and Post -op Vital signs reviewed and stable  Post vital signs: Reviewed and stable  Last Vitals:  Filed Vitals:   12/21/15 1015 12/21/15 1020  BP: 119/63 128/75  Pulse:    Temp:    Resp: 22 28    Complications: No apparent anesthesia complications

## 2015-12-21 NOTE — Discharge Instructions (Signed)
You had 5 polyps removed.  I STRETCHED YOUR ESOPHAGUS. You have erosive gastritis. I biopsied your stomach..    DRINK WATER TO KEEP YOUR URINE LIGHT YELLOW.  CONTINUE YOUR WEIGHT LOSS EFFORTS. LOSE TEN POUNDS.  FOLLOW A HIGH FIBER DIET. AVOID ITEMS THAT CAUSE BLOATING. SEE INFO BELOW.  YOUR BIOPSY RESULTS WILL BE AVAILABLE IN MY CHART APR  21 AND MY OFFICE WILL CONTACT YOU IN 10-14 DAYS WITH YOUR RESULTS.   Follow up in 4 mos.  Next colonoscopy in 3 years.   ENDOSCOPY Care After Read the instructions outlined below and refer to this sheet in the next week. These discharge instructions provide you with general information on caring for yourself after you leave the hospital. While your treatment has been planned according to the most current medical practices available, unavoidable complications occasionally occur. If you have any problems or questions after discharge, call DR. [facility] Shere, [phone].  ACTIVITY  You may resume your regular activity, but move at a slower pace for the next 24 hours.   Take frequent rest periods for the next 24 hours.   Walking will help get rid of the air and reduce the bloated feeling in your belly (abdomen).   No driving for 24 hours (because of the medicine (anesthesia) used during the test).   You may shower.   Do not sign any important legal documents or operate any machinery for 24 hours (because of the anesthesia used during the test).    NUTRITION  Drink plenty of fluids.   You may resume your normal diet as instructed by your doctor.   Begin with a light meal and progress to your normal diet. Heavy or fried foods are harder to digest and may make you feel sick to your stomach (nauseated).   Avoid alcoholic beverages for 24 hours or as instructed.    MEDICATIONS  You may resume your normal medications.   WHAT YOU CAN EXPECT TODAY  Some feelings of bloating in the abdomen.   Passage of more gas than usual.   Spotting of  blood in your stool or on the toilet paper  .  IF YOU HAD POLYPS REMOVED DURING THE ENDOSCOPY:  Eat a soft diet IF YOU HAVE NAUSEA, BLOATING, ABDOMINAL PAIN, OR VOMITING.    FINDING OUT THE RESULTS OF YOUR TEST Not all test results are available during your visit. DR. Oneida Alar WILL CALL YOU WITHIN 14 DAYS OF YOUR PROCEDUE WITH YOUR RESULTS. Do not assume everything is normal if you have not heard from DR. Suraj Ramdass, CALL HER OFFICE AT [phone].  SEEK IMMEDIATE MEDICAL ATTENTION AND CALL THE OFFICE: [phone] IF:  You have more than a spotting of blood in your stool.   Your belly is swollen (abdominal distention).   You are nauseated or vomiting.   You have a temperature over 101F.   You have abdominal pain or discomfort that is severe or gets worse throughout the day.   High-Fiber Diet A high-fiber diet changes your normal diet to include more whole grains, legumes, fruits, and vegetables. Changes in the diet involve replacing refined carbohydrates with unrefined foods. The calorie level of the diet is essentially unchanged. The Dietary Reference Intake (recommended amount) for adult males is 38 grams per day. For adult females, it is 25 grams per day. Pregnant and lactating women should consume 28 grams of fiber per day. Fiber is the intact part of a plant that is not broken down during digestion. Functional fiber is fiber that has  been isolated from the plant to provide a beneficial effect in the body. PURPOSE  Increase stool bulk.   Ease and regulate bowel movements.   Lower cholesterol.  REDUCE RISK OF COLON CANCER  INDICATIONS THAT YOU NEED MORE FIBER  Constipation and hemorrhoids.   Uncomplicated diverticulosis (intestine condition) and irritable bowel syndrome.   Weight management.   As a protective measure against hardening of the arteries (atherosclerosis), diabetes, and cancer.   GUIDELINES FOR INCREASING FIBER IN THE DIET  Start adding fiber to the diet  slowly. A gradual increase of about 5 more grams (2 slices of whole-wheat bread, 2 servings of most fruits or vegetables, or 1 bowl of high-fiber cereal) per day is best. Too rapid an increase in fiber may result in constipation, flatulence, and bloating.   Drink enough water and fluids to keep your urine clear or pale yellow. Water, juice, or caffeine-free drinks are recommended. Not drinking enough fluid may cause constipation.   Eat a variety of high-fiber foods rather than one type of fiber.   Try to increase your intake of fiber through using high-fiber foods rather than fiber pills or supplements that contain small amounts of fiber.   The goal is to change the types of food eaten. Do not supplement your present diet with high-fiber foods, but replace foods in your present diet.   INCLUDE A VARIETY OF FIBER SOURCES  Replace refined and processed grains with whole grains, canned fruits with fresh fruits, and incorporate other fiber sources. White rice, white breads, and most bakery goods contain little or no fiber.   Brown whole-grain rice, buckwheat oats, and many fruits and vegetables are all good sources of fiber. These include: broccoli, Brussels sprouts, cabbage, cauliflower, beets, sweet potatoes, white potatoes (skin on), carrots, tomatoes, eggplant, squash, berries, fresh fruits, and dried fruits.   Cereals appear to be the richest source of fiber. Cereal fiber is found in whole grains and bran. Bran is the fiber-rich outer coat of cereal grain, which is largely removed in refining. In whole-grain cereals, the bran remains. In breakfast cereals, the largest amount of fiber is found in those with "bran" in their names. The fiber content is sometimes indicated on the label.   You may need to include additional fruits and vegetables each day.   In baking, for 1 cup white flour, you may use the following substitutions:   1 cup whole-wheat flour minus 2 tablespoons.   1/2 cup white  flour plus 1/2 cup whole-wheat flour.    Polyps, Colon  A polyp is extra tissue that grows inside your body. Colon polyps grow in the large intestine. The large intestine, also called the colon, is part of your digestive system. It is a long, hollow tube at the end of your digestive tract where your body makes and stores stool. Most polyps are not dangerous. They are benign. This means they are not cancerous. But over time, some types of polyps can turn into cancer. Polyps that are smaller than a pea are usually not harmful. But larger polyps could someday become or may already be cancerous. To be safe, doctors remove all polyps and test them.   PREVENTION There is not one sure way to prevent polyps. You might be able to lower your risk of getting them if you:  Eat more fruits and vegetables and less fatty food.   Do not smoke.   Avoid alcohol.   Exercise every day.   Lose weight if you are overweight.  Eating more calcium and folate can also lower your risk of getting polyps. Some foods that are rich in calcium are milk, cheese, and broccoli. Some foods that are rich in folate are chickpeas, kidney beans, and spinach.

## 2015-12-21 NOTE — H&P (Signed)
Primary Care Physician:  Rosita Fire, MD Primary Gastroenterologist:  Dr. Oneida Alar  Pre-Procedure History & Physical: HPI:  Derrick Butler is a 60 y.o. male here for DYSPHAGIA/screening.  Past Medical History  Diagnosis Date  . Aortic dissection ([place])   . Hypertension   . CVA (cerebral infarction)   . Aortic dissection ([place])   . Hypertension   . CVA (cerebral infarction)   . Diabetes mellitus   . Cardiomyopathy   . Hyperglycemia   . Bipolar 1 disorder ([place])   . Hyperkalemia   . Coronary artery disease   . Hyponatremia   . Altered mental status     with psychosis  . Dehydration   . TIA (transient ischemic attack)   . Lack of coordination   . Cardiomyopathy ([place])   . Poor historian   . Encephalopathy   . Hyperlipemia   . Psychosis   . Anemia   . Renal disorder   . Thrombocytopenia [facility])     Past Surgical History  Procedure Laterality Date  . Ascending aortic aneurysm repair      Prior to Admission medications   Medication Sig Start Date End Date Taking? Authorizing Provider  atorvastatin (LIPITOR) 10 MG tablet Take 10 mg by mouth daily.   Yes Historical Provider, MD  baclofen (LIORESAL) 10 MG tablet Take 10 mg by mouth 3 (three) times daily.   Yes Historical Provider, MD  BIOTIN PO Take 30 mg by mouth daily.    Yes Historical Provider, MD  cloNIDine (CATAPRES) 0.3 MG tablet Take 0.3 mg by mouth daily.     Yes Historical Provider, MD  divalproex (DEPAKOTE SPRINKLE) 125 MG capsule Take 250 mg by mouth 3 (three) times daily.   Yes Historical Provider, MD  fluticasone (FLONASE) 50 MCG/ACT nasal spray Place 2 sprays into the nose daily. For allergies   Yes Historical Provider, MD  labetalol (NORMODYNE) 200 MG tablet Take 200 mg by mouth 2 (two) times daily.     Yes Historical Provider, MD  QUEtiapine (SEROQUEL) 200 MG tablet Take 200 mg by mouth 2 (two) times daily.   Yes Historical Provider, MD  risperiDONE (RISPERDAL) 1 MG tablet Take 1 tablet (1 mg total) by mouth 3  (three) times daily. Patient taking differently: Take 1 mg by mouth 2 (two) times daily.  06/05/13  Yes Cloria Spring, MD  trihexyphenidyl (ARTANE) 5 MG tablet Take 5 mg by mouth 2 (two) times daily with a meal.   Yes Historical Provider, MD  aspirin 325 MG tablet Take 325 mg by mouth daily.      Historical Provider, MD  HYDROcodone-acetaminophen (NORCO) 7.5-325 MG tablet Take 1 tablet by mouth every 4 (four) hours as needed for moderate pain.    Historical Provider, MD  LORazepam (ATIVAN) 2 MG/ML concentrated solution Take 2 mg by mouth every 6 (six) hours as needed for anxiety (aggressive/behavior).    Historical Provider, MD    Allergies as of 11/23/2015  . (No Known Allergies)    Family History  Problem Relation Age of Onset  . Alzheimer's disease Mother   . Stroke Father   . Heart disease Father   . Bipolar disorder Father   . Bipolar disorder Sister   . Bipolar disorder Sister   . Colon cancer Neg Hx     Social History   Social History  . Marital Status: Single    Spouse Name: N/A  . Number of Children: N/A  . Years of Education: N/A   Occupational  History  . Not on file.   Social History Main Topics  . Smoking status: Never Smoker   . Smokeless tobacco: Never Used  . Alcohol Use: No  . Drug Use: No  . Sexual Activity: No   Other Topics Concern  . Not on file   Social History Narrative    Review of Systems: See HPI, otherwise negative ROS   Physical Exam: BP 119/69 mmHg  Pulse 60  Temp(Src) 98 F (36.7 C) (Oral)  Resp 19  Ht 5\' 6"  (1.676 m)  Wt 182 lb (82.555 kg)  BMI 29.39 kg/m2  SpO2 96% General:   Alert,  pleasant and cooperative in NAD Head:  Normocephalic and atraumatic. Neck:  Supple; Lungs:  Clear throughout to auscultation.    Heart:  Regular rate and rhythm. Abdomen:  Soft, nontender and nondistended. Normal bowel sounds, without guarding, and without rebound.   Neurologic:  Alert and  oriented x4;  grossly normal  neurologically.  Impression/Plan:     DYSPHAGIA/screening  PLAN:  EGD/DIL/tcs TODAY

## 2015-12-21 NOTE — Anesthesia Preprocedure Evaluation (Signed)
Anesthesia Evaluation  Patient identified by MRN, date of birth, ID band Patient confused    Reviewed: Allergy & Precautions, NPO status , Patient's Chart, lab work & pertinent test results, reviewed documented beta blocker date and time , Unable to perform ROS - Chart review only  Airway Mallampati: III  TM Distance: >3 FB     Dental  (+) Teeth Intact, Chipped   Pulmonary pneumonia, resolved,    Pulmonary exam normal        Cardiovascular hypertension, Pt. on medications and Pt. on home beta blockers + CAD and + Peripheral Vascular Disease  Normal cardiovascular exam     Neuro/Psych Bipolar Disorder Schizophrenia TIACVA, Residual Symptoms    GI/Hepatic   Endo/Other  diabetes, Poorly Controlled  Renal/GU Renal InsufficiencyRenal disease     Musculoskeletal   Abdominal Normal abdominal exam  (+)   Peds  Hematology  (+) anemia ,   Anesthesia Other Findings Patient not cooperative with oral exam.  Reproductive/Obstetrics                             Anesthesia Physical Anesthesia Plan  ASA: III  Anesthesia Plan: MAC   Post-op Pain Management:    Induction:   Airway Management Planned: Nasal Cannula  Additional Equipment:   Intra-op Plan:   Post-operative Plan:   Informed Consent:   History available from chart only  Plan Discussed with: CRNA  Anesthesia Plan Comments:         Anesthesia Quick Evaluation

## 2015-12-21 NOTE — Anesthesia Postprocedure Evaluation (Signed)
Anesthesia Post Note  Patient: Derrick Butler  Procedure(s) Performed: Procedure(s) (LRB): COLONOSCOPY WITH PROPOFOL (N/A) ESOPHAGOGASTRODUODENOSCOPY (EGD) WITH PROPOFOL (N/A) SAVORY DILATION (N/A) POLYPECTOMY BIOPSY  Patient location during evaluation: PACU Anesthesia Type: MAC Level of consciousness: sedated Pain management: pain level controlled Vital Signs Assessment: post-procedure vital signs reviewed and stable Respiratory status: spontaneous breathing and patient connected to face mask oxygen Cardiovascular status: stable Postop Assessment: no signs of nausea or vomiting Anesthetic complications: no    Last Vitals:  Filed Vitals:   12/21/15 1015 12/21/15 1020  BP: 119/63 128/75  Pulse:    Temp:    Resp: 22 28    Last Pain: There were no vitals filed for this visit.               Chieko Neises A

## 2015-12-21 NOTE — Progress Notes (Signed)
REVIEWED-NO ADDITIONAL RECOMMENDATIONS. 

## 2015-12-22 ENCOUNTER — Encounter (HOSPITAL_COMMUNITY): Payer: Self-pay | Admitting: Gastroenterology

## 2015-12-23 NOTE — Op Note (Signed)
[facility] Patient Name: Derrick Butler Procedure Date: 12/21/2015 10:24 AM MRN: [id number] Date of Birth: 12-Oct-1955 Attending MD: Barney Drain , MD CSN: [id number] Age: 60 Admit Type: Outpatient Procedure:                Colonoscopy Indications:              Screening for colorectal malignant neoplasm Providers:                Barney Drain, MD, Lurline Del, RN, Georgeann Oppenheim,                            Technician Referring MD:             Rosita Fire Medicines:                Propofol per Anesthesia Complications:            No immediate complications. Estimated Blood Loss:     Estimated blood loss was minimal. Procedure:                Pre-Anesthesia Assessment:                           - Prior to the procedure, a History and Physical                            was performed, and patient medications and                            allergies were reviewed. The patient's tolerance of                            previous anesthesia was also reviewed. The risks                            and benefits of the procedure and the sedation                            options and risks were discussed with the patient.                            All questions were answered, and informed consent                            was obtained. Prior Anticoagulants: The patient has                            taken aspirin, last dose was day of procedure. ASA                            Grade Assessment: II - A patient with mild systemic                            disease. After reviewing the risks and benefits,  the patient was deemed in satisfactory condition to                            undergo the procedure.                           After obtaining informed consent, the colonoscope                            was passed under direct vision. Throughout the                            procedure, the patient's blood pressure, pulse, and                            oxygen  saturations were monitored continuously. The                            [id number] scope was introduced through                            the anus and advanced to the the cecum, identified                            by appendiceal orifice and ileocecal valve. The                            ileocecal valve, appendiceal orifice, and rectum                            were photographed. The colonoscopy was somewhat                            difficult due to a tortuous colon. Successful                            completion of the procedure was aided by changing                            the patient to a supine position and applying                            abdominal pressure. The quality of the bowel                            preparation was excellent. Scope In: 10:40:52 AM Scope Out: 11:09:58 AM Scope Withdrawal Time: 0 hours 22 minutes 9 seconds  Total Procedure Duration: 0 hours 29 minutes 6 seconds  Findings:      The digital rectal exam was normal.      A 3 mm polyp was found in the ascending colon. The polyp was sessile.       The polyp was removed with a cold biopsy forceps. Resection and       retrieval were complete.      Four  sessile polyps were found in the rectum, ascending colon and cecum.       The polyps were 6 to 8 mm in size. These polyps were removed with a hot       snare. Resection and retrieval were complete. Impression:               - One 3 mm polyp in the ascending colon, removed                            with a cold biopsy forceps. Resected and retrieved.                           - Four 6 to 8 mm polyps in the rectum, in the                            ascending colon and in the cecum, removed with a                            hot snare. Resected and retrieved. Moderate Sedation:      Per Anesthesia Care Recommendation:           - Patient has a contact number available for                            emergencies. The signs and symptoms of potential                             delayed complications were discussed with the                            patient. Return to normal activities tomorrow.                            Written discharge instructions were provided to the                            patient.                           - High fiber diet.                           - Continue present medications.                           - Await pathology results.                           - Repeat colonoscopy in 3 years for surveillance.                           - Return to my office in 4 months.                           DRINK WATER TO KEEP YOUR URINE LIGHT YELLOW.  CONTINUE YOUR WEIGHT LOSS EFFORTS. LOSE TEN POUNDS. Procedure Code(s):        --- Professional ---                           [phone], Colonoscopy, flexible; with removal of                            tumor(s), polyp(s), or other lesion(s) by snare                            technique                           45380, 39, Colonoscopy, flexible; with biopsy,                            single or multiple Diagnosis Code(s):        --- Professional ---                           Z12.11, Encounter for screening for malignant                            neoplasm of colon                           D12.2, Benign neoplasm of ascending colon                           K62.1, Rectal polyp                           D12.0, Benign neoplasm of cecum CPT copyright 2016 [employer]. All rights reserved. The codes documented in this report are preliminary and upon coder review may  be revised to meet current compliance requirements. Barney Drain, MD Barney Drain, MD 12/23/2015 1:55:35 PM This report has been signed electronically. Number of Addenda: 0

## 2015-12-23 NOTE — Op Note (Signed)
[facility] Patient Name: Derrick Butler Procedure Date: 12/21/2015 10:23 AM MRN: [id number] Date of Birth: [birth year] Attending MD: Barney Drain , MD CSN: [id number] Age: 60 Admit Type: Outpatient Procedure:                Upper GI endoscopy Indications:              Dysphagia Providers:                Barney Drain, MD, Lurline Del, RN, Georgeann Oppenheim,                            Technician Referring MD:             Rosita Fire Medicines:                Propofol per Anesthesia Complications:            No immediate complications. Estimated Blood Loss:     Estimated blood loss was minimal. Procedure:                Pre-Anesthesia Assessment:                           - Prior to the procedure, a History and Physical                            was performed, and patient medications and                            allergies were reviewed. The patient's tolerance of                            previous anesthesia was also reviewed. The risks                            and benefits of the procedure and the sedation                            options and risks were discussed with the patient.                            All questions were answered, and informed consent                            was obtained. Prior Anticoagulants: The patient has                            taken aspirin, last dose was day of procedure. ASA                            Grade Assessment: II - A patient with mild systemic                            disease. After reviewing the risks and benefits,  the patient was deemed in satisfactory condition to                            undergo the procedure.                           After obtaining informed consent, the endoscope was                            passed under direct vision. Throughout the                            procedure, the patient's blood pressure, pulse, and                            oxygen saturations were monitored  continuously. The                            [id number] scope was introduced through the                            mouth, and advanced to the second part of duodenum.                            The upper GI endoscopy was accomplished without                            difficulty. The patient tolerated the procedure                            well. 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 A guidewire was placed and the       scope was withdrawn. Dilation was performed with a Savary dilator with       mild resistance at 15 mm, 16 mm and 17 mm.      Multiple dispersed small erosions were found in the gastric antrum.       Biopsies were taken with a cold forceps for Helicobacter pylori testing.      The duodenal bulb and second portion of the duodenum were normal. 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                           - Erosive gastropathy. Biopsied.                           - Normal duodenal bulb and second portion of the                            duodenum. Moderate Sedation:      Moderate (conscious) sedation was personally administered by an       anesthesia professional. The following parameters were monitored: oxygen  saturation, heart rate, blood pressure, and response to care. Recommendation:           - Patient has a contact number available for                            emergencies. The signs and symptoms of potential                            delayed complications were discussed with the                            patient. Return to normal activities tomorrow.                            Written discharge instructions were provided to the                            patient.                           - High fiber diet.                           - Continue present medications.                           - Await  pathology results.                           - Return to my office in 4 months. Procedure Code(s):        --- Professional ---                           [phone], Esophagogastroduodenoscopy, flexible,                            transoral; with insertion of guide wire followed by                            passage of dilator(s) through esophagus over guide                            wire                           43239, Esophagogastroduodenoscopy, flexible,                            transoral; with biopsy, single or multiple Diagnosis Code(s):        --- Professional ---                           Q39.4, Esophageal web                           K31.89, Other diseases of stomach and duodenum  R13.10, Dysphagia, unspecified CPT copyright 2016 American Medical Association. All rights reserved. The codes documented in this report are preliminary and upon coder review may  be revised to meet current compliance requirements. Barney Drain, MD Barney Drain, MD 12/23/2015 1:59:20 PM This report has been signed electronically. Number of Addenda: 0

## 2015-12-31 ENCOUNTER — Telehealth: Payer: Self-pay | Admitting: Gastroenterology

## 2015-12-31 MED ORDER — OMEPRAZOLE 20 MG PO CPDR: DELAYED_RELEASE_CAPSULE | ORAL | Status: DC

## 2015-12-31 NOTE — Telephone Encounter (Signed)
Please call pt'S POA. HE had THREE simple adenomas AND TWO HYPERPLASTIC POLYPS REMOVED. His stomach Bx shows  gastritis DUE TO ASPIRIN.      DRINK WATER TO KEEP YOUR URINE LIGHT YELLOW.  CONTINUE YOUR WEIGHT LOSS EFFORTS. LOSE TEN POUNDS.  FOLLOW A HIGH FIBER DIET. AVOID ITEMS THAT CAUSE BLOATING.   Follow up in 4 mos E30 DYSPHAGIA/GASTRITIS.  Next colonoscopy in 3 years.

## 2015-12-31 NOTE — Telephone Encounter (Signed)
PT HAS FU IN MAY WITH ERIC GILL, ON RECALL

## 2015-12-31 NOTE — Telephone Encounter (Signed)
Reminder in epic °

## 2016-01-03 NOTE — Telephone Encounter (Signed)
Tried to call legal guardian, [name] @ [phone] and Mail box full.

## 2016-01-09 ENCOUNTER — Emergency Department (HOSPITAL_COMMUNITY): Payer: Medicare Other

## 2016-01-09 ENCOUNTER — Encounter (HOSPITAL_COMMUNITY): Payer: Self-pay | Admitting: *Deleted

## 2016-01-09 ENCOUNTER — Inpatient Hospital Stay (HOSPITAL_COMMUNITY)
Admission: EM | Admit: 2016-01-09 | Discharge: 2016-01-12 | DRG: 682 | Disposition: A | Discharge status: Skilled Nursing Facility | Payer: Medicare Other | Attending: Family Medicine | Admitting: Family Medicine

## 2016-01-09 DIAGNOSIS — D696 Thrombocytopenia, unspecified: Secondary | ICD-10-CM | POA: Diagnosis present

## 2016-01-09 DIAGNOSIS — Z8673 Personal history of transient ischemic attack (TIA), and cerebral infarction without residual deficits: Secondary | ICD-10-CM | POA: Diagnosis not present

## 2016-01-09 DIAGNOSIS — N289 Disorder of kidney and ureter, unspecified: Secondary | ICD-10-CM

## 2016-01-09 DIAGNOSIS — W06XXXA Fall from bed, initial encounter: Secondary | ICD-10-CM | POA: Diagnosis present

## 2016-01-09 DIAGNOSIS — N179 Acute kidney failure, unspecified: Secondary | ICD-10-CM | POA: Diagnosis present

## 2016-01-09 DIAGNOSIS — N183 Chronic kidney disease, stage 3 unspecified: Secondary | ICD-10-CM | POA: Diagnosis present

## 2016-01-09 DIAGNOSIS — F3160 Bipolar disorder, current episode mixed, unspecified: Secondary | ICD-10-CM | POA: Diagnosis present

## 2016-01-09 DIAGNOSIS — E1310 Other specified diabetes mellitus with ketoacidosis without coma: Secondary | ICD-10-CM | POA: Diagnosis present

## 2016-01-09 DIAGNOSIS — Z818 Family history of other mental and behavioral disorders: Secondary | ICD-10-CM | POA: Diagnosis not present

## 2016-01-09 DIAGNOSIS — Z823 Family history of stroke: Secondary | ICD-10-CM | POA: Diagnosis not present

## 2016-01-09 DIAGNOSIS — Z8249 Family history of ischemic heart disease and other diseases of the circulatory system: Secondary | ICD-10-CM

## 2016-01-09 DIAGNOSIS — E860 Dehydration: Secondary | ICD-10-CM | POA: Diagnosis present

## 2016-01-09 DIAGNOSIS — Z7951 Long term (current) use of inhaled steroids: Secondary | ICD-10-CM

## 2016-01-09 DIAGNOSIS — Z7984 Long term (current) use of oral hypoglycemic drugs: Secondary | ICD-10-CM | POA: Diagnosis not present

## 2016-01-09 DIAGNOSIS — Z820 Family history of epilepsy and other diseases of the nervous system: Secondary | ICD-10-CM

## 2016-01-09 DIAGNOSIS — R4182 Altered mental status, unspecified: Secondary | ICD-10-CM | POA: Diagnosis present

## 2016-01-09 DIAGNOSIS — I10 Essential (primary) hypertension: Secondary | ICD-10-CM | POA: Diagnosis present

## 2016-01-09 DIAGNOSIS — Y92122 Bedroom in nursing home as the place of occurrence of the external cause: Secondary | ICD-10-CM

## 2016-01-09 DIAGNOSIS — R739 Hyperglycemia, unspecified: Secondary | ICD-10-CM

## 2016-01-09 DIAGNOSIS — Z7982 Long term (current) use of aspirin: Secondary | ICD-10-CM

## 2016-01-09 DIAGNOSIS — E1122 Type 2 diabetes mellitus with diabetic chronic kidney disease: Secondary | ICD-10-CM | POA: Diagnosis present

## 2016-01-09 DIAGNOSIS — E785 Hyperlipidemia, unspecified: Secondary | ICD-10-CM | POA: Diagnosis present

## 2016-01-09 DIAGNOSIS — S8251XA Displaced fracture of medial malleolus of right tibia, initial encounter for closed fracture: Secondary | ICD-10-CM | POA: Diagnosis present

## 2016-01-09 DIAGNOSIS — G9340 Encephalopathy, unspecified: Secondary | ICD-10-CM | POA: Diagnosis present

## 2016-01-09 DIAGNOSIS — I129 Hypertensive chronic kidney disease with stage 1 through stage 4 chronic kidney disease, or unspecified chronic kidney disease: Secondary | ICD-10-CM | POA: Diagnosis present

## 2016-01-09 DIAGNOSIS — E1110 Type 2 diabetes mellitus with ketoacidosis without coma: Secondary | ICD-10-CM | POA: Diagnosis present

## 2016-01-09 DIAGNOSIS — S82891A Other fracture of right lower leg, initial encounter for closed fracture: Secondary | ICD-10-CM

## 2016-01-09 DIAGNOSIS — I2510 Atherosclerotic heart disease of native coronary artery without angina pectoris: Secondary | ICD-10-CM | POA: Diagnosis present

## 2016-01-09 LAB — CBC WITH DIFFERENTIAL/PLATELET
Basophils Absolute: 0 10*3/uL (ref 0.0–0.1)
Basophils Relative: 0 %
EOS PCT: 1 %
Eosinophils Absolute: 0.1 10*3/uL (ref 0.0–0.7)
HEMATOCRIT: 36.1 % — AB (ref 39.0–52.0)
HEMOGLOBIN: 12.8 g/dL — AB (ref 13.0–17.0)
LYMPHS ABS: 1.7 10*3/uL (ref 0.7–4.0)
LYMPHS PCT: 37 %
MCH: 31.1 pg (ref 26.0–34.0)
MCHC: 35.5 g/dL (ref 30.0–36.0)
MCV: 87.6 fL (ref 78.0–100.0)
MONOS PCT: 9 %
Monocytes Absolute: 0.4 10*3/uL (ref 0.1–1.0)
Neutro Abs: 2.5 10*3/uL (ref 1.7–7.7)
Neutrophils Relative %: 53 %
Platelets: 103 10*3/uL — ABNORMAL LOW (ref 150–400)
RBC: 4.12 MIL/uL — ABNORMAL LOW (ref 4.22–5.81)
RDW: 12 % (ref 11.5–15.5)
WBC: 4.7 10*3/uL (ref 4.0–10.5)

## 2016-01-09 LAB — PROTIME-INR
INR: 1.1 (ref 0.00–1.49)
Prothrombin Time: 14.3 seconds (ref 11.6–15.2)

## 2016-01-09 LAB — CBG MONITORING, ED
GLUCOSE-CAPILLARY: 253 mg/dL — AB (ref 65–99)
Glucose-Capillary: 332 mg/dL — ABNORMAL HIGH (ref 65–99)

## 2016-01-09 LAB — URINALYSIS, ROUTINE W REFLEX MICROSCOPIC
BILIRUBIN URINE: NEGATIVE
HGB URINE DIPSTICK: NEGATIVE
KETONES UR: NEGATIVE mg/dL
Leukocytes, UA: NEGATIVE
NITRITE: NEGATIVE
PH: 5 (ref 5.0–8.0)
Protein, ur: NEGATIVE mg/dL
Specific Gravity, Urine: 1.015 (ref 1.005–1.030)

## 2016-01-09 LAB — GLUCOSE, CAPILLARY
GLUCOSE-CAPILLARY: 200 mg/dL — AB (ref 65–99)
GLUCOSE-CAPILLARY: 148 mg/dL — AB (ref 65–99)
Glucose-Capillary: 174 mg/dL — ABNORMAL HIGH (ref 65–99)
Glucose-Capillary: 129 mg/dL — ABNORMAL HIGH (ref 65–99)

## 2016-01-09 LAB — COMPREHENSIVE METABOLIC PANEL
ALBUMIN: 3.7 g/dL (ref 3.5–5.0)
ALT: 12 U/L — ABNORMAL LOW (ref 17–63)
AST: 16 U/L (ref 15–41)
Alkaline Phosphatase: 82 U/L (ref 38–126)
Anion gap: 16 — ABNORMAL HIGH (ref 5–15)
BUN: 30 mg/dL — ABNORMAL HIGH (ref 6–20)
CHLORIDE: 101 mmol/L (ref 101–111)
CO2: 21 mmol/L — AB (ref 22–32)
Calcium: 8.9 mg/dL (ref 8.9–10.3)
Creatinine, Ser: 2.02 mg/dL — ABNORMAL HIGH (ref 0.61–1.24)
GFR calc Af Amer: 40 mL/min — ABNORMAL LOW (ref >60)
GFR, EST NON AFRICAN AMERICAN: 34 mL/min — AB (ref >60)
Glucose, Bld: 482 mg/dL — ABNORMAL HIGH (ref 65–99)
POTASSIUM: 4.4 mmol/L (ref 3.5–5.1)
SODIUM: 138 mmol/L (ref 135–145)
Total Bilirubin: 0.5 mg/dL (ref 0.3–1.2)
Total Protein: 6.7 g/dL (ref 6.5–8.1)

## 2016-01-09 LAB — BASIC METABOLIC PANEL
ANION GAP: 9 (ref 5–15)
ANION GAP: 9 (ref 5–15)
ANION GAP: 10 (ref 5–15)
BUN: 29 mg/dL — AB (ref 6–20)
BUN: 28 mg/dL — AB (ref 6–20)
BUN: 26 mg/dL — ABNORMAL HIGH (ref 6–20)
CHLORIDE: 108 mmol/L (ref 101–111)
CO2: 25 mmol/L (ref 22–32)
CO2: 25 mmol/L (ref 22–32)
CO2: 26 mmol/L (ref 22–32)
Calcium: 8.8 mg/dL — ABNORMAL LOW (ref 8.9–10.3)
Calcium: 8.6 mg/dL — ABNORMAL LOW (ref 8.9–10.3)
Calcium: 8.7 mg/dL — ABNORMAL LOW (ref 8.9–10.3)
Chloride: 108 mmol/L (ref 101–111)
Chloride: 108 mmol/L (ref 101–111)
Creatinine, Ser: 1.66 mg/dL — ABNORMAL HIGH (ref 0.61–1.24)
Creatinine, Ser: 1.68 mg/dL — ABNORMAL HIGH (ref 0.61–1.24)
Creatinine, Ser: 1.63 mg/dL — ABNORMAL HIGH (ref 0.61–1.24)
GFR calc Af Amer: 51 mL/min — ABNORMAL LOW (ref >60)
GFR calc Af Amer: 50 mL/min — ABNORMAL LOW (ref >60)
GFR calc Af Amer: 52 mL/min — ABNORMAL LOW (ref >60)
GFR calc non Af Amer: 44 mL/min — ABNORMAL LOW (ref >60)
GFR calc non Af Amer: 43 mL/min — ABNORMAL LOW (ref >60)
GFR calc non Af Amer: 45 mL/min — ABNORMAL LOW (ref >60)
GLUCOSE: 209 mg/dL — AB (ref 65–99)
GLUCOSE: 211 mg/dL — AB (ref 65–99)
Glucose, Bld: 158 mg/dL — ABNORMAL HIGH (ref 65–99)
POTASSIUM: 4.2 mmol/L (ref 3.5–5.1)
POTASSIUM: 4.2 mmol/L (ref 3.5–5.1)
Potassium: 4.1 mmol/L (ref 3.5–5.1)
Sodium: 142 mmol/L (ref 135–145)
Sodium: 142 mmol/L (ref 135–145)
Sodium: 144 mmol/L (ref 135–145)

## 2016-01-09 LAB — APTT: APTT: 26 s (ref 24–37)

## 2016-01-09 LAB — URINE MICROSCOPIC-ADD ON
Bacteria, UA: NONE SEEN
Squamous Epithelial / LPF: NONE SEEN
WBC, UA: NONE SEEN WBC/hpf (ref 0–5)

## 2016-01-09 LAB — AMMONIA: Ammonia: 30 umol/L (ref 9–35)

## 2016-01-09 MED ORDER — LORAZEPAM 2 MG/ML IJ SOLN
1.0000 mg | Freq: Once | INTRAMUSCULAR | Status: AC
  Administered 2016-01-09: 1 mg via INTRAVENOUS
  Filled 2016-01-09: qty 1

## 2016-01-09 MED ORDER — ZIPRASIDONE MESYLATE 20 MG IM SOLR
10.0000 mg | Freq: Once | INTRAMUSCULAR | Status: AC
  Administered 2016-01-09: 10 mg via INTRAMUSCULAR
  Filled 2016-01-09: qty 20

## 2016-01-09 MED ORDER — SODIUM CHLORIDE 0.45 % IV SOLN: INTRAVENOUS | Status: AC

## 2016-01-09 MED ORDER — INSULIN ASPART 100 UNIT/ML ~~LOC~~ SOLN
0.0000 [IU] | Freq: Three times a day (TID) | SUBCUTANEOUS | Status: DC
  Administered 2016-01-09: 2 [IU] via SUBCUTANEOUS
  Administered 2016-01-09: 3 [IU] via SUBCUTANEOUS
  Administered 2016-01-10 (×2): 2 [IU] via SUBCUTANEOUS
  Administered 2016-01-10: 8 [IU] via SUBCUTANEOUS
  Administered 2016-01-11 (×2): 3 [IU] via SUBCUTANEOUS
  Administered 2016-01-11: 5 [IU] via SUBCUTANEOUS
  Administered 2016-01-12 (×2): 3 [IU] via SUBCUTANEOUS

## 2016-01-09 MED ORDER — INSULIN ASPART 100 UNIT/ML ~~LOC~~ SOLN
8.0000 [IU] | Freq: Once | SUBCUTANEOUS | Status: AC
  Administered 2016-01-09: 8 [IU] via SUBCUTANEOUS
  Filled 2016-01-09: qty 1

## 2016-01-09 MED ORDER — LORAZEPAM 2 MG/ML IJ SOLN
INTRAMUSCULAR | Status: AC
Start: 2016-01-09 — End: 2016-01-09
  Filled 2016-01-09: qty 1

## 2016-01-09 MED ORDER — CLONIDINE HCL 0.2 MG PO TABS: 0.3000 mg | ORAL_TABLET | Freq: Every day | ORAL | Status: DC

## 2016-01-09 MED ORDER — LABETALOL HCL 200 MG PO TABS: 200.0000 mg | ORAL_TABLET | Freq: Two times a day (BID) | ORAL | Status: DC

## 2016-01-09 MED ORDER — INSULIN ASPART 100 UNIT/ML ~~LOC~~ SOLN: 0.0000 [IU] | Freq: Every day | SUBCUTANEOUS | Status: DC

## 2016-01-09 MED ORDER — DEXTROSE-NACL 5-0.45 % IV SOLN: INTRAVENOUS | Status: DC

## 2016-01-09 MED ORDER — STERILE WATER FOR INJECTION IJ SOLN
INTRAMUSCULAR | Status: AC
  Administered 2016-01-09: 1.2 mL
  Filled 2016-01-09: qty 10

## 2016-01-09 MED ORDER — LABETALOL HCL 5 MG/ML IV SOLN
20.0000 mg | Freq: Four times a day (QID) | INTRAVENOUS | Status: DC
  Administered 2016-01-09 – 2016-01-10 (×6): 20 mg via INTRAVENOUS
  Filled 2016-01-09 (×6): qty 4

## 2016-01-09 MED ORDER — SODIUM CHLORIDE 0.9 % IV SOLN
INTRAVENOUS | Status: DC
  Administered 2016-01-09 – 2016-01-11 (×4): via INTRAVENOUS
  Administered 2016-01-12: 1000 mL via INTRAVENOUS

## 2016-01-09 MED ORDER — ZIPRASIDONE MESYLATE 20 MG IM SOLR
10.0000 mg | Freq: Once | INTRAMUSCULAR | Status: AC | PRN
  Administered 2016-01-09: 10 mg via INTRAMUSCULAR
  Filled 2016-01-09: qty 20

## 2016-01-09 MED ORDER — CLONIDINE HCL 0.3 MG/24HR TD PTWK
0.3000 mg | MEDICATED_PATCH | TRANSDERMAL | Status: DC
Start: 2016-01-09 — End: 2016-01-12
  Administered 2016-01-09: 0.3 mg via TRANSDERMAL
  Filled 2016-01-09: qty 1

## 2016-01-09 MED ORDER — STERILE WATER FOR INJECTION IJ SOLN
INTRAMUSCULAR | Status: AC
  Administered 2016-01-09: 0.6 mL
  Filled 2016-01-09: qty 10

## 2016-01-09 MED ORDER — ZIPRASIDONE MESYLATE 20 MG IM SOLR
20.0000 mg | INTRAMUSCULAR | Status: DC | PRN
  Administered 2016-01-09 – 2016-01-11 (×7): 20 mg via INTRAMUSCULAR
  Filled 2016-01-09 (×9): qty 20

## 2016-01-09 MED ORDER — LORAZEPAM 2 MG/ML IJ SOLN
1.0000 mg | Freq: Once | INTRAMUSCULAR | Status: AC
  Administered 2016-01-09: 1 mg via INTRAVENOUS

## 2016-01-09 MED ORDER — ENOXAPARIN SODIUM 40 MG/0.4ML ~~LOC~~ SOLN
40.0000 mg | SUBCUTANEOUS | Status: DC
  Administered 2016-01-09 – 2016-01-11 (×3): 40 mg via SUBCUTANEOUS
  Filled 2016-01-09 (×3): qty 0.4

## 2016-01-09 MED ORDER — DIPHENHYDRAMINE HCL 50 MG/ML IJ SOLN
25.0000 mg | Freq: Once | INTRAMUSCULAR | Status: AC
  Administered 2016-01-09: 25 mg via INTRAVENOUS
  Filled 2016-01-09: qty 1

## 2016-01-09 MED ORDER — SODIUM CHLORIDE 0.9 % IV SOLN
INTRAVENOUS | Status: DC
  Filled 2016-01-09: qty 2.5

## 2016-01-09 MED ORDER — SODIUM CHLORIDE 0.9 % IV BOLUS (SEPSIS)
1000.0000 mL | Freq: Once | INTRAVENOUS | Status: AC
Start: 2016-01-09 — End: 2016-01-09
  Administered 2016-01-09: 1000 mL via INTRAVENOUS

## 2016-01-09 NOTE — ED Provider Notes (Signed)
CSN: [id number]     Arrival date & time 01/09/16  0110 History   First MD Initiated Contact with Patient 01/09/16 0121     Chief Complaint  Patient presents with  . Fall   Level V caveat: Somnolence HPI Patient has a history of multiple medical problems including altered mental status with psychosis. He also has history of encephalopathy and stroke. Patient resides in a nursing facility. He has history of having recurrent issues with aggression. This evening the patient had a fall at the nursing facility. EMS was called to bring him to the emergency room for evaluation. At the nursing facility the patient was agitated. EMS had to give him Ativan IM to help calm him in order to transport him to the emergency room. Patient's speech is difficult to understand. He appears somnolent now although will respond to questions and follow commands but does not give me any history. Past Medical History  Diagnosis Date  . Aortic dissection ([place])   . Hypertension   . CVA (cerebral infarction)   . Aortic dissection ([place])   . Hypertension   . CVA (cerebral infarction)   . Diabetes mellitus   . Cardiomyopathy   . Hyperglycemia   . Bipolar 1 disorder ([place])   . Hyperkalemia   . Coronary artery disease   . Hyponatremia   . Altered mental status     with psychosis  . Dehydration   . TIA (transient ischemic attack)   . Lack of coordination   . Cardiomyopathy ([place])   . Poor historian   . Encephalopathy   . Hyperlipemia   . Psychosis   . Anemia   . Renal disorder   . Thrombocytopenia [facility])    Past Surgical History  Procedure Laterality Date  . Ascending aortic aneurysm repair    . Colonoscopy with propofol N/A 12/21/2015    Procedure: COLONOSCOPY WITH PROPOFOL;  Surgeon: Danie Binder, MD;  [facility];  Service: Endoscopy;  Laterality: N/A;  1000 - moved to 10:15 - office to notify  . Esophagogastroduodenoscopy (egd) with propofol N/A 12/21/2015    Procedure: ESOPHAGOGASTRODUODENOSCOPY  (EGD) WITH PROPOFOL;  Surgeon: Danie Binder, MD;  [facility];  Service: Endoscopy;  Laterality: N/A;  . Savory dilation N/A 12/21/2015    Procedure: SAVORY DILATION;  Surgeon: Danie Binder, MD;  Location[facility];  Service: Endoscopy;  Laterality: N/A;  . Polypectomy  12/21/2015    Procedure: POLYPECTOMY;  Surgeon: Danie Binder, MD;  [facility];  Service: Endoscopy;;  at cecum and ascending colon; rectum  . Biopsy  12/21/2015    Procedure: BIOPSY;  Surgeon: Danie Binder, MD;  [facility];  Service: Endoscopy;;  gastric biopsy   Family History  Problem Relation Age of Onset  . Alzheimer's disease Mother   . Stroke Father   . Heart disease Father   . Bipolar disorder Father   . Bipolar disorder Sister   . Bipolar disorder Sister   . Colon cancer Neg Hx    Social History  Substance Use Topics  . Smoking status: Never Smoker   . Smokeless tobacco: Never Used  . Alcohol Use: No    Review of Systems  Unable to perform ROS: Mental status change      Allergies  Review of patient's allergies indicates no known allergies.  Home Medications   Prior to Admission medications   Medication Sig Start Date End Date Taking? Authorizing Provider  aspirin 325 MG tablet Take 325 mg by mouth daily.      Historical Provider, MD  atorvastatin (LIPITOR) 10 MG tablet Take 10 mg by mouth daily.    Historical Provider, MD  baclofen (LIORESAL) 10 MG tablet Take 10 mg by mouth 3 (three) times daily.    Historical Provider, MD  BIOTIN PO Take 30 mg by mouth daily.     Historical Provider, MD  cloNIDine (CATAPRES) 0.3 MG tablet Take 0.3 mg by mouth daily.      Historical Provider, MD  divalproex (DEPAKOTE SPRINKLE) 125 MG capsule Take 250 mg by mouth 3 (three) times daily.    Historical Provider, MD  fluticasone (FLONASE) 50 MCG/ACT nasal spray Place 2 sprays into the nose daily. For allergies    Historical Provider, MD  HYDROcodone-acetaminophen (NORCO)  7.5-325 MG tablet Take 1 tablet by mouth every 4 (four) hours as needed for moderate pain.    Historical Provider, MD  labetalol (NORMODYNE) 200 MG tablet Take 200 mg by mouth 2 (two) times daily.      Historical Provider, MD  LORazepam (ATIVAN) 2 MG/ML concentrated solution Take 2 mg by mouth every 6 (six) hours as needed for anxiety (aggressive/behavior).    Historical Provider, MD  omeprazole (PRILOSEC) 20 MG capsule 1 PO WITH BREAKFAST. 12/31/15   Danie Binder, MD  QUEtiapine (SEROQUEL) 200 MG tablet Take 200 mg by mouth 2 (two) times daily.    Historical Provider, MD  risperiDONE (RISPERDAL) 1 MG tablet Take 1 tablet (1 mg total) by mouth 3 (three) times daily. Patient taking differently: Take 1 mg by mouth 2 (two) times daily.  06/05/13   Cloria Spring, MD  trihexyphenidyl (ARTANE) 5 MG tablet Take 5 mg by mouth 2 (two) times daily with a meal.    Historical Provider, MD   BP 153/68 mmHg  Pulse 67  Temp(Src) 98.4 F (36.9 C) (Oral)  Resp 14  Ht 5\' 8"  (1.727 m)  Wt 82.555 kg  BMI 27.68 kg/m2  SpO2 98% Physical Exam  Constitutional: He appears listless. No distress.  HENT:  Head: Normocephalic and atraumatic.  Right Ear: External ear normal.  Left Ear: External ear normal.  Mouth/Throat: No oropharyngeal exudate.  Eyes: Conjunctivae are normal. Pupils are equal, round, and reactive to light. Right eye exhibits no discharge. Left eye exhibits no discharge. No scleral icterus.  Neck: Neck supple. No tracheal deviation present.  Patient has a cervical spine collar in place  Cardiovascular: Normal rate, regular rhythm and intact distal pulses.   Pulmonary/Chest: Effort normal and breath sounds normal. No stridor. No respiratory distress. He has no wheezes. He has no rales.  Abdominal: Soft. Bowel sounds are normal. He exhibits no distension. There is no tenderness. There is no rebound and no guarding.  Musculoskeletal: He exhibits no edema or tenderness.       Right ankle: He  exhibits swelling. He exhibits no deformity.  Neurological: He appears listless. No cranial nerve deficit (no facial droop, extraocular movements intact,  ) or sensory deficit. He exhibits normal muscle tone. He displays no seizure activity. Coordination normal. GCS eye subscore is 4. GCS verbal subscore is 3. GCS motor subscore is 6.  Speech is garbled. No facial droop, patient will move all extremities, basically simple commands, confused  Skin: Skin is warm and dry. No rash noted. He is not diaphoretic.  Psychiatric: He has a normal mood and affect.  Nursing note and vitals reviewed.   ED Course  Procedures (including critical care time)  Medications  LORazepam (ATIVAN) 2 MG/ML injection (not administered)  ziprasidone (GEODON) injection 10 mg (10 mg Intramuscular Given 01/09/16 0233)  sterile water (preservative free) injection (0.6 mLs  Given 01/09/16 0233)  ziprasidone (GEODON) injection 10 mg (10 mg Intramuscular Given 01/09/16 0326)  sterile water (preservative free) injection (1.2 mLs  Given 01/09/16 0327)  sodium chloride 0.9 % bolus 1,000 mL (1,000 mLs Intravenous New Bag/Given 01/09/16 0413)  insulin aspart (novoLOG) injection 8 Units (8 Units Subcutaneous Given 01/09/16 0413)  LORazepam (ATIVAN) injection 1 mg (1 mg Intravenous Given 01/09/16 0413)  diphenhydrAMINE (BENADRYL) injection 25 mg (25 mg Intravenous Given 01/09/16 0413)  LORazepam (ATIVAN) injection 1 mg (1 mg Intravenous Given 01/09/16 0515)   CRITICAL CARE Performed by: [id number] Total critical care time: 35 minutes Critical care time was exclusive of separately billable procedures and treating other patients. Critical care was necessary to treat or prevent imminent or life-threatening deterioration. Critical care was time spent personally by me on the following activities: development of treatment plan with patient and/or surrogate as well as nursing, discussions with consultants, evaluation of patient's response to treatment,  examination of patient, obtaining history from patient or surrogate, ordering and performing treatments and interventions, ordering and review of laboratory studies, ordering and review of radiographic studies, pulse oximetry and re-evaluation of patient's condition.  CT scans ordered.  Pt is resting in his room but when he is brought over to the xray suite he becomes agitated and wont lie still.  10 mg geodon given initially.  Will give an additional dose.  0403  Pt continues to be restless.  Would not lie still for the CT despite 2 doses of geodon.  Plain films show ankle fx.  Labs show worsening hyperglycemia and renal insufficiency.  Will give fluid bolus.  Insulin.  Splint ankle.  Ativan IV to see if we can get him sedated enough to scan.  Labs Review Labs Reviewed  COMPREHENSIVE METABOLIC PANEL - Abnormal; Notable for the following:    CO2 21 (*)    Glucose, Bld 482 (*)    BUN 30 (*)    Creatinine, Ser 2.02 (*)    ALT 12 (*)    GFR calc non Af Amer 34 (*)    GFR calc Af Amer 40 (*)    Anion gap 16 (*)    All other components within normal limits  CBC WITH DIFFERENTIAL/PLATELET - Abnormal; Notable for the following:    RBC 4.12 (*)    Hemoglobin 12.8 (*)    HCT 36.1 (*)    Platelets 103 (*)    All other components within normal limits  CBG MONITORING, ED - Abnormal; Notable for the following:    Glucose-Capillary 332 (*)    All other components within normal limits  APTT  PROTIME-INR  AMMONIA    Imaging Review Dg Chest 1 View  01/09/2016  CLINICAL DATA:  [place] out of bed. EXAM: CHEST 1 VIEW COMPARISON:  12/18/2015 FINDINGS: There is mild cardiomegaly and aortic tortuosity, unchanged. The lungs are clear. There is no large effusion. The pulmonary vasculature is normal. IMPRESSION: Unchanged cardiomegaly.  No acute cardiopulmonary findings. Electronically Signed   By: Andreas Newport M.D.   On: 01/09/2016 03:45   Dg Ankle Complete Right  01/09/2016  CLINICAL DATA:  [place] out of  bed EXAM: RIGHT ANKLE - COMPLETE 3+ VIEW COMPARISON:  None. FINDINGS: There is a nondisplaced transverse fracture across the medial malleolus. There is a  nondisplaced oblique fracture of the distal fibula. The mortise is symmetric. IMPRESSION: Fractures of the distal fibula and medial malleolus. Electronically Signed   By: Andreas Newport M.D.   On: 01/09/2016 03:46   Ct Head Wo Contrast  01/09/2016  CLINICAL DATA:  Patient fell from bed. Altered mental status. Unable to cooperate and combative. EXAM: CT HEAD WITHOUT CONTRAST CT CERVICAL SPINE WITHOUT CONTRAST TECHNIQUE: Multidetector CT imaging of the head and cervical spine was performed following the standard protocol without intravenous contrast. Multiplanar CT image reconstructions of the cervical spine were also generated. COMPARISON:  CT head 12/18/2015 FINDINGS: CT HEAD FINDINGS Diffuse cerebral atrophy. Ventricular dilatation consistent with central atrophy. Low-attenuation changes in the deep white matter consistent with small vessel ischemia. Old lacunar infarct in the left basal ganglia. Old area of encephalomalacia involving the right occipital, parietal, and temporal regions with associated volume loss causing asymmetric dilatation of the right lateral ventricle. This is consistent with old infarct. No change since previous study. No mass effect or midline shift. No abnormal extra-axial fluid collections. Gray-white matter junctions are distinct. Basal cisterns are not effaced. No evidence of acute intracranial hemorrhage. No depressed skull fractures. Mucosal thickening in the paranasal sinuses. Mastoid air cells are not opacified. Vascular calcifications. CT CERVICAL SPINE FINDINGS Normal alignment of the cervical spine. No vertebral compression deformities. Mild endplate hypertrophic changes. Intervertebral disc space heights are mostly preserved. No prevertebral soft tissue swelling. No focal bone lesion or bone destruction. Bone cortex and  trabecular architecture appears intact. C1-2 articulation appears intact. The head is tilted towards the left. This could be due to positioning or may indicate muscle spasm. Soft tissues are unremarkable. Vascular calcifications. No acute intracranial abnormalities. Chronic atrophy and small vessel ischemic changes. Old infarct in the right temporal parietal occipital region. Normal alignment of the cervical spine. Mild degenerative changes. No acute displaced fractures identified. Electronically Signed   By: Lucienne Capers M.D.   On: 01/09/2016 05:06   Ct Cervical Spine Wo Contrast  01/09/2016  CLINICAL DATA:  Patient fell from bed. Altered mental status. Unable to cooperate and combative. EXAM: CT HEAD WITHOUT CONTRAST CT CERVICAL SPINE WITHOUT CONTRAST TECHNIQUE: Multidetector CT imaging of the head and cervical spine was performed following the standard protocol without intravenous contrast. Multiplanar CT image reconstructions of the cervical spine were also generated. COMPARISON:  CT head 12/18/2015 FINDINGS: CT HEAD FINDINGS Diffuse cerebral atrophy. Ventricular dilatation consistent with central atrophy. Low-attenuation changes in the deep white matter consistent with small vessel ischemia. Old lacunar infarct in the left basal ganglia. Old area of encephalomalacia involving the right occipital, parietal, and temporal regions with associated volume loss causing asymmetric dilatation of the right lateral ventricle. This is consistent with old infarct. No change since previous study. No mass effect or midline shift. No abnormal extra-axial fluid collections. Gray-white matter junctions are distinct. Basal cisterns are not effaced. No evidence of acute intracranial hemorrhage. No depressed skull fractures. Mucosal thickening in the paranasal sinuses. Mastoid air cells are not opacified. Vascular calcifications. CT CERVICAL SPINE FINDINGS Normal alignment of the cervical spine. No vertebral compression  deformities. Mild endplate hypertrophic changes. Intervertebral disc space heights are mostly preserved. No prevertebral soft tissue swelling. No focal bone lesion or bone destruction. Bone cortex and trabecular architecture appears intact. C1-2 articulation appears intact. The head is tilted towards the left. This could be due to positioning or may indicate muscle spasm. Soft tissues are unremarkable. Vascular calcifications. No acute intracranial abnormalities. Chronic atrophy and  small vessel ischemic changes. Old infarct in the right temporal parietal occipital region. Normal alignment of the cervical spine. Mild degenerative changes. No acute displaced fractures identified. Electronically Signed   By: Lucienne Capers M.D.   On: 01/09/2016 05:06   I have personally reviewed and evaluated these images and lab results as part of my medical decision-making.   EKG Interpretation   Date/Time:  Sunday Jan 09 2016 01:33:05 EDT Ventricular Rate:  69 PR Interval:  197 QRS Duration: 116 QT Interval:  458 QTC Calculation: 491 R Axis:   -14 Text Interpretation:  Sinus rhythm Atrial premature complexes Nonspecific  intraventricular conduction delay Nonspecific T abnormalities, lateral  leads No significant change since last tracing Confirmed by Debbie Bellucci  MD-J,  Francisca Harbuck [id number]) on 01/09/2016 2:21:39 AM      MDM   Final diagnoses:  Fx ankle, right, closed, initial encounter  Hyperglycemia  Renal insufficiency  Encephalopathy    CT scan does not show any acute injuries.  Labs show worsening renal insufficiency and hyperglycemia.  Pt remains confused in the ED.  Requiring constant re direction as he is constantly moving in the bed trying to get up.  History of confusion but seems to be increased.  Previous records indicate patient was communicative, able to provide a history.  Will consult with medical service for admission, consider neuro consult, MRI.  Ankle splinted in the ED.   Routine ortho follow  up    Dorie Rank, MD 01/09/16 [id number]

## 2016-01-09 NOTE — ED Notes (Signed)
Patient transported to CT 

## 2016-01-09 NOTE — ED Notes (Signed)
Patient to CT, unable to get him to lay still on the table. Returned to the ER with patient. MD aware.

## 2016-01-09 NOTE — Progress Notes (Signed)
Pt not compliant with telemetry apparatus.  Has removed tele multiple times despite being instructed of its importance.  Tele will remain off.

## 2016-01-09 NOTE — ED Notes (Signed)
Report to Tandra, RN 

## 2016-01-09 NOTE — H&P (Signed)
TRH H&P   Patient Demographics:    Derrick Butler, is a 60 y.o. male  MRN: [id number]   DOB - 1955-10-14  Admit Date - 01/09/2016  Outpatient Primary MD for the patient is Rosita Fire, MD  Referring MD/NP/PA:   Patient coming from: [place] home  Chief Complaint  Patient presents with  . Fall      HPI:    Derrick Butler  is a 60 y.o. male, From [place] home, with a history of CVA, aortic dissection, diabetes mellitus, bipolar disorder, CAD, encephalopathy who was brought to the ED from skilled facility for altered mental status and fall. Patient has been having recurrent issues with aggression. At the nursing facility patient was found to be agitated EMS was called at that time Ativan IM had given him to transport to the ER. Patient at this time is somnolent, does not follow commands. In the ED patient had to be given Ativan, Geodon for agitation. He underwent imaging studies which showed fractures of distal fibula and medial malleolus. Also lab work revealed mild DKA with AG 16.    Review of systems:    Unable to obtain due to patient's altered mental status   With Past History of the following :    Past Medical History  Diagnosis Date  . Aortic dissection ([place])   . Hypertension   . CVA (cerebral infarction)   . Aortic dissection ([place])   . Hypertension   . CVA (cerebral infarction)   . Diabetes mellitus   . Cardiomyopathy   . Hyperglycemia   . Bipolar 1 disorder ([place])   . Hyperkalemia   . Coronary artery disease   . Hyponatremia   . Altered mental status     with psychosis  . Dehydration   . TIA (transient ischemic attack)   . Lack of coordination   . Cardiomyopathy ([place])   . Poor historian   . Encephalopathy   . Hyperlipemia   . Psychosis   . Anemia   . Renal disorder   . Thrombocytopenia [facility])       Past Surgical History  Procedure Laterality Date  .  Ascending aortic aneurysm repair    . Colonoscopy with propofol N/A 12/21/2015    Procedure: COLONOSCOPY WITH PROPOFOL;  Surgeon: Danie Binder, MD;  [facility];  Service: Endoscopy;  Laterality: N/A;  1000 - moved to 10:15 - office to notify  . Esophagogastroduodenoscopy (egd) with propofol N/A 12/21/2015    Procedure: ESOPHAGOGASTRODUODENOSCOPY (EGD) WITH PROPOFOL;  Surgeon: Danie Binder, MD;  [facility];  Service: Endoscopy;  Laterality: N/A;  . Savory dilation N/A 12/21/2015    Procedure: SAVORY DILATION;  Surgeon: Danie Binder, MD;  [facility];  Service: Endoscopy;  Laterality: N/A;  . Polypectomy  12/21/2015    Procedure: POLYPECTOMY;  Surgeon: Danie Binder, MD;  [facility];  Service: Endoscopy;;  at cecum and ascending colon; rectum  . Biopsy  12/21/2015  Procedure: BIOPSY;  Surgeon: Danie Binder, MD;  [facility];  Service: Endoscopy;;  gastric biopsy      Social History:     Social History  Substance Use Topics  . Smoking status: Never Smoker   . Smokeless tobacco: Never Used  . Alcohol Use: No     Lives - [place] - usually mobile, able to go to bathroom office on and follows commands as per nursing home staff     Family History :     Family History  Problem Relation Age of Onset  . Alzheimer's disease Mother   . Stroke Father   . Heart disease Father   . Bipolar disorder Father   . Bipolar disorder Sister   . Bipolar disorder Sister   . Colon cancer Neg Hx       Home Medications:   Prior to Admission medications   Medication Sig Start Date End Date Taking? Authorizing Provider  metFORMIN (GLUCOPHAGE) 500 MG tablet Take 500 mg by mouth daily. 1 po daily until 01/12/2010 then 1 PO BID.   Yes Historical Provider, MD  aspirin 325 MG tablet Take 325 mg by mouth daily.      Historical Provider, MD  atorvastatin (LIPITOR) 10 MG tablet Take 10 mg by mouth daily.     Historical Provider, MD  baclofen (LIORESAL) 10 MG tablet Take 10 mg by mouth 3 (three) times daily.    Historical Provider, MD  BIOTIN PO Take 30 mg by mouth daily.     Historical Provider, MD  cloNIDine (CATAPRES) 0.3 MG tablet Take 0.3 mg by mouth daily.      Historical Provider, MD  divalproex (DEPAKOTE SPRINKLE) 125 MG capsule Take 250 mg by mouth 3 (three) times daily.    Historical Provider, MD  fluticasone (FLONASE) 50 MCG/ACT nasal spray Place 2 sprays into the nose daily. For allergies    Historical Provider, MD  HYDROcodone-acetaminophen (NORCO) 7.5-325 MG tablet Take 1 tablet by mouth every 4 (four) hours as needed for moderate pain.    Historical Provider, MD  labetalol (NORMODYNE) 200 MG tablet Take 200 mg by mouth 2 (two) times daily.      Historical Provider, MD  LORazepam (ATIVAN) 2 MG/ML concentrated solution Inject 1 mg into the muscle every 6 (six) hours as needed for anxiety (aggressive/behavior).     Historical Provider, MD  omeprazole (PRILOSEC) 20 MG capsule 1 PO WITH BREAKFAST. 12/31/15   Danie Binder, MD  QUEtiapine (SEROQUEL) 200 MG tablet Take 200 mg by mouth 2 (two) times daily.    Historical Provider, MD  risperiDONE (RISPERDAL) 1 MG tablet Take 1 tablet (1 mg total) by mouth 3 (three) times daily. Patient taking differently: Take 1 mg by mouth 2 (two) times daily.  06/05/13   Cloria Spring, MD  trihexyphenidyl (ARTANE) 5 MG tablet Take 5 mg by mouth 2 (two) times daily with a meal.    Historical Provider, MD     Allergies:    No Known Allergies   Physical Exam:   Vitals  Blood pressure 155/66, pulse 63, temperature 98.4 F (36.9 C), temperature source Oral, resp. rate 0, height 5\' 8"  (1.727 m), weight 82.555 kg (182 lb), SpO2 97 %.   1. General Agitated appearing male, not following commands   2. Moving all extremities  3.. Supple Neck, No JVD, No cervical lymphadenopathy appriciated, No Carotid Bruits.  4. Symmetrical Chest wall movement, Good air  movement bilaterally,  CTAB.  5. RRR, No Gallops, Rubs or Murmurs, No Parasternal Heave.  6. Positive Bowel Sounds, Abdomen Soft, No tenderness, No organomegaly appriciated,No rebound -guarding or rigidity.  7.  No Cyanosis, Normal Skin Turgor, No Skin Rash or Bruise.  8. Good muscle tone,  joints appear normal , no effusions, Normal ROM.   *   Data Review:    CBC  Recent Labs Lab 01/09/16 0126  WBC 4.7  HGB 12.8*  HCT 36.1*  PLT 103*  MCV 87.6  MCH 31.1  MCHC 35.5  RDW 12.0  LYMPHSABS 1.7  MONOABS 0.4  EOSABS 0.1  BASOSABS 0.0   ------------------------------------------------------------------------------------------------------------------  Chemistries   Recent Labs Lab 01/09/16 0126  NA 138  K 4.4  CL 101  CO2 21*  GLUCOSE 482*  BUN 30*  CREATININE 2.02*  CALCIUM 8.9  AST 16  ALT 12*  ALKPHOS 82  BILITOT 0.5   ------------------------------------------------------------------------------------------------------------------   Coagulation profile  Recent Labs Lab 01/09/16 0126  INR 1.10   ------------------------------------------------------------------------------------------------------------------- No results for input(s): DDIMER in the last 72 hours. -------------------------------------------------------------------------------------------------------------------  Cardiac Enzymes No results for input(s): CKMB, TROPONINI, MYOGLOBIN in the last 168 hours.  Invalid input(s): CK ------------------------------------------------------------------------------------------------------------------ No results found for: BNP   ---------------------------------------------------------------------------------------------------------------  Urinalysis    Component Value Date/Time   COLORURINE YELLOW 12/19/2015 0050   APPEARANCEUR CLEAR 12/19/2015 0050   LABSPEC 1.015 12/19/2015 0050   PHURINE 6.0 12/19/2015 0050   GLUCOSEU >1000* 12/19/2015  0050   HGBUR NEGATIVE 12/19/2015 0050   BILIRUBINUR NEGATIVE 12/19/2015 0050   KETONESUR NEGATIVE 12/19/2015 0050   PROTEINUR NEGATIVE 12/19/2015 0050   UROBILINOGEN 1.0 10/07/2014 1726   NITRITE NEGATIVE 12/19/2015 0050   LEUKOCYTESUR NEGATIVE 12/19/2015 0050    ----------------------------------------------------------------------------------------------------------------   Imaging Results:    Dg Chest 1 View  01/09/2016  CLINICAL DATA:  [place] out of bed. EXAM: CHEST 1 VIEW COMPARISON:  12/18/2015 FINDINGS: There is mild cardiomegaly and aortic tortuosity, unchanged. The lungs are clear. There is no large effusion. The pulmonary vasculature is normal. IMPRESSION: Unchanged cardiomegaly.  No acute cardiopulmonary findings. Electronically Signed   By: Andreas Newport M.D.   On: 01/09/2016 03:45   Dg Ankle Complete Right  01/09/2016  CLINICAL DATA:  [place] out of bed EXAM: RIGHT ANKLE - COMPLETE 3+ VIEW COMPARISON:  None. FINDINGS: There is a nondisplaced transverse fracture across the medial malleolus. There is a nondisplaced oblique fracture of the distal fibula. The mortise is symmetric. IMPRESSION: Fractures of the distal fibula and medial malleolus. Electronically Signed   By: Andreas Newport M.D.   On: 01/09/2016 03:46   Ct Head Wo Contrast  01/09/2016  CLINICAL DATA:  Patient fell from bed. Altered mental status. Unable to cooperate and combative. EXAM: CT HEAD WITHOUT CONTRAST CT CERVICAL SPINE WITHOUT CONTRAST TECHNIQUE: Multidetector CT imaging of the head and cervical spine was performed following the standard protocol without intravenous contrast. Multiplanar CT image reconstructions of the cervical spine were also generated. COMPARISON:  CT head 12/18/2015 FINDINGS: CT HEAD FINDINGS Diffuse cerebral atrophy. Ventricular dilatation consistent with central atrophy. Low-attenuation changes in the deep white matter consistent with small vessel ischemia. Old lacunar infarct in the left  basal ganglia. Old area of encephalomalacia involving the right occipital, parietal, and temporal regions with associated volume loss causing asymmetric dilatation of the right lateral ventricle. This is consistent with old infarct. No change since previous study. No mass effect or midline shift. No abnormal extra-axial fluid collections. Gray-white matter junctions are distinct.  Basal cisterns are not effaced. No evidence of acute intracranial hemorrhage. No depressed skull fractures. Mucosal thickening in the paranasal sinuses. Mastoid air cells are not opacified. Vascular calcifications. CT CERVICAL SPINE FINDINGS Normal alignment of the cervical spine. No vertebral compression deformities. Mild endplate hypertrophic changes. Intervertebral disc space heights are mostly preserved. No prevertebral soft tissue swelling. No focal bone lesion or bone destruction. Bone cortex and trabecular architecture appears intact. C1-2 articulation appears intact. The head is tilted towards the left. This could be due to positioning or may indicate muscle spasm. Soft tissues are unremarkable. Vascular calcifications. No acute intracranial abnormalities. Chronic atrophy and small vessel ischemic changes. Old infarct in the right temporal parietal occipital region. Normal alignment of the cervical spine. Mild degenerative changes. No acute displaced fractures identified. Electronically Signed   By: Lucienne Capers M.D.   On: 01/09/2016 05:06   Ct Cervical Spine Wo Contrast  01/09/2016  CLINICAL DATA:  Patient fell from bed. Altered mental status. Unable to cooperate and combative. EXAM: CT HEAD WITHOUT CONTRAST CT CERVICAL SPINE WITHOUT CONTRAST TECHNIQUE: Multidetector CT imaging of the head and cervical spine was performed following the standard protocol without intravenous contrast. Multiplanar CT image reconstructions of the cervical spine were also generated. COMPARISON:  CT head 12/18/2015 FINDINGS: CT HEAD FINDINGS  Diffuse cerebral atrophy. Ventricular dilatation consistent with central atrophy. Low-attenuation changes in the deep white matter consistent with small vessel ischemia. Old lacunar infarct in the left basal ganglia. Old area of encephalomalacia involving the right occipital, parietal, and temporal regions with associated volume loss causing asymmetric dilatation of the right lateral ventricle. This is consistent with old infarct. No change since previous study. No mass effect or midline shift. No abnormal extra-axial fluid collections. Gray-white matter junctions are distinct. Basal cisterns are not effaced. No evidence of acute intracranial hemorrhage. No depressed skull fractures. Mucosal thickening in the paranasal sinuses. Mastoid air cells are not opacified. Vascular calcifications. CT CERVICAL SPINE FINDINGS Normal alignment of the cervical spine. No vertebral compression deformities. Mild endplate hypertrophic changes. Intervertebral disc space heights are mostly preserved. No prevertebral soft tissue swelling. No focal bone lesion or bone destruction. Bone cortex and trabecular architecture appears intact. C1-2 articulation appears intact. The head is tilted towards the left. This could be due to positioning or may indicate muscle spasm. Soft tissues are unremarkable. Vascular calcifications. No acute intracranial abnormalities. Chronic atrophy and small vessel ischemic changes. Old infarct in the right temporal parietal occipital region. Normal alignment of the cervical spine. Mild degenerative changes. No acute displaced fractures identified. Electronically Signed   By: Lucienne Capers M.D.   On: 01/09/2016 05:06    My personal review of EKG: Rhythm NSR, Rate     Assessment & Plan:    Active Problems:   Altered mental status   Dehydration   DKA (diabetic ketoacidoses) ([place])     1. Altered mental status-likely multifactorial From DKA, ? Infection.  We'll start the patient on DKA protocol,  admit to stepdown, will check UA, urine culture. We will follow the results.  2. Ankle swelling - patient has fracture of distal fibula and medial malleolus , which has been put under splint. Patient will need to follow-up after his outpatient  3. Diabetes mellitus - hold oral hypoglycemic agents at this time, patient on DKA protocol.   DVT Prophylaxis-   Lovenox   AM Labs Ordered, also please review Full Orders  Family Communication: No family at bedside  Code Status full code  Admission status: Inpatient  Time spent in minutes : 60 min   Kendyn Zaman S M.D on 01/09/2016 at 6:31 AM  Between 7am to 7pm - Pager - [phone]. After 7pm go to [email or web address] - password [facility]  [facility] - Office  [phone]

## 2016-01-09 NOTE — ED Notes (Signed)
Pt is a resident of [facility] who fell from his bed as seen by pt's roommate, per staff pt started having altered mental status after the fall, pt combative with ems, given 1mg  ativan im prior to arrival in er

## 2016-01-09 NOTE — Progress Notes (Signed)
Subjective: He was admitted early this morning with dehydration and acute kidney injury and what appeared to be DKA. He was to be started on insulin drip but it appears that his diabetic ketoacidosis has already resolved. At baseline he has severe mental illness and is frequently agitated and combative. He has received Geodon and his cough right now. However I'm not sure that he can take anything by mouth.  Objective: Vital signs in last 24 hours: Temp:  [97.9 F (36.6 C)-98.4 F (36.9 C)] 97.9 F (36.6 C) (05/07 0757) Pulse Rate:  [63-70] 68 (05/07 0729) Resp:  [0-24] 24 (05/07 0729) BP: (142-161)/(66-100) 147/79 mmHg (05/07 0729) SpO2:  [93 %-99 %] 99 % (05/07 0729) Weight:  [82.555 kg (182 lb)] 82.555 kg (182 lb) (05/07 0115) Weight change:  Last BM Date: 01/09/16  Intake/Output from previous day:    PHYSICAL EXAM General appearance: Sleepy Resp: rhonchi bilaterally Cardio: regular rate and rhythm, S1, S2 normal, no murmur, click, rub or gallop GI: soft, non-tender; bowel sounds normal; no masses,  no organomegaly Extremities: extremities normal, atraumatic, no cyanosis or edema  Lab Results:  Results for orders placed or performed during the hospital encounter of 01/09/16 (from the past 48 hour(s))  APTT     Status: None   Collection Time: 01/09/16  1:26 AM  Result Value Ref Range   aPTT 26 24 - 37 seconds  Comprehensive metabolic panel     Status: Abnormal   Collection Time: 01/09/16  1:26 AM  Result Value Ref Range   Sodium 138 135 - 145 mmol/L   Potassium 4.4 3.5 - 5.1 mmol/L   Chloride 101 101 - 111 mmol/L   CO2 21 (L) 22 - 32 mmol/L   Glucose, Bld 482 (H) 65 - 99 mg/dL   BUN 30 (H) 6 - 20 mg/dL   Creatinine, Ser 2.02 (H) 0.61 - 1.24 mg/dL   Calcium 8.9 8.9 - 10.3 mg/dL   Total Protein 6.7 6.5 - 8.1 g/dL   Albumin 3.7 3.5 - 5.0 g/dL   AST 16 15 - 41 U/L   ALT 12 (L) 17 - 63 U/L   Alkaline Phosphatase 82 38 - 126 U/L   Total Bilirubin 0.5 0.3 - 1.2 mg/dL    GFR calc non Af Amer 34 (L) >60 mL/min   GFR calc Af Amer 40 (L) >60 mL/min    Comment: (NOTE) The eGFR has been calculated using the CKD EPI equation. This calculation has not been validated in all clinical situations. eGFR's persistently <60 mL/min signify possible Chronic Kidney Disease.    Anion gap 16 (H) 5 - 15  CBC WITH DIFFERENTIAL     Status: Abnormal   Collection Time: 01/09/16  1:26 AM  Result Value Ref Range   WBC 4.7 4.0 - 10.5 K/uL   RBC 4.12 (L) 4.22 - 5.81 MIL/uL   Hemoglobin 12.8 (L) 13.0 - 17.0 g/dL   HCT 36.1 (L) 39.0 - 52.0 %   MCV 87.6 78.0 - 100.0 fL   MCH 31.1 26.0 - 34.0 pg   MCHC 35.5 30.0 - 36.0 g/dL   RDW 12.0 11.5 - 15.5 %   Platelets 103 (L) 150 - 400 K/uL    Comment: SPECIMEN CHECKED FOR CLOTS CONSISTENT WITH PREVIOUS RESULT    Neutrophils Relative % 53 %   Neutro Abs 2.5 1.7 - 7.7 K/uL   Lymphocytes Relative 37 %   Lymphs Abs 1.7 0.7 - 4.0 K/uL   Monocytes Relative 9 %  Monocytes Absolute 0.4 0.1 - 1.0 K/uL   Eosinophils Relative 1 %   Eosinophils Absolute 0.1 0.0 - 0.7 K/uL   Basophils Relative 0 %   Basophils Absolute 0.0 0.0 - 0.1 K/uL  Protime-INR     Status: None   Collection Time: 01/09/16  1:26 AM  Result Value Ref Range   Prothrombin Time 14.3 11.6 - 15.2 seconds   INR 1.10 0.00 - 1.49  Ammonia     Status: None   Collection Time: 01/09/16  2:06 AM  Result Value Ref Range   Ammonia 30 9 - 35 umol/L  CBG monitoring, ED     Status: Abnormal   Collection Time: 01/09/16  5:20 AM  Result Value Ref Range   Glucose-Capillary 332 (H) 65 - 99 mg/dL  Urinalysis, Routine w reflex microscopic (not at [facility])     Status: Abnormal   Collection Time: 01/09/16  6:36 AM  Result Value Ref Range   Color, Urine YELLOW YELLOW   APPearance CLEAR CLEAR   Specific Gravity, Urine 1.015 1.005 - 1.030   pH 5.0 5.0 - 8.0   Glucose, UA >1000 (A) NEGATIVE mg/dL   Hgb urine dipstick NEGATIVE NEGATIVE   Bilirubin Urine NEGATIVE NEGATIVE   Ketones, ur  NEGATIVE NEGATIVE mg/dL   Protein, ur NEGATIVE NEGATIVE mg/dL   Nitrite NEGATIVE NEGATIVE   Leukocytes, UA NEGATIVE NEGATIVE  Urine microscopic-add on     Status: None   Collection Time: 01/09/16  6:36 AM  Result Value Ref Range   Squamous Epithelial / LPF NONE SEEN NONE SEEN   WBC, UA NONE SEEN 0 - 5 WBC/hpf   RBC / HPF 0-5 0 - 5 RBC/hpf   Bacteria, UA NONE SEEN NONE SEEN  CBG monitoring, ED     Status: Abnormal   Collection Time: 01/09/16  7:16 AM  Result Value Ref Range   Glucose-Capillary 253 (H) 65 - 99 mg/dL  Basic metabolic panel     Status: Abnormal   Collection Time: 01/09/16  7:57 AM  Result Value Ref Range   Sodium 142 135 - 145 mmol/L   Potassium 4.2 3.5 - 5.1 mmol/L   Chloride 108 101 - 111 mmol/L   CO2 25 22 - 32 mmol/L   Glucose, Bld 209 (H) 65 - 99 mg/dL   BUN 29 (H) 6 - 20 mg/dL   Creatinine, Ser 1.66 (H) 0.61 - 1.24 mg/dL   Calcium 8.8 (L) 8.9 - 10.3 mg/dL   GFR calc non Af Amer 44 (L) >60 mL/min   GFR calc Af Amer 51 (L) >60 mL/min    Comment: (NOTE) The eGFR has been calculated using the CKD EPI equation. This calculation has not been validated in all clinical situations. eGFR's persistently <60 mL/min signify possible Chronic Kidney Disease.    Anion gap 9 5 - 15    ABGS No results for input(s): PHART, PO2ART, TCO2, HCO3 in the last 72 hours.  Invalid input(s): PCO2 CULTURES No results found for this or any previous visit (from the past 240 hour(s)). Studies/Results: Dg Chest 1 View  01/09/2016  CLINICAL DATA:  [place] out of bed. EXAM: CHEST 1 VIEW COMPARISON:  12/18/2015 FINDINGS: There is mild cardiomegaly and aortic tortuosity, unchanged. The lungs are clear. There is no large effusion. The pulmonary vasculature is normal. IMPRESSION: Unchanged cardiomegaly.  No acute cardiopulmonary findings. Electronically Signed   By: Andreas Newport M.D.   On: 01/09/2016 03:45   Dg Ankle Complete Right  01/09/2016  CLINICAL  DATA:  [place] out of bed EXAM: RIGHT  ANKLE - COMPLETE 3+ VIEW COMPARISON:  None. FINDINGS: There is a nondisplaced transverse fracture across the medial malleolus. There is a nondisplaced oblique fracture of the distal fibula. The mortise is symmetric. IMPRESSION: Fractures of the distal fibula and medial malleolus. Electronically Signed   By: Andreas Newport M.D.   On: 01/09/2016 03:46   Ct Head Wo Contrast  01/09/2016  CLINICAL DATA:  Patient fell from bed. Altered mental status. Unable to cooperate and combative. EXAM: CT HEAD WITHOUT CONTRAST CT CERVICAL SPINE WITHOUT CONTRAST TECHNIQUE: Multidetector CT imaging of the head and cervical spine was performed following the standard protocol without intravenous contrast. Multiplanar CT image reconstructions of the cervical spine were also generated. COMPARISON:  CT head 12/18/2015 FINDINGS: CT HEAD FINDINGS Diffuse cerebral atrophy. Ventricular dilatation consistent with central atrophy. Low-attenuation changes in the deep white matter consistent with small vessel ischemia. Old lacunar infarct in the left basal ganglia. Old area of encephalomalacia involving the right occipital, parietal, and temporal regions with associated volume loss causing asymmetric dilatation of the right lateral ventricle. This is consistent with old infarct. No change since previous study. No mass effect or midline shift. No abnormal extra-axial fluid collections. Gray-white matter junctions are distinct. Basal cisterns are not effaced. No evidence of acute intracranial hemorrhage. No depressed skull fractures. Mucosal thickening in the paranasal sinuses. Mastoid air cells are not opacified. Vascular calcifications. CT CERVICAL SPINE FINDINGS Normal alignment of the cervical spine. No vertebral compression deformities. Mild endplate hypertrophic changes. Intervertebral disc space heights are mostly preserved. No prevertebral soft tissue swelling. No focal bone lesion or bone destruction. Bone cortex and trabecular  architecture appears intact. C1-2 articulation appears intact. The head is tilted towards the left. This could be due to positioning or may indicate muscle spasm. Soft tissues are unremarkable. Vascular calcifications. No acute intracranial abnormalities. Chronic atrophy and small vessel ischemic changes. Old infarct in the right temporal parietal occipital region. Normal alignment of the cervical spine. Mild degenerative changes. No acute displaced fractures identified. Electronically Signed   By: Lucienne Capers M.D.   On: 01/09/2016 05:06   Ct Cervical Spine Wo Contrast  01/09/2016  CLINICAL DATA:  Patient fell from bed. Altered mental status. Unable to cooperate and combative. EXAM: CT HEAD WITHOUT CONTRAST CT CERVICAL SPINE WITHOUT CONTRAST TECHNIQUE: Multidetector CT imaging of the head and cervical spine was performed following the standard protocol without intravenous contrast. Multiplanar CT image reconstructions of the cervical spine were also generated. COMPARISON:  CT head 12/18/2015 FINDINGS: CT HEAD FINDINGS Diffuse cerebral atrophy. Ventricular dilatation consistent with central atrophy. Low-attenuation changes in the deep white matter consistent with small vessel ischemia. Old lacunar infarct in the left basal ganglia. Old area of encephalomalacia involving the right occipital, parietal, and temporal regions with associated volume loss causing asymmetric dilatation of the right lateral ventricle. This is consistent with old infarct. No change since previous study. No mass effect or midline shift. No abnormal extra-axial fluid collections. Gray-white matter junctions are distinct. Basal cisterns are not effaced. No evidence of acute intracranial hemorrhage. No depressed skull fractures. Mucosal thickening in the paranasal sinuses. Mastoid air cells are not opacified. Vascular calcifications. CT CERVICAL SPINE FINDINGS Normal alignment of the cervical spine. No vertebral compression deformities.  Mild endplate hypertrophic changes. Intervertebral disc space heights are mostly preserved. No prevertebral soft tissue swelling. No focal bone lesion or bone destruction. Bone cortex and trabecular architecture appears intact. C1-2 articulation appears intact.  The head is tilted towards the left. This could be due to positioning or may indicate muscle spasm. Soft tissues are unremarkable. Vascular calcifications. No acute intracranial abnormalities. Chronic atrophy and small vessel ischemic changes. Old infarct in the right temporal parietal occipital region. Normal alignment of the cervical spine. Mild degenerative changes. No acute displaced fractures identified. Electronically Signed   By: Lucienne Capers M.D.   On: 01/09/2016 05:06    Medications:  Prior to Admission:  Prescriptions prior to admission  Medication Sig Dispense Refill Last Dose  . aspirin 325 MG tablet Take 325 mg by mouth daily.     01/08/2016 at 0900  . atorvastatin (LIPITOR) 10 MG tablet Take 10 mg by mouth daily.   01/08/2016 at Unknown time  . baclofen (LIORESAL) 10 MG tablet Take 10 mg by mouth 3 (three) times daily.   01/08/2016 at Unknown time  . BIOTIN PO Take 30 mg by mouth daily.    01/08/2016 at Unknown time  . cloNIDine (CATAPRES) 0.3 MG tablet Take 0.3 mg by mouth daily.     01/08/2016 at Unknown time  . divalproex (DEPAKOTE SPRINKLE) 125 MG capsule Take 375 mg by mouth 3 (three) times daily.    01/08/2016 at Unknown time  . fluticasone (FLONASE) 50 MCG/ACT nasal spray Place 2 sprays into the nose daily. For allergies   01/08/2016 at Unknown time  . HYDROcodone-acetaminophen (NORCO/VICODIN) 5-325 MG tablet Take 1 tablet by mouth every 4 (four) hours as needed for moderate pain.   unknown  . labetalol (NORMODYNE) 200 MG tablet Take 200 mg by mouth 2 (two) times daily.     01/08/2016 at 2100  . LORazepam (ATIVAN) 2 MG/ML concentrated solution Inject 1 mg into the muscle every 6 (six) hours as needed for anxiety (aggressive/behavior).     unknown  . metFORMIN (GLUCOPHAGE) 500 MG tablet Take 500 mg by mouth daily.    01/08/2016 at Unknown time  . QUEtiapine (SEROQUEL) 200 MG tablet Take 200 mg by mouth 2 (two) times daily.   01/08/2016 at Unknown time  . risperiDONE (RISPERDAL) 1 MG tablet Take 1 tablet (1 mg total) by mouth 3 (three) times daily. (Patient taking differently: Take 1 mg by mouth 2 (two) times daily. ) 90 tablet 2 01/08/2016 at Unknown time  . trihexyphenidyl (ARTANE) 5 MG tablet Take 5 mg by mouth 2 (two) times daily with a meal.   01/08/2016 at Unknown time  . omeprazole (PRILOSEC) 20 MG capsule 1 PO WITH BREAKFAST. (Patient not taking: Reported on 01/09/2016) 90 capsule 3 Not Taking at Unknown time   Scheduled: . sodium chloride   Intravenous STAT  . cloNIDine  0.3 mg Transdermal Weekly  . enoxaparin (LOVENOX) injection  40 mg Subcutaneous Q24H  . insulin aspart  0-15 Units Subcutaneous TID WC  . insulin aspart  0-5 Units Subcutaneous QHS  . labetalol  20 mg Intravenous Q6H   Continuous: . sodium chloride 75 mL/hr at 01/09/16 0831  . dextrose 5 % and 0.45% NaCl     PRN:  Assesment: He has altered mental status with acute encephalopathy. This is likely metabolic. At baseline he has difficult to control hypertension and since he's not really able to take anything by mouth now I switched him to IV medications for that. He was scheduled to be on an insulin drip but he appears to have resolved the DKA so I'm going to put him on sliding scale for now.  At baseline he has mental illness and  I'm not sure that's fully defined but he frequently becomes very combative and very agitated and he is currently on Geodon and calm.  He was dehydrated and appeared to have acute kidney injury on admission.   Active Problems:   Altered mental status   Dehydration   DKA (diabetic ketoacidoses) (HCC)   Encephalopathy acute    Plan: Start sliding scale insulin. Switch him to IV medications for his blood pressure. Continue Geodon  as needed. If he wakes up and will take his oral medications we can switch him back. I put him on a Catapres patch. Recheck laboratory work in the morning. Watch his blood sugar carefully    LOS: 0 days   Shae Augello L 01/09/2016, 8:59 AM

## 2016-01-09 NOTE — ED Notes (Signed)
Patient back to CT at this time.

## 2016-01-09 NOTE — ED Notes (Signed)
Per nursing home staff, pt has hx of altered mental status, aggressive behavior, has standing order for ativan 1mg   im

## 2016-01-09 NOTE — ED Notes (Signed)
Patient's room changed to stepdown. Transport to 3rd floor stopped and patient returned to room.

## 2016-01-09 NOTE — ED Notes (Signed)
Patient remains disoriented and confused at times. Swinging legs across bed rails. Seizure pads in place to keep patient from placing legs out of bed rails.  Unable to reorient patient.

## 2016-01-09 NOTE — ED Notes (Signed)
Patient has yellow fall risk bracelet and yellow socks.

## 2016-01-10 DIAGNOSIS — E1310 Other specified diabetes mellitus with ketoacidosis without coma: Secondary | ICD-10-CM

## 2016-01-10 DIAGNOSIS — I10 Essential (primary) hypertension: Secondary | ICD-10-CM

## 2016-01-10 DIAGNOSIS — E785 Hyperlipidemia, unspecified: Secondary | ICD-10-CM

## 2016-01-10 DIAGNOSIS — N179 Acute kidney failure, unspecified: Principal | ICD-10-CM

## 2016-01-10 LAB — BLOOD GAS, ARTERIAL
Acid-base deficit: 2 mmol/L (ref 0.0–2.0)
BICARBONATE: 22.7 meq/L (ref 20.0–24.0)
Drawn by: 23534
FIO2: 0.21
O2 Content: 21 L/min
O2 SAT: 90.3 %
PO2 ART: 59.5 mmHg — AB (ref 80.0–100.0)
pCO2 arterial: 37.4 mmHg (ref 35.0–45.0)
pH, Arterial: 7.391 (ref 7.350–7.450)

## 2016-01-10 LAB — GLUCOSE, CAPILLARY
GLUCOSE-CAPILLARY: 126 mg/dL — AB (ref 65–99)
Glucose-Capillary: 274 mg/dL — ABNORMAL HIGH (ref 65–99)
Glucose-Capillary: 136 mg/dL — ABNORMAL HIGH (ref 65–99)

## 2016-01-10 LAB — BASIC METABOLIC PANEL
ANION GAP: 8 (ref 5–15)
BUN: 25 mg/dL — ABNORMAL HIGH (ref 6–20)
CHLORIDE: 111 mmol/L (ref 101–111)
CO2: 24 mmol/L (ref 22–32)
CREATININE: 1.46 mg/dL — AB (ref 0.61–1.24)
Calcium: 8.4 mg/dL — ABNORMAL LOW (ref 8.9–10.3)
GFR calc non Af Amer: 51 mL/min — ABNORMAL LOW (ref >60)
GFR, EST AFRICAN AMERICAN: 59 mL/min — AB (ref >60)
Glucose, Bld: 220 mg/dL — ABNORMAL HIGH (ref 65–99)
Potassium: 4.4 mmol/L (ref 3.5–5.1)
Sodium: 143 mmol/L (ref 135–145)

## 2016-01-10 MED ORDER — CHLORHEXIDINE GLUCONATE CLOTH 2 % EX PADS
6.0000 | MEDICATED_PAD | Freq: Every day | CUTANEOUS | Status: DC
  Administered 2016-01-11: 6 via TOPICAL

## 2016-01-10 MED ORDER — DIVALPROEX SODIUM 125 MG PO CSDR
375.0000 mg | DELAYED_RELEASE_CAPSULE | Freq: Three times a day (TID) | ORAL | Status: DC
  Administered 2016-01-10 – 2016-01-12 (×6): 375 mg via ORAL
  Filled 2016-01-10 (×13): qty 3

## 2016-01-10 MED ORDER — ATORVASTATIN CALCIUM 10 MG PO TABS
10.0000 mg | ORAL_TABLET | Freq: Every day | ORAL | Status: DC
  Administered 2016-01-11 – 2016-01-12 (×2): 10 mg via ORAL
  Filled 2016-01-10 (×2): qty 1

## 2016-01-10 MED ORDER — ZIPRASIDONE MESYLATE 20 MG IM SOLR
INTRAMUSCULAR | Status: AC
  Filled 2016-01-10: qty 20

## 2016-01-10 MED ORDER — LABETALOL HCL 200 MG PO TABS
200.0000 mg | ORAL_TABLET | Freq: Two times a day (BID) | ORAL | Status: DC
  Administered 2016-01-10 – 2016-01-12 (×4): 200 mg via ORAL
  Filled 2016-01-10 (×4): qty 1

## 2016-01-10 MED ORDER — TRIHEXYPHENIDYL HCL 2 MG PO TABS
5.0000 mg | ORAL_TABLET | Freq: Two times a day (BID) | ORAL | Status: DC
  Administered 2016-01-10 – 2016-01-12 (×4): 5 mg via ORAL
  Filled 2016-01-10 (×4): qty 3

## 2016-01-10 MED ORDER — QUETIAPINE FUMARATE 200 MG PO TABS
200.0000 mg | ORAL_TABLET | Freq: Two times a day (BID) | ORAL | Status: DC
  Administered 2016-01-10 – 2016-01-12 (×4): 200 mg via ORAL
  Filled 2016-01-10 (×9): qty 1

## 2016-01-10 MED ORDER — RISPERIDONE 1 MG PO TABS
1.0000 mg | ORAL_TABLET | Freq: Two times a day (BID) | ORAL | Status: DC
  Administered 2016-01-10 – 2016-01-12 (×4): 1 mg via ORAL
  Filled 2016-01-10 (×4): qty 1

## 2016-01-10 MED ORDER — MUPIROCIN 2 % EX OINT
1.0000 "application " | TOPICAL_OINTMENT | Freq: Two times a day (BID) | CUTANEOUS | Status: DC
  Administered 2016-01-10 – 2016-01-12 (×5): 1 via NASAL
  Filled 2016-01-10: qty 22

## 2016-01-10 MED ORDER — LORAZEPAM 2 MG/ML IJ SOLN
1.0000 mg | INTRAMUSCULAR | Status: DC | PRN
  Administered 2016-01-10 – 2016-01-11 (×4): 1 mg via INTRAVENOUS
  Filled 2016-01-10 (×4): qty 1

## 2016-01-10 NOTE — Progress Notes (Signed)
PROGRESS NOTE    Derrick Butler  [id number] DOB: Jan 30, 1956 DOA: 01/09/2016 PCP: No primary care provider on file.  Outpatient Specialists:  Gastroenterology: Dr. Oneida Alar.   Brief Narrative:  60 yo male who is currently residing in [facility] home with past medical history of CVA, aortic dissection, DM, bipolar disorder, CAD. Presented from nursing home with complaints of acute encephalopathy and fall. It is reported that the patient is having recurrent issues with aggression and was noted to be extremely agitated when EMS was called. Ativan IM was given to patient enroute to the ER and on arrival he was noted to be somnolent and did not follow commands. In the ED patient had to be given Ativan, Geodon for agitation. He underwent imaging studies which showed fractures of distal fibula and medial malleolu and lab work revealed mild DKA with AG 16. He was admitted for further evaluation and monitoring.   Assessment & Plan:  Active Problems:   Altered mental status   Dehydration   DKA (diabetic ketoacidoses) (HCC)   Encephalopathy acute  1. Acute encephalopathy, etiology unclear. Dehydration has improved. He has not had any events of infection. UA and CXR negative, urine culture in process. CT Head unremarkable. Ammonia is wnl. Will check ABG. Patient becomes acutely agitated will continue sedates PRN.  2. AKI, likely related to dehydration. Improved with IVF. Continue to monitor.  3. Ankle fracture.  fracture of distal fibula and medial malleolus , which has been put under splint. Patient will need to follow-up with orthopedics as outpatient  4. Diabetes mellitus type 2. Oral hypoglycemic agents held at this time, patient on SSI. Blood sugars are now stable.  5. Bipolar disorder. Continue home medications.  6. HTN. Continue home medications.  7. HLD. Continue statins.   DVT prophylaxis: Lovenox Code Status: Full Family Communication: No family bedside Disposition Plan: Discharge  back to nursing home in 2-3 days.   Consultants:     Procedures:     Antimicrobials:    Subjective: Patient sedated. Unable to obtain history.   Objective: Filed Vitals:   01/10/16 0503 01/10/16 0600 01/10/16 0608 01/10/16 0813  BP:  173/150 181/86 177/81  Pulse: 67 79 75 63  Temp:      TempSrc:      Resp: 24 19 26 25   Height:      Weight:      SpO2: 100% 91% 91% 99%    Intake/Output Summary (Last 24 hours) at 01/10/16 1017 Last data filed at 01/10/16 0600  Gross per 24 hour  Intake 1611.25 ml  Output    252 ml  Net 1359.25 ml   Filed Weights   01/09/16 0115 01/10/16 0500  Weight: 82.555 kg (182 lb) 79.379 kg (175 lb)    Examination:  General exam: Sedated appears restless  Respiratory system: Clear to auscultation. Respiratory effort normal. Cardiovascular system: S1 & S2 heard, RRR. No JVD, murmurs, rubs, gallops or clicks. No pedal edema. Gastrointestinal system: Abdomen is nondistended, soft and nontender. No organomegaly or masses felt. Normal bowel sounds heard. Central nervous system:  No focal neurological deficits. Extremities: Symmetric 5 x 5 power. Skin: No rashes, lesions or ulcers Psychiatry: Can not assess due to mental status.     Data Reviewed: I have personally reviewed following labs and imaging studies  CBC:  Recent Labs Lab 01/09/16 0126  WBC 4.7  NEUTROABS 2.5  HGB 12.8*  HCT 36.1*  MCV 87.6  PLT [id number]*   Basic Metabolic Panel:  Recent  Labs Lab 01/09/16 0126 01/09/16 0757 01/09/16 1149 01/09/16 1537 01/10/16 0454  NA 138 142 142 144 143  K 4.4 4.2 4.2 4.1 4.4  CL 101 108 108 108 111  CO2 21* 25 25 26 24   GLUCOSE 482* 209* 211* 158* 220*  BUN 30* 29* 28* 26* 25*  CREATININE 2.02* 1.66* 1.68* 1.63* 1.46*  CALCIUM 8.9 8.8* 8.6* 8.7* 8.4*   GFR: Estimated Creatinine Clearance: 52.7 mL/min (by C-G formula based on Cr of 1.46). Liver Function Tests:  Recent Labs Lab 01/09/16 0126  AST 16  ALT 12*  ALKPHOS 82    BILITOT 0.5  PROT 6.7  ALBUMIN 3.7   No results for input(s): LIPASE, AMYLASE in the last 168 hours.  Recent Labs Lab 01/09/16 0206  AMMONIA 30   Coagulation Profile:  Recent Labs Lab 01/09/16 0126  INR 1.10     Recent Labs Lab 01/09/16 0842 01/09/16 1138 01/09/16 1622 01/09/16 2129 01/10/16 0717  GLUCAP 200* 174* 129* 148* 274*   Urine analysis:    Component Value Date/Time   COLORURINE YELLOW 01/09/2016 0636   APPEARANCEUR CLEAR 01/09/2016 0636   LABSPEC 1.015 01/09/2016 0636   PHURINE 5.0 01/09/2016 0636   GLUCOSEU >1000* 01/09/2016 0636   HGBUR NEGATIVE 01/09/2016 0636   BILIRUBINUR NEGATIVE 01/09/2016 0636   KETONESUR NEGATIVE 01/09/2016 0636   PROTEINUR NEGATIVE 01/09/2016 0636   UROBILINOGEN 1.0 10/07/2014 1726   NITRITE NEGATIVE 01/09/2016 0636   LEUKOCYTESUR NEGATIVE 01/09/2016 0636   Sepsis Labs: @LABRCNTIP (procalcitonin:4,lacticidven:4)  )No results found for this or any previous visit (from the past 240 hour(s)).       Radiology Studies: Dg Chest 1 View  01/09/2016  CLINICAL DATA:  [place] out of bed. EXAM: CHEST 1 VIEW COMPARISON:  12/18/2015 FINDINGS: There is mild cardiomegaly and aortic tortuosity, unchanged. The lungs are clear. There is no large effusion. The pulmonary vasculature is normal. IMPRESSION: Unchanged cardiomegaly.  No acute cardiopulmonary findings. Electronically Signed   By: Andreas Newport M.D.   On: 01/09/2016 03:45   Dg Ankle Complete Right  01/09/2016  CLINICAL DATA:  [place] out of bed EXAM: RIGHT ANKLE - COMPLETE 3+ VIEW COMPARISON:  None. FINDINGS: There is a nondisplaced transverse fracture across the medial malleolus. There is a nondisplaced oblique fracture of the distal fibula. The mortise is symmetric. IMPRESSION: Fractures of the distal fibula and medial malleolus. Electronically Signed   By: Andreas Newport M.D.   On: 01/09/2016 03:46   Ct Head Wo Contrast  01/09/2016  CLINICAL DATA:  Patient fell from bed.  Altered mental status. Unable to cooperate and combative. EXAM: CT HEAD WITHOUT CONTRAST CT CERVICAL SPINE WITHOUT CONTRAST TECHNIQUE: Multidetector CT imaging of the head and cervical spine was performed following the standard protocol without intravenous contrast. Multiplanar CT image reconstructions of the cervical spine were also generated. COMPARISON:  CT head 12/18/2015 FINDINGS: CT HEAD FINDINGS Diffuse cerebral atrophy. Ventricular dilatation consistent with central atrophy. Low-attenuation changes in the deep white matter consistent with small vessel ischemia. Old lacunar infarct in the left basal ganglia. Old area of encephalomalacia involving the right occipital, parietal, and temporal regions with associated volume loss causing asymmetric dilatation of the right lateral ventricle. This is consistent with old infarct. No change since previous study. No mass effect or midline shift. No abnormal extra-axial fluid collections. Gray-white matter junctions are distinct. Basal cisterns are not effaced. No evidence of acute intracranial hemorrhage. No depressed skull fractures. Mucosal thickening in the paranasal sinuses. Mastoid air cells  are not opacified. Vascular calcifications. CT CERVICAL SPINE FINDINGS Normal alignment of the cervical spine. No vertebral compression deformities. Mild endplate hypertrophic changes. Intervertebral disc space heights are mostly preserved. No prevertebral soft tissue swelling. No focal bone lesion or bone destruction. Bone cortex and trabecular architecture appears intact. C1-2 articulation appears intact. The head is tilted towards the left. This could be due to positioning or may indicate muscle spasm. Soft tissues are unremarkable. Vascular calcifications. No acute intracranial abnormalities. Chronic atrophy and small vessel ischemic changes. Old infarct in the right temporal parietal occipital region. Normal alignment of the cervical spine. Mild degenerative changes. No  acute displaced fractures identified. Electronically Signed   By: Lucienne Capers M.D.   On: 01/09/2016 05:06   Ct Cervical Spine Wo Contrast  01/09/2016  CLINICAL DATA:  Patient fell from bed. Altered mental status. Unable to cooperate and combative. EXAM: CT HEAD WITHOUT CONTRAST CT CERVICAL SPINE WITHOUT CONTRAST TECHNIQUE: Multidetector CT imaging of the head and cervical spine was performed following the standard protocol without intravenous contrast. Multiplanar CT image reconstructions of the cervical spine were also generated. COMPARISON:  CT head 12/18/2015 FINDINGS: CT HEAD FINDINGS Diffuse cerebral atrophy. Ventricular dilatation consistent with central atrophy. Low-attenuation changes in the deep white matter consistent with small vessel ischemia. Old lacunar infarct in the left basal ganglia. Old area of encephalomalacia involving the right occipital, parietal, and temporal regions with associated volume loss causing asymmetric dilatation of the right lateral ventricle. This is consistent with old infarct. No change since previous study. No mass effect or midline shift. No abnormal extra-axial fluid collections. Gray-white matter junctions are distinct. Basal cisterns are not effaced. No evidence of acute intracranial hemorrhage. No depressed skull fractures. Mucosal thickening in the paranasal sinuses. Mastoid air cells are not opacified. Vascular calcifications. CT CERVICAL SPINE FINDINGS Normal alignment of the cervical spine. No vertebral compression deformities. Mild endplate hypertrophic changes. Intervertebral disc space heights are mostly preserved. No prevertebral soft tissue swelling. No focal bone lesion or bone destruction. Bone cortex and trabecular architecture appears intact. C1-2 articulation appears intact. The head is tilted towards the left. This could be due to positioning or may indicate muscle spasm. Soft tissues are unremarkable. Vascular calcifications. No acute intracranial  abnormalities. Chronic atrophy and small vessel ischemic changes. Old infarct in the right temporal parietal occipital region. Normal alignment of the cervical spine. Mild degenerative changes. No acute displaced fractures identified. Electronically Signed   By: Lucienne Capers M.D.   On: 01/09/2016 05:06        Scheduled Meds: . cloNIDine  0.3 mg Transdermal Weekly  . enoxaparin (LOVENOX) injection  40 mg Subcutaneous Q24H  . insulin aspart  0-15 Units Subcutaneous TID WC  . insulin aspart  0-5 Units Subcutaneous QHS  . labetalol  20 mg Intravenous Q6H   Continuous Infusions: . sodium chloride 75 mL/hr at 01/10/16 0600     LOS: 1 day    Time spent: 25 minutes     Kathie Dike, MD  [facility] Pager [phone]  If 7PM-7AM, please contact night-coverage [email or web address] Password [email or web address] 01/10/2016, 10:17 AM    By signing my name below, I, Rennis Harding, attest that this documentation has been prepared under the direction and in the presence of Kathie Dike, MD. Electronically signed: Rennis Harding, Scribe. 01/10/2016 11:08am  I, Dr. Kathie Dike, personally performed the services described in this documentaiton. All medical record entries made by the scribe were at my direction and in my presence. I have  reviewed the chart and agree that the record reflects my personal performance and is accurate and complete  Kathie Dike, MD, 01/10/2016 11:25 AM

## 2016-01-10 NOTE — Care Management Note (Signed)
Case Management Note  Patient Details  Name: Derrick Butler MRN: [id number] Date of Birth: Jul 17, 1956  Subjective/Objective:       Patient admitted for dehydration. Patient from [facility], [place] aware. Anticipate return back to [facility] at discharge.              Action/Plan: NO CM needs.   Expected Discharge Date:                  Expected Discharge Plan:  Skilled Nursing Facility  In-House Referral:  Clinical Social Work  Discharge planning Services  CM Consult  Post Acute Care Choice:  NA Choice offered to:  NA  DME Arranged:    DME Agency:     HH Arranged:    [place] Agency:     Status of Service:  Completed, signed off  Medicare Important Message Given:    Date Medicare IM Given:    Medicare IM give by:    Date Additional Medicare IM Given:    Additional Medicare Important Message give by:     If discussed at [place] of Stay Meetings, dates discussed:    Additional Comments:  T[place]anga [place]taway, Chauncey Reading, RN 01/10/2016, 1:24 PM

## 2016-01-10 NOTE — Telephone Encounter (Signed)
Tried to call again and mail box full.

## 2016-01-11 ENCOUNTER — Telehealth: Payer: Self-pay

## 2016-01-11 DIAGNOSIS — D696 Thrombocytopenia, unspecified: Secondary | ICD-10-CM

## 2016-01-11 DIAGNOSIS — N183 Chronic kidney disease, stage 3 (moderate): Secondary | ICD-10-CM

## 2016-01-11 DIAGNOSIS — F3160 Bipolar disorder, current episode mixed, unspecified: Secondary | ICD-10-CM

## 2016-01-11 LAB — CBC
HCT: 34.8 % — ABNORMAL LOW (ref 39.0–52.0)
HEMOGLOBIN: 11.9 g/dL — AB (ref 13.0–17.0)
MCH: 31 pg (ref 26.0–34.0)
MCHC: 34.2 g/dL (ref 30.0–36.0)
MCV: 90.6 fL (ref 78.0–100.0)
Platelets: 83 10*3/uL — ABNORMAL LOW (ref 150–400)
RBC: 3.84 MIL/uL — AB (ref 4.22–5.81)
RDW: 12.3 % (ref 11.5–15.5)
WBC: 6.1 10*3/uL (ref 4.0–10.5)

## 2016-01-11 LAB — URINE CULTURE: Culture: NO GROWTH

## 2016-01-11 LAB — GLUCOSE, CAPILLARY
GLUCOSE-CAPILLARY: 112 mg/dL — AB (ref 65–99)
Glucose-Capillary: 162 mg/dL — ABNORMAL HIGH (ref 65–99)
Glucose-Capillary: 163 mg/dL — ABNORMAL HIGH (ref 65–99)
Glucose-Capillary: 168 mg/dL — ABNORMAL HIGH (ref 65–99)
Glucose-Capillary: 204 mg/dL — ABNORMAL HIGH (ref 65–99)

## 2016-01-11 LAB — BASIC METABOLIC PANEL
Anion gap: 9 (ref 5–15)
BUN: 23 mg/dL — AB (ref 6–20)
CHLORIDE: 111 mmol/L (ref 101–111)
CO2: 24 mmol/L (ref 22–32)
Calcium: 8.4 mg/dL — ABNORMAL LOW (ref 8.9–10.3)
Creatinine, Ser: 1.39 mg/dL — ABNORMAL HIGH (ref 0.61–1.24)
GFR calc Af Amer: >60 mL/min (ref >60)
GFR calc non Af Amer: 54 mL/min — ABNORMAL LOW (ref >60)
GLUCOSE: 189 mg/dL — AB (ref 65–99)
POTASSIUM: 4.4 mmol/L (ref 3.5–5.1)
Sodium: 144 mmol/L (ref 135–145)

## 2016-01-11 NOTE — Telephone Encounter (Signed)
Holley Raring is aware of results.

## 2016-01-11 NOTE — Care Management Important Message (Signed)
Important Message  Patient Details  Name: Derrick Butler MRN: [id number] Date of Birth: 05/28/1956   Medicare Important Message Given:  Yes    Sherald Barge, RN 01/11/2016, 11:50 AM

## 2016-01-11 NOTE — Progress Notes (Signed)
PROGRESS NOTE    Derrick Butler  [id number] DOB: [birth year] DOA: 01/09/2016 PCP: No primary care provider on file.  Outpatient Specialists:   Gastroenterology: Dr. Oneida Alar.   Brief Narrative:  60 yo male who is currently residing in [facility] home with past medical history of CVA, aortic dissection, DM, bipolar disorder, CAD. Presented from nursing home with complaints of acute encephalopathy and fall. It is reported that the patient is having recurrent issues with aggression and was noted to be extremely agitated when EMS was called. Ativan IM was given to patient enroute to the ER and on arrival he was noted to be somnolent and did not follow commands. In the ED patient had to be given Ativan, Geodon for agitation. He underwent imaging studies which showed fractures of distal fibula and medial malleolu and lab work revealed mild DKA with AG 16. He was admitted for further evaluation and monitoring.   Assessment & Plan:  Active Problems:   Essential hypertension   Bipolar I disorder, most recent episode mixed (HCC)   Dehydration   CKD (chronic kidney disease) stage 3, GFR 30-59 ml/min   Thrombocytopenia (HCC)   Acute encephalopathy   Acute renal failure (HCC)   DKA (diabetic ketoacidoses) (HCC)   Encephalopathy acute  1. Acute encephalopathy, possibly related to dehydration. Dehydration has improved with IVFs. He does not had any events of infection. UA and CXR negative.  Urine culture no growth 24 hrs. CT Head unremarkable. Ammonia is wnl. ABG showed normal pH and pCO2. Per staff, his mental status appears to be improving and he is becoming more cooperative. Continue sedatives PRN.  2. AKI, likely related to dehydration. Improved with IVF. Continue to monitor.  3. Ankle fracture.  Fracture of distal fibula and medial malleolus, which has been put under splint. Patient will need to follow-up with orthopedics as outpatient  4. Thrombocytopenia. Chronic. Will stop  lovenox. 5. CKD stage 3. Cr 1.39. Receiving IVFs. Continue to monitor. 6. Diabetes mellitus type 2. Oral hypoglycemic agents held at this time, patient on SSI. Blood sugars are now stable.  7. Bipolar disorder. Continue home medications.  8. HTN. Stable. Continue home medications.  9. HLD. Continue statins.   DVT prophylaxis: SCD's Code Status: Full Family Communication: No family bedside Disposition Plan: Discharge back to nursing home in 1-2 days.   Consultants:   none  Procedures:   none  Antimicrobials:     Subjective: Patient sedated. Unable to obtain history.   Objective: Filed Vitals:   01/11/16 0100 01/11/16 0200 01/11/16 0300 01/11/16 0400  BP: 122/59 131/63 153/71 175/73  Pulse: 61 58 59 56  Temp:    97.2 F (36.2 C)  TempSrc:    Axillary  Resp: 23 21 21 16   Height:      Weight:      SpO2: 93% 95% 94% 97%    Intake/Output Summary (Last 24 hours) at 01/11/16 0625 Last data filed at 01/11/16 0500  Gross per 24 hour  Intake    315 ml  Output    750 ml  Net   -435 ml   Filed Weights   01/09/16 0115 01/10/16 0500  Weight: 82.555 kg (182 lb) 79.379 kg (175 lb)    Examination: General exam: Does not appear to be in any acute distress Respiratory system: Clear to auscultation. Respiratory effort normal. Cardiovascular system: S1 & S2 heard, RRR. No JVD, murmurs, rubs, gallops or clicks. No pedal edema. Gastrointestinal system: Abdomen is nondistended, soft and nontender.  No organomegaly or masses felt. Normal bowel sounds heard. Central nervous system:  No focal neurological deficits. Extremities: Symmetric 5 x 5 power. Skin: No rashes, lesions or ulcers Psychiatry: Can not assess due to somnolence.    Data Reviewed: I have personally reviewed following labs and imaging studies  CBC:  Recent Labs Lab 01/09/16 0126 01/11/16 0511  WBC 4.7 6.1  NEUTROABS 2.5  --   HGB 12.8* 11.9*  HCT 36.1* 34.8*  MCV 87.6 90.6  PLT 103* 83*   Basic  Metabolic Panel:  Recent Labs Lab 01/09/16 0757 01/09/16 1149 01/09/16 1537 01/10/16 0454 01/11/16 0511  NA 142 142 144 143 144  K 4.2 4.2 4.1 4.4 4.4  CL 108 108 108 111 111  CO2 25 25 26 24 24   GLUCOSE 209* 211* 158* 220* 189*  BUN 29* 28* 26* 25* 23*  CREATININE 1.66* 1.68* 1.63* 1.46* 1.39*  CALCIUM 8.8* 8.6* 8.7* 8.4* 8.4*   GFR: Estimated Creatinine Clearance: 55.4 mL/min (by C-G formula based on Cr of 1.39). Liver Function Tests:  Recent Labs Lab 01/09/16 0126  AST 16  ALT 12*  ALKPHOS 82  BILITOT 0.5  PROT 6.7  ALBUMIN 3.7   No results for input(s): LIPASE, AMYLASE in the last 168 hours.  Recent Labs Lab 01/09/16 0206  AMMONIA 30   Coagulation Profile:  Recent Labs Lab 01/09/16 0126  INR 1.10     Recent Labs Lab 01/09/16 2129 01/10/16 0717 01/10/16 1056 01/10/16 1603 01/11/16 0003  GLUCAP 148* 274* 126* 136* 168*   Urine analysis:    Component Value Date/Time   COLORURINE YELLOW 01/09/2016 0636   APPEARANCEUR CLEAR 01/09/2016 0636   LABSPEC 1.015 01/09/2016 0636   PHURINE 5.0 01/09/2016 0636   GLUCOSEU >1000* 01/09/2016 0636   HGBUR NEGATIVE 01/09/2016 0636   BILIRUBINUR NEGATIVE 01/09/2016 0636   KETONESUR NEGATIVE 01/09/2016 0636   PROTEINUR NEGATIVE 01/09/2016 0636   UROBILINOGEN 1.0 10/07/2014 1726   NITRITE NEGATIVE 01/09/2016 0636   LEUKOCYTESUR NEGATIVE 01/09/2016 0636   Sepsis Labs: @LABRCNTIP (procalcitonin:4,lacticidven:4)  ) Recent Results (from the past 240 hour(s))  Urine culture     Status: None (Preliminary result)   Collection Time: 01/09/16  7:50 AM  Result Value Ref Range Status   Specimen Description URINE, CLEAN CATCH  Final   Special Requests NONE  Final   Culture   Final    NO GROWTH < 24 HOURS Performed at [facility]    Report Status PENDING  Incomplete     Radiology Studies: No results found.   Scheduled Meds: . atorvastatin  10 mg Oral Daily  . Chlorhexidine Gluconate Cloth  6 each  Topical Q0600  . cloNIDine  0.3 mg Transdermal Weekly  . divalproex  375 mg Oral TID  . enoxaparin (LOVENOX) injection  40 mg Subcutaneous Q24H  . insulin aspart  0-15 Units Subcutaneous TID WC  . insulin aspart  0-5 Units Subcutaneous QHS  . labetalol  200 mg Oral BID  . labetalol  20 mg Intravenous Q6H  . mupirocin ointment  1 application Nasal BID  . QUEtiapine  200 mg Oral BID  . risperiDONE  1 mg Oral BID  . trihexyphenidyl  5 mg Oral BID WC   Continuous Infusions: . sodium chloride 75 mL/hr at 01/11/16 0022     LOS: 2 days    Time spent: 25 minutes    Kathie Dike, MD  [facility] Pager [phone]  If 7PM-7AM, please contact night-coverage [email or web address] Password [email or web address]  01/11/2016, 6:25 AM    By signing my name below, I, Delene Ruffini, attest that this documentation has been prepared under the direction and in the presence of Kathie Dike, MD. Electronically Signed: Delene Ruffini 01/11/2016 9:50am  I, Dr. Kathie Dike, personally performed the services described in this documentaiton. All medical record entries made by the scribe were at my direction and in my presence. I have reviewed the chart and agree that the record reflects my personal performance and is accurate and complete  Kathie Dike, MD, 01/11/2016 10:10 AM

## 2016-01-11 NOTE — Clinical Social Work Note (Signed)
Clinical Social Work Assessment  Patient Details  Name: Derrick Butler MRN: [id number] Date of Birth: [birth year]  Date of referral:  01/11/16               Reason for consult:  Other (Comment Required) (From [facility])                Permission sought to share information with:    Permission granted to share information::     Name::        Agency::     Relationship::     Contact Information:     Housing/Transportation Living arrangements for the past 2 months:  [place] of Information:  Facility, Other (Comment Required) (Legal Guardian, Engineer, maintenance) Patient Interpreter Needed:  None Criminal Activity/Legal Involvement Pertinent to Current Situation/Hospitalization:  No - Comment as needed Significant Relationships:  Other(Comment) (Legal Guardian, [place]) Lives with:  Facility Resident Do you feel safe going back to the place where you live?  Yes Need for family participation in patient care:  Yes (Comment)  Care giving concerns:  None identified, facility resident.  Social Worker assessment / plan:  CSW spoke with Debbie at [facility].  Patient has been a resident at the facility 4-5 years.  She advised that patient seldom leaves his room but when he does he uses a wheelchair.  She stated that all ADLs are completed by staff.  Debbie advised that patient's guardian, Holley Raring, is active and supportive.  She advised that patient can return to the facility at discharge.  CSW spoke with Holley Raring, legal guardian. She confirmed Debbie's statements. She was agreeable to patient returning to facility at discharge.   Employment status:  Disabled (Comment on whether or not currently receiving Disability) Insurance information:  Managed Medicare PT Recommendations:  Not assessed at this time Information / Referral to community resources:     Patient/Family's Response to care:  Holley Raring, legal guardian, is agreeable for patient to return to [facility] at discharge.   Patient/Family's  Understanding of and Emotional Response to Diagnosis, Current Treatment, and Prognosis:  Patient's guardian understands patient's diagnosis, treatment and prognosis.    Emotional Assessment Appearance:  Developmentally appropriate Attitude/Demeanor/Rapport:  Unable to Assess Affect (typically observed):  Unable to Assess Orientation:  Oriented to Self Alcohol / Substance use:  Not Applicable Psych involvement (Current and /or in the community):  No (Comment)  Discharge Needs  Concerns to be addressed:   (Discharge back to [facility]) Readmission within the last 30 days:  No Current discharge risk:  None Barriers to Discharge:  No Barriers Identified   Ihor Gully, LCSW 01/11/2016, 1:17 PM

## 2016-01-11 NOTE — NC FL2 (Signed)
[place] MEDICAID FL2 LEVEL OF CARE SCREENING TOOL     IDENTIFICATION  Patient Name: Derrick Butler Birthdate: [birth year] Sex: male Admission Date (Current Location): 01/09/2016  [place] and [place] Number:  [place] [facility]  ([id number]) Facility and Address:  [facility] [address] [place]      Provider Number: [id number]  Attending Physician Name and Address:  Kathie Dike, MD  Relative Name and Phone Number:       Current Level of Care: Hospital Recommended Level of Care: [place] Prior Approval Number:    Date Approved/Denied:   PASRR Number:  ([id number])  Discharge Plan: SNF    Current Diagnoses: Patient Active Problem List   Diagnosis Date Noted  . DKA (diabetic ketoacidoses) ([place]) 01/09/2016  . Encephalopathy acute 01/09/2016  . Encounter for screening colonoscopy 11/23/2015  . Dysphagia 11/23/2015  . Bacteremia 11/09/2014  . Hyperkalemia 09/25/2014  . Acute renal failure ([place]) 09/25/2014  . Hypoglycemia 09/25/2014  . HCAP (healthcare-associated pneumonia) 07/15/2014  . Acute encephalopathy 07/15/2014  . Sepsis ([place]) 07/15/2014  . Thrombocytopenia ([place]) 07/14/2014  . CAP (community acquired pneumonia) 07/13/2014  . Protein-calorie malnutrition, severe ([place]) 09/09/2013  . CKD (chronic kidney disease) stage 3, GFR 30-59 ml/min 09/09/2013  . CVA, old, cognitive deficits 09/06/2013  . UTI (lower urinary tract infection) 09/06/2013  . Dehydration 06/19/2013  . H/O: stroke 06/18/2013  . Altered mental status 06/18/2013  . Bipolar I disorder, most recent episode mixed ([place]) 06/02/2013  . CARDIOMYOPATHY OTHER DISEASES CLASSIFIED ELSW 02/21/2010  . Essential hypertension 03/12/2009  . AORTIC DISSECTION 03/12/2009    Orientation RESPIRATION BLADDER Height & Weight     Self  Normal Continent Weight: 175 lb (79.379 kg) Height:  5\' 8"  (172.7 cm)  BEHAVIORAL SYMPTOMS/MOOD NEUROLOGICAL BOWEL NUTRITION STATUS   Continent Diet (Diet Carb Modified)  AMBULATORY STATUS COMMUNICATION OF NEEDS Skin   Total Care Verbally Normal                       Personal Care Assistance Level of Assistance  Total care       Total Care Assistance: Maximum assistance   Functional Limitations Info  Sight, Hearing, Speech Sight Info: Adequate Hearing Info: Adequate Speech Info: Impaired    SPECIAL CARE FACTORS FREQUENCY                       Contractures      Additional Factors Info  Psychotropic, Insulin Sliding Scale     Psychotropic Info:  (Depakote, Seroquel, Risperdal, Ativan, Geodon) Insulin Sliding Scale Info:  (3 times daily with meal)       Current Medications (01/11/2016):  This is the current hospital active medication list Current Facility-Administered Medications  Medication Dose Route Frequency Provider Last Rate Last Dose  . 0.9 %  sodium chloride infusion   Intravenous Continuous Oswald Hillock, MD 75 mL/hr at 01/11/16 0022    . atorvastatin (LIPITOR) tablet 10 mg  10 mg Oral Daily Kathie Dike, MD   10 mg at 01/11/16 0900  . Chlorhexidine Gluconate Cloth 2 % PADS 6 each  6 each Topical Q0600 Kathie Dike, MD   6 each at 01/11/16 0600  . cloNIDine (CATAPRES - Dosed in mg/24 hr) patch 0.3 mg  0.3 mg Transdermal Weekly Sinda Du, MD   0.3 mg at 01/09/16 1015  . divalproex (DEPAKOTE SPRINKLE) capsule 375 mg  375 mg Oral TID Kathie Dike, MD  375 mg at 01/11/16 0900  . insulin aspart (novoLOG) injection 0-15 Units  0-15 Units Subcutaneous TID WC Sinda Du, MD   5 Units at 01/11/16 1201  . insulin aspart (novoLOG) injection 0-5 Units  0-5 Units Subcutaneous QHS Sinda Du, MD   0 Units at 01/09/16 2129  . labetalol (NORMODYNE) tablet 200 mg  200 mg Oral BID Kathie Dike, MD   200 mg at 01/11/16 0900  . LORazepam (ATIVAN) injection 1 mg  1 mg Intravenous Q2H PRN Sinda Du, MD   1 mg at 01/10/16 1741  . mupirocin ointment (BACTROBAN) 2 % 1 application  1  application Nasal BID Kathie Dike, MD   1 application at [id number]  . QUEtiapine (SEROQUEL) tablet 200 mg  200 mg Oral BID Kathie Dike, MD   200 mg at 01/11/16 0900  . risperiDONE (RISPERDAL) tablet 1 mg  1 mg Oral BID Kathie Dike, MD   1 mg at 01/11/16 0900  . trihexyphenidyl (ARTANE) tablet 5 mg  5 mg Oral BID WC Kathie Dike, MD   5 mg at 01/11/16 0808  . ziprasidone (GEODON) injection 20 mg  20 mg Intramuscular Q4H PRN Sinda Du, MD   20 mg at 01/10/16 2336     Discharge Medications: Please see discharge summary for a list of discharge medications.  Relevant Imaging Results:  Relevant Lab Results:   Additional Information    Fajr Fife, Clydene Pugh, LCSW

## 2016-01-11 NOTE — Plan of Care (Signed)
Pt shouting and kicking bed. Unable to console or find reason for distress.  Pt given geodon to assist with safety,

## 2016-01-11 NOTE — Telephone Encounter (Signed)
See result note, glenda is aware.

## 2016-01-11 NOTE — Telephone Encounter (Signed)
Derrick Butler had called saying she has a missed call from [place]. Please call her back at [phone].  It was regarding results on [employer]

## 2016-01-11 NOTE — Telephone Encounter (Signed)
[facility] for Glenda @ [phone] for a return call for results.

## 2016-01-12 DIAGNOSIS — N179 Acute kidney failure, unspecified: Secondary | ICD-10-CM

## 2016-01-12 LAB — GLUCOSE, CAPILLARY
Glucose-Capillary: 167 mg/dL — ABNORMAL HIGH (ref 65–99)
Glucose-Capillary: 174 mg/dL — ABNORMAL HIGH (ref 65–99)

## 2016-01-12 LAB — BASIC METABOLIC PANEL
Anion gap: 8 (ref 5–15)
BUN: 21 mg/dL — AB (ref 6–20)
CALCIUM: 8.3 mg/dL — AB (ref 8.9–10.3)
CO2: 26 mmol/L (ref 22–32)
Chloride: 109 mmol/L (ref 101–111)
Creatinine, Ser: 1.35 mg/dL — ABNORMAL HIGH (ref 0.61–1.24)
GFR calc Af Amer: >60 mL/min (ref >60)
GFR, EST NON AFRICAN AMERICAN: 56 mL/min — AB (ref >60)
GLUCOSE: 173 mg/dL — AB (ref 65–99)
POTASSIUM: 4.1 mmol/L (ref 3.5–5.1)
Sodium: 143 mmol/L (ref 135–145)

## 2016-01-12 LAB — CBC
HEMATOCRIT: 33.1 % — AB (ref 39.0–52.0)
Hemoglobin: 11.6 g/dL — ABNORMAL LOW (ref 13.0–17.0)
MCH: 31.4 pg (ref 26.0–34.0)
MCHC: 35 g/dL (ref 30.0–36.0)
MCV: 89.7 fL (ref 78.0–100.0)
Platelets: 83 10*3/uL — ABNORMAL LOW (ref 150–400)
RBC: 3.69 MIL/uL — ABNORMAL LOW (ref 4.22–5.81)
RDW: 12.3 % (ref 11.5–15.5)
WBC: 5.6 10*3/uL (ref 4.0–10.5)

## 2016-01-12 LAB — URINE CULTURE

## 2016-01-12 NOTE — Progress Notes (Signed)
PROGRESS NOTE  Derrick Butler [id number] DOB: 01-17-1956 DOA: 01/09/2016 PCP: No primary care provider on file. Outpatient Specialists:  Gastroenterology: Dr. Oneida Alar.  Brief Narrative: 17 yom who currently resides at [facility] home, with a hx of CVA, chronic encephalopathy, bipolar disorder, DM, presented from the nursing home with history of acute on chronic encephalopathy and fall. It was reported that he was having recurrent issues with aggression and was extremely agitated when EMS was called. While en route to the ED, he received Ativan IM and he became somnolent and could not follow commands. He also received Geodon for agitation. Imaging studies showed fractures of the distal fibula and medial malleolus. Lab work showed mild DKA with AG 16. He was admitted for DKA and acute and chronic encephalopathy. DKA resolved within a few hours of admission and he was transitioned to sliding scale insulin.  Assessment/Plan: 1. Acute encephalopathy resolved. Likely secondary to fall, pain, dehydration and mild diabetic ketoacidosis. Chronic encephalopathy persists, appreciate baseline. Infectious workup negative. Ammonia within normal limits. ABG unremarkable. Urine culture pending. 2. Diabetic ketoacidosis, mild, rapidly resolved. No evidence of infection. Urinalysis and chest x-ray unremarkable.  3. Acute kidney injury superimposed on chronic kidney disease stage III. Acute component resolved. 4. Ankle fracture distal right fibula and medial malleolus. Continue splint. Outpatient follow-up with orthopedics. 5. Diabetes mellitus type 2. Blood sugars stable. 6. Chronic thrombocytopenia, stable. 7. PMH stroke, chronic encephalopathy with baseline agitation and combativeness, bipolar disorder, psychosis. Maintenance medications include Ativan 1 mg IM as needed for aggressive behavior, Depakote, Seroquel, Respirdal, Artane.    Acute encephalopathy resolved. Compliant, taking medications.  Discussed with nursing staff at [facility] inpatient mentally at baseline.  Continue psychiatric medications, diabetic medications  Outpatient follow-up with orthopedics  Return to [facility] today, followup with Dr. Luna Glasgow, d/w with him, continue splint.  Discussed with healthcare power of attorney [name].    Murray Hodgkins, MD  [facility] Direct contact:  --Via amion app OR  --[email or web address]; password [email or web address] and click  [email or web address] contact night coverage as above 01/12/2016, 6:26 AM  LOS: 3 days   Consultants:  none  Procedures:  none  Antimicrobials:  none  HPI/Subjective: Feels all right. Reports some pain. Confused.  Objective: Filed Vitals:   01/12/16 0100 01/12/16 0200 01/12/16 0300 01/12/16 0400  BP:  159/90  133/107  Pulse: 58 55 59 58  Temp:    97 F (36.1 C)  TempSrc:    Oral  Resp: 12 17 12 17   Height:      Weight:      SpO2: 93% 87% 94% 97%    Intake/Output Summary (Last 24 hours) at 01/12/16 0626 Last data filed at 01/12/16 0500  Gross per 24 hour  Intake   2430 ml  Output    800 ml  Net   1630 ml     Filed Weights   01/09/16 0115 01/10/16 0500  Weight: 82.555 kg (182 lb) 79.379 kg (175 lb)    Exam:    Constitutional:  . Appears calm and comfortable. Wearing mitts. Follows simple commands. Eyes:  . PERRL and irises appear normal . Conjunctivae and lids appear normal ENMT:  . external ears, nose appear normal . grossly normal hearing  Respiratory:  . CTA bilaterally, no w/r/r.  . Respiratory effort normal. No retractions or accessory muscle use Cardiovascular:  . RRR, no m/r/g . No LE extremity edema   Abdomen:  . No tenderness or masses Musculoskeletal:  . Right leg in splint. Right  toes pink, well-perfused, normal sensation Psychiatric:  . Baseline confusion noted. Follows simple commands. . Mental status o Mood Cannot assess. Odd affect. o Orientation to person  I have personally reviewed following labs and imaging  studies:  BMP unremarkable  Creatinine at baseline  CBC stable  Anemia stable  Thrombocytopenia stable  Scheduled Meds: . atorvastatin  10 mg Oral Daily  . Chlorhexidine Gluconate Cloth  6 each Topical Q0600  . cloNIDine  0.3 mg Transdermal Weekly  . divalproex  375 mg Oral TID  . insulin aspart  0-15 Units Subcutaneous TID WC  . insulin aspart  0-5 Units Subcutaneous QHS  . labetalol  200 mg Oral BID  . mupirocin ointment  1 application Nasal BID  . QUEtiapine  200 mg Oral BID  . risperiDONE  1 mg Oral BID  . trihexyphenidyl  5 mg Oral BID WC   Continuous Infusions: . sodium chloride 75 mL/hr at 01/11/16 0022    Active Problems:   Essential hypertension   Bipolar I disorder, most recent episode mixed (HCC)   Dehydration   CKD (chronic kidney disease) stage 3, GFR 30-59 ml/min   Thrombocytopenia (HCC)   Acute encephalopathy   Acute renal failure (HCC)   DKA (diabetic ketoacidoses) (HCC)   Encephalopathy acute   LOS: 3 days   Time spent 25 minutes  By signing my name below, I, Delene Ruffini, attest that this documentation has been prepared under the direction and in the presence of Zeba. Sarajane Jews, MD. Electronically Signed: Delene Ruffini, Scribe.  01/12/2016 9:35am  I personally performed the services described in this documentation. All medical record entries made by the scribe were at my direction. I have reviewed the chart and agree that the record reflects my personal performance and is accurate and complete. Murray Hodgkins, MD

## 2016-01-12 NOTE — Progress Notes (Signed)
Patient picked up by [facility] transport to [facility]. Called report and gave information. Also called materials to pickup low bed.

## 2016-01-12 NOTE — Care Management Note (Signed)
Case Management Note  Patient Details  Name: CORON GRASER MRN: [id number] Date of Birth: September 17, 1955   Expected Discharge Date:    01/12/2016              Expected Discharge Plan:  [place]  In-House Referral:  Clinical Social Work  Discharge planning Services  CM Consult  Post Acute Care Choice:  NA Choice offered to:  NA  DME Arranged:    DME Agency:     HH Arranged:    [place] Agency:     Status of Service:  Completed, signed off  Medicare Important Message Given:  Yes Date Medicare IM Given:    Medicare IM give by:    Date Additional Medicare IM Given:    Additional Medicare Important Message give by:     If discussed at [employer] Meetings, dates discussed:    Additional Comments: Pt discharging today. CSW has made arrangements for pt to return to SNF.   Sherald Barge, RN 01/12/2016, 2:02 PM

## 2016-01-12 NOTE — Discharge Summary (Addendum)
Physician Discharge Summary  Derrick Butler [id number] DOB: 1955/10/01 DOA: 01/09/2016  PCP: No primary care provider on file. Physician at [facility] date: 01/09/2016 Discharge date: 01/12/2016  Recommendations for Outpatient Follow-up:  1. Right ankle fracture, nondisplaced. Per orthopedics continue splint, follow-up as an outpatient. Nonweightbearing. 2. Ongoing care for diabetes   Follow-up Information    Follow up with [facility] .   Specialty:  [place] information:   [address] [place] [phone]      Follow up with Sanjuana Kava, MD On 01/13/2016.   Specialty:  Orthopedic Surgery   Why:  9:45 AM   Contact information:   [place] [id number] [phone]      Discharge Diagnoses:  1. Acute encephalopathy 2. Diabetic ketoacidosis, mild 3. Acute kidney injury superimposed on chronic kidney disease stage III 4. Ankle fracture distal right fibula and medial malleolus 5. Diabetes mellitus type 2 6. 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 Chronic encephalopathy, stroke, bipolar disorder  Discharge Condition: improved Disposition: return to [facility]  Diet recommendation: diabetic diet, heart healthy  Filed Weights   01/09/16 0115 01/10/16 0500  Weight: 82.555 kg (182 lb) 79.379 kg (175 lb)    History of present illness:  47 yom who resides at [facility], with a hx of CVA, chronic encephalopathy, bipolar disorder, DM, presented from the nursing home with history of acute on chronic encephalopathy and fall. It was reported that he was having recurrent issues with aggression and was extremely agitated when EMS was called. While en route to the ED, he received Ativan IM and he became somnolent and could not follow commands. He also received Geodon for agitation. Imaging studies showed fractures of the distal fibula and medial malleolus. Lab work showed mild DKA with AG 16. He was  admitted for DKA and acute and chronic encephalopathy.   Hospital Course:  DKA resolved within a few hours of admission and he was transitioned to sliding scale insulin. There is no evidence of infection. Acute portion of encephalopathy resolved over the course of hospitalization and he is now at baseline. Acute kidney injury resolved with IV fluids, likely related to dehydration. In regard to right ankle fracture, this was discussed with orthopedics with recommendations for splint outpatient follow-up.   Acute encephalopathy resolved. Likely secondary to fall, pain, dehydration and mild diabetic ketoacidosis. Chronic encephalopathy persists, appreciate baseline. Infectious workup negative. Ammonia within normal limits. ABG unremarkable. Urine culture pending.  Diabetic ketoacidosis, mild, rapidly resolved. No evidence of infection. Urinalysis and chest x-ray unremarkable.   Acute kidney injury superimposed on chronic kidney disease stage III. Acute component resolved.  Ankle fracture distal right fibula and medial malleolus. Continue splint. Outpatient follow-up with orthopedics.  Diabetes mellitus type 2. Blood sugars stable.  Chronic thrombocytopenia, stable.  PMH stroke, chronic encephalopathy with baseline agitation and combativeness, bipolar disorder, psychosis. Maintenance medications include Ativan 1 mg IM as needed for aggressive behavior, Depakote, Seroquel, Respirdal, Artane.  Consultants:  none  Procedures:  none  Antimicrobials:  none  Discharge Instructions   Current Discharge Medication List    CONTINUE these medications which have NOT CHANGED   Details  aspirin 325 MG tablet Take 325 mg by mouth daily.      atorvastatin (LIPITOR) 10 MG tablet Take 10 mg by mouth daily.    baclofen (LIORESAL) 10 MG tablet Take 10 mg by mouth 3 (three) times daily.    BIOTIN PO  Take 30 mg by mouth daily.     cloNIDine (CATAPRES) 0.3 MG tablet Take 0.3 mg by mouth daily.       divalproex (DEPAKOTE SPRINKLE) 125 MG capsule Take 375 mg by mouth 3 (three) times daily.     fluticasone (FLONASE) 50 MCG/ACT nasal spray Place 2 sprays into the nose daily. For allergies    HYDROcodone-acetaminophen (NORCO/VICODIN) 5-325 MG tablet Take 1 tablet by mouth every 4 (four) hours as needed for moderate pain.    labetalol (NORMODYNE) 200 MG tablet Take 200 mg by mouth 2 (two) times daily.      LORazepam (ATIVAN) 2 MG/ML concentrated solution Inject 1 mg into the muscle every 6 (six) hours as needed for anxiety (aggressive/behavior).     metFORMIN (GLUCOPHAGE) 500 MG tablet Take 500 mg by mouth daily.     QUEtiapine (SEROQUEL) 200 MG tablet Take 200 mg by mouth 2 (two) times daily.    risperiDONE (RISPERDAL) 1 MG tablet Take 1 tablet (1 mg total) by mouth 3 (three) times daily. Qty: 90 tablet, Refills: 2    trihexyphenidyl (ARTANE) 5 MG tablet Take 5 mg by mouth 2 (two) times daily with a meal.    omeprazole (PRILOSEC) 20 MG capsule 1 PO WITH BREAKFAST. Qty: 90 capsule, Refills: 3       No Known Allergies  The results of significant diagnostics from this hospitalization (including imaging, microbiology, ancillary and laboratory) are listed below for reference.    Significant Diagnostic Studies: Dg Chest 1 View  01/09/2016  CLINICAL DATA:  [place] out of bed. EXAM: CHEST 1 VIEW COMPARISON:  12/18/2015 FINDINGS: There is mild cardiomegaly and aortic tortuosity, unchanged. The lungs are clear. There is no large effusion. The pulmonary vasculature is normal. IMPRESSION: Unchanged cardiomegaly.  No acute cardiopulmonary findings. Electronically Signed   By: Andreas Newport M.D.   On: 01/09/2016 03:45   Dg Ankle Complete Right  01/09/2016  CLINICAL DATA:  [place] out of bed EXAM: RIGHT ANKLE - COMPLETE 3+ VIEW COMPARISON:  None. FINDINGS: There is a nondisplaced transverse fracture across the medial malleolus. There is a nondisplaced oblique fracture of the distal fibula. The  mortise is symmetric. IMPRESSION: Fractures of the distal fibula and medial malleolus. Electronically Signed   By: Andreas Newport M.D.   On: 01/09/2016 03:46   Ct Head Wo Contrast  01/09/2016  CLINICAL DATA:  Patient fell from bed. Altered mental status. Unable to cooperate and combative. EXAM: CT HEAD WITHOUT CONTRAST CT CERVICAL SPINE WITHOUT CONTRAST TECHNIQUE: Multidetector CT imaging of the head and cervical spine was performed following the standard protocol without intravenous contrast. Multiplanar CT image reconstructions of the cervical spine were also generated. COMPARISON:  CT head 12/18/2015 FINDINGS: CT HEAD FINDINGS Diffuse cerebral atrophy. Ventricular dilatation consistent with central atrophy. Low-attenuation changes in the deep white matter consistent with small vessel ischemia. Old lacunar infarct in the left basal ganglia. Old area of encephalomalacia involving the right occipital, parietal, and temporal regions with associated volume loss causing asymmetric dilatation of the right lateral ventricle. This is consistent with old infarct. No change since previous study. No mass effect or midline shift. No abnormal extra-axial fluid collections. Gray-white matter junctions are distinct. Basal cisterns are not effaced. No evidence of acute intracranial hemorrhage. No depressed skull fractures. Mucosal thickening in the paranasal sinuses. Mastoid air cells are not opacified. Vascular calcifications. CT CERVICAL SPINE FINDINGS Normal alignment of the cervical spine. No vertebral compression deformities. Mild endplate hypertrophic changes. Intervertebral disc space  heights are mostly preserved. No prevertebral soft tissue swelling. No focal bone lesion or bone destruction. Bone cortex and trabecular architecture appears intact. C1-2 articulation appears intact. The head is tilted towards the left. This could be due to positioning or may indicate muscle spasm. Soft tissues are unremarkable. Vascular  calcifications. No acute intracranial abnormalities. Chronic atrophy and small vessel ischemic changes. Old infarct in the right temporal parietal occipital region. Normal alignment of the cervical spine. Mild degenerative changes. No acute displaced fractures identified. Electronically Signed   By: Lucienne Capers M.D.   On: 01/09/2016 05:06   Ct Cervical Spine Wo Contrast  01/09/2016  CLINICAL DATA:  Patient fell from bed. Altered mental status. Unable to cooperate and combative. EXAM: CT HEAD WITHOUT CONTRAST CT CERVICAL SPINE WITHOUT CONTRAST TECHNIQUE: Multidetector CT imaging of the head and cervical spine was performed following the standard protocol without intravenous contrast. Multiplanar CT image reconstructions of the cervical spine were also generated. COMPARISON:  CT head 12/18/2015 FINDINGS: CT HEAD FINDINGS Diffuse cerebral atrophy. Ventricular dilatation consistent with central atrophy. Low-attenuation changes in the deep white matter consistent with small vessel ischemia. Old lacunar infarct in the left basal ganglia. Old area of encephalomalacia involving the right occipital, parietal, and temporal regions with associated volume loss causing asymmetric dilatation of the right lateral ventricle. This is consistent with old infarct. No change since previous study. No mass effect or midline shift. No abnormal extra-axial fluid collections. Gray-white matter junctions are distinct. Basal cisterns are not effaced. No evidence of acute intracranial hemorrhage. No depressed skull fractures. Mucosal thickening in the paranasal sinuses. Mastoid air cells are not opacified. Vascular calcifications. CT CERVICAL SPINE FINDINGS Normal alignment of the cervical spine. No vertebral compression deformities. Mild endplate hypertrophic changes. Intervertebral disc space heights are mostly preserved. No prevertebral soft tissue swelling. No focal bone lesion or bone destruction. Bone cortex and trabecular  architecture appears intact. C1-2 articulation appears intact. The head is tilted towards the left. This could be due to positioning or may indicate muscle spasm. Soft tissues are unremarkable. Vascular calcifications. No acute intracranial abnormalities. Chronic atrophy and small vessel ischemic changes. Old infarct in the right temporal parietal occipital region. Normal alignment of the cervical spine. Mild degenerative changes. No acute displaced fractures identified. Electronically Signed   By: Lucienne Capers M.D.   On: 01/09/2016 05:06    Microbiology: Recent Results (from the past 240 hour(s))  Urine culture     Status: None   Collection Time: 01/09/16  7:50 AM  Result Value Ref Range Status   Specimen Description URINE, CLEAN CATCH  Final   Special Requests NONE  Final   Culture   Final    NO GROWTH 2 DAYS Performed at [facility]    Report Status 01/11/2016 FINAL  Final  Urine culture     Status: Abnormal   Collection Time: 01/11/16 12:00 PM  Result Value Ref Range Status   Specimen Description URINE, CLEAN CATCH  Final   Special Requests NONE  Final   Culture MULTIPLE SPECIES PRESENT, SUGGEST RECOLLECTION (A)  Final   Report Status 01/12/2016 FINAL  Final     Labs: Basic Metabolic Panel:  Recent Labs Lab 01/09/16 1149 01/09/16 1537 01/10/16 0454 01/11/16 0511 01/12/16 0444  NA 142 144 143 144 143  K 4.2 4.1 4.4 4.4 4.1  CL 108 108 111 111 109  CO2 25 26 24 24 26   GLUCOSE 211* 158* 220* 189* 173*  BUN 28* 26* 25*  23* 21*  CREATININE 1.68* 1.63* 1.46* 1.39* 1.35*  CALCIUM 8.6* 8.7* 8.4* 8.4* 8.3*   Liver Function Tests:  Recent Labs Lab 01/09/16 0126  AST 16  ALT 12*  ALKPHOS 82  BILITOT 0.5  PROT 6.7  ALBUMIN 3.7    Recent Labs Lab 01/09/16 0206  AMMONIA 30   CBC:  Recent Labs Lab 01/09/16 0126 01/11/16 0511 01/12/16 0444  WBC 4.7 6.1 5.6  NEUTROABS 2.5  --   --   HGB 12.8* 11.9* 11.6*  HCT 36.1* 34.8* 33.1*  MCV 87.6 90.6 89.7   PLT 103* 83* 83*     CBG:  Recent Labs Lab 01/11/16 0745 01/11/16 1120 01/11/16 1622 01/11/16 2034 01/12/16 0722  GLUCAP 162* 204* 163* 112* 167*    Principal Problem:   Acute encephalopathy Active Problems:   Essential hypertension   Bipolar I disorder, most recent episode mixed (HCC)   Dehydration   CKD (chronic kidney disease) stage 3, GFR 30-59 ml/min   Thrombocytopenia (HCC)   DKA (diabetic ketoacidoses) ([place])   AKI (acute kidney injury) ([place])   Time coordinating discharge: 45 minutes  Signed:  Murray Hodgkins, MD [facility] 01/12/2016, 1:56 PM

## 2016-01-12 NOTE — Clinical Social Work Note (Signed)
CSW facilitated discharge.    CSW notified Debbie at [facility] that patient is discharging today and will be returning to the facility.  CSW notified Holley Raring, guardian, that patient was being discharged.   CSW faxed clinicals via [employer].  CSW signing off.   Tsutomu Barfoot, Clydene Pugh, LCSW

## 2016-01-13 ENCOUNTER — Ambulatory Visit (INDEPENDENT_AMBULATORY_CARE_PROVIDER_SITE_OTHER): Payer: Medicare Other | Admitting: Orthopaedic Surgery

## 2016-01-13 ENCOUNTER — Encounter: Payer: Self-pay | Admitting: Orthopaedic Surgery

## 2016-01-13 VITALS — BP 116/64 | HR 66 | Temp 96.8°F | Ht 66.0 in | Wt 182.0 lb

## 2016-01-13 DIAGNOSIS — S82841A Displaced bimalleolar fracture of right lower leg, initial encounter for closed fracture: Secondary | ICD-10-CM

## 2016-01-13 DIAGNOSIS — I10 Essential (primary) hypertension: Secondary | ICD-10-CM | POA: Diagnosis not present

## 2016-01-13 DIAGNOSIS — I69319 Unspecified symptoms and signs involving cognitive functions following cerebral infarction: Secondary | ICD-10-CM

## 2016-01-13 NOTE — Progress Notes (Signed)
Subjective: I broke my ankle on the right    Patient ID: Derrick Butler, male    DOB: 1955-11-23, 60 y.o.   MRN: [id number]  Ankle Injury  The incident occurred 3 to 5 days ago. The incident occurred at a nursing home. The injury mechanism was a fall. The pain is present in the right ankle. The quality of the pain is described as aching. The pain is at a severity of 3/10. The pain is mild. The pain has been improving since onset. Associated symptoms include an inability to bear weight and a loss of motion. Pertinent negatives include no loss of sensation, muscle weakness, numbness or tingling. The symptoms are aggravated by weight bearing. He has tried immobilization and elevation for the symptoms. The treatment provided moderate relief.   He fell at [facility] on 01-09-16 and sustained a bimalleolar fracture of the right ankle.  He was put in observation at [facility] and discharged yesterday.  I talked to Dr. Sarajane Jews yesterday about the patient.  I have reviewed the hospital records, the x-rays and the x-ray report.  He is in a posterior splint.  He had no other injury.  He has tremors, old CVA which is stable.   Review of Systems  HENT: Positive for congestion.   Respiratory: Negative for cough and shortness of breath.   Cardiovascular: Negative for chest pain and leg swelling.  Endocrine: Positive for cold intolerance.  Musculoskeletal: Positive for joint swelling, arthralgias and gait problem.  Allergic/Immunologic: Positive for environmental allergies.  Neurological: Positive for dizziness, tremors, seizures, speech difficulty and headaches. Negative for tingling and numbness.  Psychiatric/Behavioral: The patient is nervous/anxious.    Past Medical History  Diagnosis Date  . Aortic dissection ([place])   . Hypertension   . CVA (cerebral infarction)   . Aortic dissection ([place])   . Hypertension   . CVA (cerebral infarction)   . Diabetes mellitus   . Cardiomyopathy   .  Hyperglycemia   . Bipolar 1 disorder ([place])   . Hyperkalemia   . Coronary artery disease   . Hyponatremia   . Altered mental status     with psychosis  . Dehydration   . TIA (transient ischemic attack)   . Lack of coordination   . Cardiomyopathy ([place])   . Poor historian   . Encephalopathy   . Hyperlipemia   . Psychosis   . Anemia   . Renal disorder   . Thrombocytopenia [facility])     Past Surgical History  Procedure Laterality Date  . Ascending aortic aneurysm repair    . Colonoscopy with propofol N/A 12/21/2015    Procedure: COLONOSCOPY WITH PROPOFOL;  Surgeon: Danie Binder, MD;  [facility];  Service: Endoscopy;  Laterality: N/A;  1000 - moved to 10:15 - office to notify  . Esophagogastroduodenoscopy (egd) with propofol N/A 12/21/2015    Procedure: ESOPHAGOGASTRODUODENOSCOPY (EGD) WITH PROPOFOL;  Surgeon: Danie Binder, MD;  [facility];  Service: Endoscopy;  Laterality: N/A;  . Savory dilation N/A 12/21/2015    Procedure: SAVORY DILATION;  Surgeon: Danie Binder, MD;  [facility];  Service: Endoscopy;  Laterality: N/A;  . Polypectomy  12/21/2015    Procedure: POLYPECTOMY;  Surgeon: Danie Binder, MD;  [facility];  Service: Endoscopy;;  at cecum and ascending colon; rectum  . Biopsy  12/21/2015    Procedure: BIOPSY;  Surgeon: Danie Binder, MD;  [facility];  Service: Endoscopy;;  gastric biopsy    Current Outpatient Prescriptions on File Prior to Visit  Medication Sig Dispense Refill  . aspirin 325 MG tablet Take 325 mg by mouth daily.      [name] atorvastatin (LIPITOR) 10 MG tablet Take 10 mg by mouth daily.    . baclofen (LIORESAL) 10 MG tablet Take 10 mg by mouth 3 (three) times daily.    [name] BIOTIN PO Take 30 mg by mouth daily.     . cloNIDine (CATAPRES) 0.3 MG tablet Take 0.3 mg by mouth daily.      . divalproex (DEPAKOTE SPRINKLE) 125 MG capsule Take 375 mg by mouth 3 (three) times daily.     . fluticasone (FLONASE)  50 MCG/ACT nasal spray Place 2 sprays into the nose daily. For allergies    . HYDROcodone-acetaminophen (NORCO/VICODIN) 5-325 MG tablet Take 1 tablet by mouth every 4 (four) hours as needed for moderate pain.    [name] labetalol (NORMODYNE) 200 MG tablet Take 200 mg by mouth 2 (two) times daily.      [name] LORazepam (ATIVAN) 2 MG/ML concentrated solution Inject 1 mg into the muscle every 6 (six) hours as needed for anxiety (aggressive/behavior).     . metFORMIN (GLUCOPHAGE) 500 MG tablet Take 500 mg by mouth daily.     [name] omeprazole (PRILOSEC) 20 MG capsule 1 PO WITH BREAKFAST. 90 capsule 3  . QUEtiapine (SEROQUEL) 200 MG tablet Take 200 mg by mouth 2 (two) times daily.    . risperiDONE (RISPERDAL) 1 MG tablet Take 1 tablet (1 mg total) by mouth 3 (three) times daily. (Patient taking differently: Take 1 mg by mouth 2 (two) times daily. ) 90 tablet 2  . trihexyphenidyl (ARTANE) 5 MG tablet Take 5 mg by mouth 2 (two) times daily with a meal.     No current facility-administered medications on file prior to visit.    Social History   Social History  . Marital Status: Single    Spouse Name: N/A  . Number of Children: N/A  . Years of Education: N/A   Occupational History  . Not on file.   Social History Main Topics  . Smoking status: Never Smoker   . Smokeless tobacco: Never Used  . Alcohol Use: No  . Drug Use: No  . Sexual Activity: No   Other Topics Concern  . Not on file   Social History Narrative    BP 116/64 mmHg  Pulse 66  Temp(Src) 96.8 F (36 C)  Ht 5\' 6"  (1.676 m)  Wt 182 lb (82.555 kg)  BMI 29.39 kg/m2     Objective:   Physical Exam  Constitutional: He is oriented to person, place, and time. He appears well-developed and well-nourished.  HENT:  Head: Normocephalic and atraumatic.  Eyes: Conjunctivae and EOM are normal. Pupils are equal, round, and reactive to light.  Neck: Normal range of motion. Neck supple.  Cardiovascular: Normal rate, regular rhythm and intact  distal pulses.   Pulmonary/Chest: Effort normal.  Abdominal: Soft.  Musculoskeletal: He exhibits tenderness (he has some swelling of the right ankle, ecchymosis is present, decreased ROM.  NV intact.).  Neurological: He is alert and oriented to person, place, and time. He has normal reflexes. No cranial nerve deficit. He exhibits abnormal muscle tone. Coordination abnormal.  Skin: Skin is warm and dry.  Psychiatric: He has a normal mood and affect. His behavior is normal. Judgment and thought content normal.   A short leg cast was applied.  Assessment & Plan:   Encounter Diagnoses  Name Primary?  . Bimalleolar fracture, right, closed, initial encounter Yes  . CVA, old, cognitive deficits   . Essential hypertension     Forms and orders for the nursing home were completed.  Call if any problem.  Precautions discussed.  Return in one week with x-rays in the cast.

## 2016-01-13 NOTE — Patient Instructions (Signed)
Cast precautions.  Return in one week with x-rays in the cast.

## 2016-01-20 ENCOUNTER — Ambulatory Visit (INDEPENDENT_AMBULATORY_CARE_PROVIDER_SITE_OTHER): Payer: Medicare Other

## 2016-01-20 ENCOUNTER — Ambulatory Visit (INDEPENDENT_AMBULATORY_CARE_PROVIDER_SITE_OTHER): Payer: Medicare Other | Admitting: Orthopaedic Surgery

## 2016-01-20 ENCOUNTER — Encounter: Payer: Self-pay | Admitting: Orthopaedic Surgery

## 2016-01-20 VITALS — BP 115/61 | HR 68 | Temp 96.8°F | Ht 66.0 in | Wt 182.0 lb

## 2016-01-20 DIAGNOSIS — S82891A Other fracture of right lower leg, initial encounter for closed fracture: Secondary | ICD-10-CM

## 2016-01-20 NOTE — Progress Notes (Signed)
CC:  My ankle is OK  He is a resident at [facility].  He has a short leg cast in place.  He has put some weight on the cast.  He has a nondisplaced trimalleolar fracture of the right ankle.  NV is ok.  Cast is OK.  X-rays today show change in medial malleolus positioning.  It is slight.  Encounter Diagnosis  Name Primary?  Derrick Butler Ankle fracture, right Yes  . I will have Dr. Aline Brochure review the x-rays also and see if he feels the patient might need surgery.  If not, I will see the patient back in two weeks, x-rays in the cast.  Forms for nursing home completed.

## 2016-01-26 ENCOUNTER — Ambulatory Visit (INDEPENDENT_AMBULATORY_CARE_PROVIDER_SITE_OTHER): Payer: Medicare Other | Admitting: Nurse Practitioner

## 2016-01-26 ENCOUNTER — Encounter: Payer: Self-pay | Admitting: Nurse Practitioner

## 2016-01-26 ENCOUNTER — Other Ambulatory Visit: Payer: Self-pay

## 2016-01-26 VITALS — BP 120/80 | HR 64 | Temp 97.1°F | Ht 68.0 in | Wt 174.4 lb

## 2016-01-26 DIAGNOSIS — R1310 Dysphagia, unspecified: Secondary | ICD-10-CM

## 2016-01-26 DIAGNOSIS — D126 Benign neoplasm of colon, unspecified: Secondary | ICD-10-CM | POA: Diagnosis not present

## 2016-01-26 NOTE — Patient Instructions (Addendum)
1. We will have speech therapy do an evaluation of your swallowing function and make recommendations related to diet. 2. While we are waiting for speech therapy, change diet from regular to dysphagia 3. 3. We will notify you when it is time to repeat your next colonoscopy in 3 years. 4. Return for follow-up here in 6 months.   Dysphagia Diet Level 3, Mechanically Advanced The dysphagia level 3 diet includes foods that are soft, moist, and can be chopped into 1-inch chunks. This diet is helpful for people with mild swallowing difficulties. It reduces the risk of food getting caught in the windpipe, trachea, or lungs. WHAT DO I NEED TO KNOW ABOUT THIS DIET?  You may eat foods that are soft and moist.  If you were on the dysphagia level 1 or level 2 diets, you may eat any of the foods included on those lists.  Avoid foods that are dry, hard, sticky, chewy, coarse, and crunchy. Also avoid large cuts of food.  Take small bites. Each bite should contain 1 inch or less of food.  Thicken liquids if instructed by your health care provider. Follow your health care provider's instructions on how to do this and to what consistency.  See your dietitian or speech language pathologist regularly for help with your dietary changes. WHAT FOODS CAN I EAT? Grains Moist breads without nuts or seeds. Biscuits, muffins, pancakes, and waffles well-moistened with syrup, jelly, margarine, or butter. Smooth cereals with plenty of milk to moisten them. Moist bread stuffing. Moist rice. Vegetables All cooked, soft vegetables. Shredded lettuce. Tender fried potatoes. Fruits All canned and cooked fruits. Soft, peeled fresh fruits, such as peaches, nectarines, kiwis, cantaloupe, honeydew melon, and watermelon without seeds. Soft berries, such as strawberries. Meat and Other Protein Sources Moist ground or finely diced or sliced meats. Solid, tender cuts of meat. Meatloaf. Hamburger with a bun. Sausage patty. Deli  thin-sliced lunch meat. Chicken, egg, or tuna salad sandwich. Sloppy joe. Moist fish. Eggs prepared any way. Casseroles with small chunks of meats, ground meats, or tender meats. Dairy Cheese spreads without coarse large chunks. Shredded cheese. Cheese slices. Cottage cheese. Milk at the right texture. Smooth frappes. Yogurt without nuts or coconut. Ask your health care provider whether you can have frozen desserts (such as malts or milk shakes) and thin liquids. Sweets/Desserts Soft, smooth, moist desserts. Non-chewy, smooth candy. Jam. Jelly. Honey. Preserves. Ask your health care provider whether you can have frozen desserts. Fats and Oils Butter. Oils. Margarine. Mayonnaise. Gravy. Spreads. Other All seasonings and sweeteners. All sauces without large chunks. The items listed above may not be a complete list of recommended foods or beverages. Contact your dietitian for more options. WHAT FOODS ARE NOT RECOMMENDED? Grains Coarse or dry cereals. Dry breads. Toast. Crackers. Tough, crusty breads, such French bread and baguettes. Tough, crisp fried potatoes. Potato skins. Dry bread stuffing. Granola. Popcorn. Chips. Vegetables All raw vegetables except shredded lettuce. Cooked corn. Rubbery or stiff cooked vegetables. Stringy vegetables, such as celery. Fruits Hard fruits that are difficult to chew, such as apples or pears. Stringy, high-pulp fruits, such as pineapple, papaya, or mango. Fruits with tough skins, such as grapes. Coconut. All dried fruits. Fruit leather. Fruit roll-ups. Fruit snacks. Meat and Other Protein Sources Dry or tough meats or poultry. Dry fish. Fish with bones. Peanut butter. All nuts and seeds. Dairy  Any with nuts, seeds, chocolate chips, dried fruit, coconut, or pineapple. Sweets/Desserts Dry cakes. Chewy or dry cookies. Any with nuts, seeds,  dry fruits, coconut, pineapple, or anything dry, sticky, or hard. Chewy caramel. Licorice. Taffy-type candies. Ask your health  care provider whether you can have frozen desserts. Fats and Oils Any with chunks, nuts, seeds, or pineapple. Olives. Angie Fava. Other Soups with tough or large chunks of meats, poultry, or vegetables. Corn or clam chowder. The items listed above may not be a complete list of foods and beverages to avoid. Contact your dietitian for more information.   This information is not intended to replace advice given to you by your health care provider. Make sure you discuss any questions you have with your health care provider.   Document Released: 08/21/2005 Document Revised: 09/11/2014 Document Reviewed: 08/04/2013 Elsevier Interactive Patient Education [employer].

## 2016-01-26 NOTE — Assessment & Plan Note (Signed)
Patient with dysphagia with recent endoscopy finding esophageal web status post dilation. Patient is a very difficult historian and nursing assistant accompanying the patient does not have any clinical details related to his care. It is very difficult to determine if he is still having swallowing problems due to inconsistent replies. At this point I will change him from a regular diet to dysphagia 3 diet until speech therapy can evaluate him and make further recommendations. Return for follow-up in 6 months.

## 2016-01-26 NOTE — Assessment & Plan Note (Signed)
Completed first-ever screening colonoscopy with multiple polyps found including 3 which are simple adenomas. Recommended repeat colonoscopy in 3 years. The patient is currently on the recall list.

## 2016-01-26 NOTE — Progress Notes (Signed)
Referring Provider: Rosita Fire, MD Primary Care Physician:  Alonza Bogus, MD Primary GI:  Dr. Oneida Alar  Chief Complaint  Patient presents with  . Dysphagia    HPI:   Derrick Butler is a 59 y.o. male who presents For follow-up on screening colonoscopy and dysphagia status post endoscopy with dilation. Last seen in our office 11/23/2015 at which point the patient minutes solid food dysphagia and an never had a colonoscopy before. Generally asymptomatic from a GI standpoint. He was arranged for colonoscopy and EGD with possible dilation. These procedures were both completed on 12/21/2015 on propofol due to medications. Findings on the colonoscopy included 3 mm polyp in the ascending colon, four 6-8 mm polyps in the rectum, ascending colon, and cecum. Pathology found 3 of the polyps to be simple adenomas and 2 to be hyperplastic. Recommended repeat colonoscopy in 3 years. 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 Stomach biopsy found gastritis due to aspirin. Recommend high-fiber diet, adequate hydration, continue weight loss efforts.  Patient currently resides at [facility].  Today he is accompanied by a nursing assistant who is unfamiliar with the patient. Today he states he's doing good. Appears to be in a jovial mood. Asked in multiple ways if he is having swallowing problems and it is difficult to elicit consistent response. When discussed dysphagia symptoms at last visit and if his symptoms are any better, states "I don't know I can't tell you that." Denies abdominal pain, vomiting. Difficult to elicit other responses. Facility documents that accompany patient with no mention of acute concerns. Currently on a regular diet.   Past Medical History  Diagnosis Date  . Aortic dissection ([place])   . Hypertension   . CVA (cerebral infarction)   . Aortic dissection ([place])   .  Hypertension   . CVA (cerebral infarction)   . Diabetes mellitus   . Cardiomyopathy   . Hyperglycemia   . Bipolar 1 disorder ([place])   . Hyperkalemia   . Coronary artery disease   . Hyponatremia   . Altered mental status     with psychosis  . Dehydration   . TIA (transient ischemic attack)   . Lack of coordination   . Cardiomyopathy ([place])   . Poor historian   . Encephalopathy   . Hyperlipemia   . Psychosis   . Anemia   . Renal disorder   . Thrombocytopenia [facility])     Past Surgical History  Procedure Laterality Date  . Ascending aortic aneurysm repair    . Colonoscopy with propofol N/A 12/21/2015    SLF: one 51mm polyp in the ascending colon , removed with a cold biopsy forceps. resectedand retrieved.  four 6- 8 mm polyps in the rectum , in the ascending colon and in the cedum, removed with a hot snare. resected and retrieved.  . Esophagogastroduodenoscopy (egd) with propofol N/A 12/21/2015    SLF: Web in the distal esophagus dilated. Erosive gastropathy, biopsied normal duodenal bulb and second portion of the duodenum.   Azzie Almas dilation N/A 12/21/2015    Procedure: SAVORY DILATION;  Surgeon: Danie Binder, MD;  [facility];  Service: Endoscopy;  Laterality: N/A;  . Polypectomy  12/21/2015    Procedure: POLYPECTOMY;  Surgeon: Danie Binder, MD;  [facility];  Service: Endoscopy;;  at cecum and ascending colon; rectum  . Biopsy  12/21/2015    Procedure:  BIOPSY;  Surgeon: Danie Binder, MD;  [facility];  Service: Endoscopy;;  gastric biopsy    Current Outpatient Prescriptions  Medication Sig Dispense Refill  . aspirin 325 MG tablet Take 325 mg by mouth daily.      [name] atorvastatin (LIPITOR) 10 MG tablet Take 10 mg by mouth daily.    . baclofen (LIORESAL) 10 MG tablet Take 10 mg by mouth 3 (three) times daily.    [name] BIOTIN PO Take 30 mg by mouth daily.     . cloNIDine (CATAPRES) 0.3 MG tablet Take 0.3 mg by mouth daily.      . divalproex  (DEPAKOTE SPRINKLE) 125 MG capsule Take 375 mg by mouth 3 (three) times daily.     . fluticasone (FLONASE) 50 MCG/ACT nasal spray Place 2 sprays into the nose daily. For allergies    . HYDROcodone-acetaminophen (NORCO/VICODIN) 5-325 MG tablet Take 1 tablet by mouth every 4 (four) hours as needed for moderate pain.    [name] labetalol (NORMODYNE) 200 MG tablet Take 200 mg by mouth 2 (two) times daily.      [name] LORazepam (ATIVAN) 2 MG/ML concentrated solution Inject 1 mg into the muscle every 6 (six) hours as needed for anxiety (aggressive/behavior).     . metFORMIN (GLUCOPHAGE) 500 MG tablet Take 500 mg by mouth daily.     [name] omeprazole (PRILOSEC) 20 MG capsule 1 PO WITH BREAKFAST. 90 capsule 3  . QUEtiapine (SEROQUEL) 200 MG tablet Take 200 mg by mouth 2 (two) times daily.    . risperiDONE (RISPERDAL) 1 MG tablet Take 1 tablet (1 mg total) by mouth 3 (three) times daily. (Patient taking differently: Take 1 mg by mouth 2 (two) times daily. ) 90 tablet 2  . trihexyphenidyl (ARTANE) 5 MG tablet Take 5 mg by mouth 2 (two) times daily with a meal.     No current facility-administered medications for this visit.    Allergies as of 01/26/2016  . (No Known Allergies)    Family History  Problem Relation Age of Onset  . Alzheimer's disease Mother   . Stroke Father   . Heart disease Father   . Bipolar disorder Father   . Bipolar disorder Sister   . Bipolar disorder Sister   . Colon cancer Neg Hx     Social History   Social History  . Marital Status: Single    Spouse Name: N/A  . Number of Children: N/A  . Years of Education: N/A   Social History Main Topics  . Smoking status: Never Smoker   . Smokeless tobacco: Never Used  . Alcohol Use: No  . Drug Use: No  . Sexual Activity: No   Other Topics Concern  . None   Social History Narrative    Review of Systems: 10-point ROS negative except as per HPI.   Physical Exam: BP 120/80 mmHg  Pulse 64  Temp(Src) 97.1 F (36.2 C)  Ht 5\' 8"   (1.727 m)  Wt 174 lb 6.4 oz (79.107 kg)  BMI 26.52 kg/m2 General:   Alert and oriented. Pleasant and cooperative. Well-nourished and well-developed. Difficult to understand speech. Ears:  Normal auditory acuity. Cardiovascular:  S1, S2 present without murmurs appreciated. Extremities without clubbing or edema. Respiratory:  Clear to auscultation bilaterally. No wheezes, rales, or rhonchi. No distress.  Gastrointestinal:  +BS, soft, non-tender and non-distended. No HSM noted. No guarding or rebound. Rectal:  Deferred  Musculoskalatal:  In a wheelchair with case on RLE s/p fracture. Neurologic:  Alert and oriented x4;  grossly normal neurologically. Psych:  Alert and cooperative. Heme/Lymph/Immune: No excessive bruising noted.    01/26/2016 10:51 AM   Disclaimer: This note was dictated with voice recognition software. Similar sounding words can inadvertently be transcribed and may not be corrected upon review.

## 2016-01-27 NOTE — Progress Notes (Signed)
cc'ed to pcp °

## 2016-02-03 ENCOUNTER — Encounter: Payer: Self-pay | Admitting: Orthopaedic Surgery

## 2016-02-03 ENCOUNTER — Ambulatory Visit (INDEPENDENT_AMBULATORY_CARE_PROVIDER_SITE_OTHER): Payer: Medicare Other

## 2016-02-03 ENCOUNTER — Ambulatory Visit: Payer: Medicare Other | Admitting: Orthopaedic Surgery

## 2016-02-03 VITALS — BP 101/64 | HR 62 | Temp 97.2°F | Ht 68.0 in | Wt 174.0 lb

## 2016-02-03 DIAGNOSIS — S82891A Other fracture of right lower leg, initial encounter for closed fracture: Secondary | ICD-10-CM

## 2016-02-03 DIAGNOSIS — S82841D Displaced bimalleolar fracture of right lower leg, subsequent encounter for closed fracture with routine healing: Secondary | ICD-10-CM

## 2016-02-03 NOTE — Progress Notes (Signed)
CC:  My ankle does not hurt  He is at [facility].  He has a short leg cast on the right.  He is doing well.  NV intact.  Cast OK.  X-rays were done and recorded separately.  Encounter Diagnoses  Name Primary?  . Bimalleolar fracture, right, closed, with routine healing, subsequent encounter   . Ankle fracture, right Yes    Return in two weeks.  X-rays out of the cast then.  Forms for nursing home completed.  Call if any problem.  Electronically Signed Sanjuana Kava, MD 6/1/201710:26 AM

## 2016-02-03 NOTE — Patient Instructions (Signed)
X-ray right ankle out of cast on return.

## 2016-02-17 ENCOUNTER — Encounter: Payer: Self-pay | Admitting: Orthopaedic Surgery

## 2016-02-17 ENCOUNTER — Ambulatory Visit (INDEPENDENT_AMBULATORY_CARE_PROVIDER_SITE_OTHER): Payer: Medicare Other

## 2016-02-17 ENCOUNTER — Ambulatory Visit (INDEPENDENT_AMBULATORY_CARE_PROVIDER_SITE_OTHER): Payer: Medicare Other | Admitting: Orthopaedic Surgery

## 2016-02-17 VITALS — BP 107/68 | HR 55 | Temp 97.5°F

## 2016-02-17 DIAGNOSIS — S82841D Displaced bimalleolar fracture of right lower leg, subsequent encounter for closed fracture with routine healing: Secondary | ICD-10-CM

## 2016-02-17 DIAGNOSIS — I69319 Unspecified symptoms and signs involving cognitive functions following cerebral infarction: Secondary | ICD-10-CM

## 2016-02-17 DIAGNOSIS — I10 Essential (primary) hypertension: Secondary | ICD-10-CM

## 2016-02-17 NOTE — Progress Notes (Signed)
CC: it does not hurt  He has bimalleolar fracture of the right ankle.  He has been in a short leg cast. He is a resident at [facility].  He has no new trauma.  Skin is OK. Cast was removed for x-rays.  NV intact.  X-rays recorded separately  Encounter Diagnoses  Name Primary?  . Bimalleolar fracture, right, closed, with routine healing, subsequent encounter Yes  . CVA, old, cognitive deficits   . Essential hypertension    He will need a new short leg cast.  It was applied.  I will see him in one month with x-rays out of the cast on return.  No weight bearing.  Forms for nursing home completed.  Electronically Signed Sanjuana Kava, MD 6/15/201710:45 AM

## 2016-03-21 ENCOUNTER — Ambulatory Visit (INDEPENDENT_AMBULATORY_CARE_PROVIDER_SITE_OTHER): Payer: Medicare Other

## 2016-03-21 ENCOUNTER — Ambulatory Visit (INDEPENDENT_AMBULATORY_CARE_PROVIDER_SITE_OTHER): Payer: Medicare Other | Admitting: Orthopaedic Surgery

## 2016-03-21 ENCOUNTER — Encounter: Payer: Self-pay | Admitting: Orthopaedic Surgery

## 2016-03-21 VITALS — BP 132/73 | HR 64 | Temp 97.7°F

## 2016-03-21 DIAGNOSIS — S82841D Displaced bimalleolar fracture of right lower leg, subsequent encounter for closed fracture with routine healing: Secondary | ICD-10-CM

## 2016-03-21 DIAGNOSIS — S82891A Other fracture of right lower leg, initial encounter for closed fracture: Secondary | ICD-10-CM

## 2016-03-21 NOTE — Progress Notes (Signed)
CC:  My ankle does not hurt  He has done well in the short leg cast.  He has normal NV status and motion is good first time out of cast.  X-rays were done and reported separately.  Encounter Diagnoses  Name Primary?  [name] Ankle fracture, right Yes  . Bimalleolar fracture, right, closed, with routine healing, subsequent encounter     He will need another cast for three weeks.  Return in three weeks.    X-rays out of the cast.  Precautions given.  Call if any problem.  Electronically Signed Sanjuana Kava, MD 7/18/20179:46 AM

## 2016-04-11 ENCOUNTER — Ambulatory Visit (INDEPENDENT_AMBULATORY_CARE_PROVIDER_SITE_OTHER): Payer: Medicare Other

## 2016-04-11 ENCOUNTER — Encounter: Payer: Self-pay | Admitting: Orthopaedic Surgery

## 2016-04-11 ENCOUNTER — Ambulatory Visit: Payer: Medicare Other | Admitting: Orthopaedic Surgery

## 2016-04-11 VITALS — Temp 97.7°F

## 2016-04-11 DIAGNOSIS — S82841D Displaced bimalleolar fracture of right lower leg, subsequent encounter for closed fracture with routine healing: Secondary | ICD-10-CM | POA: Diagnosis not present

## 2016-04-11 NOTE — Progress Notes (Signed)
CC:  Can I leave my cast off?  He has done well with the right ankle.  Cast was removed.  He has a small blister size of a quarter of the posterior heel near Achilles insertion.  It is not draining.  NV intact. ROM of ankle good first time out of the cast.  Encounter Diagnosis  Name Primary?  . Bimalleolar fracture, right, closed, with routine healing, subsequent encounter Yes   Begin PT at nursing home and weight bear as tolerated and wound care to the blister area.  Cast left off.  Return in two weeks.  X-rays of the right ankle on return.  Call if any problem.  Electronically Signed Sanjuana Kava, MD 8/8/201710:35 AM

## 2016-04-25 ENCOUNTER — Encounter: Payer: Self-pay | Admitting: Orthopaedic Surgery

## 2016-04-25 ENCOUNTER — Ambulatory Visit (INDEPENDENT_AMBULATORY_CARE_PROVIDER_SITE_OTHER): Payer: Medicare Other

## 2016-04-25 ENCOUNTER — Ambulatory Visit: Payer: Medicare Other | Admitting: Orthopaedic Surgery

## 2016-04-25 VITALS — BP 126/82 | HR 67 | Temp 97.5°F

## 2016-04-25 DIAGNOSIS — S82841D Displaced bimalleolar fracture of right lower leg, subsequent encounter for closed fracture with routine healing: Secondary | ICD-10-CM | POA: Diagnosis not present

## 2016-04-25 NOTE — Progress Notes (Signed)
CC:  My ankle does not hurt  He is doing well with the right ankle. He has no swelling, no pain.  X-rays show fracture of ankle healed on the right.  NV intact.  ROM full.  Encounter Diagnosis  Name Primary?  . Bimalleolar ankle fracture, right, closed, with routine healing, subsequent encounter Yes    Discharge.  Call if any problem.  Electronically Signed Sanjuana Kava, MD 8/22/20179:23 AM

## 2016-07-31 ENCOUNTER — Encounter: Payer: Self-pay | Admitting: Nurse Practitioner

## 2016-07-31 ENCOUNTER — Ambulatory Visit (INDEPENDENT_AMBULATORY_CARE_PROVIDER_SITE_OTHER): Payer: Medicare Other | Admitting: Nurse Practitioner

## 2016-07-31 VITALS — BP 110/78 | HR 64 | Temp 98.2°F | Ht 68.0 in | Wt 180.4 lb

## 2016-07-31 DIAGNOSIS — R1310 Dysphagia, unspecified: Secondary | ICD-10-CM | POA: Diagnosis not present

## 2016-07-31 NOTE — Assessment & Plan Note (Addendum)
The patient recently had dilation of an esophageal web earlier this year. Began having recurrent dysphagia with solid food and pills about one week ago. Food is able to pass generally with concerted effort with swallowing. I will make sure that the diet is corrected to facility by writing an order for continued dysphagia 3 diet. At this point I will do a barium pill esophagram to check for abnormalities versus dysmotility. Return for follow-up in 6 weeks.   He may ultimately benefit from speech therapy evaluation. No speech therapy notes found in the system or facility record today.

## 2016-07-31 NOTE — Progress Notes (Signed)
cc'd to pcp 

## 2016-07-31 NOTE — Patient Instructions (Signed)
1. We will schedule your swallowing test for you. 2. We will let the facility no when the swallowing test is said they can make sure you get there. 3. Return for follow-up in 4-6 weeks.

## 2016-07-31 NOTE — Progress Notes (Signed)
Referring Provider: Sinda Du, MD Primary Care Physician:  Alonza Bogus, MD Primary GI:  Dr. Oneida Alar  Chief Complaint  Patient presents with  . Dysphagia    HPI:   Derrick Butler is a 60 y.o. male who presents For follow-up on dysphagia. The patient was last seen in our office 01/26/2016 for the same. Currently resides at [facility]. At his last visit he was accompanied by nursing assistant who is unfamiliar with the patient. The patient stated he was doing good, appeared in a jovial mood, denied overt dysphagia symptoms. Currently on a regular diet and facility documents with no mention of acute concerns. 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 He was changed to a dysphagia 3 diet until speech therapy could evaluate make further recommendations. Recommended 6 month follow-up.  Colonoscopy is up-to-date and is on recall for 2020.  Facility records reviewed, is currently on PPI. Previous MD visit dated 06/16/16 and no mention of recurrent/worsening dysphagia. No nursing notes included to indicate worsening dysphagia. He also appears to currently have 2 active dietary orders: Regular diet, mechanical soft with thin consistency for dysphagia as well as regular diet, regular, thin consistency. Both are active without end date.  Today he is accompanied by facility staff who has no clinical knowledge of the patient. Today he states he started having dysphagia again a week ago. States food gets stuck in his throat when he tries to swallow. Passes with "neck stretching" and concerted efforts to swallow. Also pill dysphagia. Denies GERD symptoms, stomach pain, N/V, has regular bowel movements. Denies hematochezia. Denies chest pain, dyspnea, dizziness, lightheadedness, syncope, near syncope. Denies any other upper or lower GI symptoms.  Past Medical History:  Diagnosis  Date  . Altered mental status    with psychosis  . Anemia   . Aortic dissection ([place])   . Aortic dissection ([place])   . Bipolar 1 disorder ([place])   . Cardiomyopathy   . Cardiomyopathy ([place])   . Coronary artery disease   . CVA (cerebral infarction)   . CVA (cerebral infarction)   . Dehydration   . Diabetes mellitus   . Encephalopathy   . Hyperglycemia   . Hyperkalemia   . Hyperlipemia   . Hypertension   . Hypertension   . Hyponatremia   . Lack of coordination   . Poor historian   . Psychosis   . Renal disorder   . Thrombocytopenia ([place])   . TIA (transient ischemic attack)     Past Surgical History:  Procedure Laterality Date  . ASCENDING AORTIC ANEURYSM REPAIR    . BIOPSY  12/21/2015   Procedure: BIOPSY;  Surgeon: Danie Binder, MD;  [facility];  Service: Endoscopy;;  gastric biopsy  . COLONOSCOPY WITH PROPOFOL N/A 12/21/2015   SLF: one 59mm polyp in the ascending colon , removed with a cold biopsy forceps. resectedand retrieved.  four 6- 8 mm polyps in the rectum , in the ascending colon and in the cedum, removed with a hot snare. resected and retrieved.  . ESOPHAGOGASTRODUODENOSCOPY (EGD) WITH PROPOFOL N/A 12/21/2015   SLF: Web in the distal esophagus dilated. Erosive gastropathy, biopsied normal duodenal bulb and second portion of the duodenum.   [name] POLYPECTOMY  12/21/2015   Procedure: POLYPECTOMY;  Surgeon: Danie Binder, MD;  [facility];  Service: Endoscopy;;  at cecum and ascending colon; rectum  .  SAVORY DILATION N/A 12/21/2015   Procedure: SAVORY DILATION;  Surgeon: Danie Binder, MD;  [facility];  Service: Endoscopy;  Laterality: N/A;    Current Outpatient Prescriptions  Medication Sig Dispense Refill  . aspirin 325 MG tablet Take 325 mg by mouth daily.      [name] atorvastatin (LIPITOR) 10 MG tablet Take 10 mg by mouth daily.    . baclofen (LIORESAL) 10 MG tablet Take 10 mg by mouth 3 (three) times daily.    [name] BIOTIN PO Take 30 mg  by mouth daily.     . cloNIDine (CATAPRES) 0.3 MG tablet Take 0.3 mg by mouth daily.      . divalproex (DEPAKOTE SPRINKLE) 125 MG capsule Take 375 mg by mouth 3 (three) times daily.     . fluticasone (FLONASE) 50 MCG/ACT nasal spray Place 2 sprays into the nose daily. For allergies    . labetalol (NORMODYNE) 200 MG tablet Take 200 mg by mouth 2 (two) times daily.      . metFORMIN (GLUCOPHAGE) 500 MG tablet Take 500 mg by mouth daily.     [name] omeprazole (PRILOSEC) 20 MG capsule 1 PO WITH BREAKFAST. 90 capsule 3  . QUEtiapine (SEROQUEL) 200 MG tablet Take 200 mg by mouth 2 (two) times daily.    . risperiDONE (RISPERDAL) 1 MG tablet Take 1 tablet (1 mg total) by mouth 3 (three) times daily. (Patient taking differently: Take 1 mg by mouth 2 (two) times daily. ) 90 tablet 2  . trihexyphenidyl (ARTANE) 5 MG tablet Take 5 mg by mouth 2 (two) times daily with a meal.     No current facility-administered medications for this visit.     Allergies as of 07/31/2016  . (No Known Allergies)    Family History  Problem Relation Age of Onset  . Alzheimer's disease Mother   . Stroke Father   . Heart disease Father   . Bipolar disorder Father   . Bipolar disorder Sister   . Bipolar disorder Sister   . Colon cancer Neg Hx     Social History   Social History  . Marital status: Single    Spouse name: N/A  . Number of children: N/A  . Years of education: N/A   Social History Main Topics  . Smoking status: Never Smoker  . Smokeless tobacco: Never Used  . Alcohol use No  . Drug use: No  . Sexual activity: No   Other Topics Concern  . None   Social History Narrative  . None    Review of Systems: Complete ROS negative except as per HPI.   Physical Exam: BP 110/78   Pulse 64   Temp 98.2 F (36.8 C) (Oral)   Ht 5\' 8"  (1.727 m)   Wt 180 lb 6.4 oz (81.8 kg)   BMI 27.43 kg/m  General:   Alert and oriented. Pleasant and cooperative. Well-nourished and well-developed. Jovial mood. Ears:   Normal auditory acuity. Cardiovascular:  S1, S2 present without murmurs appreciated. Extremities without clubbing or edema. Respiratory:  Clear to auscultation bilaterally. No wheezes, rales, or rhonchi. No distress.  Gastrointestinal:  +BS, soft, non-tender and non-distended. No HSM noted. No guarding or rebound. No masses appreciated.  Rectal:  Deferred  Musculoskalatal:  Symmetrical without gross deformities. Neurologic:  Alert and oriented x4;  grossly normal neurologically. Psych:  Alert and cooperative. Normal mood and affect. Heme/Lymph/Immune: No excessive bruising noted.    07/31/2016 11:13 AM   Disclaimer: This note was  dictated with voice recognition software. Similar sounding words can inadvertently be transcribed and may not be corrected upon review.

## 2016-08-04 ENCOUNTER — Ambulatory Visit (HOSPITAL_COMMUNITY)
Admission: RE | Admit: 2016-08-04 | Discharge: 2016-08-04 | Disposition: A | Discharge status: Home / Self Care | Payer: Medicare Other | Source: Ambulatory Visit | Attending: Nurse Practitioner | Admitting: Nurse Practitioner

## 2016-08-04 DIAGNOSIS — K5790 Diverticulosis of intestine, part unspecified, without perforation or abscess without bleeding: Secondary | ICD-10-CM | POA: Insufficient documentation

## 2016-08-04 DIAGNOSIS — R1310 Dysphagia, unspecified: Secondary | ICD-10-CM

## 2016-08-04 DIAGNOSIS — K224 Dyskinesia of esophagus: Secondary | ICD-10-CM | POA: Diagnosis not present

## 2016-08-23 NOTE — Progress Notes (Signed)
PT's nurse, Vicente Males is aware and I am faxing a print out of this information to her for their records. Faxed to : [phone].

## 2016-09-13 ENCOUNTER — Encounter: Payer: Self-pay | Admitting: Nurse Practitioner

## 2016-09-13 ENCOUNTER — Ambulatory Visit (INDEPENDENT_AMBULATORY_CARE_PROVIDER_SITE_OTHER): Payer: Medicare Other | Admitting: Nurse Practitioner

## 2016-09-13 VITALS — BP 115/75 | HR 85 | Temp 97.8°F | Ht 68.0 in | Wt 180.2 lb

## 2016-09-13 DIAGNOSIS — R1310 Dysphagia, unspecified: Secondary | ICD-10-CM

## 2016-09-13 NOTE — Progress Notes (Signed)
Referring Provider: Sinda Du, MD Primary Care Physician:  Alonza Bogus, MD Primary GI:  Dr. Oneida Alar  Chief Complaint  Patient presents with  . Dysphagia    HPI:   Derrick Butler is a 61 y.o. male who presents on follow-up for dysphasia. The patient was last seen in our office 07/31/2016 at which point it was noted that facility records were inconsistent related to diet order. Colonoscopy up-to-date, on recall for 2020. He was previously recommended to undergo evaluation by speech therapy. At his last visit he noted recurrent dysphagia previous week which passes with "neck stretching" and concerted efforts. Also admitted pill dysphagia. Denies GERD symptoms or other GI symptoms. Was on a PPI at that time. Nursing notes and records in the facility were reviewed and no indications of worsening dysphagia were found. Recently underwent dilation of esophageal web. He was referred for barium pill esophagram for further evaluation.  BPE was completed 08/04/2016 which found mild esophageal dysmotility, laryngeal penetration without aspiration. Specifically normal esophageal distention, no esophageal mass or stricture, barium tablet passed from oral cavity the stomach without delay.  Facility records from [facility] were reviewed prior to seeing the patient today. Nurse practitioner visit did not note any dysphagia although was a focused visit for allergic rhinitis. His diet is mechanical soft for dysphasia, active order for speech therapy evaluation and treatment ordered 08/23/2016. He is on omeprazole 20 mg daily.  The patient is unable to give a detailed history do to dementia/altered mental status likely related to past CVA, TIA, and subsequent encephalopathy. He is accompanied by staff who has some knowledge of him. However, he is able to answer basic questions. Today he states he's doing fair. Still having dysphagia, has to stretch his neck out to swallow (and demonstrated  this to me.) Staff states his food is chopped and mechanically altered and she has not noted any choking or coughing while eating. He states his foods are made dense to be easier to swallowing. Denies "heartburn." Denies hematochezia or dark stools. Denies abdominal pain, N/V. States he had a virus which caused him to be sick, cannot remember when. Denies chest pain, dyspnea, dizziness, lightheadedness, syncope, near syncope. Denies any other upper or lower GI symptoms.  Past Medical History:  Diagnosis Date  . Altered mental status    with psychosis  . Anemia   . Aortic dissection ([place])   . Aortic dissection ([place])   . Bipolar 1 disorder ([place])   . Cardiomyopathy   . Cardiomyopathy ([place])   . Coronary artery disease   . CVA (cerebral infarction)   . CVA (cerebral infarction)   . Dehydration   . Diabetes mellitus   . Encephalopathy   . Hyperglycemia   . Hyperkalemia   . Hyperlipemia   . Hypertension   . Hypertension   . Hyponatremia   . Lack of coordination   . Poor historian   . Psychosis   . Renal disorder   . Thrombocytopenia ([place])   . TIA (transient ischemic attack)     Past Surgical History:  Procedure Laterality Date  . ASCENDING AORTIC ANEURYSM REPAIR    . BIOPSY  12/21/2015   Procedure: BIOPSY;  Surgeon: Danie Binder, MD;  [facility];  Service: Endoscopy;;  gastric biopsy  . COLONOSCOPY WITH PROPOFOL N/A 12/21/2015   SLF: one 11mm polyp in the ascending colon , removed with a cold biopsy forceps. resectedand retrieved.  four 6- 8 mm polyps in the  rectum , in the ascending colon and in the cedum, removed with a hot snare. resected and retrieved.  . ESOPHAGOGASTRODUODENOSCOPY (EGD) WITH PROPOFOL N/A 12/21/2015   SLF: Web in the distal esophagus dilated. Erosive gastropathy, biopsied normal duodenal bulb and second portion of the duodenum.   [name] POLYPECTOMY  12/21/2015   Procedure: POLYPECTOMY;  Surgeon: Danie Binder, MD;  [facility];  Service:  Endoscopy;;  at cecum and ascending colon; rectum  . SAVORY DILATION N/A 12/21/2015   Procedure: SAVORY DILATION;  Surgeon: Danie Binder, MD;  [facility];  Service: Endoscopy;  Laterality: N/A;    Current Outpatient Prescriptions  Medication Sig Dispense Refill  . aspirin 325 MG tablet Take 325 mg by mouth daily.      [name] atorvastatin (LIPITOR) 10 MG tablet Take 10 mg by mouth daily.    . baclofen (LIORESAL) 10 MG tablet Take 10 mg by mouth 3 (three) times daily.    [name] BIOTIN PO Take 30 mg by mouth daily.     . cloNIDine (CATAPRES) 0.3 MG tablet Take 0.3 mg by mouth daily.      . divalproex (DEPAKOTE SPRINKLE) 125 MG capsule Take 375 mg by mouth 3 (three) times daily.     . fluticasone (FLONASE) 50 MCG/ACT nasal spray Place 2 sprays into the nose daily. For allergies    . labetalol (NORMODYNE) 200 MG tablet Take 200 mg by mouth 2 (two) times daily.      . metFORMIN (GLUCOPHAGE) 500 MG tablet Take 500 mg by mouth daily.     [name] omeprazole (PRILOSEC) 20 MG capsule 1 PO WITH BREAKFAST. 90 capsule 3  . QUEtiapine (SEROQUEL) 200 MG tablet Take 200 mg by mouth 2 (two) times daily.    . risperiDONE (RISPERDAL) 1 MG tablet Take 1 tablet (1 mg total) by mouth 3 (three) times daily. (Patient taking differently: Take 1 mg by mouth 2 (two) times daily. ) 90 tablet 2  . trihexyphenidyl (ARTANE) 5 MG tablet Take 5 mg by mouth 2 (two) times daily with a meal.     No current facility-administered medications for this visit.     Allergies as of 09/13/2016  . (No Known Allergies)    Family History  Problem Relation Age of Onset  . Alzheimer's disease Mother   . Stroke Father   . Heart disease Father   . Bipolar disorder Father   . Bipolar disorder Sister   . Bipolar disorder Sister   . Colon cancer Neg Hx     Social History   Social History  . Marital status: Single    Spouse name: N/A  . Number of children: N/A  . Years of education: N/A   Social History Main Topics  . Smoking  status: Never Smoker  . Smokeless tobacco: Never Used  . Alcohol use No  . Drug use: No  . Sexual activity: No   Other Topics Concern  . None   Social History Narrative  . None    Review of Systems: Complete ROS negative except as per HPI.   Physical Exam: BP 115/75   Pulse 85   Temp 97.8 F (36.6 C) (Oral)   Ht 5\' 8"  (1.727 m)   Wt 180 lb 3.2 oz (81.7 kg)   BMI 27.40 kg/m  General:   Alert and oriented. Pleasant and cooperative. Well-nourished and well-developed.  Ears:  Normal auditory acuity. Cardiovascular:  S1, S2 present without murmurs appreciated. Extremities without clubbing or  edema. Respiratory:  Clear to auscultation bilaterally. No wheezes, rales, or rhonchi. No distress.  Gastrointestinal:  +BS, soft, non-tender and non-distended. No HSM noted. No guarding or rebound. No masses appreciated.  Rectal:  Deferred  Musculoskalatal:  Symmetrical without gross deformities. Neurologic:  Alert;  grossly normal neurologically, some altered interaction and mental status Psych:  Alert and cooperative. Normal/happy mood and affect. Heme/Lymph/Immune: No excessive bruising noted.    09/13/2016 9:31 AM   Disclaimer: This note was dictated with voice recognition software. Similar sounding words can inadvertently be transcribed and may not be corrected upon review.

## 2016-09-13 NOTE — Patient Instructions (Signed)
1. Continue mechanical soft diet. 2. Past speech therapy begin working with the patient as soon as possible. 3. Diet recommendations per speech language pathology/speech therapy. 4. Return for follow-up as needed. 5. I have enclosed a copy of his barium pill esophagram results for your records.

## 2016-09-13 NOTE — Assessment & Plan Note (Signed)
The patient notes continued dysphagia. He does have a history of TIA and stroke with subsequent encephalopathy. He is recently had an upper endoscopy with dilation. At his last visit we arrange for barium pill esophagram which found no mass or stricture, noted esophageal dysmotility. At this point there is not much to do for his symptoms and this is likely his baseline due to dysmotility. There is an order at the facility for speech therapy evaluation and treat, although the staff with him is not sure if it seen him yet. The patient states he does not remember being seen by them although, again, he has encephalopathy and is limited in his recall. I will indicate to the facility but the sooner speech therapy can start working with him the better. Return for follow-up as needed.

## 2016-09-13 NOTE — Progress Notes (Signed)
cc'ed to pcp °

## 2016-09-19 ENCOUNTER — Telehealth: Payer: Self-pay | Admitting: Gastroenterology

## 2016-09-19 NOTE — Telephone Encounter (Signed)
I spoke with Estill Bamberg at [facility] and patient's pcp is Dr Vernell Morgans at the [facility]

## 2016-09-19 NOTE — Telephone Encounter (Signed)
I received dictated progress notes from the courier this morning with a note from Dr Luan Pulling saying he is not the PCP of this patient. I laid the papers on your chair.

## 2016-09-19 NOTE — Telephone Encounter (Signed)
Please call pt and ask him who his pcp is.

## 2017-01-12 IMAGING — CT CT CERVICAL SPINE W/O CM
3 of 5 series · 11 of 33 positions shown, 13 images · non-contrast
Comparison: CT head 12/18/2015

CLINICAL DATA: Patient fell from bed. Altered mental status. Unable
to cooperate and combative.

EXAM:
CT HEAD WITHOUT CONTRAST
CT CERVICAL SPINE WITHOUT CONTRAST
TECHNIQUE: Multidetector CT imaging of the head and cervical spine was
performed following the standard protocol without intravenous
contrast. Multiplanar CT image reconstructions of the cervical spine
were also generated.

[Series 5: cervical st 2.0 b31s · axial · 0.30mm/px · z∈[-76,+42]mm · 3 of 99 slices shown, 4 images]
[im 20/99  soft-tissue]
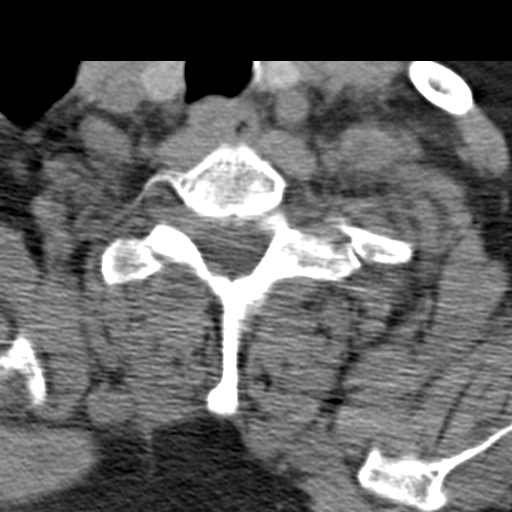
[im 20/99  bone]
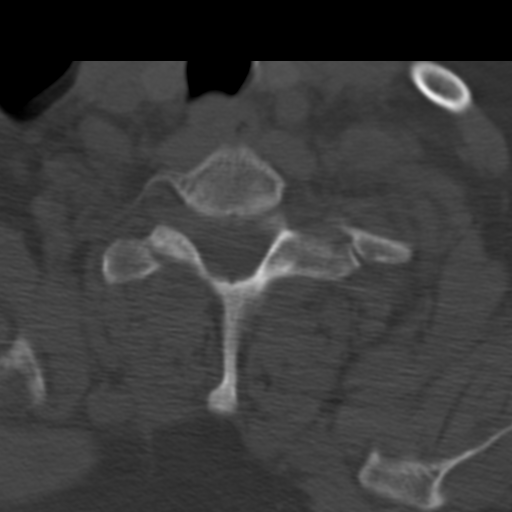
[im 59/99  bone]
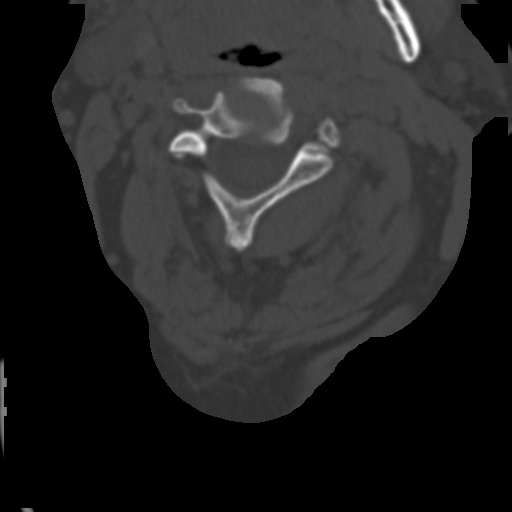
[im 79/99  bone]
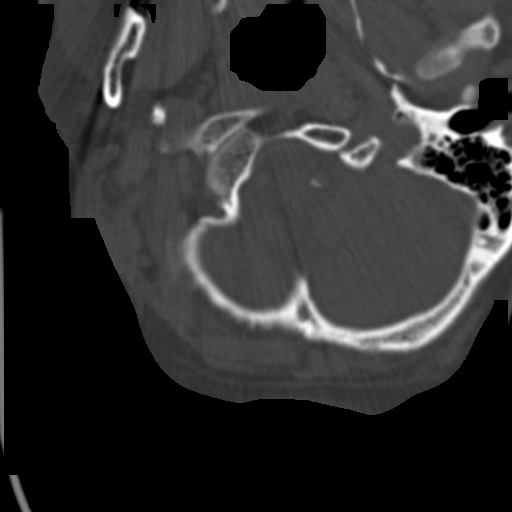

[Series 7: sagittal bone 2.0 · sagittal · 0.26mm/px · 5 of 60 slices shown, 6 images]
[im 20/60  bone]
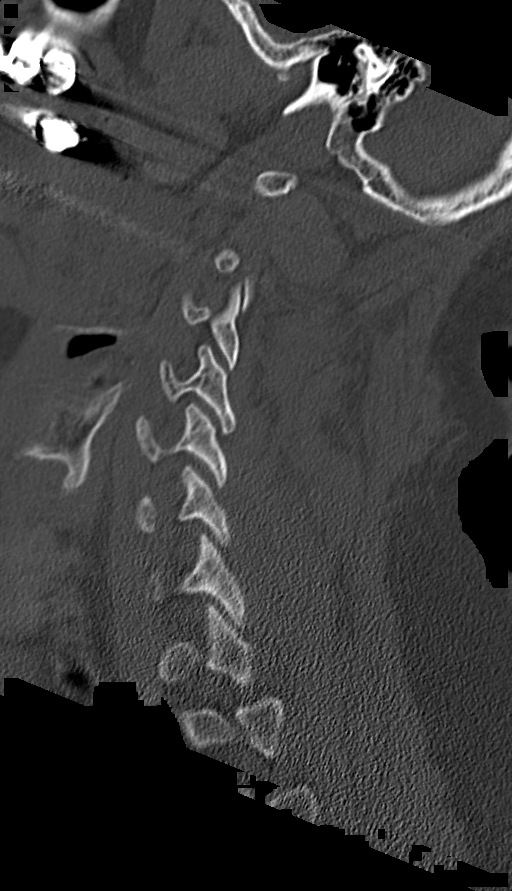
[im 25/60  bone]
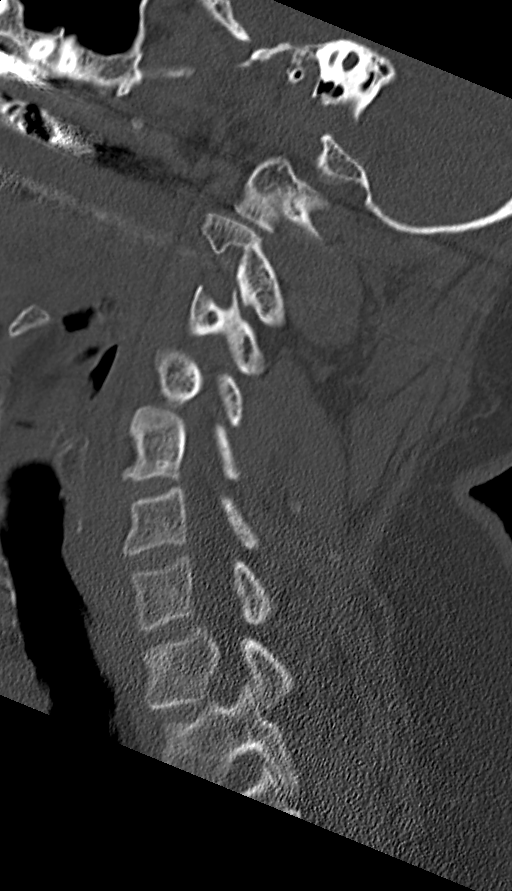
[im 30/60  soft-tissue]
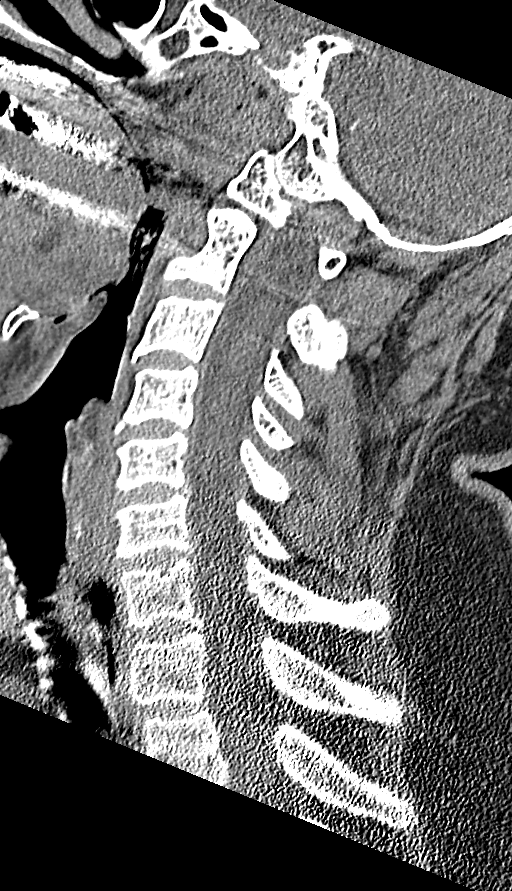
[im 30/60  bone]
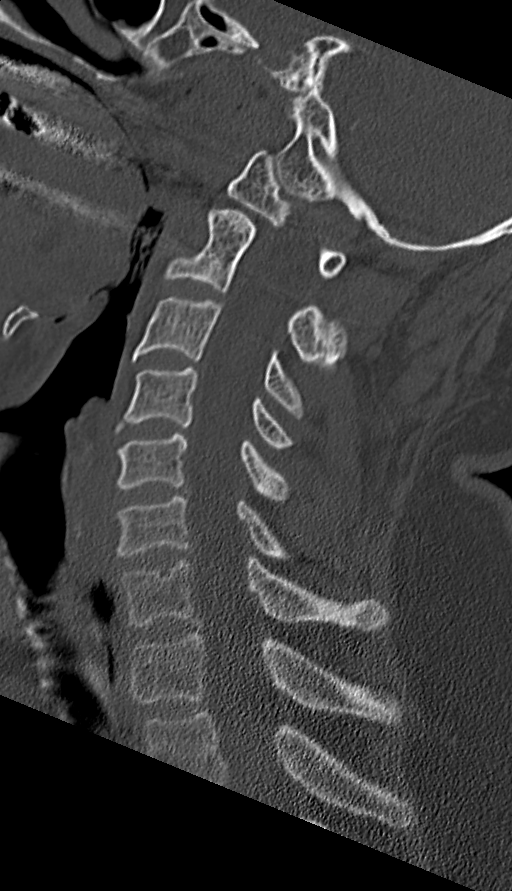
[im 35/60  bone]
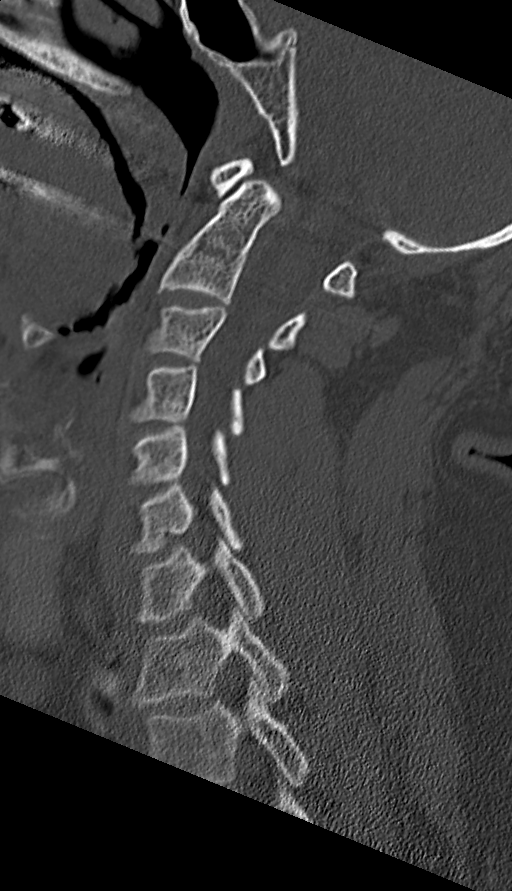
[im 40/60  bone]
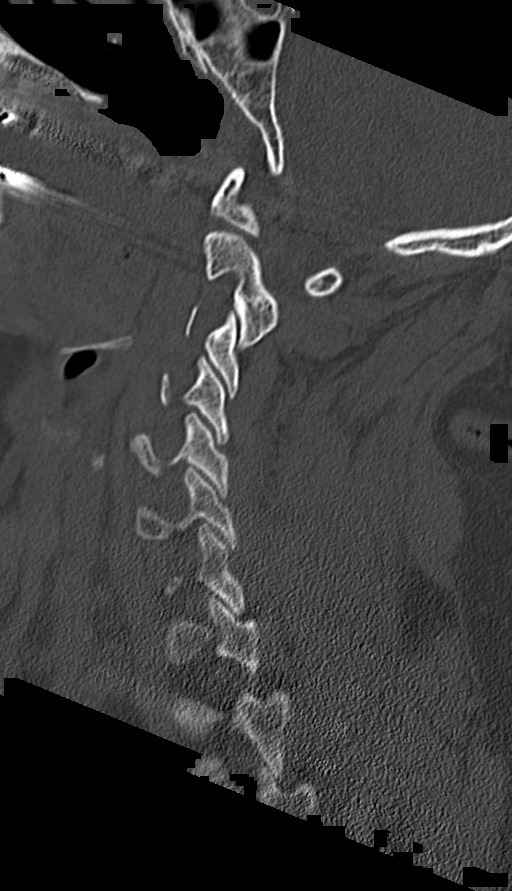

[Series 8: coronal bone 2.0 · coronal · 0.25mm/px · 3 of 61 slices shown]
[im 13/61  bone]
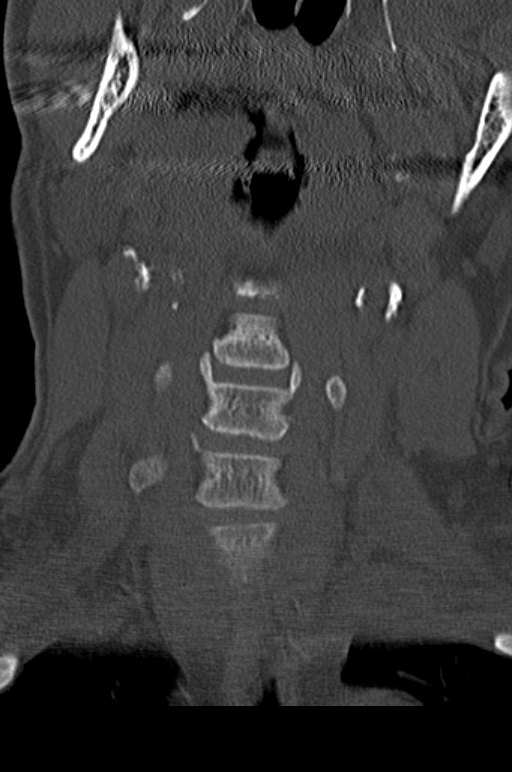
[im 25/61  bone]
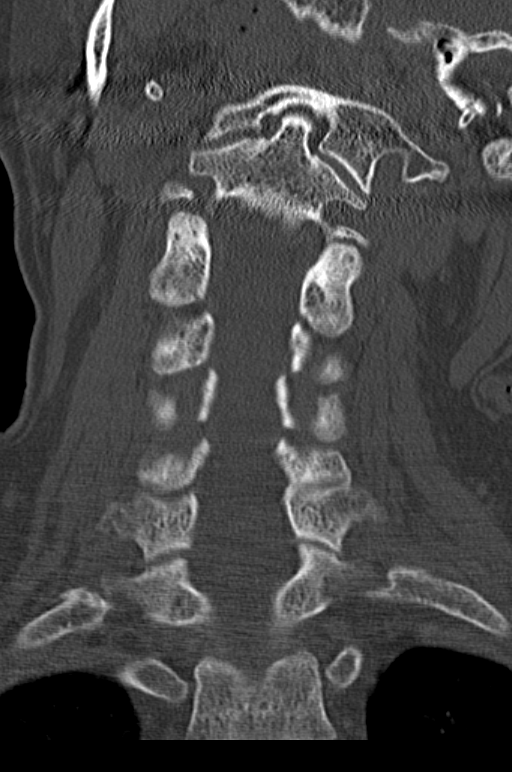
[im 37/61  bone]
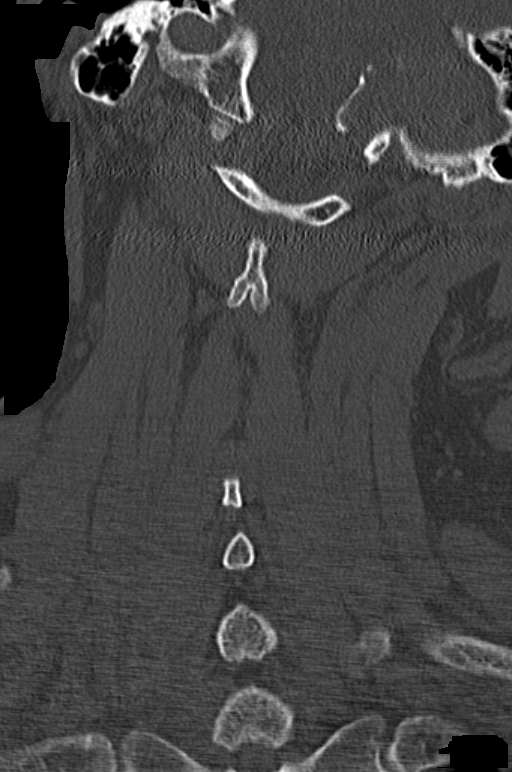

[11 of 33 positions shown; findings below may reference images not displayed]

FINDINGS: CT HEAD FINDINGS

Diffuse cerebral atrophy. Ventricular dilatation consistent with
central atrophy. Low-attenuation changes in the deep white matter
consistent with small vessel ischemia. Old lacunar infarct in the
left basal ganglia. Old area of encephalomalacia involving the right
occipital, parietal, and temporal regions with associated volume
loss causing asymmetric dilatation of the right lateral ventricle.
This is consistent with old infarct. No change since previous study.
No mass effect or midline shift. No abnormal extra-axial fluid
collections. Gray-white matter junctions are distinct. Basal
cisterns are not effaced. No evidence of acute intracranial
hemorrhage. No depressed skull fractures. Mucosal thickening in the
paranasal sinuses. Mastoid air cells are not opacified. Vascular
calcifications.

CT CERVICAL SPINE FINDINGS

Normal alignment of the cervical spine. No vertebral compression
deformities. Mild endplate hypertrophic changes. Intervertebral disc
space heights are mostly preserved. No prevertebral soft tissue
swelling. No focal bone lesion or bone destruction. Bone cortex and
trabecular architecture appears intact. C1-2 articulation appears
intact. The head is tilted towards the left. This could be due to
positioning or may indicate muscle spasm. Soft tissues are
unremarkable. Vascular calcifications.

No acute intracranial abnormalities. Chronic atrophy and small
vessel ischemic changes. Old infarct in the right temporal parietal
occipital region.

Normal alignment of the cervical spine. Mild degenerative changes.
No acute displaced fractures identified.

## 2017-04-13 ENCOUNTER — Emergency Department (HOSPITAL_COMMUNITY): Payer: Medicare Other

## 2017-04-13 ENCOUNTER — Encounter (HOSPITAL_COMMUNITY): Payer: Self-pay

## 2017-04-13 ENCOUNTER — Emergency Department (HOSPITAL_COMMUNITY)
Admission: EM | Admit: 2017-04-13 | Discharge: 2017-04-13 | Disposition: A | Discharge status: Home / Self Care | Payer: Medicare Other | Attending: Emergency Medicine | Admitting: Emergency Medicine

## 2017-04-13 DIAGNOSIS — Z79899 Other long term (current) drug therapy: Secondary | ICD-10-CM | POA: Insufficient documentation

## 2017-04-13 DIAGNOSIS — I129 Hypertensive chronic kidney disease with stage 1 through stage 4 chronic kidney disease, or unspecified chronic kidney disease: Secondary | ICD-10-CM | POA: Diagnosis not present

## 2017-04-13 DIAGNOSIS — N183 Chronic kidney disease, stage 3 (moderate): Secondary | ICD-10-CM | POA: Diagnosis not present

## 2017-04-13 DIAGNOSIS — R410 Disorientation, unspecified: Secondary | ICD-10-CM | POA: Diagnosis not present

## 2017-04-13 DIAGNOSIS — Z7984 Long term (current) use of oral hypoglycemic drugs: Secondary | ICD-10-CM | POA: Diagnosis not present

## 2017-04-13 DIAGNOSIS — F919 Conduct disorder, unspecified: Secondary | ICD-10-CM | POA: Insufficient documentation

## 2017-04-13 DIAGNOSIS — I2510 Atherosclerotic heart disease of native coronary artery without angina pectoris: Secondary | ICD-10-CM | POA: Insufficient documentation

## 2017-04-13 DIAGNOSIS — E1122 Type 2 diabetes mellitus with diabetic chronic kidney disease: Secondary | ICD-10-CM | POA: Diagnosis not present

## 2017-04-13 DIAGNOSIS — Z7982 Long term (current) use of aspirin: Secondary | ICD-10-CM | POA: Insufficient documentation

## 2017-04-13 HISTORY — DX: Dysphagia, unspecified: R13.10

## 2017-04-13 HISTORY — DX: Restlessness and agitation: R45.1

## 2017-04-13 HISTORY — DX: Chronic kidney disease, unspecified: N18.9

## 2017-04-13 LAB — CBC WITH DIFFERENTIAL/PLATELET
BASOS PCT: 0 %
Basophils Absolute: 0 10*3/uL (ref 0.0–0.1)
EOS ABS: 0 10*3/uL (ref 0.0–0.7)
Eosinophils Relative: 1 %
HCT: 35.7 % — ABNORMAL LOW (ref 39.0–52.0)
Hemoglobin: 12.5 g/dL — ABNORMAL LOW (ref 13.0–17.0)
LYMPHS ABS: 1.5 10*3/uL (ref 0.7–4.0)
Lymphocytes Relative: 43 %
MCH: 31.2 pg (ref 26.0–34.0)
MCHC: 35 g/dL (ref 30.0–36.0)
MCV: 89 fL (ref 78.0–100.0)
MONO ABS: 0.2 10*3/uL (ref 0.1–1.0)
MONOS PCT: 5 %
Neutro Abs: 1.8 10*3/uL (ref 1.7–7.7)
Neutrophils Relative %: 51 %
Platelets: 82 10*3/uL — ABNORMAL LOW (ref 150–400)
RBC: 4.01 MIL/uL — ABNORMAL LOW (ref 4.22–5.81)
RDW: 12.2 % (ref 11.5–15.5)
WBC: 3.5 10*3/uL — ABNORMAL LOW (ref 4.0–10.5)

## 2017-04-13 LAB — URINALYSIS, ROUTINE W REFLEX MICROSCOPIC
BILIRUBIN URINE: NEGATIVE
Glucose, UA: NEGATIVE mg/dL
HGB URINE DIPSTICK: NEGATIVE
KETONES UR: NEGATIVE mg/dL
Leukocytes, UA: NEGATIVE
NITRITE: NEGATIVE
PH: 5 (ref 5.0–8.0)
Protein, ur: NEGATIVE mg/dL
SPECIFIC GRAVITY, URINE: 1.013 (ref 1.005–1.030)

## 2017-04-13 LAB — CBG MONITORING, ED: GLUCOSE-CAPILLARY: 139 mg/dL — AB (ref 65–99)

## 2017-04-13 LAB — COMPREHENSIVE METABOLIC PANEL
ALBUMIN: 3.3 g/dL — AB (ref 3.5–5.0)
ALT: 10 U/L — ABNORMAL LOW (ref 17–63)
AST: 16 U/L (ref 15–41)
Alkaline Phosphatase: 45 U/L (ref 38–126)
Anion gap: 6 (ref 5–15)
BUN: 32 mg/dL — AB (ref 6–20)
CALCIUM: 8.7 mg/dL — AB (ref 8.9–10.3)
CHLORIDE: 102 mmol/L (ref 101–111)
CO2: 27 mmol/L (ref 22–32)
Creatinine, Ser: 2.24 mg/dL — ABNORMAL HIGH (ref 0.61–1.24)
GFR calc Af Amer: 35 mL/min — ABNORMAL LOW (ref >60)
GFR calc non Af Amer: 30 mL/min — ABNORMAL LOW (ref >60)
GLUCOSE: 140 mg/dL — AB (ref 65–99)
Potassium: 5 mmol/L (ref 3.5–5.1)
SODIUM: 135 mmol/L (ref 135–145)
TOTAL PROTEIN: 5.8 g/dL — AB (ref 6.5–8.1)
Total Bilirubin: 0.8 mg/dL (ref 0.3–1.2)

## 2017-04-13 LAB — TROPONIN I: Troponin I: <0.03 ng/mL (ref <0.03)

## 2017-04-13 LAB — VALPROIC ACID LEVEL: VALPROIC ACID LVL: 90 ug/mL (ref 50.0–100.0)

## 2017-04-13 MED ORDER — ZIPRASIDONE MESYLATE 20 MG IM SOLR
10.0000 mg | Freq: Once | INTRAMUSCULAR | Status: AC
  Administered 2017-04-13: 10 mg via INTRAMUSCULAR
  Filled 2017-04-13: qty 20

## 2017-04-13 MED ORDER — STERILE WATER FOR INJECTION IJ SOLN
INTRAMUSCULAR | Status: AC
  Administered 2017-04-13: 1.2 mL
  Filled 2017-04-13: qty 10

## 2017-04-13 MED ORDER — SODIUM CHLORIDE 0.9 % IV BOLUS (SEPSIS)
1000.0000 mL | Freq: Once | INTRAVENOUS | Status: AC
Start: 2017-04-13 — End: 2017-04-13
  Administered 2017-04-13: 1000 mL via INTRAVENOUS

## 2017-04-13 MED ORDER — LORAZEPAM 2 MG/ML IJ SOLN: 1.0000 mg | Freq: Once | INTRAMUSCULAR | Status: DC | PRN

## 2017-04-13 MED ORDER — LORAZEPAM 2 MG/ML IJ SOLN
1.0000 mg | Freq: Once | INTRAMUSCULAR | Status: AC | PRN
  Administered 2017-04-13: 1 mg via INTRAMUSCULAR
  Filled 2017-04-13: qty 1

## 2017-04-13 MED ORDER — LORAZEPAM 2 MG/ML IJ SOLN
1.0000 mg | Freq: Once | INTRAMUSCULAR | Status: AC
  Administered 2017-04-13: 1 mg via INTRAVENOUS
  Filled 2017-04-13: qty 1

## 2017-04-13 MED ORDER — LORAZEPAM 2 MG/ML IJ SOLN
1.0000 mg | Freq: Once | INTRAMUSCULAR | Status: AC
  Administered 2017-04-13: 1 mg via INTRAMUSCULAR
  Filled 2017-04-13: qty 1

## 2017-04-13 NOTE — ED Notes (Signed)
Pt thrashing about bed while attempting to take BP. Stating its stings. And making references to bees stinging him. Safety pads added

## 2017-04-13 NOTE — ED Triage Notes (Signed)
Pt sent from [facility] due to altered mental status. Staff reported to EMS that pt was fighting this morning with staff which is not his normal. Pt disoriented. Pt randomly talking nonsensical and thrashing about bed intermittently.

## 2017-04-13 NOTE — Discharge Instructions (Signed)
Follow up with your provider at the [facility]

## 2017-04-13 NOTE — ED Notes (Addendum)
Attempted to get CT scan done. Pt was calm and resting quietly prior to test. When pt was moved over to CT bed pt became very restless, pulling at things, attempting to get off of the bed. Pt unable to redirect. CT scan unable to be completed. Pt brought back to ED room. EDP notified. Order given for Geodon 10mg  IM.

## 2017-04-13 NOTE — ED Notes (Signed)
Tele assist at bedside

## 2017-04-13 NOTE — ED Notes (Signed)
Sitter at bedside. Avasys is discontinued.

## 2017-04-13 NOTE — ED Notes (Signed)
Pt unable to lie still for CT scan.

## 2017-04-13 NOTE — ED Notes (Signed)
EDP at bedside  

## 2017-04-13 NOTE — ED Provider Notes (Signed)
[facility] DEPT Provider Note   CSN: [id number] Arrival date & time: 04/13/17  0741     History   Chief Complaint Chief Complaint  Patient presents with  . Altered Mental Status    HPI Derrick Butler is a 61 y.o. male.  The history is provided by the nursing home and the EMS personnel. The history is limited by the condition of the patient (AMS).  Altered Mental Status    Pt was seen at 0750.  Per EMS and NH report:  Pt sent to ED from NH due to AMS, agitation and aggression. Pt was "fighting with staff" this morning. NH requests depakote level check. No reported fevers, falls, vomiting/diarrhea, focal motor weakness.    Past Medical History:  Diagnosis Date  . Agitation    and aggression  . Altered mental status    with psychosis  . Anemia   . Aortic dissection ([place])   . Aortic dissection ([place])   . Bipolar 1 disorder ([place])   . Cardiomyopathy   . Cardiomyopathy ([place])   . CKD (chronic kidney disease)   . Coronary artery disease   . CVA (cerebral infarction)   . CVA (cerebral infarction)   . Dehydration   . Diabetes mellitus   . Dysphagia   . Encephalopathy   . Hyperglycemia   . Hyperkalemia   . Hyperlipemia   . Hypertension   . Hypertension   . Hyponatremia   . Lack of coordination   . Poor historian   . Psychosis   . Psychosis   . Thrombocytopenia ([place])   . TIA (transient ischemic attack)     Patient Active Problem List   Diagnosis Date Noted  . Adenomatous colon polyp 01/26/2016  . AKI (acute kidney injury) ([place]) 01/12/2016  . DKA (diabetic ketoacidoses) ([place]) 01/09/2016  . Encounter for screening colonoscopy 11/23/2015  . Dysphagia 11/23/2015  . Bacteremia 11/09/2014  . Hyperkalemia 09/25/2014  . Hypoglycemia 09/25/2014  . HCAP (healthcare-associated pneumonia) 07/15/2014  . Acute encephalopathy 07/15/2014  . Sepsis ([place]) 07/15/2014  . Thrombocytopenia ([place]) 07/14/2014  . CAP (community acquired pneumonia) 07/13/2014  . Protein-calorie  malnutrition, severe ([place]) 09/09/2013  . CKD (chronic kidney disease) stage 3, GFR 30-59 ml/min 09/09/2013  . CVA, old, cognitive deficits 09/06/2013  . UTI (lower urinary tract infection) 09/06/2013  . Dehydration 06/19/2013  . H/O: stroke 06/18/2013  . Altered mental status 06/18/2013  . Bipolar I disorder, most recent episode mixed ([place]) 06/02/2013  . CARDIOMYOPATHY OTHER DISEASES CLASSIFIED ELSW 02/21/2010  . Essential hypertension 03/12/2009  . AORTIC DISSECTION 03/12/2009    Past Surgical History:  Procedure Laterality Date  . ASCENDING AORTIC ANEURYSM REPAIR    . BIOPSY  12/21/2015   Procedure: BIOPSY;  Surgeon: Danie Binder, MD;  Location: [facility];  Service: Endoscopy;;  gastric biopsy  . COLONOSCOPY WITH PROPOFOL N/A 12/21/2015   SLF: one 75mm polyp in the ascending colon , removed with a cold biopsy forceps. resectedand retrieved.  four 6- 8 mm polyps in the rectum , in the ascending colon and in the cedum, removed with a hot snare. resected and retrieved.  . ESOPHAGOGASTRODUODENOSCOPY (EGD) WITH PROPOFOL N/A 12/21/2015   SLF: Web in the distal esophagus dilated. Erosive gastropathy, biopsied normal duodenal bulb and second portion of the duodenum.   [name] POLYPECTOMY  12/21/2015   Procedure: POLYPECTOMY;  Surgeon: Danie Binder, MD;  Location: [facility];  Service: Endoscopy;;  at cecum and ascending colon; rectum  . SAVORY  DILATION N/A 12/21/2015   Procedure: SAVORY DILATION;  Surgeon: Danie Binder, MD;  Location: [facility];  Service: Endoscopy;  Laterality: N/A;       Home Medications    Prior to Admission medications   Medication Sig Start Date End Date Taking? Authorizing Provider  aspirin 325 MG tablet Take 325 mg by mouth daily.      [provider]  atorvastatin (LIPITOR) 10 MG tablet Take 10 mg by mouth daily.    [provider]  baclofen (LIORESAL) 10 MG tablet Take 10 mg by mouth 3 (three) times daily.    [provider]    BIOTIN PO Take 30 mg by mouth daily.     [provider]  cloNIDine (CATAPRES) 0.3 MG tablet Take 0.3 mg by mouth daily.      [provider]  divalproex (DEPAKOTE SPRINKLE) 125 MG capsule Take 375 mg by mouth 3 (three) times daily.     [provider]  fluticasone (FLONASE) 50 MCG/ACT nasal spray Place 2 sprays into the nose daily. For allergies    [provider]  labetalol (NORMODYNE) 200 MG tablet Take 200 mg by mouth 2 (two) times daily.      [provider]  metFORMIN (GLUCOPHAGE) 500 MG tablet Take 500 mg by mouth daily.     [provider]  omeprazole (PRILOSEC) 20 MG capsule 1 PO WITH BREAKFAST. 12/31/15   Fields, Sandi L, MD  QUEtiapine (SEROQUEL) 200 MG tablet Take 200 mg by mouth 2 (two) times daily.    [provider]  risperiDONE (RISPERDAL) 1 MG tablet Take 1 tablet (1 mg total) by mouth 3 (three) times daily. Patient taking differently: Take 1 mg by mouth 2 (two) times daily.  06/05/13   Cloria Spring, MD  trihexyphenidyl (ARTANE) 5 MG tablet Take 5 mg by mouth 2 (two) times daily with a meal.    [provider]    Family History Family History  Problem Relation Age of Onset  . Alzheimer's disease Mother   . Stroke Father   . Heart disease Father   . Bipolar disorder Father   . Bipolar disorder Sister   . Bipolar disorder Sister   . Colon cancer Neg Hx     Social History Social History  Substance Use Topics  . Smoking status: Never Smoker  . Smokeless tobacco: Never Used  . Alcohol use No     Allergies   Patient has no known allergies.   Review of Systems Review of Systems  Unable to perform ROS: Mental status change     Physical Exam Updated Vital Signs BP (!) 96/52 (BP Location: Right Arm)   Pulse (!) 57   Temp 98.3 F (36.8 C) (Oral)   Resp 16   Ht 5\' 10"  (1.778 m)   Wt 81.6 kg (180 lb)   SpO2 100%   BMI 25.83 kg/m   Physical Exam 0755: Physical examination:  Nursing  notes reviewed; Vital signs and O2 SAT reviewed;  Constitutional: Well developed, Well nourished, In no acute distress; Head:  Normocephalic, atraumatic; Eyes: EOMI, PERRL, No scleral icterus; ENMT: Mouth and pharynx normal, Mucous membranes dry; Neck: Supple, Full range of motion, No lymphadenopathy; Cardiovascular: Regular rate and rhythm, No gallop; Respiratory: Breath sounds clear & equal bilaterally, No wheezes. Speaking with ease, Normal respiratory effort/excursion; Chest: Nontender, Movement normal; Abdomen: Soft, Nontender, Nondistended, Normal bowel sounds; Genitourinary: No CVA tenderness; Extremities: Pulses normal, No tenderness, No edema, No  calf edema or asymmetry.; Neuro: Awake, alert, easily agitated, then calms. Speech rambling, slightly garbled. Moves all extremities on stretcher spontaneously.; Skin: Color normal, Warm, Dry.   ED Treatments / Results  Labs (all labs ordered are listed, but only abnormal results are displayed)   EKG  EKG Interpretation  Date/Time:  Friday April 13 2017 07:46:23 EDT Ventricular Rate:  53 PR Interval:    QRS Duration: 113 QT Interval:  515 QTC Calculation: 484 R Axis:   -22 Text Interpretation:  Sinus rhythm Atrial premature complex Prolonged PR interval Borderline intraventricular conduction delay Repol abnrm suggests ischemia, lateral leads Baseline wander When compared with ECG of 01/09/2016 Nonspecific ST and T wave abnormality Lateral leads is now Present Repeat tracings suggested Confirmed by Francine Graven [phone]) on 04/13/2017 8:07:15 AM        EKG Interpretation  Date/Time:  Friday April 13 2017 08:10:10 EDT Ventricular Rate:  52 PR Interval:    QRS Duration: 120 QT Interval:  511 QTC Calculation: 476 R Axis:   -24 Text Interpretation:  Sinus rhythm Prolonged PR interval Nonspecific intraventricular conduction delay Borderline repolarization abnormality When compared with ECG of 12/18/2015 and 01/09/2016 No significant change  was found Confirmed by Francine Graven [phone]) on 04/13/2017 8:28:22 AM        Radiology   Procedures Procedures (including critical care time)  Medications Ordered in ED Medications  LORazepam (ATIVAN) injection 1 mg (not administered)     Initial Impression / Assessment and Plan / ED Course  I have reviewed the triage vital signs and the nursing notes.  Pertinent labs & imaging results that were available during my care of the patient were reviewed by me and considered in my medical decision making (see chart for details).  MDM Reviewed: previous chart, nursing note and vitals Reviewed previous: labs and ECG Interpretation: labs, ECG, x-ray and CT scan   Results for orders placed or performed during the hospital encounter of 04/13/17  Comprehensive metabolic panel  Result Value Ref Range   Sodium 135 135 - 145 mmol/L   Potassium 5.0 3.5 - 5.1 mmol/L   Chloride 102 101 - 111 mmol/L   CO2 27 22 - 32 mmol/L   Glucose, Bld 140 (H) 65 - 99 mg/dL   BUN 32 (H) 6 - 20 mg/dL   Creatinine, Ser 2.24 (H) 0.61 - 1.24 mg/dL   Calcium 8.7 (L) 8.9 - 10.3 mg/dL   Total Protein 5.8 (L) 6.5 - 8.1 g/dL   Albumin 3.3 (L) 3.5 - 5.0 g/dL   AST 16 15 - 41 U/L   ALT 10 (L) 17 - 63 U/L   Alkaline Phosphatase 45 38 - 126 U/L   Total Bilirubin 0.8 0.3 - 1.2 mg/dL   GFR calc non Af Amer 30 (L) >60 mL/min   GFR calc Af Amer 35 (L) >60 mL/min   Anion gap 6 5 - 15  Troponin I  Result Value Ref Range   Troponin I <0.03 <0.03 ng/mL  CBC with Differential  Result Value Ref Range   WBC 3.5 (L) 4.0 - 10.5 K/uL   RBC 4.01 (L) 4.22 - 5.81 MIL/uL   Hemoglobin 12.5 (L) 13.0 - 17.0 g/dL   HCT 35.7 (L) 39.0 - 52.0 %   MCV 89.0 78.0 - 100.0 fL   MCH 31.2 26.0 - 34.0 pg   MCHC 35.0 30.0 - 36.0 g/dL   RDW 12.2 11.5 - 15.5 %   Platelets 82 (L) 150 - 400 K/uL  Neutrophils Relative % 51 %   Neutro Abs 1.8 1.7 - 7.7 K/uL   Lymphocytes Relative 43 %   Lymphs Abs 1.5 0.7 - 4.0 K/uL   Monocytes  Relative 5 %   Monocytes Absolute 0.2 0.1 - 1.0 K/uL   Eosinophils Relative 1 %   Eosinophils Absolute 0.0 0.0 - 0.7 K/uL   Basophils Relative 0 %   Basophils Absolute 0.0 0.0 - 0.1 K/uL  Urinalysis, Routine w reflex microscopic  Result Value Ref Range   Color, Urine YELLOW YELLOW   APPearance CLEAR CLEAR   Specific Gravity, Urine 1.013 1.005 - 1.030   pH 5.0 5.0 - 8.0   Glucose, UA NEGATIVE NEGATIVE mg/dL   Hgb urine dipstick NEGATIVE NEGATIVE   Bilirubin Urine NEGATIVE NEGATIVE   Ketones, ur NEGATIVE NEGATIVE mg/dL   Protein, ur NEGATIVE NEGATIVE mg/dL   Nitrite NEGATIVE NEGATIVE   Leukocytes, UA NEGATIVE NEGATIVE  Valproic acid level  Result Value Ref Range   Valproic Acid Lvl 90 50.0 - 100.0 ug/mL  CBG monitoring, ED  Result Value Ref Range   Glucose-Capillary 139 (H) 65 - 99 mg/dL   Dg Chest 1 View Result Date: 04/13/2017 CLINICAL DATA:  Agitation EXAM: CHEST 1 VIEW COMPARISON:  01/09/2016 FINDINGS: Limited low volume chest with interstitial crowding. No edema, effusion, or convincing pneumonia. Due to positioning the apices are partially obscured. Chronic cardiomegaly. Status post median sternotomy with history of ascending aortic aneurysm repair. IMPRESSION: 1. Limited low volume chest without acute finding. 2. Chronic cardiomegaly. Electronically Signed   By: Monte Fantasia M.D.   On: 04/13/2017 10:40    Ct Head Wo Contrast Result Date: 04/13/2017 CLINICAL DATA:  Agitation, altered mental status EXAM: CT HEAD WITHOUT CONTRAST TECHNIQUE: Contiguous axial images were obtained from the base of the skull through the vertex without intravenous contrast. COMPARISON:  CT head 01/09/2016 FINDINGS: Image quality markedly degraded by motion. The study was repeated with similar motion. Chronic right posterior cerebral artery infarct involving the temporal lobe and occipital lobe similar to the prior study. Chronic infarct in the deep white matter on the left also unchanged. Generalized  atrophy. Negative for hemorrhage.  No acute infarct or mass. No acute skeletal abnormality.  Paranasal sinuses clear. IMPRESSION: Image quality markedly degraded by motion. Allowing for this, no acute abnormality. Electronically Signed   By: Franchot Gallo M.D.   On: 04/13/2017 16:09     1530:  T/C from NH NP: states she was not aware pt was sent to the ED for his behavior today, episodes of agitation/aggression is pt's baseline behavior and not new for him, he did not need to be sent to the ED, does not need psych eval, etc.  I informed NP of pt's ED course, dx testing and tx, including: ativan/geodon, IVF given for elevated BUN/Cr.  I informed her that I was awaiting CT-H, but would likely d/c him back to NH, as I did not have a clear indication for admission. She agreed with me and requested I d/c him back to NH if CT-H negative.   1610:  No gross abnl on CT-H. Will d/c back to NH.    Final Clinical Impressions(s) / ED Diagnoses   Final diagnoses:  None    New Prescriptions New Prescriptions   No medications on file      Francine Graven, DO 04/18/17 1520

## 2017-04-14 ENCOUNTER — Encounter (HOSPITAL_COMMUNITY): Payer: Self-pay | Admitting: Emergency Medicine

## 2017-04-14 ENCOUNTER — Emergency Department (HOSPITAL_COMMUNITY)
Admission: EM | Admit: 2017-04-14 | Discharge: 2017-04-14 | Disposition: A | Discharge status: Skilled Nursing Facility | Payer: Medicare Other | Attending: Emergency Medicine | Admitting: Emergency Medicine

## 2017-04-14 DIAGNOSIS — I2510 Atherosclerotic heart disease of native coronary artery without angina pectoris: Secondary | ICD-10-CM | POA: Insufficient documentation

## 2017-04-14 DIAGNOSIS — R4589 Other symptoms and signs involving emotional state: Secondary | ICD-10-CM | POA: Insufficient documentation

## 2017-04-14 DIAGNOSIS — N179 Acute kidney failure, unspecified: Secondary | ICD-10-CM | POA: Diagnosis not present

## 2017-04-14 DIAGNOSIS — E1129 Type 2 diabetes mellitus with other diabetic kidney complication: Secondary | ICD-10-CM | POA: Insufficient documentation

## 2017-04-14 DIAGNOSIS — I10 Essential (primary) hypertension: Secondary | ICD-10-CM | POA: Insufficient documentation

## 2017-04-14 DIAGNOSIS — Z79899 Other long term (current) drug therapy: Secondary | ICD-10-CM | POA: Insufficient documentation

## 2017-04-14 DIAGNOSIS — Z7982 Long term (current) use of aspirin: Secondary | ICD-10-CM | POA: Insufficient documentation

## 2017-04-14 DIAGNOSIS — R4689 Other symptoms and signs involving appearance and behavior: Secondary | ICD-10-CM

## 2017-04-14 DIAGNOSIS — R4182 Altered mental status, unspecified: Secondary | ICD-10-CM | POA: Diagnosis present

## 2017-04-14 LAB — CBC WITH DIFFERENTIAL/PLATELET
BASOS ABS: 0 10*3/uL (ref 0.0–0.1)
Basophils Relative: 0 %
EOS PCT: 1 %
Eosinophils Absolute: 0 10*3/uL (ref 0.0–0.7)
HCT: 34.5 % — ABNORMAL LOW (ref 39.0–52.0)
Hemoglobin: 11.8 g/dL — ABNORMAL LOW (ref 13.0–17.0)
LYMPHS PCT: 37 %
Lymphs Abs: 1.4 10*3/uL (ref 0.7–4.0)
MCH: 31 pg (ref 26.0–34.0)
MCHC: 34.2 g/dL (ref 30.0–36.0)
MCV: 90.6 fL (ref 78.0–100.0)
Monocytes Absolute: 0.4 10*3/uL (ref 0.1–1.0)
Monocytes Relative: 12 %
NEUTROS ABS: 1.8 10*3/uL (ref 1.7–7.7)
NEUTROS PCT: 49 %
PLATELETS: 81 10*3/uL — AB (ref 150–400)
RBC: 3.81 MIL/uL — ABNORMAL LOW (ref 4.22–5.81)
RDW: 12.1 % (ref 11.5–15.5)
WBC: 3.7 10*3/uL — AB (ref 4.0–10.5)

## 2017-04-14 LAB — URINE CULTURE: Culture: NO GROWTH

## 2017-04-14 LAB — COMPREHENSIVE METABOLIC PANEL
ALT: 8 U/L — ABNORMAL LOW (ref 17–63)
AST: 20 U/L (ref 15–41)
Albumin: 3.4 g/dL — ABNORMAL LOW (ref 3.5–5.0)
Alkaline Phosphatase: 43 U/L (ref 38–126)
Anion gap: 8 (ref 5–15)
BUN: 35 mg/dL — AB (ref 6–20)
CALCIUM: 8.7 mg/dL — AB (ref 8.9–10.3)
CHLORIDE: 106 mmol/L (ref 101–111)
CO2: 24 mmol/L (ref 22–32)
CREATININE: 2.24 mg/dL — AB (ref 0.61–1.24)
GFR, EST AFRICAN AMERICAN: 35 mL/min — AB (ref >60)
GFR, EST NON AFRICAN AMERICAN: 30 mL/min — AB (ref >60)
Glucose, Bld: 192 mg/dL — ABNORMAL HIGH (ref 65–99)
POTASSIUM: 4.3 mmol/L (ref 3.5–5.1)
Sodium: 138 mmol/L (ref 135–145)
TOTAL PROTEIN: 5.7 g/dL — AB (ref 6.5–8.1)
Total Bilirubin: 0.9 mg/dL (ref 0.3–1.2)

## 2017-04-14 LAB — AMMONIA: Ammonia: 16 umol/L (ref 9–35)

## 2017-04-14 LAB — VALPROIC ACID LEVEL: Valproic Acid Lvl: 40 ug/mL — ABNORMAL LOW (ref 50.0–100.0)

## 2017-04-14 MED ORDER — SODIUM CHLORIDE 0.9 % IV BOLUS (SEPSIS)
1000.0000 mL | Freq: Once | INTRAVENOUS | Status: AC
  Administered 2017-04-14: 1000 mL via INTRAVENOUS

## 2017-04-14 MED ORDER — ZIPRASIDONE MESYLATE 20 MG IM SOLR
10.0000 mg | Freq: Once | INTRAMUSCULAR | Status: AC
Start: 2017-04-14 — End: 2017-04-14
  Administered 2017-04-14: 10 mg via INTRAMUSCULAR
  Filled 2017-04-14: qty 20

## 2017-04-14 MED ORDER — STERILE WATER FOR INJECTION IJ SOLN
INTRAMUSCULAR | Status: AC
  Administered 2017-04-14: 2 mL
  Filled 2017-04-14: qty 10

## 2017-04-14 MED ORDER — LORAZEPAM 2 MG/ML IJ SOLN
1.0000 mg | Freq: Once | INTRAMUSCULAR | Status: AC
Start: 2017-04-14 — End: 2017-04-14
  Administered 2017-04-14: 1 mg via INTRAMUSCULAR
  Filled 2017-04-14: qty 1

## 2017-04-14 NOTE — ED Triage Notes (Signed)
Pt sent over by [facility] for combative behavior this am. Pt was seen for the same.

## 2017-04-14 NOTE — ED Notes (Signed)
Patient transported to [facility] via [facility].

## 2017-04-14 NOTE — ED Provider Notes (Signed)
[facility] DEPT Provider Note   CSN: [id number] Arrival date & time: 04/14/17  0423     History   Chief Complaint Chief Complaint  Patient presents with  . Aggressive Behavior    HPI Derrick Butler is a 61 y.o. male.  Level V caveat for altered mental status. Patient presents from nursing home with aggressive behavior and combativeness. This is his second ED visit in less than 24 hours. Nursing home staff reports that he has been more aggressive compared to his baseline with difficult to control behavior and hitting at staff. Denies any recent medication changes. Denies any recent illnesses. No fever, chills, nausea or vomiting. No chest pain or shortness of breath.   The history is provided by the patient, the EMS personnel and a caregiver. The history is limited by the condition of the patient.    Past Medical History:  Diagnosis Date  . Agitation    and aggression  . Altered mental status    with psychosis  . Anemia   . Aortic dissection ([place])   . Aortic dissection ([place])   . Bipolar 1 disorder ([place])   . Cardiomyopathy   . Cardiomyopathy ([place])   . CKD (chronic kidney disease)   . Coronary artery disease   . CVA (cerebral infarction)   . CVA (cerebral infarction)   . Dehydration   . Diabetes mellitus   . Dysphagia   . Encephalopathy   . Hyperglycemia   . Hyperkalemia   . Hyperlipemia   . Hypertension   . Hypertension   . Hyponatremia   . Lack of coordination   . Poor historian   . Psychosis   . Psychosis   . Thrombocytopenia ([place])   . TIA (transient ischemic attack)     Patient Active Problem List   Diagnosis Date Noted  . Adenomatous colon polyp 01/26/2016  . AKI (acute kidney injury) ([place]) 01/12/2016  . DKA (diabetic ketoacidoses) ([place]) 01/09/2016  . Encounter for screening colonoscopy 11/23/2015  . Dysphagia 11/23/2015  . Bacteremia 11/09/2014  . Hyperkalemia 09/25/2014  . Hypoglycemia 09/25/2014  . HCAP (healthcare-associated pneumonia)  07/15/2014  . Acute encephalopathy 07/15/2014  . Sepsis ([place]) 07/15/2014  . Thrombocytopenia ([place]) 07/14/2014  . CAP (community acquired pneumonia) 07/13/2014  . Protein-calorie malnutrition, severe ([place]) 09/09/2013  . CKD (chronic kidney disease) stage 3, GFR 30-59 ml/min 09/09/2013  . CVA, old, cognitive deficits 09/06/2013  . UTI (lower urinary tract infection) 09/06/2013  . Dehydration 06/19/2013  . H/O: stroke 06/18/2013  . Altered mental status 06/18/2013  . Bipolar I disorder, most recent episode mixed ([place]) 06/02/2013  . CARDIOMYOPATHY OTHER DISEASES CLASSIFIED ELSW 02/21/2010  . Essential hypertension 03/12/2009  . AORTIC DISSECTION 03/12/2009    Past Surgical History:  Procedure Laterality Date  . ASCENDING AORTIC ANEURYSM REPAIR    . BIOPSY  12/21/2015   Procedure: BIOPSY;  Surgeon: Danie Binder, MD;  [facility];  Service: Endoscopy;;  gastric biopsy  . COLONOSCOPY WITH PROPOFOL N/A 12/21/2015   SLF: one 41mm polyp in the ascending colon , removed with a cold biopsy forceps. resectedand retrieved.  four 6- 8 mm polyps in the rectum , in the ascending colon and in the cedum, removed with a hot snare. resected and retrieved.  . ESOPHAGOGASTRODUODENOSCOPY (EGD) WITH PROPOFOL N/A 12/21/2015   SLF: Web in the distal esophagus dilated. Erosive gastropathy, biopsied normal duodenal bulb and second portion of the duodenum.   [name] POLYPECTOMY  12/21/2015   Procedure: POLYPECTOMY;  Surgeon: Danie Binder, MD;  [facility];  Service: Endoscopy;;  at cecum and ascending colon; rectum  . SAVORY DILATION N/A 12/21/2015   Procedure: SAVORY DILATION;  Surgeon: Danie Binder, MD;  [facility];  Service: Endoscopy;  Laterality: N/A;       Home Medications    Prior to Admission medications   Medication Sig Start Date End Date Taking? Authorizing Provider  acetaminophen (TYLENOL) 325 MG tablet Take 650 mg by mouth every 6 (six) hours as needed for mild  pain, moderate pain or fever.    [provider]  aspirin EC 81 MG tablet Take 81 mg by mouth daily.    [provider]  atorvastatin (LIPITOR) 20 MG tablet Take 20 mg by mouth daily.    [provider]  baclofen (LIORESAL) 10 MG tablet Take 10 mg by mouth 3 (three) times daily.    [provider]  BIOTIN PO Take 30 mg by mouth daily.     [provider]  Cholecalciferol 50000 units capsule Take 50,000 Units by mouth every 30 (thirty) days.    [provider]  divalproex (DEPAKOTE SPRINKLE) 125 MG capsule Take 375 mg by mouth 3 (three) times daily.     [provider]  famotidine (PEPCID) 20 MG tablet Take 20 mg by mouth 2 (two) times daily.    [provider]  fluticasone (FLONASE) 50 MCG/ACT nasal spray Place 2 sprays into the nose daily. For allergies    [provider]  Insulin Detemir (LEVEMIR FLEXPEN) 100 UNIT/ML Pen Inject 10 Units into the skin daily at 10 pm.    [provider]  labetalol (NORMODYNE) 200 MG tablet Take 200 mg by mouth 2 (two) times daily.      [provider]  losartan (COZAAR) 50 MG tablet Take 50 mg by mouth daily.    [provider]  omega-3 acid ethyl esters (LOVAZA) 1 g capsule Take 1 g by mouth 2 (two) times daily.    [provider]  QUEtiapine (SEROQUEL) 200 MG tablet Take 200 mg by mouth 2 (two) times daily.    [provider]  risperiDONE (RISPERDAL) 1 MG tablet Take 1 tablet (1 mg total) by mouth 3 (three) times daily. Patient taking differently: Take 1 mg by mouth 2 (two) times daily.  06/05/13   Cloria Spring, MD  trihexyphenidyl (ARTANE) 5 MG tablet Take 5 mg by mouth 2 (two) times daily with a meal.    [provider]    Family History Family History  Problem Relation Age of Onset  . Alzheimer's disease Mother   . Stroke Father   . Heart disease Father   . Bipolar disorder Father   . Bipolar disorder Sister   .  Bipolar disorder Sister   . Colon cancer Neg Hx     Social History Social History  Substance Use Topics  . Smoking status: Never Smoker  . Smokeless tobacco: Never Used  . Alcohol use No     Allergies   Patient has no known allergies.   Review of Systems Review of Systems  Unable to perform ROS: Mental status change     Physical Exam Updated Vital Signs BP (!) 123/59   Pulse 66   Temp 97.7 F (36.5 C)   Resp 20   Ht 5\' 10"  (1.778 m)   Wt 81.6 kg (180 lb)   SpO2 95%   BMI 25.83 kg/m  Physical Exam  Constitutional: He appears well-developed and well-nourished. No distress.  Sleeping on exam, calm. Arouses and answers some simple questions appropriately disheveled  HENT:  Head: Normocephalic and atraumatic.  Mouth/Throat: Oropharynx is clear and moist. No oropharyngeal exudate.  Eyes: Pupils are equal, round, and reactive to light. Conjunctivae and EOM are normal.  Neck: Normal range of motion. Neck supple.  No C spine tenderness  Cardiovascular: Normal rate, regular rhythm, normal heart sounds and intact distal pulses.   No murmur heard. Pulmonary/Chest: Effort normal and breath sounds normal. No respiratory distress. He exhibits no tenderness.  Abdominal: Soft. There is no tenderness. There is no rebound and no guarding.  Musculoskeletal: Normal range of motion. He exhibits no edema or tenderness.  Neurological: He is alert. No cranial nerve deficit. He exhibits normal muscle tone. Coordination normal.  Oriented to person only. Does not cooperate for formal neurological testing. He is moving all of his extremities equally. no facial droop. Rambling and garbled speech  Skin: Skin is warm. Capillary refill takes less than 2 seconds.  Psychiatric: He has a normal mood and affect. His behavior is normal.  Nursing note and vitals reviewed.    ED Treatments / Results  Labs (all labs ordered are listed, but only abnormal results are displayed) Labs Reviewed    CBC WITH DIFFERENTIAL/PLATELET - Abnormal; Notable for the following:       Result Value   WBC 3.7 (*)    RBC 3.81 (*)    Hemoglobin 11.8 (*)    HCT 34.5 (*)    Platelets 81 (*)    All other components within normal limits  COMPREHENSIVE METABOLIC PANEL - Abnormal; Notable for the following:    Glucose, Bld 192 (*)    BUN 35 (*)    Creatinine, Ser 2.24 (*)    Calcium 8.7 (*)    Total Protein 5.7 (*)    Albumin 3.4 (*)    ALT 8 (*)    GFR calc non Af Amer 30 (*)    GFR calc Af Amer 35 (*)    All other components within normal limits  VALPROIC ACID LEVEL - Abnormal; Notable for the following:    Valproic Acid Lvl 40 (*)    All other components within normal limits  AMMONIA  URINALYSIS, ROUTINE W REFLEX MICROSCOPIC  RAPID URINE DRUG SCREEN, HOSP PERFORMED    EKG  EKG Interpretation  Date/Time:  Saturday April 14 2017 05:11:33 EDT Ventricular Rate:  63 PR Interval:    QRS Duration: 108 QT Interval:  422 QTC Calculation: 432 R Axis:   -15 Text Interpretation:  Sinus rhythm Atrial premature complexes Prolonged PR interval Borderline left axis deviation Abnormal T, consider ischemia, lateral leads No significant change was found Confirmed by Ezequiel Essex [phone]) on 04/14/2017 5:16:17 AM       Radiology Dg Chest 1 View  Result Date: 04/13/2017 CLINICAL DATA:  Agitation EXAM: CHEST 1 VIEW COMPARISON:  01/09/2016 FINDINGS: Limited low volume chest with interstitial crowding. No edema, effusion, or convincing pneumonia. Due to positioning the apices are partially obscured. Chronic cardiomegaly. Status post median sternotomy with history of ascending aortic aneurysm repair. IMPRESSION: 1. Limited low volume chest without acute finding. 2. Chronic cardiomegaly. Electronically Signed   By: Monte Fantasia M.D.   On: 04/13/2017 10:40   Ct Head Wo Contrast  Result Date: 04/13/2017 CLINICAL DATA:  Agitation, altered mental status EXAM: CT HEAD WITHOUT CONTRAST TECHNIQUE:  Contiguous axial images were obtained from the base  of the skull through the vertex without intravenous contrast. COMPARISON:  CT head 01/09/2016 FINDINGS: Image quality markedly degraded by motion. The study was repeated with similar motion. Chronic right posterior cerebral artery infarct involving the temporal lobe and occipital lobe similar to the prior study. Chronic infarct in the deep white matter on the left also unchanged. Generalized atrophy. Negative for hemorrhage.  No acute infarct or mass. No acute skeletal abnormality.  Paranasal sinuses clear. IMPRESSION: Image quality markedly degraded by motion. Allowing for this, no acute abnormality. Electronically Signed   By: Franchot Gallo M.D.   On: 04/13/2017 16:09    Procedures Procedures (including critical care time)  Medications Ordered in ED Medications  LORazepam (ATIVAN) injection 1 mg (not administered)  ziprasidone (GEODON) injection 10 mg (not administered)  sterile water (preservative free) injection (not administered)     Initial Impression / Assessment and Plan / ED Course  I have reviewed the triage vital signs and the nursing notes.  Pertinent labs & imaging results that were available during my care of the patient were reviewed by me and considered in my medical decision making (see chart for details).    Patient again sent from nursing home with aggressive behavior. He is calm on arrival. Discussed with Hilma Favors RN at [facility] who was familiar with patient and sent him over last night as well. She denies any recent medication changes. No recent fever or illnesses.  Patient had lab workup yesterday that showed elevated creatinine of 2.24. Baseline is about 1.5. Urinalysis was negative. CT head and chest x-ray were negative.  Labs today show stable creatinine 2.24. IV fluids given. Depakote level therapeutic. Ammonia level within normal limits  Patient given IV fluids in the ED. Creatinine is the same as  yesterday despite receiving IV fluids yesterday. We'll have patient hold ARB. Discussed with Dr. Jerilee Hoh of hospitalist service who agrees patient can likely return to nursing home to have recheck of kidney function in a few days and does not need admission for IV fluids.  Discussed with nursing home NP Glennon Mac who is aware of elevated creatinine and will arrange for recheck next week.  Patient appears to be at his baseline per NP [facility]. IVF given in ED and patient will need recheck of renal function next week. Instructions to hold ARB given to NH.   Final Clinical Impressions(s) / ED Diagnoses   Final diagnoses:  Aggressive behavior  Acute renal failure, unspecified acute renal failure type [facility])    New Prescriptions New Prescriptions   No medications on file     Ezequiel Essex, MD 04/14/17 0900

## 2017-04-14 NOTE — Discharge Instructions (Addendum)
Stop taking losartan because of renal failure. Increase fluids by mouth. Have kidney functio rechecked next week. Return to the ED with new or worsening symptoms.

## 2017-07-30 ENCOUNTER — Other Ambulatory Visit (HOSPITAL_COMMUNITY): Payer: Self-pay | Admitting: Nephrology

## 2017-07-30 DIAGNOSIS — N183 Chronic kidney disease, stage 3 unspecified: Secondary | ICD-10-CM

## 2017-08-15 ENCOUNTER — Ambulatory Visit (HOSPITAL_COMMUNITY): Admission: RE | Admit: 2017-08-15 | Payer: Medicare Other | Source: Ambulatory Visit

## 2017-11-05 ENCOUNTER — Other Ambulatory Visit: Payer: Self-pay

## 2017-11-05 NOTE — Patient Outreach (Signed)
[facility]) Care Management  11/05/2017  QUINTERRIUS ERRINGTON Dec 12, 1955 [id number]   Medication Adherence call to Mr. Tres Grzywacz the reason for this call is because Mr. Mcfarren is showing up past due under [facility] Ins.on Atorvastatin 20 mg call the patient but,he is at a long term facility at this time and the facility provide all his medication for him.   [facility] Management Direct Dial [phone]  Fax [phone] [email or web address]

## 2018-07-06 ENCOUNTER — Other Ambulatory Visit: Payer: Self-pay

## 2018-07-06 ENCOUNTER — Emergency Department (HOSPITAL_COMMUNITY)
Admission: EM | Admit: 2018-07-06 | Discharge: 2018-07-06 | Disposition: A | Discharge status: Home / Self Care | Payer: Medicare Other | Attending: Emergency Medicine | Admitting: Emergency Medicine

## 2018-07-06 ENCOUNTER — Encounter (HOSPITAL_COMMUNITY): Payer: Self-pay

## 2018-07-06 DIAGNOSIS — W19XXXA Unspecified fall, initial encounter: Secondary | ICD-10-CM

## 2018-07-06 DIAGNOSIS — N183 Chronic kidney disease, stage 3 (moderate): Secondary | ICD-10-CM | POA: Insufficient documentation

## 2018-07-06 DIAGNOSIS — Z79899 Other long term (current) drug therapy: Secondary | ICD-10-CM | POA: Insufficient documentation

## 2018-07-06 DIAGNOSIS — Z7982 Long term (current) use of aspirin: Secondary | ICD-10-CM | POA: Insufficient documentation

## 2018-07-06 DIAGNOSIS — E785 Hyperlipidemia, unspecified: Secondary | ICD-10-CM | POA: Diagnosis not present

## 2018-07-06 DIAGNOSIS — Z043 Encounter for examination and observation following other accident: Secondary | ICD-10-CM | POA: Insufficient documentation

## 2018-07-06 DIAGNOSIS — Z794 Long term (current) use of insulin: Secondary | ICD-10-CM | POA: Diagnosis not present

## 2018-07-06 DIAGNOSIS — E119 Type 2 diabetes mellitus without complications: Secondary | ICD-10-CM | POA: Diagnosis not present

## 2018-07-06 DIAGNOSIS — I129 Hypertensive chronic kidney disease with stage 1 through stage 4 chronic kidney disease, or unspecified chronic kidney disease: Secondary | ICD-10-CM | POA: Diagnosis not present

## 2018-07-06 DIAGNOSIS — I2510 Atherosclerotic heart disease of native coronary artery without angina pectoris: Secondary | ICD-10-CM | POA: Diagnosis not present

## 2018-07-06 DIAGNOSIS — Z8673 Personal history of transient ischemic attack (TIA), and cerebral infarction without residual deficits: Secondary | ICD-10-CM | POA: Diagnosis not present

## 2018-07-06 NOTE — ED Notes (Signed)
Pt is able to move all extremities. NAD. No complaints

## 2018-07-06 NOTE — ED Provider Notes (Signed)
[facility] EMERGENCY DEPARTMENT Provider Note   CSN: [id number] Arrival date & time: 07/06/18  1132     History   Chief Complaint Chief Complaint  Patient presents with  . Fall    HPI Derrick Butler is a 62 y.o. male.  HPI  Level 5 caveat due to baseline cognitive abilities    62 year old male presents status post fall.  Patient reports that his shoe got caught on something.  He denies any pain, he cannot provide any further details.  Past Medical History:  Diagnosis Date  . Agitation    and aggression  . Altered mental status    with psychosis  . Anemia   . Aortic dissection ([place])   . Aortic dissection ([place])   . Bipolar 1 disorder ([place])   . Cardiomyopathy   . Cardiomyopathy ([place])   . CKD (chronic kidney disease)   . Coronary artery disease   . CVA (cerebral infarction)   . CVA (cerebral infarction)   . Dehydration   . Diabetes mellitus   . Dysphagia   . Encephalopathy   . Hyperglycemia   . Hyperkalemia   . Hyperlipemia   . Hypertension   . Hypertension   . Hyponatremia   . Lack of coordination   . Poor historian   . Psychosis ([place])   . Psychosis ([place])   . Thrombocytopenia ([place])   . TIA (transient ischemic attack)     Patient Active Problem List   Diagnosis Date Noted  . Adenomatous colon polyp 01/26/2016  . AKI (acute kidney injury) ([place]) 01/12/2016  . DKA (diabetic ketoacidoses) ([place]) 01/09/2016  . Encounter for screening colonoscopy 11/23/2015  . Dysphagia 11/23/2015  . Bacteremia 11/09/2014  . Hyperkalemia 09/25/2014  . Hypoglycemia 09/25/2014  . HCAP (healthcare-associated pneumonia) 07/15/2014  . Acute encephalopathy 07/15/2014  . Sepsis ([place]) 07/15/2014  . Thrombocytopenia ([place]) 07/14/2014  . CAP (community acquired pneumonia) 07/13/2014  . Protein-calorie malnutrition, severe ([place]) 09/09/2013  . CKD (chronic kidney disease) stage 3, GFR 30-59 ml/min (H[facility]) 09/09/2013  . CVA, old, cognitive deficits 09/06/2013  . UTI (lower urinary  tract infection) 09/06/2013  . Dehydration 06/19/2013  . H/O: stroke 06/18/2013  . Altered mental status 06/18/2013  . Bipolar I disorder, most recent episode mixed ([place]) 06/02/2013  . CARDIOMYOPATHY OTHER DISEASES CLASSIFIED ELSW 02/21/2010  . Essential hypertension 03/12/2009  . AORTIC DISSECTION 03/12/2009    Past Surgical History:  Procedure Laterality Date  . ASCENDING AORTIC ANEURYSM REPAIR    . BIOPSY  12/21/2015   Procedure: BIOPSY;  Surgeon: Danie Binder, MD;  [facility];  Service: Endoscopy;;  gastric biopsy  . COLONOSCOPY WITH PROPOFOL N/A 12/21/2015   SLF: one 69mm polyp in the ascending colon , removed with a cold biopsy forceps. resectedand retrieved.  four 6- 8 mm polyps in the rectum , in the ascending colon and in the cedum, removed with a hot snare. resected and retrieved.  . ESOPHAGOGASTRODUODENOSCOPY (EGD) WITH PROPOFOL N/A 12/21/2015   SLF: Web in the distal esophagus dilated. Erosive gastropathy, biopsied normal duodenal bulb and second portion of the duodenum.   [name] POLYPECTOMY  12/21/2015   Procedure: POLYPECTOMY;  Surgeon: Danie Binder, MD;  [facility];  Service: Endoscopy;;  at cecum and ascending colon; rectum  . SAVORY DILATION N/A 12/21/2015   Procedure: SAVORY DILATION;  Surgeon: Danie Binder, MD;  [facility];  Service: Endoscopy;  Laterality: N/A;        Home  Medications    Prior to Admission medications   Medication Sig Start Date End Date Taking? Authorizing Provider  acetaminophen (TYLENOL) 325 MG tablet Take 650 mg by mouth every 6 (six) hours as needed for mild pain, moderate pain or fever.    [provider]  aspirin EC 81 MG tablet Take 81 mg by mouth daily.    [provider]  atorvastatin (LIPITOR) 20 MG tablet Take 20 mg by mouth daily.    [provider]  baclofen (LIORESAL) 10 MG tablet Take 10 mg by mouth 3 (three) times daily.    [provider]  BIOTIN PO Take 30  mg by mouth daily.     [provider]  Cholecalciferol 50000 units capsule Take 50,000 Units by mouth every 30 (thirty) days.    [provider]  divalproex (DEPAKOTE SPRINKLE) 125 MG capsule Take 375 mg by mouth 3 (three) times daily.     [provider]  famotidine (PEPCID) 20 MG tablet Take 20 mg by mouth 2 (two) times daily.    [provider]  fluticasone (FLONASE) 50 MCG/ACT nasal spray Place 2 sprays into the nose daily. For allergies    [provider]  Insulin Detemir (LEVEMIR FLEXPEN) 100 UNIT/ML Pen Inject 10 Units into the skin daily at 10 pm.    [provider]  labetalol (NORMODYNE) 200 MG tablet Take 200 mg by mouth 2 (two) times daily.      [provider]  omega-3 acid ethyl esters (LOVAZA) 1 g capsule Take 1 g by mouth 2 (two) times daily.    [provider]  QUEtiapine (SEROQUEL) 200 MG tablet Take 200 mg by mouth 2 (two) times daily.    [provider]  risperiDONE (RISPERDAL) 1 MG tablet Take 1 tablet (1 mg total) by mouth 3 (three) times daily. Patient taking differently: Take 1 mg by mouth 2 (two) times daily.  06/05/13   Cloria Spring, MD  trihexyphenidyl (ARTANE) 5 MG tablet Take 5 mg by mouth 2 (two) times daily with a meal.    [provider]    Family History Family History  Problem Relation Age of Onset  . Alzheimer's disease Mother   . Stroke Father   . Heart disease Father   . Bipolar disorder Father   . Bipolar disorder Sister   . Bipolar disorder Sister   . Colon cancer Neg Hx     Social History Social History   Tobacco Use  . Smoking status: Never Smoker  . Smokeless tobacco: Never Used  Substance Use Topics  . Alcohol use: No    Alcohol/week: 0.0 standard drinks  . Drug use: No     Allergies   Patient has no known allergies.   Review of Systems Review of Systems  Unable to perform ROS: Dementia  All other systems reviewed and are  negative.  Physical Exam Updated Vital Signs BP (!) 178/54 (BP Location: Right Arm)   Pulse (!) 55   Temp 97.8 F (36.6 C)   Resp 18   Wt 81.6 kg   SpO2 100%   BMI 25.81 kg/m   Physical Exam  Constitutional: He is oriented to person, place, and time. He appears well-developed and well-nourished.  HENT:  Head: Normocephalic and atraumatic.  Eyes: Pupils are equal, round, and reactive to light. Conjunctivae are normal. Right eye exhibits no discharge. Left eye exhibits no discharge. No scleral icterus.  Neck: Normal range of motion. No JVD  present. No tracheal deviation present.  Pulmonary/Chest: Effort normal. No stridor.  Chest wall nontender no signs of trauma lung expansion normal lung sounds clear  Musculoskeletal:  Upper and lower extremities atraumatic full active range of motion pain-free, no CT or L-spine tenderness palpation, no trauma to the back, hips stable with AP and lateral compression  Neurological: He is alert and oriented to person, place, and time. Coordination normal.  Skin: Skin is warm.  Wound covered with bandages on the left lower extremity  Psychiatric: He has a normal mood and affect. His behavior is normal. Judgment and thought content normal.  Nursing note and vitals reviewed.    ED Treatments / Results  Labs (all labs ordered are listed, but only abnormal results are displayed) Labs Reviewed - No data to display  EKG None  Radiology No results found.  Procedures Procedures (including critical care time)  Medications Ordered in ED Medications - No data to display   Initial Impression / Assessment and Plan / ED Course  I have reviewed the triage vital signs and the nursing notes.  Pertinent labs & imaging results that were available during my care of the patient were reviewed by me and considered in my medical decision making (see chart for details).     Assessment/Plan: 62 year old male presents status post fall.  Patient cannot  provide any significant details.  He is well-appearing in no acute distress, no signs of trauma moving extremities without signs of discomfort.  Patient discharged with return precautions.  Final Clinical Impressions(s) / ED Diagnoses   Final diagnoses:  Fall, initial encounter    ED Discharge Orders    None       Francee Gentile 07/06/18 [place]    Milton Ferguson, MD 07/06/18 1321

## 2018-07-06 NOTE — ED Triage Notes (Addendum)
Pt sent from [facility] due to unwittnessed fall. Pt found in hallway. Denies pain. Reports dizziness

## 2018-07-06 NOTE — Discharge Instructions (Addendum)
Please read attached information. If you experience any new or worsening signs or symptoms please return to the emergency room for evaluation. Please follow-up with your primary care provider or specialist as discussed.  °

## 2018-07-06 NOTE — ED Notes (Signed)
[facility] here to pick up pt.

## 2018-07-06 NOTE — ED Notes (Signed)
Transportation has been called.

## 2018-11-13 ENCOUNTER — Encounter: Payer: Self-pay | Admitting: Gastroenterology

## 2019-01-29 ENCOUNTER — Ambulatory Visit: Payer: Self-pay | Admitting: Gastroenterology

## 2019-02-05 ENCOUNTER — Other Ambulatory Visit: Payer: Self-pay

## 2019-02-05 ENCOUNTER — Encounter: Payer: Self-pay | Admitting: Gastroenterology

## 2019-02-05 ENCOUNTER — Telehealth: Payer: Self-pay | Admitting: *Deleted

## 2019-02-05 ENCOUNTER — Other Ambulatory Visit: Payer: Self-pay | Admitting: *Deleted

## 2019-02-05 ENCOUNTER — Ambulatory Visit (INDEPENDENT_AMBULATORY_CARE_PROVIDER_SITE_OTHER): Payer: Medicare Other | Admitting: Gastroenterology

## 2019-02-05 DIAGNOSIS — R1310 Dysphagia, unspecified: Secondary | ICD-10-CM | POA: Diagnosis not present

## 2019-02-05 DIAGNOSIS — D126 Benign neoplasm of colon, unspecified: Secondary | ICD-10-CM

## 2019-02-05 DIAGNOSIS — R1319 Other dysphagia: Secondary | ICD-10-CM

## 2019-02-05 MED ORDER — NA SULFATE-K SULFATE-MG SULF 17.5-3.13-1.6 GM/177ML PO SOLN: 1.0000 | ORAL | 0 refills | Status: DC

## 2019-02-05 NOTE — Telephone Encounter (Signed)
Patient resides at [facility]. Hassan Rowan from transportation was with patient at [facility] today. TCS with propofol scheduled for 05/13/2019 at 10:30am. Hassan Rowan is aware patient will need to go for a pre-op appt prior. She asked for me to fax the instructions over to [facility]. Brenda's # is [phone] and she needs patient pre-op to be scheduled early AM if possible.  [employer] [employer] and spoke with Ramona. Fax # [phone]. The pharmacy they use is [facility] [phone]. I have sent Rx to them.

## 2019-02-05 NOTE — Assessment & Plan Note (Signed)
SYMPTOMS CONTROLLED/RESOLVED.  CONTINUE TO MONITOR SYMPTOMS. 

## 2019-02-05 NOTE — Telephone Encounter (Signed)
Pre-op appt scheduled for 9/2 at [facility]. I have faxed this appt with patient instructions to [facility]. Received confirmation.

## 2019-02-05 NOTE — Assessment & Plan Note (Signed)
NO WARNING SIGNS/SYMPTOMS. NO BRBPR OR MELENA.   DRINK WATER TO KEEP YOUR URINE LIGHT YELLOW. FOLLOW A HIGH FIBER DIET.  HANDOUT GIVEN. COMPLETE COLONOSCOPY(SUPREP) IN AUG OR SEP 2020.  YOU MAY BRING THE ENEMA TO ADMINISTER IN THE PREOP AREA. DISCUSSED PROCEDURE, BENEFITS, & RISKS: < 1% chance of medication reaction, bleeding, perforation, ASPIRATION, or rupture of spleen/liver requiring surgery to fix it and missed polyps < 1 cm 10-20% of the time. HALF INSULIN ON NIGHT PRIOR TO TCS. FOLLOW UP IN THE OFFICE WILL BE SCHEDULED IF NEEDED AFTER ENDOSCOPY.

## 2019-02-05 NOTE — Progress Notes (Signed)
Subjective:    Patient ID: Derrick Butler, male    DOB: 1956-02-02, 63 y.o.   MRN: [id number]  Hilbert Corrigan, MD  HPI No questions or concerns. APPETITE: OK. WEIGH STABLE. OCCASIONAL BLOW OUTS.  PT DENIES FEVER, CHILLS, HEMATOCHEZIA, HEMATEMESIS, nausea, vomiting, melena, diarrhea, CHEST PAIN, SHORTNESS OF BREATH, CHANGE IN BOWEL IN HABITS, constipation, abdominal pain, problems swallowing, problems with sedation, heartburn or indigestion.  Past Medical History:  Diagnosis Date  . Agitation    and aggression  . Altered mental status    with psychosis  . Anemia   . Aortic dissection ([place])   . Aortic dissection ([place])   . Bipolar 1 disorder ([place])   . Cardiomyopathy   . Cardiomyopathy ([place])   . CKD (chronic kidney disease)   . Coronary artery disease   . CVA (cerebral infarction)   . CVA (cerebral infarction)   . Dehydration   . Diabetes mellitus   . Dysphagia   . Encephalopathy   . Hyperglycemia   . Hyperkalemia   . Hyperlipemia   . Hypertension   . Hypertension   . Hyponatremia   . Lack of coordination   . Poor historian   . Psychosis ([place])   . Psychosis ([place])   . Thrombocytopenia ([place])   . TIA (transient ischemic attack)     Past Surgical History:  Procedure Laterality Date  . ASCENDING AORTIC ANEURYSM REPAIR    . BIOPSY  12/21/2015   Procedure: BIOPSY;  Surgeon: Danie Binder, MD;  [facility];  Service: Endoscopy;;  gastric biopsy  . COLONOSCOPY WITH PROPOFOL N/A 12/21/2015   SLF: one 24mm polyp in the ascending colon , removed with a cold biopsy forceps. resectedand retrieved.  four 6- 8 mm polyps in the rectum , in the ascending colon and in the cedum, removed with a hot snare. resected and retrieved.  . ESOPHAGOGASTRODUODENOSCOPY (EGD) WITH PROPOFOL N/A 12/21/2015   SLF: Web in the distal esophagus dilated. Erosive gastropathy, biopsied normal duodenal bulb and second portion of the duodenum.   [name] POLYPECTOMY  12/21/2015   Procedure:  POLYPECTOMY;  Surgeon: Danie Binder, MD;  [facility];  Service: Endoscopy;;  at cecum and ascending colon; rectum  . SAVORY DILATION N/A 12/21/2015   Procedure: SAVORY DILATION;  Surgeon: Danie Binder, MD;  [facility];  Service: Endoscopy;  Laterality: N/A;   No Known Allergies  Current Outpatient Medications  Medication Sig    . acetaminophen (TYLENOL) 325 MG tablet Take 650 mg by mouth every 6 (six) hours as needed for mild pain, moderate pain or fever.    [name] aspirin EC 81 MG tablet Take 81 mg by mouth daily.    [name] atorvastatin (LIPITOR) 20 MG tablet Take 20 mg by mouth daily.    [name] BIOTIN PO Take 30 mg by mouth daily.     . cholecalciferol (VITAMIN D3) 25 MCG (1000 UT) tablet Take 1,000 Units by mouth daily.    . divalproex (DEPAKOTE SPRINKLE) 125 MG capsule Take 500 mg by mouth 3 (three) times daily.     . famotidine (PEPCID) 20 MG tablet Take 20 mg by mouth 2 (two) times daily.    . fluticasone (FLONASE) 50 MCG/ACT nasal spray Place 2 sprays into the nose daily. For allergies    . Insulin Detemir (LEVEMIR FLEXPEN) 100 UNIT/ML Pen Inject 5 Units into the skin daily at 10 pm.     . labetalol (NORMODYNE) 200 MG  tablet Take 200 mg by mouth 2 (two) times daily.      [name] omega-3 acid ethyl esters (LOVAZA) 1 g capsule Take 1 g by mouth 2 (two) times daily.    . QUEtiapine (SEROQUEL) 300 MG tablet Take 300 mg by mouth 2 (two) times daily.    . risperiDONE (RISPERDAL) 1 MG tablet Take 1 tablet (1 mg total) by mouth 3 (three) times daily.    . trihexyphenidyl (ARTANE) 5 MG tablet Take 5 mg by mouth 2 (two) times daily with a meal.    . baclofen (LIORESAL) 10 MG tablet Take 10 mg by mouth 3 (three) times daily.    . Cholecalciferol 50000 units capsule Take 50,000 Units by mouth every 30 (thirty) days.    [name] QUEtiapine (SEROQUEL) 200 MG tablet Take 200 mg by mouth 2 (two) times daily.     Review of Systems PER HPI OTHERWISE ALL SYSTEMS ARE NEGATIVE.    Objective:    Physical Exam Vitals signs reviewed.  Constitutional:      General: He is not in acute distress.    Appearance: He is well-developed.  HENT:     Head: Normocephalic and atraumatic.     Mouth/Throat:     Pharynx: No oropharyngeal exudate.  Eyes:     General: No scleral icterus.    Pupils: Pupils are equal, round, and reactive to light.  Neck:     Musculoskeletal: Normal range of motion and neck supple.  Cardiovascular:     Rate and Rhythm: Normal rate and regular rhythm.     Heart sounds: Normal heart sounds.  Pulmonary:     Effort: Pulmonary effort is normal. No respiratory distress.     Breath sounds: Normal breath sounds.  Abdominal:     General: Bowel sounds are normal. There is no distension.     Palpations: Abdomen is soft.     Tenderness: There is no abdominal tenderness.  Lymphadenopathy:     Cervical: No cervical adenopathy.  Neurological:     Mental Status: He is alert. Mental status is at baseline.     Comments: NO  NEW FOCAL DEFICITS, WIDE BASED SHUFFLING GAIT.  Psychiatric:     Comments: FLAT AFFECT, NL MOOD       Assessment & Plan:

## 2019-02-05 NOTE — Patient Instructions (Signed)
DRINK WATER TO KEEP YOUR URINE LIGHT YELLOW.  FOLLOW A HIGH FIBER DIET. SEE INFO BELOW.   COMPLETE COLONOSCOPY IN AUG OR SEP 2020.  YOU MAY BRING THE ENEMA TO ADMINISTER IN THE PREOP AREA.  FOLLOW UP IN THE OFFICE WILL BE SCHEDULED IF NEEDED AFTER ENDOSCOPY.

## 2019-02-06 NOTE — Progress Notes (Signed)
CC'D TO PCP °

## 2019-02-27 ENCOUNTER — Telehealth: Payer: Self-pay | Admitting: *Deleted

## 2019-02-27 NOTE — Telephone Encounter (Signed)
April from [facility] called and said that pt has upcoming procedure on 05/13/2019 with Dr. Oneida Alar.  Pt has started Eliquis 2.5 mg twice daily.  [place] phone number:  [phone]

## 2019-03-06 NOTE — Telephone Encounter (Signed)
PT NEED APPT TO DISCUSS RISK OF STOPPING ELIQUIS FOR HIS PROCEDURE PRIOR TO COLONOSCOPY IN SEP 2020.

## 2019-03-06 NOTE — Telephone Encounter (Signed)
Pt will need ov with a provider before Sept.  Routing to [place].

## 2019-03-10 ENCOUNTER — Encounter: Payer: Self-pay | Admitting: Gastroenterology

## 2019-03-10 NOTE — Telephone Encounter (Signed)
SCHEDULED PATIENT FOR END OF July AND SENT LETTER TO CARE FACILITY

## 2019-03-11 NOTE — Telephone Encounter (Signed)
April is aware pt will need an apt. I gave her date and time and told her she will also be getting letter in the mail.

## 2019-03-13 ENCOUNTER — Telehealth: Payer: Self-pay | Admitting: Gastroenterology

## 2019-03-13 NOTE — Telephone Encounter (Signed)
Hassan Rowan from [facility] called to say the patient can't answer questions for himself and he is scheduled to come in on 04/03/2019 as a follow up prior to procedure to discuss stopping his Eliquis per SF. Hassan Rowan is wanting to know if someone from the nursing home could speak to the nurse and not bring to patient out due to covid and not being able to comprehend questions. Please advise and call Hassan Rowan at [phone].

## 2019-03-13 NOTE — Telephone Encounter (Signed)
Dr. Fields, please advise! 

## 2019-03-14 NOTE — Telephone Encounter (Signed)
PLEASE CALL PT'S FACILITY. WHY IS THE PT ON ELIQUIS?

## 2019-03-16 NOTE — Progress Notes (Signed)
Cardiology Consultation:   Patient ID: Derrick Butler MRN: [id number]; DOB: [birth year]  Admit date: (Not on file) Date of Consult: 03/18/2019  Primary Care Provider: Hilbert Corrigan, MD Primary Cardiologist: None  Primary Electrophysiologist:  None     Patient Profile:   Derrick Butler is a 63 y.o. male with a hx of dementia living at [facility]  who is being seen today for the evaluation of afib at the request of Dr Mal Amabile.  History of Present Illness:   Derrick Butler 63 y.o. with dementia living at facility history of CVA, Psychosis and agitation. History of falls seen in ER For same on 07/06/18 GI issues include previous polyp removal , savory dilatation and erosive gastropathy On statin for HLD History of aortic dissection with repair by PVT 2008 Last echo 2016 EF 60-65% trivial AR and mild MR CXR 04/13/17 normal mediastinum and aortic silhouette Last ECG 04/16/17 with LVH strain SR with PAC;s no PAF HTN Rx with labetalol 200 mg bid   Reviewed ECG from facility 02/20/19 read as afib wbut was sinus with PAC and some wenkebach HR 57 bpm ECG in our office today NSR rate 59 bpm no afib   Heart Pathway Score:     Past Medical History:  Diagnosis Date  . Agitation    and aggression  . Altered mental status    with psychosis  . Anemia   . Aortic dissection ([place])   . Aortic dissection ([place])   . Bipolar 1 disorder ([place])   . Cardiomyopathy   . Cardiomyopathy ([place])   . CKD (chronic kidney disease)   . Coronary artery disease   . CVA (cerebral infarction)   . CVA (cerebral infarction)   . Dehydration   . Diabetes mellitus   . Dysphagia   . Encephalopathy   . Hyperglycemia   . Hyperkalemia   . Hyperlipemia   . Hypertension   . Hypertension   . Hyponatremia   . Lack of coordination   . Poor historian   . Psychosis ([place])   . Psychosis ([place])   . Thrombocytopenia ([place])   . TIA (transient ischemic attack)     Past Surgical History:  Procedure  Laterality Date  . ASCENDING AORTIC ANEURYSM REPAIR    . BIOPSY  12/21/2015   Procedure: BIOPSY;  Surgeon: Danie Binder, MD;  [facility];  Service: Endoscopy;;  gastric biopsy  . COLONOSCOPY WITH PROPOFOL N/A 12/21/2015   SLF: one 69m polyp in the ascending colon , removed with a cold biopsy forceps. resectedand retrieved.  four 6- 8 mm polyps in the rectum , in the ascending colon and in the cedum, removed with a hot snare. resected and retrieved.  . ESOPHAGOGASTRODUODENOSCOPY (EGD) WITH PROPOFOL N/A 12/21/2015   SLF: Web in the distal esophagus dilated. Erosive gastropathy, biopsied normal duodenal bulb and second portion of the duodenum.   .Marland KitchenPOLYPECTOMY  12/21/2015   Procedure: POLYPECTOMY;  Surgeon: SDanie Binder MD;  [facility];  Service: Endoscopy;;  at cecum and ascending colon; rectum  . SAVORY DILATION N/A 12/21/2015   Procedure: SAVORY DILATION;  Surgeon: SDanie Binder MD;  [facility];  Service: Endoscopy;  Laterality: N/A;     Home Medications:  Prior to Admission medications   Medication Sig Start Date End Date Taking? Authorizing Provider  acetaminophen (TYLENOL) 325 MG tablet Take 650 mg by mouth every 6 (six) hours as needed for mild pain, moderate pain or  fever.    [provider]  aspirin EC 81 MG tablet Take 81 mg by mouth daily.    [provider]  atorvastatin (LIPITOR) 20 MG tablet Take 20 mg by mouth daily.    [provider]  baclofen (LIORESAL) 10 MG tablet Take 10 mg by mouth 3 (three) times daily.    [provider]  BIOTIN PO Take 30 mg by mouth daily.     [provider]  cholecalciferol (VITAMIN D3) 25 MCG (1000 UT) tablet Take 1,000 Units by mouth daily.    [provider]  Cholecalciferol 50000 units capsule Take 50,000 Units by mouth every 30 (thirty) days.    [provider]  divalproex (DEPAKOTE SPRINKLE) 125 MG capsule Take 500 mg by mouth 3 (three) times  daily.     [provider]  famotidine (PEPCID) 20 MG tablet Take 20 mg by mouth 2 (two) times daily.    [provider]  fluticasone (FLONASE) 50 MCG/ACT nasal spray Place 2 sprays into the nose daily. For allergies    [provider]  Insulin Detemir (LEVEMIR FLEXPEN) 100 UNIT/ML Pen Inject 5 Units into the skin daily at 10 pm.     [provider]  labetalol (NORMODYNE) 200 MG tablet Take 200 mg by mouth 2 (two) times daily.      [provider]  Na Sulfate-K Sulfate-Mg Sulf 17.5-3.13-1.6 GM/177ML SOLN Take 1 kit by mouth as directed. 02/05/19   Fields, Marga Melnick, MD  omega-3 acid ethyl esters (LOVAZA) 1 g capsule Take 1 g by mouth 2 (two) times daily.    [provider]  QUEtiapine (SEROQUEL) 200 MG tablet Take 200 mg by mouth 2 (two) times daily.    [provider]  QUEtiapine (SEROQUEL) 300 MG tablet Take 300 mg by mouth 2 (two) times daily.    [provider]  risperiDONE (RISPERDAL) 1 MG tablet Take 1 tablet (1 mg total) by mouth 3 (three) times daily. 06/05/13   Cloria Spring, MD  trihexyphenidyl (ARTANE) 5 MG tablet Take 5 mg by mouth 2 (two) times daily with a meal.    [provider]    Inpatient Medications: Scheduled Meds:  Continuous Infusions:  PRN Meds:   Allergies:   No Known Allergies  Social History:   Social History   Socioeconomic History  . Marital status: Single    Spouse name: Not on file  . Number of children: Not on file  . Years of education: Not on file  . Highest education level: Not on file  Occupational History  . Not on file  Social Needs  . Financial resource strain: Not on file  . Food insecurity    Worry: Not on file    Inability: Not on file  . Transportation needs    Medical: Not on file    Non-medical: Not on file  Tobacco Use  . Smoking status: Never Smoker  . Smokeless tobacco: Never Used  Substance and Sexual Activity  . Alcohol use: No    Alcohol/week:  0.0 standard drinks  . Drug use: No  . Sexual activity: Never  Lifestyle  . Physical activity    Days per week: Not on file    Minutes per session: Not on file  . Stress: Not on file  Relationships  . Social Herbalist on phone: Not on file    Gets together: Not on file    Attends religious service: Not  on file    Active member of club or organization: Not on file    Attends meetings of clubs or organizations: Not on file    Relationship status: Not on file  . Intimate partner violence    Fear of current or ex partner: Not on file    Emotionally abused: Not on file    Physically abused: Not on file    Forced sexual activity: Not on file  Other Topics Concern  . Not on file  Social History Narrative  . Not on file    Family History:    Family History  Problem Relation Age of Onset  . Alzheimer's disease Mother   . Stroke Father   . Heart disease Father   . Bipolar disorder Father   . Bipolar disorder Sister   . Bipolar disorder Sister   . Colon cancer Neg Hx      ROS:  Please see the history of present illness.   All other ROS reviewed and negative.     Physical Exam/Data:   Vitals:   03/18/19 1047  BP: 135/68  Pulse: 98  Temp: 98.2 F (36.8 C)  Weight: 165 lb (74.8 kg)  Height: 5' 6" (1.676 m)   _0 @ Last 3 Weights 03/18/2019 02/05/2019 07/06/2018  Weight (lbs) 165 lb 163 lb 12.8 oz 179 lb 14.3 oz  Weight (kg) 74.844 kg 74.299 kg 81.6 kg  Some encounter information is confidential and restricted. Go to Review Flowsheets activity to see all data.     Body mass index is 26.63 kg/m.  General:  Well nourished, well developed, in no acute distress  HEENT: normal Lymph: no adenopathy Neck: no JVD Endocrine:  No thryomegaly Vascular: No carotid bruits; FA pulses 2+ bilaterally without bruits  Cardiac:  normal S1, S2; RRR; no murmur   Lungs:  clear to auscultation bilaterally, no wheezing, rhonchi or rales  Abd: soft, nontender, no  hepatomegaly  Ext: no edema Musculoskeletal:  No deformities, BUE and BLE strength normal and equal Skin: warm and dry  Neuro:  CNs 2-12 intact, no focal abnormalities noted Psych:  Normal affect   EKG:  2018 SR LVH with strain PAC;s    Relevant CV Studies: Echo 2016   Laboratory Data:  High Sensitivity Troponin:  No results for input(s): TROPONINIHS in the last 720 hours.   Cardiac EnzymesNo results for input(s): TROPONINI in the last 168 hours. No results for input(s): TROPIPOC in the last 168 hours.  ChemistryNo results for input(s): NA, K, CL, CO2, GLUCOSE, BUN, CREATININE, CALCIUM, GFRNONAA, GFRAA, ANIONGAP in the last 168 hours.  No results for input(s): PROT, ALBUMIN, AST, ALT, ALKPHOS, BILITOT in the last 168 hours. HematologyNo results for input(s): WBC, RBC, HGB, HCT, MCV, MCH, MCHC, RDW, PLT in the last 168 hours. BNPNo results for input(s): BNP, PROBNP in the last 168 hours.  DDimer No results for input(s): DDIMER in the last 168 hours.   Radiology/Studies:  No results found.  Assessment and Plan:   1. Rhythm:  No evidence of afib some bradycardia asymptomatic. D/c Eliquis Decrease labetalol to 100 mg bid  2. HTN: Well controlled.  Continue current medications and low sodium Dash type diet.   3. HLD:  Continue statin labs with primary  4. DM:  Discussed low carb diet.  Target hemoglobin A1c is 6.5 or less.  Continue current medications. 5. Dementia:  Has been at [facility] for 10 years physically seems cared for Not very interactive    For questions  or updates, please contact [place] Please consult [email or web address] for contact info under     Signed, Jenkins Rouge, MD  03/18/2019 11:25 AM

## 2019-03-17 NOTE — Telephone Encounter (Signed)
PLEASE CALL PT'S FACILITY. WE WILL DISCUSS ELIQUIS WITH DR. Johnsie Cancel. HE MAY NEED TO [place] TCS IF DR. NISHAN WANTS HIM ANTICOAGULATED > 3 MOS PRIOR TO STOPPING ELIQUIS.

## 2019-03-17 NOTE — Telephone Encounter (Signed)
Left message with Hassan Rowan for a return call from April, the pt's nurse.

## 2019-03-17 NOTE — Telephone Encounter (Signed)
Per April at the nursing facility, pt started Eliquis on 02/24/2019 by Richardine Service, NP at facility after EKG showed a fib.  Pt has appointment with cardiology tomorrow, 03/18/2019 with Dr. Johnsie Cancel.

## 2019-03-18 ENCOUNTER — Encounter: Payer: Self-pay | Admitting: Cardiovascular Disease

## 2019-03-18 ENCOUNTER — Ambulatory Visit (INDEPENDENT_AMBULATORY_CARE_PROVIDER_SITE_OTHER): Payer: Medicare Other | Admitting: Cardiovascular Disease

## 2019-03-18 ENCOUNTER — Other Ambulatory Visit: Payer: Self-pay

## 2019-03-18 VITALS — BP 135/68 | HR 98 | Temp 98.2°F | Ht 66.0 in | Wt 165.0 lb

## 2019-03-18 DIAGNOSIS — I4891 Unspecified atrial fibrillation: Secondary | ICD-10-CM | POA: Diagnosis not present

## 2019-03-18 MED ORDER — LABETALOL HCL 100 MG PO TABS: 100.0000 mg | ORAL_TABLET | Freq: Two times a day (BID) | ORAL | 3 refills | Status: DC

## 2019-03-18 NOTE — Patient Instructions (Signed)
Medication Instructions:  STOP ELIQUIS   DECREASE LABETALOL TO 100 MG- TWO TIMES DAILY   Labwork: NONE  Testing/Procedures: NONE  Follow-Up: Your physician recommends that you schedule a follow-up appointment in: AS NEEDED    Any Other Special Instructions Will Be Listed Below (If Applicable).     If you need a refill on your cardiac medications before your next appointment, please call your pharmacy.

## 2019-03-18 NOTE — Telephone Encounter (Signed)
REVIEWED-NO ADDITIONAL RECOMMENDATIONS. 

## 2019-03-18 NOTE — Telephone Encounter (Signed)
Pt had OV with cardiologist today.

## 2019-03-18 NOTE — Telephone Encounter (Signed)
Derrick Butler is aware and will let Derrick Butler know. Pt is on way to cardioloigist appointment now.

## 2019-03-18 NOTE — Telephone Encounter (Signed)
Please advise if pt may have colonoscopy without office visit or phone visit.

## 2019-03-19 NOTE — Telephone Encounter (Signed)
Pt SEEN BY DR. Janean Sark D/C'd. TRIAGE PT FOR TCS IN SEP 2020.

## 2019-03-21 NOTE — Telephone Encounter (Signed)
I called and informed Derrick Butler at the nursing facility that pt does not need OV prior to procedure.  He will need to go to Pre-op on 9/2/ and will be told at that appointment what time to report for his procedure.  Dr. Oneida Alar, pt is already on the schedule for Sept.

## 2019-03-21 NOTE — Telephone Encounter (Signed)
REVIEWED-NO ADDITIONAL RECOMMENDATIONS. 

## 2019-04-02 ENCOUNTER — Telehealth: Payer: Self-pay

## 2019-04-02 NOTE — Telephone Encounter (Signed)
Per Manuela Schwartz, T/C from [facility] asking about pt's appointment tomorrow. I told her I was busy, would take call in a min. Manuela Schwartz said Hassan Rowan with transportation was in a hurry so she told her that I would return call. I looked up and confirmed pt does not have to come in for an office visit. I called back and told the Hassan Rowan at desk that was answering the call. She insisted that I speak to the nurse. I told her I had spoken to nurse on 03/21/2019 and informed her pt did not need office visit. She rang the nurse, got a recording and then it hung up and I could not leave a message. I called back and told Hassan Rowan to let the nurse know and she wanted to try to page her. I told her I was very busy at this time, please let nurse know pt does not need the appointment tomorrow and if she did not remember me talking to her and needs to call me, call me at her convenience.

## 2019-04-03 ENCOUNTER — Ambulatory Visit: Payer: Medicare Other | Admitting: Gastroenterology

## 2019-05-07 ENCOUNTER — Encounter (HOSPITAL_COMMUNITY)
Admission: RE | Admit: 2019-05-07 | Discharge: 2019-05-07 | Disposition: A | Discharge status: Home / Self Care | Payer: Medicare Other | Source: Ambulatory Visit | Attending: Gastroenterology | Admitting: Gastroenterology

## 2019-05-08 ENCOUNTER — Encounter (HOSPITAL_COMMUNITY): Payer: Self-pay

## 2019-05-08 ENCOUNTER — Telehealth: Payer: Self-pay

## 2019-05-08 ENCOUNTER — Other Ambulatory Visit: Payer: Self-pay

## 2019-05-08 NOTE — Telephone Encounter (Signed)
April at [facility] called office today. She had received call from [facility] that pt will need to arrive at 8:00am for TCS 05/13/19. Advised April pt would need to arrive at 8:00am for rapid COVID test d/t he stays at nursing home. April is aware a staff member or family member will need to stay with pt after COVID test until procedure. Pt will need to do all of prep the day before procedure. New procedure instructions faxed to [facility] (pt was scheduled prior to new COVID test requirement for nursing home pts). Endo scheduler aware.

## 2019-05-09 ENCOUNTER — Other Ambulatory Visit (HOSPITAL_COMMUNITY): Admission: RE | Admit: 2019-05-09 | Payer: Medicare Other | Source: Ambulatory Visit

## 2019-05-13 ENCOUNTER — Ambulatory Visit (HOSPITAL_COMMUNITY): Payer: Medicare Other | Admitting: Anesthesiology

## 2019-05-13 ENCOUNTER — Other Ambulatory Visit: Payer: Self-pay

## 2019-05-13 ENCOUNTER — Encounter (HOSPITAL_COMMUNITY): Payer: Self-pay | Admitting: *Deleted

## 2019-05-13 ENCOUNTER — Ambulatory Visit (HOSPITAL_COMMUNITY)
Admission: RE | Admit: 2019-05-13 | Discharge: 2019-05-13 | Disposition: A | Discharge status: Home / Self Care | Payer: Medicare Other | Source: Home / Self Care | Attending: Gastroenterology | Admitting: Gastroenterology

## 2019-05-13 ENCOUNTER — Other Ambulatory Visit (HOSPITAL_COMMUNITY)
Admission: RE | Admit: 2019-05-13 | Discharge: 2019-05-13 | Disposition: A | Discharge status: Home / Self Care | Payer: Medicare Other | Source: Ambulatory Visit | Attending: Gastroenterology | Admitting: Gastroenterology

## 2019-05-13 ENCOUNTER — Telehealth: Payer: Self-pay | Admitting: Gastroenterology

## 2019-05-13 ENCOUNTER — Encounter (HOSPITAL_COMMUNITY)
Admission: RE | Disposition: A | Discharge status: Home / Self Care | Payer: Self-pay | Source: Home / Self Care | Attending: Gastroenterology

## 2019-05-13 DIAGNOSIS — K91840 Postprocedural hemorrhage and hematoma of a digestive system organ or structure following a digestive system procedure: Secondary | ICD-10-CM | POA: Diagnosis not present

## 2019-05-13 DIAGNOSIS — N189 Chronic kidney disease, unspecified: Secondary | ICD-10-CM | POA: Insufficient documentation

## 2019-05-13 DIAGNOSIS — F209 Schizophrenia, unspecified: Secondary | ICD-10-CM | POA: Insufficient documentation

## 2019-05-13 DIAGNOSIS — Z8673 Personal history of transient ischemic attack (TIA), and cerebral infarction without residual deficits: Secondary | ICD-10-CM | POA: Insufficient documentation

## 2019-05-13 DIAGNOSIS — E785 Hyperlipidemia, unspecified: Secondary | ICD-10-CM | POA: Insufficient documentation

## 2019-05-13 DIAGNOSIS — Z86010 Personal history of colonic polyps: Secondary | ICD-10-CM | POA: Insufficient documentation

## 2019-05-13 DIAGNOSIS — K625 Hemorrhage of anus and rectum: Secondary | ICD-10-CM | POA: Diagnosis not present

## 2019-05-13 DIAGNOSIS — Z823 Family history of stroke: Secondary | ICD-10-CM | POA: Insufficient documentation

## 2019-05-13 DIAGNOSIS — I10 Essential (primary) hypertension: Secondary | ICD-10-CM | POA: Insufficient documentation

## 2019-05-13 DIAGNOSIS — D122 Benign neoplasm of ascending colon: Secondary | ICD-10-CM | POA: Insufficient documentation

## 2019-05-13 DIAGNOSIS — Z7982 Long term (current) use of aspirin: Secondary | ICD-10-CM | POA: Insufficient documentation

## 2019-05-13 DIAGNOSIS — E1122 Type 2 diabetes mellitus with diabetic chronic kidney disease: Secondary | ICD-10-CM | POA: Insufficient documentation

## 2019-05-13 DIAGNOSIS — K921 Melena: Secondary | ICD-10-CM | POA: Insufficient documentation

## 2019-05-13 DIAGNOSIS — Z8679 Personal history of other diseases of the circulatory system: Secondary | ICD-10-CM | POA: Insufficient documentation

## 2019-05-13 DIAGNOSIS — K644 Residual hemorrhoidal skin tags: Secondary | ICD-10-CM | POA: Insufficient documentation

## 2019-05-13 DIAGNOSIS — Z1211 Encounter for screening for malignant neoplasm of colon: Secondary | ICD-10-CM | POA: Insufficient documentation

## 2019-05-13 DIAGNOSIS — D123 Benign neoplasm of transverse colon: Secondary | ICD-10-CM | POA: Insufficient documentation

## 2019-05-13 DIAGNOSIS — D696 Thrombocytopenia, unspecified: Secondary | ICD-10-CM | POA: Insufficient documentation

## 2019-05-13 DIAGNOSIS — I2510 Atherosclerotic heart disease of native coronary artery without angina pectoris: Secondary | ICD-10-CM | POA: Insufficient documentation

## 2019-05-13 DIAGNOSIS — Z818 Family history of other mental and behavioral disorders: Secondary | ICD-10-CM | POA: Insufficient documentation

## 2019-05-13 DIAGNOSIS — Z79899 Other long term (current) drug therapy: Secondary | ICD-10-CM | POA: Insufficient documentation

## 2019-05-13 DIAGNOSIS — Z820 Family history of epilepsy and other diseases of the nervous system: Secondary | ICD-10-CM | POA: Insufficient documentation

## 2019-05-13 DIAGNOSIS — F319 Bipolar disorder, unspecified: Secondary | ICD-10-CM | POA: Insufficient documentation

## 2019-05-13 DIAGNOSIS — Z8249 Family history of ischemic heart disease and other diseases of the circulatory system: Secondary | ICD-10-CM | POA: Insufficient documentation

## 2019-05-13 DIAGNOSIS — I129 Hypertensive chronic kidney disease with stage 1 through stage 4 chronic kidney disease, or unspecified chronic kidney disease: Secondary | ICD-10-CM | POA: Insufficient documentation

## 2019-05-13 DIAGNOSIS — D649 Anemia, unspecified: Secondary | ICD-10-CM | POA: Insufficient documentation

## 2019-05-13 DIAGNOSIS — Z794 Long term (current) use of insulin: Secondary | ICD-10-CM | POA: Insufficient documentation

## 2019-05-13 DIAGNOSIS — D126 Benign neoplasm of colon, unspecified: Secondary | ICD-10-CM

## 2019-05-13 DIAGNOSIS — Z20828 Contact with and (suspected) exposure to other viral communicable diseases: Secondary | ICD-10-CM | POA: Insufficient documentation

## 2019-05-13 DIAGNOSIS — I429 Cardiomyopathy, unspecified: Secondary | ICD-10-CM | POA: Insufficient documentation

## 2019-05-13 HISTORY — PX: COLONOSCOPY: SHX174

## 2019-05-13 HISTORY — PX: BIOPSY: SHX5522

## 2019-05-13 HISTORY — PX: COLONOSCOPY WITH PROPOFOL: SHX5780

## 2019-05-13 HISTORY — PX: POLYPECTOMY: SHX5525

## 2019-05-13 LAB — BASIC METABOLIC PANEL
Anion gap: 13 (ref 5–15)
BUN: 46 mg/dL — ABNORMAL HIGH (ref 8–23)
CO2: 23 mmol/L (ref 22–32)
Calcium: 8.7 mg/dL — ABNORMAL LOW (ref 8.9–10.3)
Chloride: 107 mmol/L (ref 98–111)
Creatinine, Ser: 2.35 mg/dL — ABNORMAL HIGH (ref 0.61–1.24)
GFR calc Af Amer: 33 mL/min — ABNORMAL LOW (ref >60)
GFR calc non Af Amer: 29 mL/min — ABNORMAL LOW (ref >60)
Glucose, Bld: 73 mg/dL (ref 70–99)
Potassium: 5.2 mmol/L — ABNORMAL HIGH (ref 3.5–5.1)
Sodium: 143 mmol/L (ref 135–145)

## 2019-05-13 LAB — GLUCOSE, CAPILLARY
Glucose-Capillary: 71 mg/dL (ref 70–99)
Glucose-Capillary: 62 mg/dL — ABNORMAL LOW (ref 70–99)
Glucose-Capillary: 99 mg/dL (ref 70–99)

## 2019-05-13 LAB — SARS CORONAVIRUS 2 BY RT PCR (HOSPITAL ORDER, PERFORMED IN ~~LOC~~ HOSPITAL LAB): SARS Coronavirus 2: NEGATIVE

## 2019-05-13 SURGERY — COLONOSCOPY WITH PROPOFOL
Anesthesia: General

## 2019-05-13 MED ORDER — HYDROMORPHONE HCL 1 MG/ML IJ SOLN: 0.2500 mg | INTRAMUSCULAR | Status: DC | PRN

## 2019-05-13 MED ORDER — PROPOFOL 500 MG/50ML IV EMUL
INTRAVENOUS | Status: DC | PRN
  Administered 2019-05-13: 100 ug/kg/min via INTRAVENOUS

## 2019-05-13 MED ORDER — MIDAZOLAM HCL 2 MG/2ML IJ SOLN: 0.5000 mg | Freq: Once | INTRAMUSCULAR | Status: DC | PRN

## 2019-05-13 MED ORDER — HYDROCODONE-ACETAMINOPHEN 7.5-325 MG PO TABS: 1.0000 | ORAL_TABLET | Freq: Once | ORAL | Status: DC | PRN

## 2019-05-13 MED ORDER — PROPOFOL 10 MG/ML IV BOLUS
INTRAVENOUS | Status: AC
  Filled 2019-05-13: qty 40

## 2019-05-13 MED ORDER — PROPOFOL 10 MG/ML IV BOLUS
INTRAVENOUS | Status: DC | PRN
  Administered 2019-05-13: 20 mg via INTRAVENOUS

## 2019-05-13 MED ORDER — CHLORHEXIDINE GLUCONATE CLOTH 2 % EX PADS: 6.0000 | MEDICATED_PAD | Freq: Once | CUTANEOUS | Status: DC

## 2019-05-13 MED ORDER — LACTATED RINGERS IV SOLN
INTRAVENOUS | Status: DC
  Administered 2019-05-13: 1000 mL via INTRAVENOUS

## 2019-05-13 MED ORDER — PROMETHAZINE HCL 25 MG/ML IJ SOLN: 6.2500 mg | INTRAMUSCULAR | Status: DC | PRN

## 2019-05-13 MED ORDER — PROPOFOL 500 MG/50ML IV EMUL: INTRAVENOUS | Status: DC | PRN

## 2019-05-13 NOTE — Telephone Encounter (Signed)
Called [facility] for scheduler

## 2019-05-13 NOTE — Progress Notes (Signed)
Called nurse at [facility] and gave report to Shanon Brow. Informed him of discharge instructions and that pt's prep was poor. Dr. Oneida Alar would like to do another colonoscopy within 3 months. Patient has office visit on 10/27 at 9:30am. Hassan Rowan was called for ride but she was not available and called for an aide,Donna to come and sit with him at main entrance until she could pick him up.  Donnica Jarnagin Hermina Barters, RN

## 2019-05-13 NOTE — Transfer of Care (Signed)
Immediate Anesthesia Transfer of Care Note  Patient: Derrick Butler  Procedure(s) Performed: COLONOSCOPY WITH PROPOFOL (N/A ) POLYPECTOMY BIOPSY  Patient Location: PACU  Anesthesia Type:General  Level of Consciousness: awake  Airway & Oxygen Therapy: Patient Spontanous Breathing  Post-op Assessment: Report given to RN  Post vital signs: Reviewed  Last Vitals:  Vitals Value Taken Time  BP 123/58 05/13/19 1215  Temp    Pulse 51 05/13/19 1216  Resp 17 05/13/19 1216  SpO2 100 % 05/13/19 1216  Vitals shown include unvalidated device data.  Last Pain:  Vitals:   05/13/19 1127  PainSc: 0-No pain         Complications: No apparent anesthesia complications

## 2019-05-13 NOTE — Op Note (Signed)
[facility] Patient Name: Derrick Butler Procedure Date: 05/13/2019 11:16 AM MRN: [id number] Date of Birth: 04-25-1956 Attending MD: Barney Drain MD, MD CSN: [id number] Age: 63 Admit Type: Outpatient Procedure:                Colonoscopy WITH COLD SNARE POLYPECTOMY/POOR PREP Indications:              Personal history of colonic polyps Providers:                Barney Drain MD, MD, Otis Peak B. Sharon Seller, RN, Nelma Rothman, Technician Referring MD:             Vernell Morgans, MD Medicines:                Propofol per Anesthesia Complications:            No immediate complications. Estimated Blood Loss:     Estimated blood loss was minimal. Procedure:                Pre-Anesthesia Assessment:                           - Prior to the procedure, a History and Physical                            was performed, and patient medications and                            allergies were reviewed. The patient's tolerance of                            previous anesthesia was also reviewed. The risks                            and benefits of the procedure and the sedation                            options and risks were discussed with the patient.                            All questions were answered, and informed consent                            was obtained. Prior Anticoagulants: The patient has                            taken no previous anticoagulant or antiplatelet                            agents except for aspirin. ASA Grade Assessment: II                            - A patient with mild systemic disease. After  reviewing the risks and benefits, the patient was                            deemed in satisfactory condition to undergo the                            procedure. After obtaining informed consent, the                            colonoscope was passed under direct vision.                            Throughout the procedure, the  patient's blood                            pressure, pulse, and oxygen saturations were                            monitored continuously. The [id number]                            scope was introduced through the anus and advanced                            to the the cecum, identified by appendiceal orifice                            and ileocecal valve. The colonoscopy was                            technically difficult and complex due to poor bowel                            prep and significant looping. Successful completion                            of the procedure was aided by changing the patient                            to a supine position, using manual pressure,                            straightening and shortening the scope to obtain                            bowel loop reduction and COLOWRAP. The patient                            tolerated the procedure well. The quality of the                            bowel preparation was poor. The ileocecal valve and  the rectum were photographed. Scope In: 11:38:30 AM Scope Out: 12:05:35 PM Scope Withdrawal Time: 0 hours 20 minutes 31 seconds  Total Procedure Duration: 0 hours 27 minutes 5 seconds  Findings:      Two sessile polyps were found in the hepatic flexure and proximal       ascending colon. The polyps were 5 to 8 mm in size. These polyps were       removed with a cold snare. Resection and retrieval were complete.      Red blood was found in the rectum. Biopsies were taken with a cold       forceps for histology.      External hemorrhoids were found.      The recto-sigmoid colon, sigmoid colon, descending colon and splenic       flexure revealed significantly excessive looping. Impression:               - Preparation of the colon was poor.                           - Two 5 to 8 mm polyps at the hepatic flexure and                            in the proximal ascending colon, removed  with a                            cold snare. Resected and retrieved.                           - Blood in the rectum. Biopsied.                           - External hemorrhoids.                           - There was significant looping of the colon. Moderate Sedation:      Per Anesthesia Care Recommendation:           - Patient has a contact number available for                            emergencies. The signs and symptoms of potential                            delayed complications were discussed with the                            patient. Return to normal activities tomorrow.                            Written discharge instructions were provided to the                            patient.                           - High fiber diet.                           -  Continue present medications.                           - Await pathology results.                           - Repeat colonoscopy at next available appointment                            (within 3 months) because the bowel preparation was                            poor. Procedure Code(s):        --- Professional ---                           [phone], Colonoscopy, flexible; with removal of                            tumor(s), polyp(s), or other lesion(s) by snare                            technique                           45380, 4, Colonoscopy, flexible; with biopsy,                            single or multiple Diagnosis Code(s):        --- Professional ---                           K64.4, Residual hemorrhoidal skin tags                           K63.5, Polyp of colon                           K62.5, Hemorrhage of anus and rectum                           Z86.010, Personal history of colonic polyps CPT copyright 2019 [employer]. All rights reserved. The codes documented in this report are preliminary and upon coder review may  be revised to meet current compliance requirements. Barney Drain, MD Barney Drain MD, MD 05/13/2019 12:59:38 PM This report has been signed electronically. Number of Addenda: 0

## 2019-05-13 NOTE — Anesthesia Postprocedure Evaluation (Signed)
Anesthesia Post Note  Patient: Derrick Butler  Procedure(s) Performed: COLONOSCOPY WITH PROPOFOL (N/A ) POLYPECTOMY BIOPSY  Patient location during evaluation: PACU Anesthesia Type: General Level of consciousness: awake and alert and awake Pain management: pain level controlled Vital Signs Assessment: post-procedure vital signs reviewed and stable Respiratory status: spontaneous breathing Cardiovascular status: blood pressure returned to baseline and stable Anesthetic complications: no     Last Vitals:  Vitals:   05/13/19 1043  BP: (!) 160/79  Pulse: (!) 54  Resp: 18  Temp: 36.7 C  SpO2: 99%    Last Pain:  Vitals:   05/13/19 1127  PainSc: 0-No pain                 Franciszek Platten

## 2019-05-13 NOTE — Anesthesia Preprocedure Evaluation (Signed)
Anesthesia Evaluation  Patient identified by MRN, date of birth, ID band Patient awake    Reviewed: Allergy & Precautions, NPO status , Patient's Chart, lab work & pertinent test results, reviewed documented beta blocker date and time   Airway Mallampati: II  TM Distance: >3 FB Neck ROM: Full    Dental no notable dental hx. (+) Poor Dentition   Pulmonary pneumonia, resolved,    Pulmonary exam normal breath sounds clear to auscultation       Cardiovascular Exercise Tolerance: Good hypertension, Pt. on medications and Pt. on home beta blockers + CAD  Normal cardiovascular examII Rhythm:Regular Rate:Normal     Neuro/Psych Bipolar Disorder Schizophrenia TIACVA negative psych ROS   GI/Hepatic negative GI ROS, Neg liver ROS,   Endo/Other  negative endocrine ROSdiabetes  Renal/GU Renal InsufficiencyRenal disease  negative genitourinary   Musculoskeletal negative musculoskeletal ROS (+)   Abdominal   Peds negative pediatric ROS (+)  Hematology negative hematology ROS (+) anemia ,   Anesthesia Other Findings [facility] -here for colonoscopy -POA consented for procedure. H/o obtained from chart   Reproductive/Obstetrics negative OB ROS                             Anesthesia Physical Anesthesia Plan  ASA: III  Anesthesia Plan: General   Post-op Pain Management:    Induction: Intravenous  PONV Risk Score and Plan: 2 and Propofol infusion, TIVA and Treatment may vary due to age or medical condition  Airway Management Planned: Simple Face Mask and Nasal Cannula  Additional Equipment:   Intra-op Plan:   Post-operative Plan:   Informed Consent: I have reviewed the patients History and Physical, chart, labs and discussed the procedure including the risks, benefits and alternatives for the proposed anesthesia with the patient or authorized representative who has indicated  his/her understanding and acceptance.     Dental advisory given  Plan Discussed with: CRNA  Anesthesia Plan Comments: (Plan Full PPE use  Plan GA with GETA as needed d/w pt -WTP with same after Q&A)        Anesthesia Quick Evaluation

## 2019-05-13 NOTE — H&P (Signed)
Primary Care Physician:  Hilbert Corrigan, MD Primary Gastroenterologist:  Dr. Oneida Alar  Pre-Procedure History & Physical: HPI:  Derrick Butler is a 63 y.o. male here for  PERSONAL HISTORY OF POLYPS.  Past Medical History:  Diagnosis Date  . Agitation    and aggression  . Altered mental status    with psychosis  . Anemia   . Aortic dissection ([place])   . Aortic dissection ([place])   . Bipolar 1 disorder ([place])   . Cardiomyopathy   . Cardiomyopathy ([place])   . CKD (chronic kidney disease)   . Coronary artery disease   . CVA (cerebral infarction)   . CVA (cerebral infarction)   . Dehydration   . Diabetes mellitus   . Dysphagia   . Encephalopathy   . Hyperglycemia   . Hyperkalemia   . Hyperlipemia   . Hypertension   . Hypertension   . Hyponatremia   . Lack of coordination   . Poor historian   . Psychosis ([place])   . Psychosis ([place])   . Thrombocytopenia ([place])   . TIA (transient ischemic attack)     Past Surgical History:  Procedure Laterality Date  . ASCENDING AORTIC ANEURYSM REPAIR    . BIOPSY  12/21/2015   Procedure: BIOPSY;  Surgeon: Danie Binder, MD;  Location: [facility];  Service: Endoscopy;;  gastric biopsy  . COLONOSCOPY WITH PROPOFOL N/A 12/21/2015   SLF: one 59m polyp in the ascending colon , removed with a cold biopsy forceps. resectedand retrieved.  four 6- 8 mm polyps in the rectum , in the ascending colon and in the cedum, removed with a hot snare. resected and retrieved.  . ESOPHAGOGASTRODUODENOSCOPY (EGD) WITH PROPOFOL N/A 12/21/2015   SLF: Web in the distal esophagus dilated. Erosive gastropathy, biopsied normal duodenal bulb and second portion of the duodenum.   .Marland KitchenPOLYPECTOMY  12/21/2015   Procedure: POLYPECTOMY;  Surgeon: SDanie Binder MD;  Location: [facility];  Service: Endoscopy;;  at cecum and ascending colon; rectum  . SAVORY DILATION N/A 12/21/2015   Procedure: SAVORY DILATION;  Surgeon: SDanie Binder MD;  Location: [facility];   Service: Endoscopy;  Laterality: N/A;    Prior to Admission medications   Medication Sig Start Date End Date Taking? Authorizing Provider  acetaminophen (TYLENOL) 325 MG tablet Take 650 mg by mouth every 6 (six) hours as needed for mild pain, moderate pain or fever.   Yes [provider]  aspirin EC 81 MG tablet Take 81 mg by mouth daily. (0900)   Yes [provider]  atorvastatin (LIPITOR) 20 MG tablet Take 20 mg by mouth daily at 6 PM. (1700)   Yes [provider]  Biotin 5 MG TBDP Take 30 mg by mouth daily. (0900)   Yes [provider]  cholecalciferol (VITAMIN D3) 25 MCG (1000 UT) tablet Take 1,000 Units by mouth daily. (0900)   Yes [provider]  divalproex (DEPAKOTE SPRINKLE) 125 MG capsule Take 500 mg by mouth 3 (three) times daily. (0900, 1300, & 1700)   Yes [provider]  famotidine (PEPCID) 20 MG tablet Take 20 mg by mouth 2 (two) times daily. (0900 & 2000)   Yes [provider]  fluticasone (FLONASE) 50 MCG/ACT nasal spray Place 2 sprays into both nostrils daily. (0900)   Yes [provider]  Insulin Detemir (LEVEMIR FLEXPEN) 100 UNIT/ML Pen Inject 5 Units into the skin daily at 10 pm.    Yes [provider]  labetalol (NORMODYNE) 100 MG tablet Take 1 tablet (100 mg total) by mouth 2 (two) times daily. Patient taking differently: Take 100 mg by mouth 2 (two) times daily. (0900 & 2100) 03/18/19  Yes Josue Hector, MD  omega-3 acid ethyl esters (LOVAZA) 1 g capsule Take 1 g by mouth 2 (two) times daily. (0900 & 2100)   Yes [provider]  QUEtiapine (SEROQUEL) 300 MG tablet Take 300 mg by mouth 2 (two) times daily. (0900 & 2100)   Yes [provider]  risperiDONE (RISPERDAL) 1 MG tablet Take 1 tablet (1 mg total) by mouth 3 (three) times daily. 06/05/13  Yes Cloria Spring, MD  trihexyphenidyl (ARTANE) 5 MG tablet Take 5 mg by mouth 2 (two) times daily with a meal. (0900 & 2100)   Yes  [provider]  Na Sulfate-K Sulfate-Mg Sulf 17.5-3.13-1.6 GM/177ML SOLN Take 1 kit by mouth as directed. 02/05/19   Danie Binder, MD    Allergies as of 02/05/2019  . (No Known Allergies)    Family History  Problem Relation Age of Onset  . Alzheimer's disease Mother   . Stroke Father   . Heart disease Father   . Bipolar disorder Father   . Bipolar disorder Sister   . Bipolar disorder Sister   . Colon cancer Neg Hx     Social History   Socioeconomic History  . Marital status: Single    Spouse name: Not on file  . Number of children: Not on file  . Years of education: Not on file  . Highest education level: Not on file  Occupational History  . Not on file  Social Needs  . Financial resource strain: Not on file  . Food insecurity    Worry: Not on file    Inability: Not on file  . Transportation needs    Medical: Not on file    Non-medical: Not on file  Tobacco Use  . Smoking status: Never Smoker  . Smokeless tobacco: Never Used  Substance and Sexual Activity  . Alcohol use: No    Alcohol/week: 0.0 standard drinks  . Drug use: No  . Sexual activity: Never  Lifestyle  . Physical activity    Days per week: Not on file    Minutes per session: Not on file  . Stress: Not on file  Relationships  . Social Herbalist on phone: Not on file    Gets together: Not on file    Attends religious service: Not on file    Active member of club or organization: Not on file    Attends meetings of clubs or organizations: Not on file    Relationship status: Not on file  . Intimate partner violence    Fear of current or ex partner: Not on file    Emotionally abused: Not on file    Physically abused: Not on file    Forced sexual activity: Not on file  Other Topics Concern  . Not on file  Social History Narrative  . Not on file    Review of Systems: See HPI, otherwise negative ROS   Physical Exam: BP (!) 160/79   Pulse (!) 54   Temp 98.1 F (36.7  C)   Resp 18   SpO2 99%  General:   Alert,  pleasant and cooperative in NAD Head:  Normocephalic and atraumatic. Neck:  Supple; Lungs:  Clear throughout to auscultation.    Heart:  Regular rate  and rhythm. Abdomen:  Soft, nontender and nondistended. Normal bowel sounds, without guarding, and without rebound.   Neurologic:  Alert and  oriented x4;  grossly normal neurologically.  Impression/Plan:      PERSONAL HISTORY OF POLYPS.  PLAN: 1. TCS TODAY. DISCUSSED PROCEDURE, BENEFITS, & RISKS: < 1% chance of medication reaction, bleeding, perforation, ASPIRATION, or rupture of spleen/liver requiring surgery to fix it and missed polyps < 1 cm 10-20% of the time.

## 2019-05-13 NOTE — Discharge Instructions (Signed)
Your prep was not good. YOU had formed stool in your colon SO YOUR COLONOSCOPY EXAM IS INCOMPLETE. I removed two POLYPS.    DRINK WATER TO KEEP YOUR URINE LIGHT YELLOW.  FOLLOW A HIGH FIBER DIET. AVOID ITEMS THAT CAUSE BLOATING & GAS. SEE INFO BELOW.  USE PREPARATION H FOUR TIMES  A DAY IF NEEDED TO RELIEVE RECTAL PAIN/PRESSURE/BLEEDING.   YOUR BIOPSY RESULTS WILL BE BACK IN 5 BUSINESS DAYS.  Next colonoscopy within the next 3 mos with full liquid diet/Miralax prep/Dulcolax 2 days before and clear liquids/Trilyte 1 day before.   Colonoscopy Care After Read the instructions outlined below and refer to this sheet in the next week. These discharge instructions provide you with general information on caring for yourself after you leave the hospital. While your treatment has been planned according to the most current medical practices available, unavoidable complications occasionally occur. If you have any problems or questions after discharge, call DR. FIELDS, [phone].  ACTIVITY  You may resume your regular activity, but move at a slower pace for the next 24 hours.   Take frequent rest periods for the next 24 hours.   Walking will help get rid of the air and reduce the bloated feeling in your belly (abdomen).   No driving for 24 hours (because of the medicine (anesthesia) used during the test).   You may shower.   Do not sign any important legal documents or operate any machinery for 24 hours (because of the anesthesia used during the test).    NUTRITION  Drink plenty of fluids.   You may resume your normal diet as instructed by your doctor.   Begin with a light meal and progress to your normal diet. Heavy or fried foods are harder to digest and may make you feel sick to your stomach (nauseated).   Avoid alcoholic beverages for 24 hours or as instructed.    MEDICATIONS  You may resume your normal medications.   WHAT YOU CAN EXPECT TODAY  Some feelings of bloating  in the abdomen.   Passage of more gas than usual.   Spotting of blood in your stool or on the toilet paper  .  IF YOU HAD POLYPS REMOVED DURING THE COLONOSCOPY:  Eat a soft diet IF YOU HAVE NAUSEA, BLOATING, ABDOMINAL PAIN, OR VOMITING.    FINDING OUT THE RESULTS OF YOUR TEST Not all test results are available during your visit. DR. Oneida Alar WILL CALL YOU WITHIN 14 DAYS OF YOUR PROCEDUE WITH YOUR RESULTS. Do not assume everything is normal if you have not heard from DR. FIELDS, CALL HER OFFICE AT [phone].  SEEK IMMEDIATE MEDICAL ATTENTION AND CALL THE OFFICE: [phone] IF:  You have more than a spotting of blood in your stool.   Your belly is swollen (abdominal distention).   You are nauseated or vomiting.   You have a temperature over 101F.   You have abdominal pain or discomfort that is severe or gets worse throughout the day.   High-Fiber Diet A high-fiber diet changes your normal diet to include more whole grains, legumes, fruits, and vegetables. Changes in the diet involve replacing refined carbohydrates with unrefined foods. The calorie level of the diet is essentially unchanged. The Dietary Reference Intake (recommended amount) for adult males is 38 grams per day. For adult females, it is 25 grams per day. Pregnant and lactating women should consume 28 grams of fiber per day. Fiber is the intact part of a plant that is not broken down  during digestion. Functional fiber is fiber that has been isolated from the plant to provide a beneficial effect in the body. PURPOSE  Increase stool bulk.   Ease and regulate bowel movements.   Lower cholesterol.   REDUCE RISK OF COLON CANCER  INDICATIONS THAT YOU NEED MORE FIBER  Constipation and hemorrhoids.   Uncomplicated diverticulosis (intestine condition) and irritable bowel syndrome.   Weight management.   As a protective measure against hardening of the arteries (atherosclerosis), diabetes, and cancer.    GUIDELINES FOR INCREASING FIBER IN THE DIET  Start adding fiber to the diet slowly. A gradual increase of about 5 more grams (2 slices of whole-wheat bread, 2 servings of most fruits or vegetables, or 1 bowl of high-fiber cereal) per day is best. Too rapid an increase in fiber may result in constipation, flatulence, and bloating.   Drink enough water and fluids to keep your urine clear or pale yellow. Water, juice, or caffeine-free drinks are recommended. Not drinking enough fluid may cause constipation.   Eat a variety of high-fiber foods rather than one type of fiber.   Try to increase your intake of fiber through using high-fiber foods rather than fiber pills or supplements that contain small amounts of fiber.   The goal is to change the types of food eaten. Do not supplement your present diet with high-fiber foods, but replace foods in your present diet.   INCLUDE A VARIETY OF FIBER SOURCES  Replace refined and processed grains with whole grains, canned fruits with fresh fruits, and incorporate other fiber sources. White rice, white breads, and most bakery goods contain little or no fiber.   Brown whole-grain rice, buckwheat oats, and many fruits and vegetables are all good sources of fiber. These include: broccoli, Brussels sprouts, cabbage, cauliflower, beets, sweet potatoes, white potatoes (skin on), carrots, tomatoes, eggplant, squash, berries, fresh fruits, and dried fruits.   Cereals appear to be the richest source of fiber. Cereal fiber is found in whole grains and bran. Bran is the fiber-rich outer coat of cereal grain, which is largely removed in refining. In whole-grain cereals, the bran remains. In breakfast cereals, the largest amount of fiber is found in those with "bran" in their names. The fiber content is sometimes indicated on the label.   You may need to include additional fruits and vegetables each day.   In baking, for 1 cup white flour, you may use the following  substitutions:   1 cup whole-wheat flour minus 2 tablespoons.   1/2 cup white flour plus 1/2 cup whole-wheat flour.     Colon Polyps  Polyps are tissue growths inside the body. Polyps can grow in many places, including the large intestine (colon). A polyp may be a round bump or a mushroom-shaped growth. You could have one polyp or several. Most colon polyps are noncancerous (benign). However, some colon polyps can become cancerous over time. Finding and removing the polyps early can help prevent this. What are the causes? The exact cause of colon polyps is not known. What increases the risk? You are more likely to develop this condition if you:  Have a family history of colon cancer or colon polyps.  Are older than 53 or older than 45 if you are African American.  Have inflammatory bowel disease, such as ulcerative colitis or Crohn's disease.  Have certain hereditary conditions, such as: ? Familial adenomatous polyposis. ? Lynch syndrome. ? Turcot syndrome. ? Peutz-Jeghers syndrome.  Are overweight.  Smoke cigarettes.  Do not  get enough exercise.  Drink too much alcohol.  Eat a diet that is high in fat and red meat and low in fiber.  Had childhood cancer that was treated with abdominal radiation. What are the signs or symptoms? Most polyps do not cause symptoms. If you have symptoms, they may include:  Blood coming from your rectum when having a bowel movement.  Blood in your stool. The stool may look dark red or black.  Abdominal pain.  A change in bowel habits, such as constipation or diarrhea. How is this diagnosed? This condition is diagnosed with a colonoscopy. This is a procedure in which a lighted, flexible scope is inserted into the anus and then passed into the colon to examine the area. Polyps are sometimes found when a colonoscopy is done as part of routine cancer screening tests. How is this treated? Treatment for this condition involves removing any  polyps that are found. Most polyps can be removed during a colonoscopy. Those polyps will then be tested for cancer. Additional treatment may be needed depending on the results of testing. Follow these instructions at home: Lifestyle  Maintain a healthy weight, or lose weight if recommended by your health care provider.  Exercise every day or as told by your health care provider.  Do not use any products that contain nicotine or tobacco, such as cigarettes and e-cigarettes. If you need help quitting, ask your health care provider.  If you drink alcohol, limit how much you have: ? 0-1 drink a day for women. ? 0-2 drinks a day for men.  Be aware of how much alcohol is in your drink. In the [place] one drink equals one 12 oz bottle of beer (355 mL), one 5 oz glass of wine (148 mL), or one 1 oz shot of hard liquor (44 mL). Eating and drinking   Eat foods that are high in fiber, such as fruits, vegetables, and whole grains.  Eat foods that are high in calcium and vitamin D, such as milk, cheese, yogurt, eggs, liver, fish, and broccoli.  Limit foods that are high in fat, such as fried foods and desserts.  Limit the amount of red meat and processed meat you eat, such as hot dogs, sausage, bacon, and lunch meats. General instructions  Keep all follow-up visits as told by your health care provider. This is important. ? This includes having regularly scheduled colonoscopies. ? Talk to your health care provider about when you need a colonoscopy. Contact a health care provider if:  You have new or worsening bleeding during a bowel movement.  You have new or increased blood in your stool.  You have a change in bowel habits.  You lose weight for no known reason. Summary  Polyps are tissue growths inside the body. Polyps can grow in many places, including the colon.  Most colon polyps are noncancerous (benign), but some can become cancerous over time.  This condition is diagnosed with a  colonoscopy.  Treatment for this condition involves removing any polyps that are found. Most polyps can be removed during a colonoscopy. This information is not intended to replace advice given to you by your health care provider. Make sure you discuss any questions you have with your health care provider. Document Released: 05/17/2004 Document Revised: 12/06/2017 Document Reviewed: 12/06/2017 Elsevier Patient Education  2020 [employer].

## 2019-05-13 NOTE — Telephone Encounter (Signed)
  Next colonoscopy WITH MAC within the next 3 mos with full liquid diet/Miralax Q1H x6 WITH 4 OZ LIQUID/REPEAT 8 0Z LIQUID AFTER EACH DOSE STARTING AT 12N /Dulcolax 10 MG PO x2 _0 , 1600 2 days before and clear liquids/Trilyte 1 day before.

## 2019-05-14 ENCOUNTER — Encounter (HOSPITAL_COMMUNITY): Payer: Self-pay

## 2019-05-14 ENCOUNTER — Emergency Department (HOSPITAL_COMMUNITY): Payer: Medicare Other

## 2019-05-14 ENCOUNTER — Inpatient Hospital Stay (HOSPITAL_COMMUNITY)
Admission: EM | Admit: 2019-05-14 | Discharge: 2019-05-19 | DRG: 919 | Disposition: A | Discharge status: Hospice | Payer: Medicare Other | Source: Skilled Nursing Facility | Attending: Internal Medicine | Admitting: Internal Medicine

## 2019-05-14 ENCOUNTER — Telehealth: Payer: Self-pay | Admitting: Gastroenterology

## 2019-05-14 DIAGNOSIS — J9601 Acute respiratory failure with hypoxia: Secondary | ICD-10-CM | POA: Diagnosis present

## 2019-05-14 DIAGNOSIS — E872 Acidosis: Secondary | ICD-10-CM | POA: Diagnosis present

## 2019-05-14 DIAGNOSIS — K644 Residual hemorrhoidal skin tags: Secondary | ICD-10-CM | POA: Diagnosis present

## 2019-05-14 DIAGNOSIS — R000 Tachycardia, unspecified: Secondary | ICD-10-CM | POA: Diagnosis present

## 2019-05-14 DIAGNOSIS — Z515 Encounter for palliative care: Secondary | ICD-10-CM | POA: Diagnosis present

## 2019-05-14 DIAGNOSIS — Z951 Presence of aortocoronary bypass graft: Secondary | ICD-10-CM

## 2019-05-14 DIAGNOSIS — N183 Chronic kidney disease, stage 3 unspecified: Secondary | ICD-10-CM

## 2019-05-14 DIAGNOSIS — E869 Volume depletion, unspecified: Secondary | ICD-10-CM | POA: Diagnosis present

## 2019-05-14 DIAGNOSIS — I130 Hypertensive heart and chronic kidney disease with heart failure and stage 1 through stage 4 chronic kidney disease, or unspecified chronic kidney disease: Secondary | ICD-10-CM | POA: Diagnosis present

## 2019-05-14 DIAGNOSIS — I482 Chronic atrial fibrillation, unspecified: Secondary | ICD-10-CM | POA: Diagnosis present

## 2019-05-14 DIAGNOSIS — A419 Sepsis, unspecified organism: Secondary | ICD-10-CM | POA: Diagnosis not present

## 2019-05-14 DIAGNOSIS — Z8673 Personal history of transient ischemic attack (TIA), and cerebral infarction without residual deficits: Secondary | ICD-10-CM

## 2019-05-14 DIAGNOSIS — I351 Nonrheumatic aortic (valve) insufficiency: Secondary | ICD-10-CM | POA: Diagnosis present

## 2019-05-14 DIAGNOSIS — K91840 Postprocedural hemorrhage and hematoma of a digestive system organ or structure following a digestive system procedure: Secondary | ICD-10-CM | POA: Diagnosis present

## 2019-05-14 DIAGNOSIS — Z8679 Personal history of other diseases of the circulatory system: Secondary | ICD-10-CM

## 2019-05-14 DIAGNOSIS — J690 Pneumonitis due to inhalation of food and vomit: Secondary | ICD-10-CM | POA: Diagnosis present

## 2019-05-14 DIAGNOSIS — K625 Hemorrhage of anus and rectum: Secondary | ICD-10-CM | POA: Diagnosis present

## 2019-05-14 DIAGNOSIS — F319 Bipolar disorder, unspecified: Secondary | ICD-10-CM | POA: Diagnosis present

## 2019-05-14 DIAGNOSIS — Z79899 Other long term (current) drug therapy: Secondary | ICD-10-CM

## 2019-05-14 DIAGNOSIS — K921 Melena: Secondary | ICD-10-CM | POA: Diagnosis not present

## 2019-05-14 DIAGNOSIS — K6289 Other specified diseases of anus and rectum: Secondary | ICD-10-CM | POA: Diagnosis present

## 2019-05-14 DIAGNOSIS — I340 Nonrheumatic mitral (valve) insufficiency: Secondary | ICD-10-CM | POA: Diagnosis not present

## 2019-05-14 DIAGNOSIS — Y838 Other surgical procedures as the cause of abnormal reaction of the patient, or of later complication, without mention of misadventure at the time of the procedure: Secondary | ICD-10-CM | POA: Diagnosis present

## 2019-05-14 DIAGNOSIS — B9620 Unspecified Escherichia coli [E. coli] as the cause of diseases classified elsewhere: Secondary | ICD-10-CM | POA: Diagnosis present

## 2019-05-14 DIAGNOSIS — N179 Acute kidney failure, unspecified: Secondary | ICD-10-CM | POA: Diagnosis present

## 2019-05-14 DIAGNOSIS — I429 Cardiomyopathy, unspecified: Secondary | ICD-10-CM | POA: Diagnosis present

## 2019-05-14 DIAGNOSIS — K922 Gastrointestinal hemorrhage, unspecified: Secondary | ICD-10-CM

## 2019-05-14 DIAGNOSIS — N390 Urinary tract infection, site not specified: Secondary | ICD-10-CM | POA: Diagnosis present

## 2019-05-14 DIAGNOSIS — Z794 Long term (current) use of insulin: Secondary | ICD-10-CM

## 2019-05-14 DIAGNOSIS — R410 Disorientation, unspecified: Secondary | ICD-10-CM | POA: Diagnosis not present

## 2019-05-14 DIAGNOSIS — I5043 Acute on chronic combined systolic (congestive) and diastolic (congestive) heart failure: Secondary | ICD-10-CM | POA: Diagnosis present

## 2019-05-14 DIAGNOSIS — R4182 Altered mental status, unspecified: Secondary | ICD-10-CM | POA: Diagnosis not present

## 2019-05-14 DIAGNOSIS — D62 Acute posthemorrhagic anemia: Secondary | ICD-10-CM | POA: Diagnosis present

## 2019-05-14 DIAGNOSIS — F0390 Unspecified dementia without behavioral disturbance: Secondary | ICD-10-CM | POA: Diagnosis present

## 2019-05-14 DIAGNOSIS — I2510 Atherosclerotic heart disease of native coronary artery without angina pectoris: Secondary | ICD-10-CM | POA: Diagnosis present

## 2019-05-14 DIAGNOSIS — Z7189 Other specified counseling: Secondary | ICD-10-CM

## 2019-05-14 DIAGNOSIS — I272 Pulmonary hypertension, unspecified: Secondary | ICD-10-CM | POA: Diagnosis present

## 2019-05-14 DIAGNOSIS — Z7982 Long term (current) use of aspirin: Secondary | ICD-10-CM

## 2019-05-14 DIAGNOSIS — I712 Thoracic aortic aneurysm, without rupture: Secondary | ICD-10-CM | POA: Diagnosis present

## 2019-05-14 DIAGNOSIS — R4781 Slurred speech: Secondary | ICD-10-CM | POA: Diagnosis present

## 2019-05-14 DIAGNOSIS — Z66 Do not resuscitate: Secondary | ICD-10-CM | POA: Diagnosis present

## 2019-05-14 DIAGNOSIS — Z86010 Personal history of colonic polyps: Secondary | ICD-10-CM

## 2019-05-14 DIAGNOSIS — R0902 Hypoxemia: Secondary | ICD-10-CM

## 2019-05-14 DIAGNOSIS — S3663XA Laceration of rectum, initial encounter: Secondary | ICD-10-CM | POA: Diagnosis present

## 2019-05-14 DIAGNOSIS — R4189 Other symptoms and signs involving cognitive functions and awareness: Secondary | ICD-10-CM | POA: Diagnosis present

## 2019-05-14 DIAGNOSIS — Z20828 Contact with and (suspected) exposure to other viral communicable diseases: Secondary | ICD-10-CM | POA: Diagnosis present

## 2019-05-14 DIAGNOSIS — D696 Thrombocytopenia, unspecified: Secondary | ICD-10-CM | POA: Diagnosis present

## 2019-05-14 DIAGNOSIS — E1122 Type 2 diabetes mellitus with diabetic chronic kidney disease: Secondary | ICD-10-CM | POA: Diagnosis present

## 2019-05-14 DIAGNOSIS — Z820 Family history of epilepsy and other diseases of the nervous system: Secondary | ICD-10-CM

## 2019-05-14 DIAGNOSIS — R451 Restlessness and agitation: Secondary | ICD-10-CM | POA: Diagnosis not present

## 2019-05-14 DIAGNOSIS — E1165 Type 2 diabetes mellitus with hyperglycemia: Secondary | ICD-10-CM | POA: Diagnosis present

## 2019-05-14 DIAGNOSIS — I7102 Dissection of abdominal aorta: Secondary | ICD-10-CM | POA: Diagnosis not present

## 2019-05-14 DIAGNOSIS — E875 Hyperkalemia: Secondary | ICD-10-CM | POA: Diagnosis present

## 2019-05-14 DIAGNOSIS — D649 Anemia, unspecified: Secondary | ICD-10-CM | POA: Diagnosis present

## 2019-05-14 DIAGNOSIS — Z8249 Family history of ischemic heart disease and other diseases of the circulatory system: Secondary | ICD-10-CM

## 2019-05-14 DIAGNOSIS — Z818 Family history of other mental and behavioral disorders: Secondary | ICD-10-CM

## 2019-05-14 DIAGNOSIS — I959 Hypotension, unspecified: Secondary | ICD-10-CM | POA: Diagnosis present

## 2019-05-14 DIAGNOSIS — Z823 Family history of stroke: Secondary | ICD-10-CM

## 2019-05-14 DIAGNOSIS — E782 Mixed hyperlipidemia: Secondary | ICD-10-CM | POA: Diagnosis present

## 2019-05-14 LAB — TYPE AND SCREEN
ABO/RH(D): O POS
Antibody Screen: NEGATIVE

## 2019-05-14 LAB — CBC WITH DIFFERENTIAL/PLATELET
Abs Immature Granulocytes: 0.05 10*3/uL (ref 0.00–0.07)
Basophils Absolute: 0 10*3/uL (ref 0.0–0.1)
Basophils Relative: 0 %
Eosinophils Absolute: 0.1 10*3/uL (ref 0.0–0.5)
Eosinophils Relative: 1 %
HCT: 35.3 % — ABNORMAL LOW (ref 39.0–52.0)
Hemoglobin: 10.8 g/dL — ABNORMAL LOW (ref 13.0–17.0)
Immature Granulocytes: 1 %
Lymphocytes Relative: 48 %
Lymphs Abs: 4.4 10*3/uL — ABNORMAL HIGH (ref 0.7–4.0)
MCH: 31.9 pg (ref 26.0–34.0)
MCHC: 30.6 g/dL (ref 30.0–36.0)
MCV: 104.1 fL — ABNORMAL HIGH (ref 80.0–100.0)
Monocytes Absolute: 0.8 10*3/uL (ref 0.1–1.0)
Monocytes Relative: 8 %
Neutro Abs: 3.9 10*3/uL (ref 1.7–7.7)
Neutrophils Relative %: 42 %
Platelets: 72 10*3/uL — ABNORMAL LOW (ref 150–400)
RBC: 3.39 MIL/uL — ABNORMAL LOW (ref 4.22–5.81)
RDW: 12 % (ref 11.5–15.5)
WBC: 9.2 10*3/uL (ref 4.0–10.5)
nRBC: 0 % (ref 0.0–0.2)

## 2019-05-14 LAB — URINALYSIS, ROUTINE W REFLEX MICROSCOPIC
Bilirubin Urine: NEGATIVE
Glucose, UA: NEGATIVE mg/dL
Ketones, ur: NEGATIVE mg/dL
Nitrite: NEGATIVE
Protein, ur: 30 mg/dL — AB
Specific Gravity, Urine: 1.017 (ref 1.005–1.030)
WBC, UA: >50 WBC/hpf — ABNORMAL HIGH (ref 0–5)
pH: 5 (ref 5.0–8.0)

## 2019-05-14 LAB — COMPREHENSIVE METABOLIC PANEL
ALT: 14 U/L (ref 0–44)
AST: 37 U/L (ref 15–41)
Albumin: 3 g/dL — ABNORMAL LOW (ref 3.5–5.0)
Alkaline Phosphatase: 50 U/L (ref 38–126)
Anion gap: 19 — ABNORMAL HIGH (ref 5–15)
BUN: 44 mg/dL — ABNORMAL HIGH (ref 8–23)
CO2: 16 mmol/L — ABNORMAL LOW (ref 22–32)
Calcium: 8.4 mg/dL — ABNORMAL LOW (ref 8.9–10.3)
Chloride: 103 mmol/L (ref 98–111)
Creatinine, Ser: 2.78 mg/dL — ABNORMAL HIGH (ref 0.61–1.24)
GFR calc Af Amer: 27 mL/min — ABNORMAL LOW (ref >60)
GFR calc non Af Amer: 23 mL/min — ABNORMAL LOW (ref >60)
Glucose, Bld: 193 mg/dL — ABNORMAL HIGH (ref 70–99)
Potassium: 5.1 mmol/L (ref 3.5–5.1)
Sodium: 138 mmol/L (ref 135–145)
Total Bilirubin: 0.8 mg/dL (ref 0.3–1.2)
Total Protein: 5.3 g/dL — ABNORMAL LOW (ref 6.5–8.1)

## 2019-05-14 LAB — I-STAT CHEM 8, ED
BUN: 40 mg/dL — ABNORMAL HIGH (ref 8–23)
Calcium, Ion: 1.18 mmol/L (ref 1.15–1.40)
Chloride: 106 mmol/L (ref 98–111)
Creatinine, Ser: 2.6 mg/dL — ABNORMAL HIGH (ref 0.61–1.24)
Glucose, Bld: 177 mg/dL — ABNORMAL HIGH (ref 70–99)
HCT: 31 % — ABNORMAL LOW (ref 39.0–52.0)
Hemoglobin: 10.5 g/dL — ABNORMAL LOW (ref 13.0–17.0)
Potassium: 5.2 mmol/L — ABNORMAL HIGH (ref 3.5–5.1)
Sodium: 139 mmol/L (ref 135–145)
TCO2: 17 mmol/L — ABNORMAL LOW (ref 22–32)

## 2019-05-14 LAB — LACTIC ACID, PLASMA
Lactic Acid, Venous: >11 mmol/L (ref 0.5–1.9)
Lactic Acid, Venous: 3.3 mmol/L (ref 0.5–1.9)

## 2019-05-14 LAB — HEMATOCRIT: HCT: 30.3 % — ABNORMAL LOW (ref 39.0–52.0)

## 2019-05-14 LAB — HEMOGLOBIN: Hemoglobin: 9.7 g/dL — ABNORMAL LOW (ref 13.0–17.0)

## 2019-05-14 LAB — APTT: aPTT: 30 seconds (ref 24–36)

## 2019-05-14 LAB — GLUCOSE, CAPILLARY: Glucose-Capillary: 152 mg/dL — ABNORMAL HIGH (ref 70–99)

## 2019-05-14 LAB — POC OCCULT BLOOD, ED: Fecal Occult Bld: POSITIVE — AB

## 2019-05-14 LAB — PROTIME-INR
INR: 1.5 — ABNORMAL HIGH (ref 0.8–1.2)
Prothrombin Time: 17.6 seconds — ABNORMAL HIGH (ref 11.4–15.2)

## 2019-05-14 MED ORDER — SODIUM CHLORIDE 0.9 % IV BOLUS
1000.0000 mL | Freq: Once | INTRAVENOUS | Status: AC
  Administered 2019-05-14: 1000 mL via INTRAVENOUS

## 2019-05-14 MED ORDER — TRAMADOL HCL 50 MG PO TABS: 50.0000 mg | ORAL_TABLET | Freq: Four times a day (QID) | ORAL | Status: DC | PRN

## 2019-05-14 MED ORDER — ONDANSETRON HCL 4 MG PO TABS: 4.0000 mg | ORAL_TABLET | Freq: Four times a day (QID) | ORAL | Status: DC | PRN

## 2019-05-14 MED ORDER — INSULIN ASPART 100 UNIT/ML ~~LOC~~ SOLN
0.0000 [IU] | Freq: Three times a day (TID) | SUBCUTANEOUS | Status: DC
  Administered 2019-05-17: 09:00:00 3 [IU] via SUBCUTANEOUS

## 2019-05-14 MED ORDER — SODIUM CHLORIDE 0.9 % IV BOLUS
2000.0000 mL | Freq: Once | INTRAVENOUS | Status: AC
  Administered 2019-05-14: 18:00:00 2000 mL via INTRAVENOUS

## 2019-05-14 MED ORDER — FAMOTIDINE 20 MG PO TABS
20.0000 mg | ORAL_TABLET | Freq: Two times a day (BID) | ORAL | Status: DC
  Administered 2019-05-14 – 2019-05-16 (×3): 20 mg via ORAL
  Filled 2019-05-14 (×3): qty 1

## 2019-05-14 MED ORDER — DIVALPROEX SODIUM 125 MG PO CSDR
500.0000 mg | DELAYED_RELEASE_CAPSULE | Freq: Three times a day (TID) | ORAL | Status: DC
  Administered 2019-05-15 – 2019-05-17 (×3): 500 mg via ORAL
  Filled 2019-05-14 (×10): qty 4

## 2019-05-14 MED ORDER — OMEGA-3-ACID ETHYL ESTERS 1 G PO CAPS
1.0000 g | ORAL_CAPSULE | Freq: Two times a day (BID) | ORAL | Status: DC
  Administered 2019-05-14 – 2019-05-17 (×5): 1 g via ORAL
  Filled 2019-05-14 (×5): qty 1

## 2019-05-14 MED ORDER — RISPERIDONE 1 MG PO TABS
1.0000 mg | ORAL_TABLET | Freq: Three times a day (TID) | ORAL | Status: DC
  Administered 2019-05-14 – 2019-05-16 (×3): 1 mg via ORAL
  Filled 2019-05-14 (×5): qty 1

## 2019-05-14 MED ORDER — ACETAMINOPHEN 325 MG PO TABS: 650.0000 mg | ORAL_TABLET | Freq: Four times a day (QID) | ORAL | Status: DC | PRN

## 2019-05-14 MED ORDER — VANCOMYCIN HCL IN DEXTROSE 1-5 GM/200ML-% IV SOLN
1000.0000 mg | Freq: Once | INTRAVENOUS | Status: AC
  Administered 2019-05-14: 20:00:00 1000 mg via INTRAVENOUS
  Filled 2019-05-14: qty 200

## 2019-05-14 MED ORDER — LABETALOL HCL 200 MG PO TABS
100.0000 mg | ORAL_TABLET | Freq: Two times a day (BID) | ORAL | Status: DC
  Filled 2019-05-14: qty 1

## 2019-05-14 MED ORDER — ACETAMINOPHEN 650 MG RE SUPP: 650.0000 mg | Freq: Four times a day (QID) | RECTAL | Status: DC | PRN

## 2019-05-14 MED ORDER — ONDANSETRON HCL 4 MG/2ML IJ SOLN: 4.0000 mg | Freq: Four times a day (QID) | INTRAMUSCULAR | Status: DC | PRN

## 2019-05-14 MED ORDER — QUETIAPINE FUMARATE 100 MG PO TABS
300.0000 mg | ORAL_TABLET | Freq: Two times a day (BID) | ORAL | Status: DC
  Administered 2019-05-14 – 2019-05-16 (×4): 300 mg via ORAL
  Filled 2019-05-14 (×2): qty 3
  Filled 2019-05-14: qty 12

## 2019-05-14 MED ORDER — ATORVASTATIN CALCIUM 10 MG PO TABS
20.0000 mg | ORAL_TABLET | Freq: Every day | ORAL | Status: DC
  Administered 2019-05-15: 20 mg via ORAL
  Filled 2019-05-14: qty 1

## 2019-05-14 MED ORDER — TRIHEXYPHENIDYL HCL 5 MG PO TABS
5.0000 mg | ORAL_TABLET | Freq: Two times a day (BID) | ORAL | Status: DC
  Administered 2019-05-16 – 2019-05-17 (×3): 5 mg via ORAL
  Filled 2019-05-14 (×8): qty 1
  Filled 2019-05-14: qty 3
  Filled 2019-05-14 (×3): qty 1

## 2019-05-14 MED ORDER — PIPERACILLIN-TAZOBACTAM 3.375 G IVPB 30 MIN
3.3750 g | Freq: Once | INTRAVENOUS | Status: DC
  Filled 2019-05-14: qty 50

## 2019-05-14 MED ORDER — VITAMIN D 25 MCG (1000 UNIT) PO TABS
1000.0000 [IU] | ORAL_TABLET | Freq: Every day | ORAL | Status: DC
  Administered 2019-05-15 – 2019-05-17 (×2): 1000 [IU] via ORAL
  Filled 2019-05-14 (×2): qty 1

## 2019-05-14 MED ORDER — PANTOPRAZOLE SODIUM 40 MG IV SOLR
40.0000 mg | Freq: Two times a day (BID) | INTRAVENOUS | Status: DC
  Administered 2019-05-14 – 2019-05-15 (×2): 40 mg via INTRAVENOUS
  Filled 2019-05-14 (×2): qty 40

## 2019-05-14 NOTE — Telephone Encounter (Signed)
Pt's nurse, Varney Baas is aware results and that he needs to repeat in 3 months. Forwarding to [facility] to schedule repeat colonoscopy. Faxing this info to [facility] @ [phone].

## 2019-05-14 NOTE — ED Notes (Signed)
Pt very extremely aggressive and uncooperative . Assistance required from multiple staff members to obtain emergent lab work, urine and IV acsess

## 2019-05-14 NOTE — Telephone Encounter (Signed)
LMTCB for Derrick Butler

## 2019-05-14 NOTE — Telephone Encounter (Signed)
[employer] and spoke with [place]. She is going to have to call back with a decision regarding repeat. Will await call

## 2019-05-14 NOTE — Telephone Encounter (Signed)
See prior note

## 2019-05-14 NOTE — ED Notes (Signed)
RED blood noted in brief

## 2019-05-14 NOTE — ED Notes (Signed)
Date and time results received: 05/14/19 1935   Test: Lactic Critical Value: >11.0  Name of Provider Notified: Roderic Palau, MD

## 2019-05-14 NOTE — ED Triage Notes (Signed)
Pt brought in by [facility] pat is pale and unresponsive. Body mottled. Agonal resp. Pt had 2 polps removed yesterday and rectal bleeding progressively getting worse . BP per EMS 79/53. sats 60% and started on 6 L with sats up to 93. EDP at bedside

## 2019-05-14 NOTE — H&P (Signed)
TRH H&P    Patient Demographics:    Derrick Butler, is a 64 y.o. male  MRN: [id number]  DOB - 02-26-1956  Admit Date - 05/14/2019  Referring MD/NP/PA: Dr. Roderic Palau  Outpatient Primary MD for the patient is Mal Amabile, Anthony Sar, MD  Patient coming from: [place] [facility] [place]  Chief complaint- Short of breath   HPI:    Derrick Butler  is a 63 y.o. male, w/hx of CVA w/residual slurred speech, thrombocytopenia, HTN, HLD, DMII, CAD s/pCABG, and bipolar disorder presents to the ED from nursing home for shortness of breath. Patient is not able to give history due to slurred speech and memory loss. I personally called the NH and spoke to his nurse to gather HPI. Nurse reports that he had an "attempted" c-scope with Dr. Oneida Alar yesterday. The patient was not cleaned out enough, but two polyps were removed. He was supposed to have repeat c-scope in 3 months. Patient was fine last night: vitals were stable, no rectal bleeding, eating and drinking normally. Then today, the day nurse noticed blood per rectum while he was in the shower. The house physician evaluated him at that time. Vitals were stable, so he they elected to continue monitoring him. Later, patient complained of shortness of breath. At that time vitals were again stable. Patient was still eating and drinking per his normal habits. After that, he was found slumped over on his roommates bed. He was found to be hypotensive with O2 sats in the 60s at that time. EMS was called. They also noticed bright red blood as well as melena in his diaper at that time.   Patient's baseline - ambulatory, slurred speech that sometimes is completely garbled, and sometimes can string 3 sentences in a row that are understandable. Patient is very pale at baseline.   Ed course:  Mildly hyperkalemic at 5.2. Hyperglycemic at 177. BUN  44 - baseline 35, cr 2.78 baseline 2.35. Anion gap of 19.  GFR 23, baseline 30, lactic acid greater than 11 - repeat pending, Hgb 10.8 baseline 11.8, hct 35.3-baseline 34.5. INR 1.5. Typed and screened - antibody neg, O+. 3 Liters of fluid given. Ua suspicious for UTI. Zosyn and vanc started in ED. CXR shows cardiomegaly with vascular congestion and probably early edema CHF. Covid negative. 2 large bore IVs placed.      Review of systems:    Review of Systems  Constitutional: Negative for chills and fever.  HENT: Negative for congestion, ear discharge, ear pain and nosebleeds.   Eyes: Positive for blurred vision. Negative for double vision.  Respiratory: Positive for shortness of breath. Negative for cough and hemoptysis.   Cardiovascular: Negative for chest pain, palpitations and orthopnea.  Gastrointestinal: Positive for blood in stool and melena. Negative for abdominal pain, nausea and vomiting.  Genitourinary: Negative for dysuria.  Neurological: Negative for dizziness, tingling and headaches.  Endo/Heme/Allergies: Does not bruise/bleed easily.   It is hard to know if this ROS is accurate due to memory loss. This is piecemeal put together based on Y/N answers from patient,  and nurse report.      Past History of the following :    Past Medical History:  Diagnosis Date  . Agitation    and aggression  . Altered mental status    with psychosis  . Anemia   . Aortic dissection ([place])   . Aortic dissection ([place])   . Bipolar 1 disorder ([place])   . Cardiomyopathy   . Cardiomyopathy ([place])   . CKD (chronic kidney disease)   . Coronary artery disease   . CVA (cerebral infarction)   . CVA (cerebral infarction)   . Dehydration   . Diabetes mellitus   . Dysphagia   . Encephalopathy   . Hyperglycemia   . Hyperkalemia   . Hyperlipemia   . Hypertension   . Hypertension   . Hyponatremia   . Lack of coordination   . Poor historian   . Psychosis ([place])   . Psychosis ([place])   . Thrombocytopenia ([place])   . TIA (transient ischemic attack)        Past Surgical History:  Procedure Laterality Date  . ASCENDING AORTIC ANEURYSM REPAIR    . BIOPSY  12/21/2015   Procedure: BIOPSY;  Surgeon: Danie Binder, MD;  [facility];  Service: Endoscopy;;  gastric biopsy  . COLONOSCOPY WITH PROPOFOL N/A 12/21/2015   SLF: one 25mm polyp in the ascending colon , removed with a cold biopsy forceps. resectedand retrieved.  four 6- 8 mm polyps in the rectum , in the ascending colon and in the cedum, removed with a hot snare. resected and retrieved.  . ESOPHAGOGASTRODUODENOSCOPY (EGD) WITH PROPOFOL N/A 12/21/2015   SLF: Web in the distal esophagus dilated. Erosive gastropathy, biopsied normal duodenal bulb and second portion of the duodenum.   [name] POLYPECTOMY  12/21/2015   Procedure: POLYPECTOMY;  Surgeon: Danie Binder, MD;  [facility];  Service: Endoscopy;;  at cecum and ascending colon; rectum  . SAVORY DILATION N/A 12/21/2015   Procedure: SAVORY DILATION;  Surgeon: Danie Binder, MD;  [facility];  Service: Endoscopy;  Laterality: N/A;      Social History:      Social History   Tobacco Use  . Smoking status: Never Smoker  . Smokeless tobacco: Never Used  Substance Use Topics  . Alcohol use: No    Alcohol/week: 0.0 standard drinks       Family History :     Family History  Problem Relation Age of Onset  . Alzheimer's disease Mother   . Stroke Father   . Heart disease Father   . Bipolar disorder Father   . Bipolar disorder Sister   . Bipolar disorder Sister   . Colon cancer Neg Hx       Home Medications:   Prior to Admission medications   Medication Sig Start Date End Date Taking? Authorizing Provider  acetaminophen (TYLENOL) 325 MG tablet Take 650 mg by mouth every 6 (six) hours as needed for mild pain, moderate pain or fever.    [provider]  aspirin EC 81 MG tablet Take 81 mg by mouth daily. (0900)    [provider]  atorvastatin (LIPITOR) 20 MG tablet Take 20 mg by  mouth daily at 6 PM. (1700)    [provider]  Biotin 5 MG TBDP Take 30 mg by mouth daily. (0900)    [provider]  cholecalciferol (VITAMIN D3) 25 MCG (1000 UT) tablet Take 1,000 Units by mouth daily. (0900)  [provider]  divalproex (DEPAKOTE SPRINKLE) 125 MG capsule Take 500 mg by mouth 3 (three) times daily. (0900, 1300, & 1700)    [provider]  famotidine (PEPCID) 20 MG tablet Take 20 mg by mouth 2 (two) times daily. (0900 & 2000)    [provider]  fluticasone (FLONASE) 50 MCG/ACT nasal spray Place 2 sprays into both nostrils daily. (0900)    [provider]  Insulin Detemir (LEVEMIR FLEXPEN) 100 UNIT/ML Pen Inject 5 Units into the skin daily at 10 pm.     [provider]  labetalol (NORMODYNE) 100 MG tablet Take 1 tablet (100 mg total) by mouth 2 (two) times daily. Patient taking differently: Take 100 mg by mouth 2 (two) times daily. (0900 & 2100) 03/18/19   Josue Hector, MD  omega-3 acid ethyl esters (LOVAZA) 1 g capsule Take 1 g by mouth 2 (two) times daily. (0900 & 2100)    [provider]  QUEtiapine (SEROQUEL) 300 MG tablet Take 300 mg by mouth 2 (two) times daily. (0900 & 2100)    [provider]  risperiDONE (RISPERDAL) 1 MG tablet Take 1 tablet (1 mg total) by mouth 3 (three) times daily. 06/05/13   Cloria Spring, MD  trihexyphenidyl (ARTANE) 5 MG tablet Take 5 mg by mouth 2 (two) times daily with a meal. (0900 & 2100)    [provider]     Allergies:    No Known Allergies   Physical Exam:   Vitals  Blood pressure (!) 98/59, pulse (!) 57, temperature 100 F (37.8 C), temperature source Rectal, resp. rate 15, SpO2 95 %.  1.  General:  Lying supine in bed in NAD, VERY PALE  2. Psychiatric: Alert, and oriented to self. Pleasant and cooperative.   3. Neurologic: Slurred speech - chronic No other focal deficits on limited exam  4. HEENMT:  Atraumatic, normocephalic,  no pallor of membranes, trachea midline  5. Respiratory : LCTABL  6. Cardiovascular : HRRR, systolic murmur, midline surgical scar 7. Gastrointestinal:  Soft, nontender, nondistended  8. Skin:  Bruising on left forearm. No other acute skin changes on limited exam.   9.Musculoskeletal:  No peripheral edema    Data Review:    CBC Recent Labs  Lab 05/14/19 1756 05/14/19 1801  WBC 9.2  --   HGB 10.8* 10.5*  HCT 35.3* 31.0*  PLT 72*  --   MCV 104.1*  --   MCH 31.9  --   MCHC 30.6  --   RDW 12.0  --   LYMPHSABS 4.4*  --   MONOABS 0.8  --   EOSABS 0.1  --   BASOSABS 0.0  --    ------------------------------------------------------------------------------------------------------------------  Results for orders placed or performed during the hospital encounter of 05/14/19 (from the past 48 hour(s))  CBC with Differential/Platelet     Status: Abnormal   Collection Time: 05/14/19  5:56 PM  Result Value Ref Range   WBC 9.2 4.0 - 10.5 K/uL   RBC 3.39 (L) 4.22 - 5.81 MIL/uL   Hemoglobin 10.8 (L) 13.0 - 17.0 g/dL   HCT 35.3 (L) 39.0 - 52.0 %   MCV 104.1 (H) 80.0 - 100.0 fL   MCH 31.9 26.0 - 34.0 pg   MCHC 30.6 30.0 - 36.0 g/dL   RDW 12.0 11.5 - 15.5 %   Platelets 72 (L) 150 - 400 K/uL    Comment: PLATELET COUNT CONFIRMED BY SMEAR SPECIMEN CHECKED FOR CLOTS Immature Platelet Fraction  may be clinically indicated, consider ordering this additional test [id number]    nRBC 0.0 0.0 - 0.2 %   Neutrophils Relative % 42 %   Neutro Abs 3.9 1.7 - 7.7 K/uL   Lymphocytes Relative 48 %   Lymphs Abs 4.4 (H) 0.7 - 4.0 K/uL   Monocytes Relative 8 %   Monocytes Absolute 0.8 0.1 - 1.0 K/uL   Eosinophils Relative 1 %   Eosinophils Absolute 0.1 0.0 - 0.5 K/uL   Basophils Relative 0 %   Basophils Absolute 0.0 0.0 - 0.1 K/uL   Immature Granulocytes 1 %   Abs Immature Granulocytes 0.05 0.00 - 0.07 K/uL    Comment: Performed at [facility], [address] [place], [place] [address]   Comprehensive metabolic panel     Status: Abnormal   Collection Time: 05/14/19  5:56 PM  Result Value Ref Range   Sodium 138 135 - 145 mmol/L   Potassium 5.1 3.5 - 5.1 mmol/L   Chloride 103 98 - 111 mmol/L   CO2 16 (L) 22 - 32 mmol/L   Glucose, Bld 193 (H) 70 - 99 mg/dL   BUN 44 (H) 8 - 23 mg/dL   Creatinine, Ser 2.78 (H) 0.61 - 1.24 mg/dL   Calcium 8.4 (L) 8.9 - 10.3 mg/dL   Total Protein 5.3 (L) 6.5 - 8.1 g/dL   Albumin 3.0 (L) 3.5 - 5.0 g/dL   AST 37 15 - 41 U/L   ALT 14 0 - 44 U/L   Alkaline Phosphatase 50 38 - 126 U/L   Total Bilirubin 0.8 0.3 - 1.2 mg/dL   GFR calc non Af Amer 23 (L) >60 mL/min   GFR calc Af Amer 27 (L) >60 mL/min   Anion gap 19 (H) 5 - 15    Comment: Performed at [facility], [address] [place], [place] [address]  Type and screen     Status: None   Collection Time: 05/14/19  5:56 PM  Result Value Ref Range   ABO/RH(D) O POS    Antibody Screen NEG    Sample Expiration      05/17/2019,2359 Performed at [facility], [address], [place], [place] [address]   Lactic acid, plasma     Status: Abnormal   Collection Time: 05/14/19  5:56 PM  Result Value Ref Range   Lactic Acid, Venous >11.0 (HH) 0.5 - 1.9 mmol/L    Comment: CRITICAL RESULT CALLED TO, READ BACK BY AND VERIFIED WITH: WALKER,T ON 05/14/19 AT 1930 BY LOY,C Performed at [facility], [address] [place], [place] [address]   APTT     Status: None   Collection Time: 05/14/19  5:56 PM  Result Value Ref Range   aPTT 30 24 - 36 seconds    Comment: Performed at [facility], [address], [place], [place] [address]  Protime-INR     Status: Abnormal   Collection Time: 05/14/19  5:56 PM  Result Value Ref Range   Prothrombin Time 17.6 (H) 11.4 - 15.2 seconds   INR 1.5 (H) 0.8 - 1.2    Comment: (NOTE) INR goal varies based on device and disease states. Performed at [facility], [address] [place], [place] [address]   I-stat chem 8, ED (not at [facility] or [facility])     Status: Abnormal    Collection Time: 05/14/19  6:01 PM  Result Value Ref Range   Sodium 139 135 - 145 mmol/L   Potassium 5.2 (H) 3.5 - 5.1 mmol/L  Chloride 106 98 - 111 mmol/L   BUN 40 (H) 8 - 23 mg/dL   Creatinine, Ser 2.60 (H) 0.61 - 1.24 mg/dL   Glucose, Bld 177 (H) 70 - 99 mg/dL   Calcium, Ion 1.18 1.15 - 1.40 mmol/L   TCO2 17 (L) 22 - 32 mmol/L   Hemoglobin 10.5 (L) 13.0 - 17.0 g/dL   HCT 31.0 (L) 39.0 - 52.0 %  Urinalysis, Routine w reflex microscopic     Status: Abnormal   Collection Time: 05/14/19  6:24 PM  Result Value Ref Range   Color, Urine YELLOW YELLOW   APPearance HAZY (A) CLEAR   Specific Gravity, Urine 1.017 1.005 - 1.030   pH 5.0 5.0 - 8.0   Glucose, UA NEGATIVE NEGATIVE mg/dL   Hgb urine dipstick SMALL (A) NEGATIVE   Bilirubin Urine NEGATIVE NEGATIVE   Ketones, ur NEGATIVE NEGATIVE mg/dL   Protein, ur 30 (A) NEGATIVE mg/dL   Nitrite NEGATIVE NEGATIVE   Leukocytes,Ua MODERATE (A) NEGATIVE   RBC / HPF 0-5 0 - 5 RBC/hpf   WBC, UA >50 (H) 0 - 5 WBC/hpf   Bacteria, UA FEW (A) NONE SEEN   Squamous Epithelial / LPF 0-5 0 - 5   WBC Clumps PRESENT    Mucus PRESENT    Hyaline Casts, UA PRESENT     Comment: Performed at [facility], [address] [place], [place] [address]  POC occult blood, ED     Status: Abnormal   Collection Time: 05/14/19  6:25 PM  Result Value Ref Range   Fecal Occult Bld POSITIVE (A) NEGATIVE  Blood Culture (routine x 2)     Status: None (Preliminary result)   Collection Time: 05/14/19  7:25 PM   Specimen: BLOOD RIGHT HAND  Result Value Ref Range   Specimen Description BLOOD RIGHT HAND    Special Requests      BOTTLES DRAWN AEROBIC AND ANAEROBIC Blood Culture adequate volume Performed at [facility], [address], [place], [place] [address]    Culture PENDING    Report Status PENDING   Blood Culture (routine x 2)     Status: None (Preliminary result)   Collection Time: 05/14/19  7:25 PM   Specimen: BLOOD LEFT HAND  Result Value Ref Range   Specimen  Description BLOOD LEFT HAND    Special Requests      BOTTLES DRAWN AEROBIC AND ANAEROBIC Blood Culture adequate volume Performed at [facility], [address] [place], [place] [address]    Culture PENDING    Report Status PENDING     Chemistries  Recent Labs  Lab 05/13/19 1033 05/14/19 1756 05/14/19 1801  NA 143 138 139  K 5.2* 5.1 5.2*  CL 107 103 106  CO2 23 16*  --   GLUCOSE 73 193* 177*  BUN 46* 44* 40*  CREATININE 2.35* 2.78* 2.60*  CALCIUM 8.7* 8.4*  --   AST  --  37  --   ALT  --  14  --   ALKPHOS  --  50  --   BILITOT  --  0.8  --    ------------------------------------------------------------------------------------------------------------------  ------------------------------------------------------------------------------------------------------------------ GFR: CrCl cannot be calculated (Unknown ideal weight.). Liver Function Tests: Recent Labs  Lab 05/14/19 1756  AST 37  ALT 14  ALKPHOS 50  BILITOT 0.8  PROT 5.3*  ALBUMIN 3.0*   No results for input(s): LIPASE, AMYLASE in the last 168 hours. No results for input(s): AMMONIA in the last 168 hours. Coagulation Profile: Recent  Labs  Lab 05/14/19 1756  INR 1.5*   Cardiac Enzymes: No results for input(s): CKTOTAL, CKMB, CKMBINDEX, TROPONINI in the last 168 hours. BNP (last 3 results) No results for input(s): PROBNP in the last 8760 hours. HbA1C: No results for input(s): HGBA1C in the last 72 hours. CBG: Recent Labs  Lab 05/13/19 1036 05/13/19 1227 05/13/19 1257  GLUCAP 71 62* 99   Lipid Profile: No results for input(s): CHOL, HDL, LDLCALC, TRIG, CHOLHDL, LDLDIRECT in the last 72 hours. Thyroid Function Tests: No results for input(s): TSH, T4TOTAL, FREET4, T3FREE, THYROIDAB in the last 72 hours. Anemia Panel: No results for input(s): VITAMINB12, FOLATE, FERRITIN, TIBC, IRON, RETICCTPCT in the last 72 hours.   --------------------------------------------------------------------------------------------------------------- Urine analysis:    Component Value Date/Time   COLORURINE YELLOW 05/14/2019 1824   APPEARANCEUR HAZY (A) 05/14/2019 1824   LABSPEC 1.017 05/14/2019 1824   PHURINE 5.0 05/14/2019 1824   GLUCOSEU NEGATIVE 05/14/2019 1824   HGBUR SMALL (A) 05/14/2019 1824   BILIRUBINUR NEGATIVE 05/14/2019 1824   KETONESUR NEGATIVE 05/14/2019 1824   PROTEINUR 30 (A) 05/14/2019 1824   UROBILINOGEN 1.0 10/07/2014 1726   NITRITE NEGATIVE 05/14/2019 1824   LEUKOCYTESUR MODERATE (A) 05/14/2019 1824      Imaging Results:    Dg Chest Portable 1 View  Result Date: 05/14/2019 CLINICAL DATA:  Weakness EXAM: PORTABLE CHEST 1 VIEW COMPARISON:  04/13/2017 FINDINGS: Prior median sternotomy. Cardiomegaly with vascular congestion. Interstitial prominence likely reflects interstitial edema. No visible effusions or pneumothorax. No acute bony abnormality. IMPRESSION: Cardiomegaly with vascular congestion and probable early edema/CHF. Electronically Signed   By: Rolm Baptise M.D.   On: 05/14/2019 19:17    My personal review of EKG: Rhythm Sinus tach, Rate 100/min, QTc 540 ,no Acute ST changes   Assessment & Plan:    Active Problems:   GI bleed   1. GI bleed 1. Typed and screened in ED 2. Hgb 10.8, check HH every 4 hours 3. Start protonix 4. GI consulted - likely to have c-scope tomorrow 5. Clear liquids until midnight, then NPO 6. Hold asa and Pharm VTE prophylaxis. 2. Lactic acidosis 1. Greater than 11.  2. 3 liter bolus in ED 3. Repeat lactic acid 4. Continue hydration 3. UTI 1. UA suspicious for UTI 2. Broad spectrum antibiotics started in ED 3. Start Rocephin 4. Unclear if tachy and hypotensive is sepsis 2/2 UTI, or due to blood loss. More likely blood loss. Continue to monitor.  4. AKI 1. Continue to monitor with daily CMP 2. Continue fluid hydration 5. DMII w/hyperglycemia 1. Pt takes  5u of long acting at NH. 2. Hold long acting - start sliding scale with accuchecks.  6. Pseudo-hypocalcemia 1. Ca 8.4 2. Albumin 3.0  3. Corrected Ca is 8.8 7. HTN 1. Continue home meds 2. Continue to monitor 8. CAD 1. Continue home meds 9.    DVT Prophylaxis-   SCDs  AM Labs Ordered, also please review Full Orders  Family Communication: Admission, patients condition and plan of care including tests being ordered have been discussed with the patient.  Code Status:  Full  Admission status: Inpatient: Based on patients clinical presentation and evaluation of above clinical data, I have made determination that patient meets Inpatient criteria at this time.  Time spent in minutes : 65   Rolla Plate M.D on 05/14/2019 at 8:35 PM

## 2019-05-14 NOTE — Telephone Encounter (Signed)
Please call pt. He had TWO simple adenoma removed from hIS colon.    DRINK WATER TO KEEP YOUR URINE LIGHT YELLOW.  FOLLOW A HIGH FIBER DIET. AVOID ITEMS THAT CAUSE BLOATING & GAS.  USE PREPARATION H FOUR TIMES  A DAY IF NEEDED TO RELIEVE RECTAL PAIN/PRESSURE/BLEEDING.  Next colonoscopy within the next 3 mos.

## 2019-05-14 NOTE — ED Notes (Signed)
EDP at bedside. Pt more alert and talking

## 2019-05-14 NOTE — Telephone Encounter (Signed)
See prior note. Awaiting a call from [place] at the facility to schedule

## 2019-05-14 NOTE — Telephone Encounter (Signed)
Hassan Rowan from [facility] said she was returning a call from [place] regarding patient. Please call her back at [phone]

## 2019-05-14 NOTE — ED Provider Notes (Signed)
[facility] EMERGENCY DEPARTMENT Provider Note   CSN: [id number] Arrival date & time: 05/14/19  1748     History   Chief Complaint Chief Complaint  Patient presents with  . unresponsive    HPI Derrick Butler is a 63 y.o. male.     Patient was brought to the emergency department for confusion and rectal bleeding.  He had colonoscopy with 2 polyps removed yesterday.  Patient has a history of diabetes hypertension bipolar stroke  The history is provided by the patient and the nursing home. No language interpreter was used.  Weakness Severity:  Moderate Onset quality:  Sudden Timing:  Constant Progression:  Worsening Chronicity:  New Context: not alcohol use   Relieved by:  Nothing Worsened by:  Nothing Ineffective treatments:  None tried Associated symptoms: no abdominal pain     Past Medical History:  Diagnosis Date  . Agitation    and aggression  . Altered mental status    with psychosis  . Anemia   . Aortic dissection ([place])   . Aortic dissection ([place])   . Bipolar 1 disorder ([place])   . Cardiomyopathy   . Cardiomyopathy ([place])   . CKD (chronic kidney disease)   . Coronary artery disease   . CVA (cerebral infarction)   . CVA (cerebral infarction)   . Dehydration   . Diabetes mellitus   . Dysphagia   . Encephalopathy   . Hyperglycemia   . Hyperkalemia   . Hyperlipemia   . Hypertension   . Hypertension   . Hyponatremia   . Lack of coordination   . Poor historian   . Psychosis ([place])   . Psychosis ([place])   . Thrombocytopenia ([place])   . TIA (transient ischemic attack)     Patient Active Problem List   Diagnosis Date Noted  . GI bleed 05/14/2019  . Adenomatous colon polyp 01/26/2016  . AKI (acute kidney injury) ([place]) 01/12/2016  . DKA (diabetic ketoacidoses) ([place]) 01/09/2016  . Dysphagia 11/23/2015  . Bacteremia 11/09/2014  . Hyperkalemia 09/25/2014  . Hypoglycemia 09/25/2014  . HCAP (healthcare-associated pneumonia) 07/15/2014  . Acute  encephalopathy 07/15/2014  . Sepsis ([place]) 07/15/2014  . Thrombocytopenia ([place]) 07/14/2014  . CAP (community acquired pneumonia) 07/13/2014  . Protein-calorie malnutrition, severe ([place]) 09/09/2013  . CKD (chronic kidney disease) stage 3, GFR 30-59 ml/min (HCC) 09/09/2013  . CVA, old, cognitive deficits 09/06/2013  . UTI (lower urinary tract infection) 09/06/2013  . Dehydration 06/19/2013  . H/O: stroke 06/18/2013  . Altered mental status 06/18/2013  . Bipolar I disorder, most recent episode mixed ([place]) 06/02/2013  . CARDIOMYOPATHY OTHER DISEASES CLASSIFIED ELSW 02/21/2010  . Essential hypertension 03/12/2009  . AORTIC DISSECTION 03/12/2009    Past Surgical History:  Procedure Laterality Date  . ASCENDING AORTIC ANEURYSM REPAIR    . BIOPSY  12/21/2015   Procedure: BIOPSY;  Surgeon: Danie Binder, MD;  [facility]: [facility];  Service: Endoscopy;;  gastric biopsy  . COLONOSCOPY WITH PROPOFOL N/A 12/21/2015   SLF: one 5mm polyp in the ascending colon , removed with a cold biopsy forceps. resectedand retrieved.  four 6- 8 mm polyps in the rectum , in the ascending colon and in the cedum, removed with a hot snare. resected and retrieved.  . ESOPHAGOGASTRODUODENOSCOPY (EGD) WITH PROPOFOL N/A 12/21/2015   SLF: Web in the distal esophagus dilated. Erosive gastropathy, biopsied normal duodenal bulb and second portion of the duodenum.   [name] POLYPECTOMY  12/21/2015   Procedure: POLYPECTOMY;  Surgeon: Carlyon Prows  Rexene Edison, MD;  Location: [facility];  Service: Endoscopy;;  at cecum and ascending colon; rectum  . SAVORY DILATION N/A 12/21/2015   Procedure: SAVORY DILATION;  Surgeon: Danie Binder, MD;  Location: [facility];  Service: Endoscopy;  Laterality: N/A;        Home Medications    Prior to Admission medications   Medication Sig Start Date End Date Taking? Authorizing Provider  acetaminophen (TYLENOL) 325 MG tablet Take 650 mg by mouth every 6 (six) hours as needed for mild pain, moderate  pain or fever.    [provider]  aspirin EC 81 MG tablet Take 81 mg by mouth daily. (0900)    [provider]  atorvastatin (LIPITOR) 20 MG tablet Take 20 mg by mouth daily at 6 PM. (1700)    [provider]  Biotin 5 MG TBDP Take 30 mg by mouth daily. (0900)    [provider]  cholecalciferol (VITAMIN D3) 25 MCG (1000 UT) tablet Take 1,000 Units by mouth daily. (0900)    [provider]  divalproex (DEPAKOTE SPRINKLE) 125 MG capsule Take 500 mg by mouth 3 (three) times daily. (0900, 1300, & 1700)    [provider]  famotidine (PEPCID) 20 MG tablet Take 20 mg by mouth 2 (two) times daily. (0900 & 2000)    [provider]  fluticasone (FLONASE) 50 MCG/ACT nasal spray Place 2 sprays into both nostrils daily. (0900)    [provider]  Insulin Detemir (LEVEMIR FLEXPEN) 100 UNIT/ML Pen Inject 5 Units into the skin daily at 10 pm.     [provider]  labetalol (NORMODYNE) 100 MG tablet Take 1 tablet (100 mg total) by mouth 2 (two) times daily. Patient taking differently: Take 100 mg by mouth 2 (two) times daily. (0900 & 2100) 03/18/19   Josue Hector, MD  omega-3 acid ethyl esters (LOVAZA) 1 g capsule Take 1 g by mouth 2 (two) times daily. (0900 & 2100)    [provider]  QUEtiapine (SEROQUEL) 300 MG tablet Take 300 mg by mouth 2 (two) times daily. (0900 & 2100)    [provider]  risperiDONE (RISPERDAL) 1 MG tablet Take 1 tablet (1 mg total) by mouth 3 (three) times daily. 06/05/13   Cloria Spring, MD  trihexyphenidyl (ARTANE) 5 MG tablet Take 5 mg by mouth 2 (two) times daily with a meal. (0900 & 2100)    [provider]    Family History Family History  Problem Relation Age of Onset  . Alzheimer's disease Mother   . Stroke Father   . Heart disease Father   . Bipolar disorder Father   . Bipolar disorder Sister   . Bipolar disorder Sister   . Colon cancer Neg Hx     Social  History Social History   Tobacco Use  . Smoking status: Never Smoker  . Smokeless tobacco: Never Used  Substance Use Topics  . Alcohol use: No    Alcohol/week: 0.0 standard drinks  . Drug use: No     Allergies   Patient has no known allergies.   Review of Systems Review of Systems  Unable to perform ROS: Mental status change  Gastrointestinal: Negative for abdominal pain.  Neurological: Positive for weakness.     Physical Exam Updated Vital Signs BP (!) 98/59   Pulse (!) 57   Temp 100 F (37.8 C) (Rectal)   Resp 15   SpO2 95%   Physical Exam  Vitals signs and nursing note reviewed.  Constitutional:      Appearance: He is well-developed.     Comments: Patient lethargic  HENT:     Head: Normocephalic.     Nose: Nose normal.  Eyes:     General: No scleral icterus.    Conjunctiva/sclera: Conjunctivae normal.  Neck:     Musculoskeletal: Neck supple.     Thyroid: No thyromegaly.  Cardiovascular:     Rate and Rhythm: Normal rate and regular rhythm.     Heart sounds: No murmur. No friction rub. No gallop.   Pulmonary:     Breath sounds: No stridor. No wheezing or rales.  Chest:     Chest wall: No tenderness.  Abdominal:     General: There is no distension.     Tenderness: There is no abdominal tenderness. There is no rebound.  Genitourinary:    Comments: Bright red blood per rectum Musculoskeletal: Normal range of motion.  Lymphadenopathy:     Cervical: No cervical adenopathy.  Skin:    Findings: No erythema or rash.  Neurological:     Motor: No abnormal muscle tone.     Coordination: Coordination normal.     Comments: Oriented to person only      ED Treatments / Results  Labs (all labs ordered are listed, but only abnormal results are displayed) Labs Reviewed  CBC WITH DIFFERENTIAL/PLATELET - Abnormal; Notable for the following components:      Result Value   RBC 3.39 (*)    Hemoglobin 10.8 (*)    HCT 35.3 (*)    MCV 104.1 (*)    Platelets 72  (*)    Lymphs Abs 4.4 (*)    All other components within normal limits  COMPREHENSIVE METABOLIC PANEL - Abnormal; Notable for the following components:   CO2 16 (*)    Glucose, Bld 193 (*)    BUN 44 (*)    Creatinine, Ser 2.78 (*)    Calcium 8.4 (*)    Total Protein 5.3 (*)    Albumin 3.0 (*)    GFR calc non Af Amer 23 (*)    GFR calc Af Amer 27 (*)    Anion gap 19 (*)    All other components within normal limits  URINALYSIS, ROUTINE W REFLEX MICROSCOPIC - Abnormal; Notable for the following components:   APPearance HAZY (*)    Hgb urine dipstick SMALL (*)    Protein, ur 30 (*)    Leukocytes,Ua MODERATE (*)    WBC, UA >50 (*)    Bacteria, UA FEW (*)    All other components within normal limits  LACTIC ACID, PLASMA - Abnormal; Notable for the following components:   Lactic Acid, Venous >11.0 (*)    All other components within normal limits  PROTIME-INR - Abnormal; Notable for the following components:   Prothrombin Time 17.6 (*)    INR 1.5 (*)    All other components within normal limits  I-STAT CHEM 8, ED - Abnormal; Notable for the following components:   Potassium 5.2 (*)    BUN 40 (*)    Creatinine, Ser 2.60 (*)    Glucose, Bld 177 (*)    TCO2 17 (*)    Hemoglobin 10.5 (*)    HCT 31.0 (*)    All other components within normal limits  POC OCCULT BLOOD, ED - Abnormal; Notable for the following components:   Fecal Occult Bld POSITIVE (*)    All other components within normal limits  CULTURE, BLOOD (ROUTINE X 2)  CULTURE, BLOOD (ROUTINE X 2)  URINE CULTURE  APTT  LACTIC ACID, PLASMA  TYPE AND SCREEN    EKG None  Radiology Dg Chest Portable 1 View  Result Date: 05/14/2019 CLINICAL DATA:  Weakness EXAM: PORTABLE CHEST 1 VIEW COMPARISON:  04/13/2017 FINDINGS: Prior median sternotomy. Cardiomegaly with vascular congestion. Interstitial prominence likely reflects interstitial edema. No visible effusions or pneumothorax. No acute bony abnormality. IMPRESSION:  Cardiomegaly with vascular congestion and probable early edema/CHF. Electronically Signed   By: Rolm Baptise M.D.   On: 05/14/2019 19:17    Procedures Procedures (including critical care time)  Medications Ordered in ED Medications  piperacillin-tazobactam (ZOSYN) IVPB 3.375 g (0 g Intravenous Stopped 05/14/19 1950)  vancomycin (VANCOCIN) IVPB 1000 mg/200 mL premix (1,000 mg Intravenous New Bag/Given 05/14/19 1952)  pantoprazole (PROTONIX) injection 40 mg (has no administration in time range)  sodium chloride 0.9 % bolus 2,000 mL (0 mLs Intravenous Stopped 05/14/19 1950)  sodium chloride 0.9 % bolus 1,000 mL (1,000 mLs Intravenous New Bag/Given 05/14/19 1954)     Initial Impression / Assessment and Plan / ED Course  I have reviewed the triage vital signs and the nursing notes.  Pertinent labs & imaging results that were available during my care of the patient were reviewed by me and considered in my medical decision making (see chart for details).        CRITICAL CARE Performed by: Milton Ferguson Total critical care time: 60 minutes Critical care time was exclusive of separately billable procedures and treating other patients. Critical care was necessary to treat or prevent imminent or life-threatening deterioration. Critical care was time spent personally by me on the following activities: development of treatment plan with patient and/or surrogate as well as nursing, discussions with consultants, evaluation of patient's response to treatment, examination of patient, obtaining history from patient or surrogate, ordering and performing treatments and interventions, ordering and review of laboratory studies, ordering and review of radiographic studies, pulse oximetry and re-evaluation of patient's condition.  Labs reviewed.  Patient hemoglobin is 10.8.  Patient's blood pressure improved with IV fluids.  His mentation also improved.  Suspect rectal bleeding from colonoscopy with biopsy and  possible sepsis.  Patient is treated with fluids antibiotics and is admitted to ICU by hospitalist with GI consult.  Patient has severely elevated lactic which will be repeated Final Clinical Impressions(s) / ED Diagnoses   Final diagnoses:  Acute sepsis [facility])    ED Discharge Orders    None       Milton Ferguson, MD 05/14/19 2039

## 2019-05-14 NOTE — ED Notes (Signed)
Pt became very combative when attempting to start 2nd IV

## 2019-05-15 ENCOUNTER — Encounter (HOSPITAL_COMMUNITY)
Admission: EM | Disposition: A | Discharge status: Hospice | Payer: Self-pay | Source: Home / Self Care | Attending: Internal Medicine

## 2019-05-15 ENCOUNTER — Inpatient Hospital Stay (HOSPITAL_COMMUNITY): Payer: Medicare Other

## 2019-05-15 ENCOUNTER — Inpatient Hospital Stay (HOSPITAL_COMMUNITY): Payer: Medicare Other | Admitting: Anesthesiology

## 2019-05-15 ENCOUNTER — Encounter (HOSPITAL_COMMUNITY): Payer: Self-pay | Admitting: Gastroenterology

## 2019-05-15 DIAGNOSIS — N183 Chronic kidney disease, stage 3 (moderate): Secondary | ICD-10-CM

## 2019-05-15 DIAGNOSIS — J9601 Acute respiratory failure with hypoxia: Secondary | ICD-10-CM

## 2019-05-15 DIAGNOSIS — K625 Hemorrhage of anus and rectum: Secondary | ICD-10-CM

## 2019-05-15 DIAGNOSIS — J690 Pneumonitis due to inhalation of food and vomit: Secondary | ICD-10-CM

## 2019-05-15 DIAGNOSIS — N179 Acute kidney failure, unspecified: Secondary | ICD-10-CM

## 2019-05-15 DIAGNOSIS — K921 Melena: Secondary | ICD-10-CM

## 2019-05-15 DIAGNOSIS — I340 Nonrheumatic mitral (valve) insufficiency: Secondary | ICD-10-CM

## 2019-05-15 DIAGNOSIS — K922 Gastrointestinal hemorrhage, unspecified: Secondary | ICD-10-CM

## 2019-05-15 DIAGNOSIS — D62 Acute posthemorrhagic anemia: Secondary | ICD-10-CM

## 2019-05-15 HISTORY — PX: FLEXIBLE SIGMOIDOSCOPY: SHX5431

## 2019-05-15 LAB — CBC WITH DIFFERENTIAL/PLATELET
Abs Immature Granulocytes: 0.01 10*3/uL (ref 0.00–0.07)
Basophils Absolute: 0 10*3/uL (ref 0.0–0.1)
Basophils Relative: 0 %
Eosinophils Absolute: 0 10*3/uL (ref 0.0–0.5)
Eosinophils Relative: 1 %
HCT: 30.8 % — ABNORMAL LOW (ref 39.0–52.0)
Hemoglobin: 9.9 g/dL — ABNORMAL LOW (ref 13.0–17.0)
Immature Granulocytes: 0 %
Lymphocytes Relative: 27 %
Lymphs Abs: 1.4 10*3/uL (ref 0.7–4.0)
MCH: 31.9 pg (ref 26.0–34.0)
MCHC: 32.1 g/dL (ref 30.0–36.0)
MCV: 99.4 fL (ref 80.0–100.0)
Monocytes Absolute: 0.5 10*3/uL (ref 0.1–1.0)
Monocytes Relative: 9 %
Neutro Abs: 3.3 10*3/uL (ref 1.7–7.7)
Neutrophils Relative %: 63 %
Platelets: 49 10*3/uL — ABNORMAL LOW (ref 150–400)
RBC: 3.1 MIL/uL — ABNORMAL LOW (ref 4.22–5.81)
RDW: 12.5 % (ref 11.5–15.5)
WBC: 5.2 10*3/uL (ref 4.0–10.5)
nRBC: 0 % (ref 0.0–0.2)

## 2019-05-15 LAB — MRSA PCR SCREENING: MRSA by PCR: POSITIVE — AB

## 2019-05-15 LAB — COMPREHENSIVE METABOLIC PANEL
ALT: 14 U/L (ref 0–44)
AST: 28 U/L (ref 15–41)
Albumin: 2.5 g/dL — ABNORMAL LOW (ref 3.5–5.0)
Alkaline Phosphatase: 41 U/L (ref 38–126)
Anion gap: 7 (ref 5–15)
BUN: 43 mg/dL — ABNORMAL HIGH (ref 8–23)
CO2: 19 mmol/L — ABNORMAL LOW (ref 22–32)
Calcium: 7.4 mg/dL — ABNORMAL LOW (ref 8.9–10.3)
Chloride: 112 mmol/L — ABNORMAL HIGH (ref 98–111)
Creatinine, Ser: 2.22 mg/dL — ABNORMAL HIGH (ref 0.61–1.24)
GFR calc Af Amer: 35 mL/min — ABNORMAL LOW (ref >60)
GFR calc non Af Amer: 31 mL/min — ABNORMAL LOW (ref >60)
Glucose, Bld: 132 mg/dL — ABNORMAL HIGH (ref 70–99)
Potassium: 4.5 mmol/L (ref 3.5–5.1)
Sodium: 138 mmol/L (ref 135–145)
Total Bilirubin: 0.7 mg/dL (ref 0.3–1.2)
Total Protein: 4.4 g/dL — ABNORMAL LOW (ref 6.5–8.1)

## 2019-05-15 LAB — HEMATOCRIT
HCT: 30.6 % — ABNORMAL LOW (ref 39.0–52.0)
HCT: 30.9 % — ABNORMAL LOW (ref 39.0–52.0)

## 2019-05-15 LAB — GLUCOSE, CAPILLARY
Glucose-Capillary: 112 mg/dL — ABNORMAL HIGH (ref 70–99)
Glucose-Capillary: 123 mg/dL — ABNORMAL HIGH (ref 70–99)
Glucose-Capillary: 120 mg/dL — ABNORMAL HIGH (ref 70–99)
Glucose-Capillary: 183 mg/dL — ABNORMAL HIGH (ref 70–99)

## 2019-05-15 LAB — CBC
HCT: 28 % — ABNORMAL LOW (ref 39.0–52.0)
Hemoglobin: 9.2 g/dL — ABNORMAL LOW (ref 13.0–17.0)
MCH: 31.9 pg (ref 26.0–34.0)
MCHC: 32.9 g/dL (ref 30.0–36.0)
MCV: 97.2 fL (ref 80.0–100.0)
Platelets: 43 10*3/uL — ABNORMAL LOW (ref 150–400)
RBC: 2.88 MIL/uL — ABNORMAL LOW (ref 4.22–5.81)
RDW: 12.2 % (ref 11.5–15.5)
WBC: 4.7 10*3/uL (ref 4.0–10.5)
nRBC: 0 % (ref 0.0–0.2)

## 2019-05-15 LAB — HEMOGLOBIN
Hemoglobin: 10 g/dL — ABNORMAL LOW (ref 13.0–17.0)
Hemoglobin: 9.8 g/dL — ABNORMAL LOW (ref 13.0–17.0)

## 2019-05-15 LAB — LACTIC ACID, PLASMA: Lactic Acid, Venous: 6.6 mmol/L (ref 0.5–1.9)

## 2019-05-15 LAB — PROCALCITONIN: Procalcitonin: 0.45 ng/mL

## 2019-05-15 LAB — ECHOCARDIOGRAM COMPLETE: Weight: 2716.07 oz

## 2019-05-15 LAB — TROPONIN I (HIGH SENSITIVITY): Troponin I (High Sensitivity): 1792 ng/L (ref <18)

## 2019-05-15 LAB — BRAIN NATRIURETIC PEPTIDE: B Natriuretic Peptide: 3008 pg/mL — ABNORMAL HIGH (ref 0.0–100.0)

## 2019-05-15 SURGERY — SIGMOIDOSCOPY, FLEXIBLE
Anesthesia: General

## 2019-05-15 MED ORDER — MORPHINE SULFATE (PF) 2 MG/ML IV SOLN: 2.0000 mg | INTRAVENOUS | Status: DC | PRN

## 2019-05-15 MED ORDER — CHLORHEXIDINE GLUCONATE CLOTH 2 % EX PADS
6.0000 | MEDICATED_PAD | Freq: Every day | CUTANEOUS | Status: AC
  Administered 2019-05-15 – 2019-05-19 (×5): 6 via TOPICAL

## 2019-05-15 MED ORDER — KETAMINE HCL 10 MG/ML IJ SOLN
INTRAMUSCULAR | Status: DC | PRN
  Administered 2019-05-15 (×2): 10 mg via INTRAVENOUS

## 2019-05-15 MED ORDER — PROMETHAZINE HCL 25 MG/ML IJ SOLN: 6.2500 mg | INTRAMUSCULAR | Status: DC | PRN

## 2019-05-15 MED ORDER — PROPOFOL 500 MG/50ML IV EMUL
INTRAVENOUS | Status: DC | PRN
  Administered 2019-05-15: 14:00:00 via INTRAVENOUS
  Administered 2019-05-15: 150 ug/kg/min via INTRAVENOUS

## 2019-05-15 MED ORDER — LACTATED RINGERS IV SOLN
INTRAVENOUS | Status: DC
  Administered 2019-05-15: 13:00:00 1000 mL via INTRAVENOUS

## 2019-05-15 MED ORDER — MIDAZOLAM HCL 2 MG/2ML IJ SOLN: 0.5000 mg | Freq: Once | INTRAMUSCULAR | Status: DC | PRN

## 2019-05-15 MED ORDER — MUPIROCIN 2 % EX OINT
1.0000 "application " | TOPICAL_OINTMENT | Freq: Two times a day (BID) | CUTANEOUS | Status: DC
  Administered 2019-05-15 – 2019-05-19 (×9): 1 via NASAL
  Filled 2019-05-15 (×4): qty 22

## 2019-05-15 MED ORDER — PANTOPRAZOLE SODIUM 40 MG PO TBEC
40.0000 mg | DELAYED_RELEASE_TABLET | Freq: Every day | ORAL | Status: DC
  Administered 2019-05-15: 40 mg via ORAL
  Filled 2019-05-15 (×2): qty 1

## 2019-05-15 MED ORDER — EPINEPHRINE 1 MG/10ML IJ SOSY
PREFILLED_SYRINGE | INTRAMUSCULAR | Status: AC
  Filled 2019-05-15: qty 10

## 2019-05-15 MED ORDER — PIPERACILLIN-TAZOBACTAM 3.375 G IVPB
3.3750 g | Freq: Three times a day (TID) | INTRAVENOUS | Status: DC
  Administered 2019-05-15 – 2019-05-17 (×6): 3.375 g via INTRAVENOUS
  Filled 2019-05-15 (×5): qty 50

## 2019-05-15 MED ORDER — ZIPRASIDONE MESYLATE 20 MG IM SOLR
10.0000 mg | Freq: Once | INTRAMUSCULAR | Status: DC
  Filled 2019-05-15: qty 20

## 2019-05-15 MED ORDER — FUROSEMIDE 10 MG/ML IJ SOLN
20.0000 mg | Freq: Every day | INTRAMUSCULAR | Status: DC
  Administered 2019-05-15: 13:00:00 20 mg via INTRAVENOUS
  Filled 2019-05-15: qty 2

## 2019-05-15 MED ORDER — HYDROCODONE-ACETAMINOPHEN 7.5-325 MG PO TABS: 1.0000 | ORAL_TABLET | Freq: Once | ORAL | Status: DC | PRN

## 2019-05-15 MED ORDER — IOHEXOL 350 MG/ML SOLN
100.0000 mL | Freq: Once | INTRAVENOUS | Status: AC | PRN
  Administered 2019-05-15: 64 mL via INTRAVENOUS

## 2019-05-15 MED ORDER — EPHEDRINE SULFATE 50 MG/ML IJ SOLN
INTRAMUSCULAR | Status: DC | PRN
  Administered 2019-05-15 (×2): 10 mg via INTRAVENOUS

## 2019-05-15 MED ORDER — KETAMINE HCL 50 MG/5ML IJ SOSY
PREFILLED_SYRINGE | INTRAMUSCULAR | Status: AC
  Filled 2019-05-15: qty 5

## 2019-05-15 MED ORDER — HEPARIN (PORCINE) 25000 UT/250ML-% IV SOLN
1000.0000 [IU]/h | INTRAVENOUS | Status: DC
  Administered 2019-05-16: 1000 [IU]/h via INTRAVENOUS
  Filled 2019-05-15: qty 250

## 2019-05-15 MED ORDER — HYDROMORPHONE HCL 1 MG/ML IJ SOLN: 0.2500 mg | INTRAMUSCULAR | Status: DC | PRN

## 2019-05-15 MED ORDER — HALOPERIDOL LACTATE 5 MG/ML IJ SOLN
1.0000 mg | Freq: Four times a day (QID) | INTRAMUSCULAR | Status: DC | PRN
  Administered 2019-05-15: 1 mg via INTRAVENOUS
  Filled 2019-05-15: qty 1

## 2019-05-15 MED ORDER — SODIUM CHLORIDE (PF) 0.9 % IJ SOLN
PREFILLED_SYRINGE | INTRAMUSCULAR | Status: DC | PRN
  Administered 2019-05-15: 2 mL

## 2019-05-15 MED ORDER — SODIUM CHLORIDE 0.9 % IV SOLN: INTRAVENOUS | Status: DC

## 2019-05-15 MED ORDER — PROPOFOL 10 MG/ML IV BOLUS
INTRAVENOUS | Status: AC
  Filled 2019-05-15: qty 40

## 2019-05-15 MED ORDER — HALOPERIDOL LACTATE 5 MG/ML IJ SOLN: 1.0000 mg | Freq: Once | INTRAMUSCULAR | Status: DC

## 2019-05-15 MED ORDER — HALOPERIDOL LACTATE 5 MG/ML IJ SOLN
2.0000 mg | Freq: Four times a day (QID) | INTRAMUSCULAR | Status: DC | PRN
  Administered 2019-05-15: 2 mg via INTRAVENOUS
  Filled 2019-05-15: qty 1

## 2019-05-15 MED ORDER — PHENYLEPHRINE HCL (PRESSORS) 10 MG/ML IV SOLN
INTRAVENOUS | Status: DC | PRN
  Administered 2019-05-15: 80 ug via INTRAVENOUS

## 2019-05-15 MED ORDER — HEPARIN BOLUS VIA INFUSION
4000.0000 [IU] | Freq: Once | INTRAVENOUS | Status: AC
  Administered 2019-05-15: 4000 [IU] via INTRAVENOUS
  Filled 2019-05-15: qty 4000

## 2019-05-15 NOTE — Op Note (Addendum)
[facility] Patient Name: Derrick Butler Procedure Date: 05/15/2019 12:51 PM MRN: [id number] Date of Birth: July 11, 1956 Attending MD: Barney Drain MD, MD CSN: [id number] Age: 63 Admit Type: Inpatient Procedure:                Flexible Sigmoidoscopy-CONTROL BLEEDING Indications:              Hematochezia-TCS SEP 7: 2 SIMPLE ADENOMAS REMOVED,                            RECTAL TEAR/ULCER LIKELY DUE TO ENEMA TRAUMA.                            PRESENTED TO ED WITH RECTAL BLEEDING AND                            HYPOTENSION. Hb ON ED ADMISSION 10.5-10.8. TODAY                            SEP 10-9.2. Providers:                Barney Drain MD, MD, Janeece Riggers, RN, Nelma Rothman,                            Technician Referring MD:             Vernell Morgans Medicines:                Propofol per Anesthesia Complications:            No immediate complications. Estimated Blood Loss:     Estimated blood loss: none. Procedure:                Pre-Anesthesia Assessment:                           - Prior to the procedure, a History and Physical                            was performed, and patient medications and                            allergies were reviewed. The patient's tolerance of                            previous anesthesia was also reviewed. The risks                            and benefits of the procedure and the sedation                            options and risks were discussed with the patient.                            All questions were answered, and informed consent  was obtained. Prior Anticoagulants: The patient has                            taken no previous anticoagulant or antiplatelet                            agents except for aspirin. ASA Grade Assessment:                            III - A patient with severe systemic disease. After                            reviewing the risks and benefits, the patient was                            deemed  in satisfactory condition to undergo the                            procedure. After obtaining informed consent, the                            scope was passed under direct vision. The [id number]                            [id number]) scope was introduced through the anus and                            advanced to the the splenic flexure. The flexible                            sigmoidoscopy was performed with difficulty due to                            poor endoscopic visualization. Successful                            completion of the procedure was aided by lavage.                            The patient tolerated the procedure well. The                            quality of the bowel preparation was fair. Scope In: 2:03:47 PM Scope Out: 2:22:44 PM Total Procedure Duration: 0 hours 18 minutes 57 seconds  Findings:      LARGE CLOT WITH Red blood AT THE BASE was found in the rectum. OLD BLOOD       AND FRESH BLOOD WERE FOUND IN THE recto-sigmoid colon. For hemostasis,       two hemostatic clips were successfully placed (MR conditional). There       was no bleeding at the end of the procedure. Area was successfully       injected with 2 mL of a 1:10,000 solution of epinephrine for hemostasis.      DARK FORMED  Stool was found in the sigmoid colon, in the descending       colon and at the splenic flexure, interfering with visualization. Impression:               - Preparation of the colon was fair.                           - RECTAL BLEEDING MOST LIKELY DUE TO LESION IN                            RECTUM, LESS LIKELY PROXIMAL POLYPECTOMY SITES.                            THIS LESION COULD ONLY CAUSE HYPOTENSION 2o TO                            VASOVAGAL SYNCOPE.                           - Stool in the sigmoid colon, in the descending                            colon and at the splenic flexure.                           . Moderate Sedation:      Per Anesthesia Care Recommendation:            - Return patient to hospital ward for ongoing care.                           - Advance diet as tolerated.                           - Continue present medications.                           - CBC THIS AFTERNOON.                           - DAILY PPI                           - HOLD ASPIRIN                           - IF REBLEEDS, CONSIDER REPEAT FLEX SIG OR SURGERY                            CONSULT TO OVERSEW ULCER. Procedure Code(s):        --- Professional ---                           [phone], Sigmoidoscopy, flexible; with control of                            bleeding, any method Diagnosis Code(s):        ---  Professional ---                           K62.5, Hemorrhage of anus and rectum                           K92.2, Gastrointestinal hemorrhage, unspecified                           K92.1, Melena (includes Hematochezia) CPT copyright 2019 American Medical Association. All rights reserved. The codes documented in this report are preliminary and upon coder review may  be revised to meet current compliance requirements. Barney Drain, MD Barney Drain MD, MD 05/15/2019 2:50:45 PM This report has been signed electronically. Number of Addenda: 0

## 2019-05-15 NOTE — TOC Initial Note (Signed)
Transition of Care [facility]) - Initial/Assessment Note    Patient Details  Name: Derrick Butler MRN: [id number] Date of Birth: 04-24-1956  Transition of Care [facility]) CM/SW Contact:    Ihor Gully, LCSW Phone Number: 05/15/2019, 11:44 AM  Clinical Narrative:                 [name], guardian and friend, provided history. Patient has been at the for about 6-7 years. Staff completes ADLS with patient who requires cueing/assistance with bathing, dressing and grooming.  Patient feeds himself with set up assistance. He can ambulate and he uses a wheelchair at baseline. Patient is continent.  Debbie at [facility] states that he is independent for bed mobility, transfers. Plan is to return to the facility at discharge.   Expected Discharge Plan: Skilled Nursing Facility Barriers to Discharge: Continued Medical Work up   Patient Goals and CMS Choice Patient states their goals for this hospitalization and ongoing recovery are:: return to faclity      Expected Discharge Plan and Services Expected Discharge Plan: [place]       Living arrangements for the past 2 months: [place]                                      Prior Living Arrangements/Services Living arrangements for the past 2 months: [place] Lives with:: Facility Resident Patient language and need for interpreter reviewed:: Yes        Need for Family Participation in Patient Care: Yes (Comment) Care giver support system in place?: Yes (comment)   Criminal Activity/Legal Involvement Pertinent to Current Situation/Hospitalization: No - Comment as needed  Activities of Daily Living Home Assistive Devices/Equipment: None ADL Screening (condition at time of admission) Patient's cognitive ability adequate to safely complete daily activities?: No Is the patient deaf or have difficulty hearing?: No Does the patient have difficulty seeing, even when wearing  glasses/contacts?: No Does the patient have difficulty concentrating, remembering, or making decisions?: Yes Patient able to express need for assistance with ADLs?: No Does the patient have difficulty dressing or bathing?: Yes Independently performs ADLs?: No Communication: Independent Dressing (OT): Needs assistance Is this a change from baseline?: Pre-admission baseline Grooming: Needs assistance Is this a change from baseline?: Pre-admission baseline Feeding: Independent Bathing: Needs assistance Is this a change from baseline?: Pre-admission baseline Toileting: Needs assistance Is this a change from baseline?: Pre-admission baseline In/Out Bed: Needs assistance Walks in Home: Needs assistance Is this a change from baseline?: Pre-admission baseline Does the patient have difficulty walking or climbing stairs?: Yes Weakness of Legs: Both Weakness of Arms/Hands: Left  Permission Sought/Granted Permission sought to share information with : Family Supports          Permission granted to share info w Relationship: [name], legal guardian     Emotional Assessment     Affect (typically observed): Unable to Assess Orientation: : Oriented to Self Alcohol / Substance Use: Not Applicable    Admission diagnosis:  Acute sepsis [facility]) [A41.9] Patient Active Problem List   Diagnosis Date Noted  . Acute respiratory failure with hypoxia ([place]) 05/15/2019  . Acute renal failure superimposed on stage 3 chronic kidney disease ([place]) 05/15/2019  . Acute blood loss anemia 05/15/2019  . Aspiration pneumonitis ([place]) 05/15/2019  . Lower GI bleed 05/15/2019  . GI bleed 05/14/2019  . Adenomatous colon polyp 01/26/2016  . AKI (  acute kidney injury) ([place]) 01/12/2016  . DKA (diabetic ketoacidoses) ([place]) 01/09/2016  . Dysphagia 11/23/2015  . Bacteremia 11/09/2014  . Hyperkalemia 09/25/2014  . Hypoglycemia 09/25/2014  . HCAP (healthcare-associated pneumonia) 07/15/2014  . Acute  encephalopathy 07/15/2014  . Sepsis ([place]) 07/15/2014  . Thrombocytopenia ([place]) 07/14/2014  . CAP (community acquired pneumonia) 07/13/2014  . Protein-calorie malnutrition, severe ([place]) 09/09/2013  . CKD (chronic kidney disease) stage 3, GFR 30-59 ml/min (HCC) 09/09/2013  . CVA, old, cognitive deficits 09/06/2013  . UTI (lower urinary tract infection) 09/06/2013  . Dehydration 06/19/2013  . H/O: stroke 06/18/2013  . Altered mental status 06/18/2013  . Bipolar I disorder, most recent episode mixed ([place]) 06/02/2013  . CARDIOMYOPATHY OTHER DISEASES CLASSIFIED ELSW 02/21/2010  . Essential hypertension 03/12/2009  . AORTIC DISSECTION 03/12/2009   PCP:  Hilbert Corrigan, MD Pharmacy:   [facility] [place] [place] [address] Phone: [phone] Fax: [phone]     Social Determinants of Health ([place]) Interventions    Readmission Risk Interventions No flowsheet data found.

## 2019-05-15 NOTE — Progress Notes (Signed)
Pt transferred to ICU after CT scan. Pt is currently calm and alert but very pale in appearance. Critical Lactic of 6.6 and troponin 6.6. Dr. Darnell Level called to request number for critical care. Critical care advised that patient needs to be transferred to [facility] ICU. Attempted to contact patient's responsible party with no answer and unable to leave a message. Currently awaiting [facility] for transfer.

## 2019-05-15 NOTE — Progress Notes (Signed)
*  PRELIMINARY RESULTS* Echocardiogram 2D Echocardiogram has been performed.  Leavy Cella 05/15/2019, 4:56 PM

## 2019-05-15 NOTE — Transfer of Care (Signed)
Immediate Anesthesia Transfer of Care Note  Patient: Derrick Butler  Procedure(s) Performed: FLEXIBLE SIGMOIDOSCOPY (N/A )  Patient Location: PACU  Anesthesia Type:General  Level of Consciousness: awake, alert , oriented and patient cooperative  Airway & Oxygen Therapy: Patient Spontanous Breathing  Post-op Assessment: Report given to RN and Post -op Vital signs reviewed and stable  Post vital signs: Reviewed and stable  Last Vitals:  Vitals Value Taken Time  BP 90/57 05/15/19 1445  Temp 36.4 C 05/15/19 1445  Pulse 71 05/15/19 1458  Resp 25 05/15/19 1458  SpO2 99 % 05/15/19 1458  Vitals shown include unvalidated device data.  Last Pain:  Vitals:   05/15/19 1445  TempSrc:   PainSc: Asleep      Patients Stated Pain Goal: Other (Comment)(unable to tell) ([id number])  Complications: No apparent anesthesia complications

## 2019-05-15 NOTE — Progress Notes (Signed)
ANTICOAGULATION CONSULT NOTE - Initial Consult  Pharmacy Consult for heparin Indication: chest pain/ACS and atrial fibrillation  No Known Allergies  Patient Measurements: Weight: 169 lb 12.1 oz (77 kg)   Vital Signs: Temp: 98.2 F (36.8 C) (09/10 1600) Temp Source: Oral (09/10 1600) BP: 102/54 (09/10 2053) Pulse Rate: 80 (09/10 2053)  Labs: Recent Labs    05/14/19 1756 05/14/19 1801  05/15/19 0443 05/15/19 0850 05/15/19 1526 05/15/19 2058  HGB 10.8* 10.5*   < > 9.2* 10.0* 9.9*  9.8*  --   HCT 35.3* 31.0*   < > 28.0* 30.6* 30.8*  30.9*  --   PLT 72*  --   --  43*  --  49*  --   APTT 30  --   --   --   --   --   --   LABPROT 17.6*  --   --   --   --   --   --   INR 1.5*  --   --   --   --   --   --   CREATININE 2.78* 2.60*  --  2.22*  --   --   --   TROPONINIHS  --   --   --   --   --   --  1,792*   < > = values in this interval not displayed.    Estimated Creatinine Clearance: 33.7 mL/min (A) (by C-G formula based on SCr of 2.22 mg/dL (H)).   Medical History: Past Medical History:  Diagnosis Date  . Agitation    and aggression  . Altered mental status    with psychosis  . Anemia   . Aortic dissection ([place])   . Aortic dissection ([place])   . Bipolar 1 disorder ([place])   . Cardiomyopathy   . Cardiomyopathy ([place])   . CKD (chronic kidney disease)   . Coronary artery disease   . CVA (cerebral infarction)   . CVA (cerebral infarction)   . Dehydration   . Diabetes mellitus   . Dysphagia   . Encephalopathy   . Hyperglycemia   . Hyperkalemia   . Hyperlipemia   . Hypertension   . Hypertension   . Hyponatremia   . Lack of coordination   . Poor historian   . Psychosis ([place])   . Psychosis ([place])   . Thrombocytopenia ([place])   . TIA (transient ischemic attack)     Medications:  Medications Prior to Admission  Medication Sig Dispense Refill Last Dose  . acetaminophen (TYLENOL) 325 MG tablet Take 650 mg by mouth every 6 (six) hours as needed for mild pain,  moderate pain or fever.   unknown  . aspirin EC 81 MG tablet Take 81 mg by mouth daily. (0900)   05/12/2019  . atorvastatin (LIPITOR) 20 MG tablet Take 20 mg by mouth daily at 6 PM. (1700)   05/14/2019  . Biotin 5 MG TBDP Take 30 mg by mouth daily. (0900)   05/14/2019  . cholecalciferol (VITAMIN D3) 25 MCG (1000 UT) tablet Take 1,000 Units by mouth daily. (0900)   05/14/2019  . divalproex (DEPAKOTE SPRINKLE) 125 MG capsule Take 500 mg by mouth 3 (three) times daily. (0900, 1300, & 1700)   05/14/2019  . famotidine (PEPCID) 20 MG tablet Take 20 mg by mouth 2 (two) times daily. (0900 & 2000)   05/14/2019  . fluticasone (FLONASE) 50 MCG/ACT nasal spray Place 2 sprays into both nostrils daily. (0900)   05/14/2019  .  Insulin Detemir (LEVEMIR FLEXPEN) 100 UNIT/ML Pen Inject 5 Units into the skin daily at 10 pm.    05/13/2019 at 2200  . labetalol (NORMODYNE) 100 MG tablet Take 1 tablet (100 mg total) by mouth 2 (two) times daily. (Patient taking differently: Take 100 mg by mouth 2 (two) times daily. (0900 & 2100)) 180 tablet 3 05/14/2019 at 0900  . omega-3 acid ethyl esters (LOVAZA) 1 g capsule Take 1 g by mouth 2 (two) times daily. (0900 & 2100)   05/14/2019  . QUEtiapine (SEROQUEL) 300 MG tablet Take 300 mg by mouth 2 (two) times daily. (0900 & 2100)   05/14/2019  . risperiDONE (RISPERDAL) 1 MG tablet Take 1 tablet (1 mg total) by mouth 3 (three) times daily. 90 tablet 2 05/14/2019  . trihexyphenidyl (ARTANE) 5 MG tablet Take 5 mg by mouth 2 (two) times daily with a meal. (0900 & 2100)   05/14/2019    Assessment: Pharmacy consulted to dose heparin in patient with ACS/STEMI and afib.  Patient's troponin 1,792. He is not on anticoagulation prior to initiation.  Goal of Therapy:  Heparin level 0.3-0.7 units/ml Monitor platelets by anticoagulation protocol: Yes   Plan:  Give 4000 units bolus x 1 Start heparin infusion at 1000 units/hr Check anti-Xa level in 6-8 hours and daily while on heparin Continue to monitor H&H and  platelets  Revonda Standard Kree Armato 05/15/2019,10:31 PM

## 2019-05-15 NOTE — H&P (View-Only) (Signed)
Referring Provider: Orson Eva, MD Primary Care Physician:  Hilbert Corrigan, MD Primary Gastroenterologist:  Barney Drain, MD  Reason for Consultation:  Rectal bleeding s/p colonoscopy with polypectomy  HPI: Derrick Butler is a 63 y.o. male with past medical history significant for thrombocytopenia, psychosis, CVA, diabetes, cardiomyopathy, CAD status post CABG, chronic kidney disease presenting to the ED from the local nursing home pale, unresponsive, progressive rectal bleeding.  Patient had incomplete colonoscopy on September 8 for personal history of colonic polyps.  Exam was incomplete due to poor prep.  2 polyps were removed from the hepatic flexure and proximal ascending colon via cold snare.  Patient was found to have red blood in the rectum, biopsies taken.  External hemorrhoids noted.  Reportedly patient was fine evening of colonoscopy but yesterday the day nurse at the nursing home noticed blood per rectum while in the shower.  Vital signs were stable at that time, house physician evaluated the patient.  Patient continued to eat and drink with normal habits throughout the day.  Yesterday evening patient was found slumped over his roommates bed.  Oxygen saturations in the 60%, hypotensive.  EMS activated.Blood pressure per EMS 79/53, O2 saturation 60%.  Started on 6 L of oxygen with sats up to 93%.  Patient became aggressive and uncooperative.    In the ED he was hyperkalemic with potassium of 5.2.  Glucose 177.  BUN 44.  Creatinine 2.78, 2.352 days ago. Lactic acid greater than 11 on repeat was 3.3, hemoglobin 10.8 down from 11.8 (2 years ago), platelets 72,000 stable, INR 1.5.  UA suspicious for UTI.  Zosyn and vancomycin started in the ED.  Chest x-ray showed cardiomegaly with vascular congestion and probably early edema/CHF.  COVID negative.  BNP 3008.  Procalcitonin 0.45.  This morning his hemoglobin down to 9.2, platelets 43,000.  Creatinine down to 2.22, BUN 43.  T-max of 100.0  last night.  Soft blood pressures, this morning 98/64.  100% O2 sats.  Patient resting comfortably.  Easily arousable.  Oriented to person.  Unable to provide any history.   Prior to Admission medications   Medication Sig Start Date End Date Taking? Authorizing Provider  acetaminophen (TYLENOL) 325 MG tablet Take 650 mg by mouth every 6 (six) hours as needed for mild pain, moderate pain or fever.    [provider]  aspirin EC 81 MG tablet Take 81 mg by mouth daily. (0900)    [provider]  atorvastatin (LIPITOR) 20 MG tablet Take 20 mg by mouth daily at 6 PM. (1700)    [provider]  Biotin 5 MG TBDP Take 30 mg by mouth daily. (0900)    [provider]  cholecalciferol (VITAMIN D3) 25 MCG (1000 UT) tablet Take 1,000 Units by mouth daily. (0900)    [provider]  divalproex (DEPAKOTE SPRINKLE) 125 MG capsule Take 500 mg by mouth 3 (three) times daily. (0900, 1300, & 1700)    [provider]  famotidine (PEPCID) 20 MG tablet Take 20 mg by mouth 2 (two) times daily. (0900 & 2000)    [provider]  fluticasone (FLONASE) 50 MCG/ACT nasal spray Place 2 sprays into both nostrils daily. (0900)    [provider]  Insulin Detemir (LEVEMIR FLEXPEN) 100 UNIT/ML Pen Inject 5 Units into the skin daily at 10 pm.     [provider]  labetalol (NORMODYNE) 100 MG tablet Take 1 tablet (100 mg total) by mouth 2 (two) times daily. Patient  taking differently: Take 100 mg by mouth 2 (two) times daily. (0900 & 2100) 03/18/19   Josue Hector, MD  omega-3 acid ethyl esters (LOVAZA) 1 g capsule Take 1 g by mouth 2 (two) times daily. (0900 & 2100)    [provider]  QUEtiapine (SEROQUEL) 300 MG tablet Take 300 mg by mouth 2 (two) times daily. (0900 & 2100)    [provider]  risperiDONE (RISPERDAL) 1 MG tablet Take 1 tablet (1 mg total) by mouth 3 (three) times daily. 06/05/13   Cloria Spring, MD   trihexyphenidyl (ARTANE) 5 MG tablet Take 5 mg by mouth 2 (two) times daily with a meal. (0900 & 2100)    [provider]    Current Facility-Administered Medications  Medication Dose Route Frequency Provider Last Rate Last Dose  . acetaminophen (TYLENOL) tablet 650 mg  650 mg Oral Q6H PRN Zierle-Ghosh, Asia B, DO       Or  . acetaminophen (TYLENOL) suppository 650 mg  650 mg Rectal Q6H PRN Zierle-Ghosh, Asia B, DO      . atorvastatin (LIPITOR) tablet 20 mg  20 mg Oral q1800 Zierle-Ghosh, Asia B, DO      . Chlorhexidine Gluconate Cloth 2 % PADS 6 each  6 each Topical Q0600 Tat, David, MD      . cholecalciferol (VITAMIN D3) tablet 1,000 Units  1,000 Units Oral Daily Zierle-Ghosh, Asia B, DO      . divalproex (DEPAKOTE SPRINKLE) capsule 500 mg  500 mg Oral TID Zierle-Ghosh, Asia B, DO      . famotidine (PEPCID) tablet 20 mg  20 mg Oral BID Zierle-Ghosh, Asia B, DO   20 mg at 05/14/19 2306  . insulin aspart (novoLOG) injection 0-15 Units  0-15 Units Subcutaneous TID WC Zierle-Ghosh, Asia B, DO      . labetalol (NORMODYNE) tablet 100 mg  100 mg Oral BID Zierle-Ghosh, Asia B, DO      . mupirocin ointment (BACTROBAN) 2 % 1 application  1 application Nasal BID Tat, David, MD      . omega-3 acid ethyl esters (LOVAZA) capsule 1 g  1 g Oral BID Zierle-Ghosh, Asia B, DO   1 g at 05/14/19 2306  . ondansetron (ZOFRAN) tablet 4 mg  4 mg Oral Q6H PRN Zierle-Ghosh, Asia B, DO       Or  . ondansetron (ZOFRAN) injection 4 mg  4 mg Intravenous Q6H PRN Zierle-Ghosh, Asia B, DO      . pantoprazole (PROTONIX) injection 40 mg  40 mg Intravenous Q12H Zierle-Ghosh, Asia B, DO   40 mg at 05/14/19 2145  . piperacillin-tazobactam (ZOSYN) IVPB 3.375 g  3.375 g Intravenous Once Zierle-Ghosh, Asia B, DO   Stopped at 05/14/19 1950  . QUEtiapine (SEROQUEL) tablet 300 mg  300 mg Oral BID Zierle-Ghosh, Asia B, DO   300 mg at 05/14/19 2306  . risperiDONE (RISPERDAL) tablet 1 mg  1 mg Oral TID Zierle-Ghosh, Asia B, DO    1 mg at 05/14/19 2307  . traMADol (ULTRAM) tablet 50 mg  50 mg Oral Q6H PRN Zierle-Ghosh, Asia B, DO      . trihexyphenidyl (ARTANE) tablet 5 mg  5 mg Oral BID WC Zierle-Ghosh, Asia B, DO        Allergies as of 05/14/2019  . (No Known Allergies)    Past Medical History:  Diagnosis Date  . Agitation    and aggression  . Altered mental status    with psychosis  .  Anemia   . Aortic dissection ([facility])   . Aortic dissection ([facility])   . Bipolar 1 disorder ([facility])   . Cardiomyopathy   . Cardiomyopathy ([facility])   . CKD (chronic kidney disease)   . Coronary artery disease   . CVA (cerebral infarction)   . CVA (cerebral infarction)   . Dehydration   . Diabetes mellitus   . Dysphagia   . Encephalopathy   . Hyperglycemia   . Hyperkalemia   . Hyperlipemia   . Hypertension   . Hypertension   . Hyponatremia   . Lack of coordination   . Poor historian   . Psychosis ([facility])   . Psychosis ([facility])   . Thrombocytopenia ([facility])   . TIA (transient ischemic attack)     Past Surgical History:  Procedure Laterality Date  . ASCENDING AORTIC ANEURYSM REPAIR    . BIOPSY  12/21/2015   Procedure: BIOPSY;  Surgeon: Sandi L Fields, MD;  Location: [facility];  Service: Endoscopy;;  gastric biopsy  . COLONOSCOPY  05/13/2019   Dr. Fields: Incomplete due to poor bowel prep.  2 polyps removed via cold snare, one from the hepatic flexure and one from the proximal ascending colon.  Red blood noted in the rectum, status post biopsy.  External hemorrhoids.  Repeat colonoscopy in 3 months due to poor prep.  . COLONOSCOPY WITH PROPOFOL N/A 12/21/2015   SL[facility]: one 3mm polyp in the ascending colon , removed with a cold biopsy forceps. resectedand retrieved.  four 6- 8 mm polyps in the rectum , in the ascending colon and in the cedum, removed with a hot snare. resected and retrieved.  . ESOPHAGOGASTRODUODENOSCOPY (EGD) WITH PROPOFOL N/A 12/21/2015   SL[facility]: Web in the distal esophagus dilated. Erosive gastropathy, biopsied  normal duodenal bulb and second portion of the duodenum.   . POLYPECTOMY  12/21/2015   Procedure: POLYPECTOMY;  Surgeon: Sandi L Fields, MD;  Location: [facility];  Service: Endoscopy;;  at cecum and ascending colon; rectum  . SAVORY DILATION N/A 12/21/2015   Procedure: SAVORY DILATION;  Surgeon: Sandi L Fields, MD;  Location: [facility];  Service: Endoscopy;  Laterality: N/A;    Family History  Problem Relation Age of Onset  . Alzheimer's disease Mother   . Stroke Father   . Heart disease Father   . Bipolar disorder Father   . Bipolar disorder Sister   . Bipolar disorder Sister   . Colon cancer Neg Hx     Social History   Socioeconomic History  . Marital status: Single    Spouse name: Not on file  . Number of children: Not on file  . Years of education: Not on file  . Highest education level: Not on file  Occupational History  . Not on file  Social Needs  . Financial resource strain: Not on file  . Food insecurity    Worry: Not on file    Inability: Not on file  . Transportation needs    Medical: Not on file    Non-medical: Not on file  Tobacco Use  . Smoking status: Never Smoker  . Smokeless tobacco: Never Used  Substance and Sexual Activity  . Alcohol use: No    Alcohol/week: 0.0 standard drinks  . Drug use: No  . Sexual activity: Never  Lifestyle  . Physical activity    Days per week: Not on file    Minutes per session: Not on file  . Stress: Not on file  Relationships  .   Social connections    Talks on phone: Not on file    Gets together: Not on file    Attends religious service: Not on file    Active member of club or organization: Not on file    Attends meetings of clubs or organizations: Not on file    Relationship status: Not on file  . Intimate partner violence    Fear of current or ex partner: Not on file    Emotionally abused: Not on file    Physically abused: Not on file    Forced sexual activity: Not on file  Other Topics Concern  . Not  on file  Social History Narrative  . Not on file     ROS: Unable to obtain       Physical Examination: Vital signs in last 24 hours: Temp:  [97.5 F (36.4 C)-100 F (37.8 C)] 97.6 F (36.4 C) (09/10 0544) Pulse Rate:  [51-96] 65 (09/10 0544) Resp:  [15-27] 18 (09/10 0544) BP: (71-99)/(50-69) 89/50 (09/10 0544) SpO2:  [89 %-100 %] 99 % (09/10 0544) Weight:  [77 kg] 77 kg (09/09 2237) Last BM Date: 05/14/19  General: Elderly pale male in no acute distress.  Easily arousable.  Able to tell me his name but otherwise does not answer questions. Head: Normocephalic, atraumatic.   Eyes: Conjunctiva pink, no icterus. Mouth: Oropharyngeal mucosa moist and pink , no lesions erythema or exudate. Neck: Supple without thyromegaly, masses, or lymphadenopathy.  Lungs: Clear to auscultation bilaterally.  Heart: Regular rate and rhythm, no murmurs rubs or gallops.  Abdomen: Bowel sounds are normal, nondistended, no hepatosplenomegaly or masses, no abdominal bruits or    hernia , no rebound or guarding.  Tenderness noted in the suprapubic region based on grimacing Rectal: Not performed Extremities: No lower extremity edema, clubbing, deformity.  Neuro: Alert and oriented x 1 , grossly normal neurologically.  Skin: Warm and dry, no rash or jaundice.   Psych: Alert but otherwise could not assess        Intake/Output from previous day: No intake/output data recorded. Intake/Output this shift: No intake/output data recorded.  Lab Results: CBC Recent Labs    05/14/19 1756 05/14/19 1801 05/14/19 2327 05/15/19 0443  WBC 9.2  --   --  4.7  HGB 10.8* 10.5* 9.7* 9.2*  HCT 35.3* 31.0* 30.3* 28.0*  MCV 104.1*  --   --  97.2  PLT 72*  --   --  43*   BMET Recent Labs    05/13/19 1033 05/14/19 1756 05/14/19 1801 05/15/19 0443  NA 143 138 139 138  K 5.2* 5.1 5.2* 4.5  CL 107 103 106 112*  CO2 23 16*  --  19*  GLUCOSE 73 193* 177* 132*  BUN 46* 44* 40* 43*  CREATININE 2.35* 2.78* 2.60*  2.22*  CALCIUM 8.7* 8.4*  --  7.4*   LFT Recent Labs    05/14/19 1756 05/15/19 0443  BILITOT 0.8 0.7  ALKPHOS 50 41  AST 37 28  ALT 14 14  PROT 5.3* 4.4*  ALBUMIN 3.0* 2.5*    Lipase No results for input(s): LIPASE in the last 72 hours.  PT/INR Recent Labs    05/14/19 1756  LABPROT 17.6*  INR 1.5*      Imaging Studies: Dg Chest Portable 1 View  Result Date: 05/14/2019 CLINICAL DATA:  Weakness EXAM: PORTABLE CHEST 1 VIEW COMPARISON:  04/13/2017 FINDINGS: Prior median sternotomy. Cardiomegaly with vascular congestion. Interstitial prominence likely reflects interstitial edema. No visible   effusions or pneumothorax. No acute bony abnormality. IMPRESSION: Cardiomegaly with vascular congestion and probable early edema/CHF. Electronically Signed   By: Rolm Baptise M.D.   On: 05/14/2019 19:17  [4 week]   Impression: 63 year old gentleman with multiple comorbidities as outlined above, underwent incomplete colonoscopy 2 days ago with removal of 2 tubular adenomas via cold snare (hepatic flexure and proximal ascending colon).  Noted to have red blood in the rectum/external hemorrhoids.  Rectal biopsies consistent with ulcer bed.  Concern for possible enema trauma.    Patient presented with progressive rectal bleeding, hypotension in the setting of chronic thrombocytopenia.  Initial lactic acid elevated but improved with resuscitation.  Possible UTI but overall sepsis not suspected at this time. Spoke with Dr. Oneida Alar.  Suspected rectal bleeding due to abnormal rectal findings/?Enema trauma and may require clips.  Plan: 1. Plan for flexible sigmoidoscopy with MAC today.  We would like to thank you for the opportunity to participate in the care of RAS KOLLMAN.  Laureen Ochs. Bernarda Caffey [facility]qualin [facility] [phone] 9/10/202010:04 AM     LOS: 1 day

## 2019-05-15 NOTE — Interval H&P Note (Signed)
History and Physical Interval Note:  05/15/2019 1:52 PM  Derrick Butler  has presented today for surgery, with the diagnosis of rectal bleeding, rectal tear.  The various methods of treatment have been discussed with the patient and family. After consideration of risks, benefits and other options for treatment, the patient has consented to  Procedure(s): FLEXIBLE SIGMOIDOSCOPY (N/A) as a surgical intervention.  The patient's history has been reviewed, patient examined, no change in status, stable for surgery.  I have reviewed the patient's chart and labs.  Questions were answered to the patient's satisfaction.     [employer]

## 2019-05-15 NOTE — Telephone Encounter (Signed)
REVIEWED. WILL SEE INPT.

## 2019-05-15 NOTE — Consult Note (Signed)
Referring Provider: Orson Eva, MD Primary Care Physician:  Hilbert Corrigan, MD Primary Gastroenterologist:  Barney Drain, MD  Reason for Consultation:  Rectal bleeding s/p colonoscopy with polypectomy  HPI: Derrick Butler is a 63 y.o. male with past medical history significant for thrombocytopenia, psychosis, CVA, diabetes, cardiomyopathy, CAD status post CABG, chronic kidney disease presenting to the ED from the local nursing home pale, unresponsive, progressive rectal bleeding.  Patient had incomplete colonoscopy on September 8 for personal history of colonic polyps.  Exam was incomplete due to poor prep.  2 polyps were removed from the hepatic flexure and proximal ascending colon via cold snare.  Patient was found to have red blood in the rectum, biopsies taken.  External hemorrhoids noted.  Reportedly patient was fine evening of colonoscopy but yesterday the day nurse at the nursing home noticed blood per rectum while in the shower.  Vital signs were stable at that time, house physician evaluated the patient.  Patient continued to eat and drink with normal habits throughout the day.  Yesterday evening patient was found slumped over his roommates bed.  Oxygen saturations in the 60%, hypotensive.  EMS activated.Blood pressure per EMS 79/53, O2 saturation 60%.  Started on 6 L of oxygen with sats up to 93%.  Patient became aggressive and uncooperative.    In the ED he was hyperkalemic with potassium of 5.2.  Glucose 177.  BUN 44.  Creatinine 2.78, 2.352 days ago. Lactic acid greater than 11 on repeat was 3.3, hemoglobin 10.8 down from 11.8 (2 years ago), platelets 72,000 stable, INR 1.5.  UA suspicious for UTI.  Zosyn and vancomycin started in the ED.  Chest x-ray showed cardiomegaly with vascular congestion and probably early edema/CHF.  COVID negative.  BNP 3008.  Procalcitonin 0.45.  This morning his hemoglobin down to 9.2, platelets 43,000.  Creatinine down to 2.22, BUN 43.  T-max of 100.0  last night.  Soft blood pressures, this morning 98/64.  100% O2 sats.  Patient resting comfortably.  Easily arousable.  Oriented to person.  Unable to provide any history.   Prior to Admission medications   Medication Sig Start Date End Date Taking? Authorizing Provider  acetaminophen (TYLENOL) 325 MG tablet Take 650 mg by mouth every 6 (six) hours as needed for mild pain, moderate pain or fever.    [provider]  aspirin EC 81 MG tablet Take 81 mg by mouth daily. (0900)    [provider]  atorvastatin (LIPITOR) 20 MG tablet Take 20 mg by mouth daily at 6 PM. (1700)    [provider]  Biotin 5 MG TBDP Take 30 mg by mouth daily. (0900)    [provider]  cholecalciferol (VITAMIN D3) 25 MCG (1000 UT) tablet Take 1,000 Units by mouth daily. (0900)    [provider]  divalproex (DEPAKOTE SPRINKLE) 125 MG capsule Take 500 mg by mouth 3 (three) times daily. (0900, 1300, & 1700)    [provider]  famotidine (PEPCID) 20 MG tablet Take 20 mg by mouth 2 (two) times daily. (0900 & 2000)    [provider]  fluticasone (FLONASE) 50 MCG/ACT nasal spray Place 2 sprays into both nostrils daily. (0900)    [provider]  Insulin Detemir (LEVEMIR FLEXPEN) 100 UNIT/ML Pen Inject 5 Units into the skin daily at 10 pm.     [provider]  labetalol (NORMODYNE) 100 MG tablet Take 1 tablet (100 mg total) by mouth 2 (two) times daily. Patient  taking differently: Take 100 mg by mouth 2 (two) times daily. (0900 & 2100) 03/18/19   Josue Hector, MD  omega-3 acid ethyl esters (LOVAZA) 1 g capsule Take 1 g by mouth 2 (two) times daily. (0900 & 2100)    [provider]  QUEtiapine (SEROQUEL) 300 MG tablet Take 300 mg by mouth 2 (two) times daily. (0900 & 2100)    [provider]  risperiDONE (RISPERDAL) 1 MG tablet Take 1 tablet (1 mg total) by mouth 3 (three) times daily. 06/05/13   Cloria Spring, MD   trihexyphenidyl (ARTANE) 5 MG tablet Take 5 mg by mouth 2 (two) times daily with a meal. (0900 & 2100)    [provider]    Current Facility-Administered Medications  Medication Dose Route Frequency Provider Last Rate Last Dose  . acetaminophen (TYLENOL) tablet 650 mg  650 mg Oral Q6H PRN Zierle-Ghosh, Asia B, DO       Or  . acetaminophen (TYLENOL) suppository 650 mg  650 mg Rectal Q6H PRN Zierle-Ghosh, Asia B, DO      . atorvastatin (LIPITOR) tablet 20 mg  20 mg Oral q1800 Zierle-Ghosh, Asia B, DO      . Chlorhexidine Gluconate Cloth 2 % PADS 6 each  6 each Topical Q0600 Tat, David, MD      . cholecalciferol (VITAMIN D3) tablet 1,000 Units  1,000 Units Oral Daily Zierle-Ghosh, Asia B, DO      . divalproex (DEPAKOTE SPRINKLE) capsule 500 mg  500 mg Oral TID Zierle-Ghosh, Asia B, DO      . famotidine (PEPCID) tablet 20 mg  20 mg Oral BID Zierle-Ghosh, Asia B, DO   20 mg at 05/14/19 2306  . insulin aspart (novoLOG) injection 0-15 Units  0-15 Units Subcutaneous TID WC Zierle-Ghosh, Asia B, DO      . labetalol (NORMODYNE) tablet 100 mg  100 mg Oral BID Zierle-Ghosh, Asia B, DO      . mupirocin ointment (BACTROBAN) 2 % 1 application  1 application Nasal BID Tat, David, MD      . omega-3 acid ethyl esters (LOVAZA) capsule 1 g  1 g Oral BID Zierle-Ghosh, Asia B, DO   1 g at 05/14/19 2306  . ondansetron (ZOFRAN) tablet 4 mg  4 mg Oral Q6H PRN Zierle-Ghosh, Asia B, DO       Or  . ondansetron (ZOFRAN) injection 4 mg  4 mg Intravenous Q6H PRN Zierle-Ghosh, Asia B, DO      . pantoprazole (PROTONIX) injection 40 mg  40 mg Intravenous Q12H Zierle-Ghosh, Asia B, DO   40 mg at 05/14/19 2145  . piperacillin-tazobactam (ZOSYN) IVPB 3.375 g  3.375 g Intravenous Once Zierle-Ghosh, Asia B, DO   Stopped at 05/14/19 1950  . QUEtiapine (SEROQUEL) tablet 300 mg  300 mg Oral BID Zierle-Ghosh, Asia B, DO   300 mg at 05/14/19 2306  . risperiDONE (RISPERDAL) tablet 1 mg  1 mg Oral TID Zierle-Ghosh, Asia B, DO    1 mg at 05/14/19 2307  . traMADol (ULTRAM) tablet 50 mg  50 mg Oral Q6H PRN Zierle-Ghosh, Asia B, DO      . trihexyphenidyl (ARTANE) tablet 5 mg  5 mg Oral BID WC Zierle-Ghosh, Asia B, DO        Allergies as of 05/14/2019  . (No Known Allergies)    Past Medical History:  Diagnosis Date  . Agitation    and aggression  . Altered mental status    with psychosis  .  Anemia   . Aortic dissection ([place])   . Aortic dissection ([place])   . Bipolar 1 disorder ([place])   . Cardiomyopathy   . Cardiomyopathy ([place])   . CKD (chronic kidney disease)   . Coronary artery disease   . CVA (cerebral infarction)   . CVA (cerebral infarction)   . Dehydration   . Diabetes mellitus   . Dysphagia   . Encephalopathy   . Hyperglycemia   . Hyperkalemia   . Hyperlipemia   . Hypertension   . Hypertension   . Hyponatremia   . Lack of coordination   . Poor historian   . Psychosis ([place])   . Psychosis ([place])   . Thrombocytopenia ([place])   . TIA (transient ischemic attack)     Past Surgical History:  Procedure Laterality Date  . ASCENDING AORTIC ANEURYSM REPAIR    . BIOPSY  12/21/2015   Procedure: BIOPSY;  Surgeon: Danie Binder, MD;  [facility];  Service: Endoscopy;;  gastric biopsy  . COLONOSCOPY  05/13/2019   Dr. Oneida Alar: Incomplete due to poor bowel prep.  2 polyps removed via cold snare, one from the hepatic flexure and one from the proximal ascending colon.  Red blood noted in the rectum, status post biopsy.  External hemorrhoids.  Repeat colonoscopy in 3 months due to poor prep.  . COLONOSCOPY WITH PROPOFOL N/A 12/21/2015   [facility]: one 9m polyp in the ascending colon , removed with a cold biopsy forceps. resectedand retrieved.  four 6- 8 mm polyps in the rectum , in the ascending colon and in the cedum, removed with a hot snare. resected and retrieved.  . ESOPHAGOGASTRODUODENOSCOPY (EGD) WITH PROPOFOL N/A 12/21/2015   [facility]: Web in the distal esophagus dilated. Erosive gastropathy, biopsied  normal duodenal bulb and second portion of the duodenum.   .Marland KitchenPOLYPECTOMY  12/21/2015   Procedure: POLYPECTOMY;  Surgeon: SDanie Binder MD;  [facility];  Service: Endoscopy;;  at cecum and ascending colon; rectum  . SAVORY DILATION N/A 12/21/2015   Procedure: SAVORY DILATION;  Surgeon: SDanie Binder MD;  [facility];  Service: Endoscopy;  Laterality: N/A;    Family History  Problem Relation Age of Onset  . Alzheimer's disease Mother   . Stroke Father   . Heart disease Father   . Bipolar disorder Father   . Bipolar disorder Sister   . Bipolar disorder Sister   . Colon cancer Neg Hx     Social History   Socioeconomic History  . Marital status: Single    Spouse name: Not on file  . Number of children: Not on file  . Years of education: Not on file  . Highest education level: Not on file  Occupational History  . Not on file  Social Needs  . Financial resource strain: Not on file  . Food insecurity    Worry: Not on file    Inability: Not on file  . Transportation needs    Medical: Not on file    Non-medical: Not on file  Tobacco Use  . Smoking status: Never Smoker  . Smokeless tobacco: Never Used  Substance and Sexual Activity  . Alcohol use: No    Alcohol/week: 0.0 standard drinks  . Drug use: No  . Sexual activity: Never  Lifestyle  . Physical activity    Days per week: Not on file    Minutes per session: Not on file  . Stress: Not on file  Relationships  .  Social Herbalist on phone: Not on file    Gets together: Not on file    Attends religious service: Not on file    Active member of club or organization: Not on file    Attends meetings of clubs or organizations: Not on file    Relationship status: Not on file  . Intimate partner violence    Fear of current or ex partner: Not on file    Emotionally abused: Not on file    Physically abused: Not on file    Forced sexual activity: Not on file  Other Topics Concern  . Not  on file  Social History Narrative  . Not on file     ROS: Unable to obtain       Physical Examination: Vital signs in last 24 hours: Temp:  [97.5 F (36.4 C)-100 F (37.8 C)] 97.6 F (36.4 C) (09/10 0544) Pulse Rate:  [51-96] 65 (09/10 0544) Resp:  [15-27] 18 (09/10 0544) BP: (71-99)/(50-69) 89/50 (09/10 0544) SpO2:  [89 %-100 %] 99 % (09/10 0544) Weight:  [77 kg] 77 kg (09/09 2237) Last BM Date: 05/14/19  General: Elderly pale male in no acute distress.  Easily arousable.  Able to tell me his name but otherwise does not answer questions. Head: Normocephalic, atraumatic.   Eyes: Conjunctiva pink, no icterus. Mouth: Oropharyngeal mucosa moist and pink , no lesions erythema or exudate. Neck: Supple without thyromegaly, masses, or lymphadenopathy.  Lungs: Clear to auscultation bilaterally.  Heart: Regular rate and rhythm, no murmurs rubs or gallops.  Abdomen: Bowel sounds are normal, nondistended, no hepatosplenomegaly or masses, no abdominal bruits or    hernia , no rebound or guarding.  Tenderness noted in the suprapubic region based on grimacing Rectal: Not performed Extremities: No lower extremity edema, clubbing, deformity.  Neuro: Alert and oriented x 1 , grossly normal neurologically.  Skin: Warm and dry, no rash or jaundice.   Psych: Alert but otherwise could not assess        Intake/Output from previous day: No intake/output data recorded. Intake/Output this shift: No intake/output data recorded.  Lab Results: CBC Recent Labs    05/14/19 1756 05/14/19 1801 05/14/19 2327 05/15/19 0443  WBC 9.2  --   --  4.7  HGB 10.8* 10.5* 9.7* 9.2*  HCT 35.3* 31.0* 30.3* 28.0*  MCV 104.1*  --   --  97.2  PLT 72*  --   --  43*   BMET Recent Labs    05/13/19 1033 05/14/19 1756 05/14/19 1801 05/15/19 0443  NA 143 138 139 138  K 5.2* 5.1 5.2* 4.5  CL 107 103 106 112*  CO2 23 16*  --  19*  GLUCOSE 73 193* 177* 132*  BUN 46* 44* 40* 43*  CREATININE 2.35* 2.78* 2.60*  2.22*  CALCIUM 8.7* 8.4*  --  7.4*   LFT Recent Labs    05/14/19 1756 05/15/19 0443  BILITOT 0.8 0.7  ALKPHOS 50 41  AST 37 28  ALT 14 14  PROT 5.3* 4.4*  ALBUMIN 3.0* 2.5*    Lipase No results for input(s): LIPASE in the last 72 hours.  PT/INR Recent Labs    05/14/19 1756  LABPROT 17.6*  INR 1.5*      Imaging Studies: Dg Chest Portable 1 View  Result Date: 05/14/2019 CLINICAL DATA:  Weakness EXAM: PORTABLE CHEST 1 VIEW COMPARISON:  04/13/2017 FINDINGS: Prior median sternotomy. Cardiomegaly with vascular congestion. Interstitial prominence likely reflects interstitial edema. No visible  effusions or pneumothorax. No acute bony abnormality. IMPRESSION: Cardiomegaly with vascular congestion and probable early edema/CHF. Electronically Signed   By: Rolm Baptise M.D.   On: 05/14/2019 19:17  [4 week]   Impression: 63 year old gentleman with multiple comorbidities as outlined above, underwent incomplete colonoscopy 2 days ago with removal of 2 tubular adenomas via cold snare (hepatic flexure and proximal ascending colon).  Noted to have red blood in the rectum/external hemorrhoids.  Rectal biopsies consistent with ulcer bed.  Concern for possible enema trauma.    Patient presented with progressive rectal bleeding, hypotension in the setting of chronic thrombocytopenia.  Initial lactic acid elevated but improved with resuscitation.  Possible UTI but overall sepsis not suspected at this time. Spoke with Dr. Oneida Alar.  Suspected rectal bleeding due to abnormal rectal findings/?Enema trauma and may require clips.  Plan: 1. Plan for flexible sigmoidoscopy with MAC today.  We would like to thank you for the opportunity to participate in the care of RAS KOLLMAN.  Laureen Ochs. Bernarda Caffey [facility] [phone] 9/10/202010:04 AM     LOS: 1 day

## 2019-05-15 NOTE — Anesthesia Postprocedure Evaluation (Signed)
Anesthesia Post Note  Patient: Derrick Butler  Procedure(s) Performed: FLEXIBLE SIGMOIDOSCOPY (N/A )  Patient location during evaluation: PACU Anesthesia Type: General Level of consciousness: awake and alert, oriented and patient cooperative Pain management: pain level controlled Vital Signs Assessment: post-procedure vital signs reviewed and stable Respiratory status: spontaneous breathing Cardiovascular status: stable Postop Assessment: no apparent nausea or vomiting Anesthetic complications: no     Last Vitals:  Vitals:   05/15/19 1445 05/15/19 1500  BP: (!) 90/57 (!) 91/56  Pulse: 71 70  Resp: (!) 25 16  Temp: (!) 36.4 C   SpO2: 99% 97%    Last Pain:  Vitals:   05/15/19 1445  TempSrc:   PainSc: Asleep                 Linard Daft A

## 2019-05-15 NOTE — NC FL2 (Signed)
[place] MEDICAID FL2 LEVEL OF CARE SCREENING TOOL     IDENTIFICATION  Patient Name: Derrick Butler Birthdate: 01/14/1956 Sex: male Admission Date (Current Location): 05/14/2019  [place] and [place] Number:    [id number] [place] and Address:  [facility] [address] [place]      Provider Number: [id number]  Attending Physician Name and Address:  Orson Eva, MD  Relative Name and Phone Number:       Current Level of Care: Hospital Recommended Level of Care: [place] Prior Approval Number:    Date Approved/Denied:   PASRR Number:    Discharge Plan: SNF    Current Diagnoses: Patient Active Problem List   Diagnosis Date Noted  . Acute respiratory failure with hypoxia ([place]) 05/15/2019  . Acute renal failure superimposed on stage 3 chronic kidney disease ([place]) 05/15/2019  . Acute blood loss anemia 05/15/2019  . Aspiration pneumonitis ([place]) 05/15/2019  . Lower GI bleed 05/15/2019  . GI bleed 05/14/2019  . Adenomatous colon polyp 01/26/2016  . AKI (acute kidney injury) ([place]) 01/12/2016  . DKA (diabetic ketoacidoses) ([place]) 01/09/2016  . Dysphagia 11/23/2015  . Bacteremia 11/09/2014  . Hyperkalemia 09/25/2014  . Hypoglycemia 09/25/2014  . HCAP (healthcare-associated pneumonia) 07/15/2014  . Acute encephalopathy 07/15/2014  . Sepsis ([place]) 07/15/2014  . Thrombocytopenia ([place]) 07/14/2014  . CAP (community acquired pneumonia) 07/13/2014  . Protein-calorie malnutrition, severe ([place]) 09/09/2013  . CKD (chronic kidney disease) stage 3, GFR 30-59 ml/min ([facility]) 09/09/2013  . CVA, old, cognitive deficits 09/06/2013  . UTI (lower urinary tract infection) 09/06/2013  . Dehydration 06/19/2013  . H/O: stroke 06/18/2013  . Altered mental status 06/18/2013  . Bipolar I disorder, most recent episode mixed ([place]) 06/02/2013  . CARDIOMYOPATHY OTHER DISEASES CLASSIFIED ELSW 02/21/2010  . Essential hypertension 03/12/2009  . AORTIC  DISSECTION 03/12/2009    Orientation RESPIRATION BLADDER Height & Weight     Self, Situation, Place, Time  O2(3L) Continent Weight: 169 lb 12.1 oz (77 kg) Height:     BEHAVIORAL SYMPTOMS/MOOD NEUROLOGICAL BOWEL NUTRITION STATUS      Continent Diet  AMBULATORY STATUS COMMUNICATION OF NEEDS Skin   Limited Assist Verbally Normal                       Personal Care Assistance Level of Assistance  Bathing, Dressing, Feeding Bathing Assistance: Limited assistance Feeding assistance: Independent(Set up assistance) Dressing Assistance: Limited assistance     Functional Limitations Info  Sight, Speech, Hearing Sight Info: Adequate Hearing Info: Adequate Speech Info: Adequate    SPECIAL CARE FACTORS FREQUENCY                       Contractures Contractures Info: Not present    Additional Factors Info  Code Status, Allergies, Psychotropic Code Status Info: Full code Allergies Info: NKA Psychotropic Info: Seroquel, Risperdal.         Current Medications (05/15/2019):  This is the current hospital active medication list Current Facility-Administered Medications  Medication Dose Route Frequency Provider Last Rate Last Dose  . acetaminophen (TYLENOL) tablet 650 mg  650 mg Oral Q6H PRN Zierle-Ghosh, Asia B, DO       Or  . acetaminophen (TYLENOL) suppository 650 mg  650 mg Rectal Q6H PRN Zierle-Ghosh, Asia B, DO      . atorvastatin (LIPITOR) tablet 20 mg  20 mg Oral q1800 Zierle-Ghosh, Asia B, DO      . Chlorhexidine Gluconate  Cloth 2 % PADS 6 each  6 each Topical Q0600 Tat, David, MD      . cholecalciferol (VITAMIN D3) tablet 1,000 Units  1,000 Units Oral Daily Zierle-Ghosh, Asia B, DO      . divalproex (DEPAKOTE SPRINKLE) capsule 500 mg  500 mg Oral TID Zierle-Ghosh, Asia B, DO      . famotidine (PEPCID) tablet 20 mg  20 mg Oral BID Zierle-Ghosh, Asia B, DO   20 mg at 05/14/19 2306  . furosemide (LASIX) injection 20 mg  20 mg Intravenous Daily Tat, David, MD      .  insulin aspart (novoLOG) injection 0-15 Units  0-15 Units Subcutaneous TID WC Zierle-Ghosh, Asia B, DO      . mupirocin ointment (BACTROBAN) 2 % 1 application  1 application Nasal BID Tat, David, MD      . omega-3 acid ethyl esters (LOVAZA) capsule 1 g  1 g Oral BID Zierle-Ghosh, Asia B, DO   1 g at 05/14/19 2306  . ondansetron (ZOFRAN) tablet 4 mg  4 mg Oral Q6H PRN Zierle-Ghosh, Asia B, DO       Or  . ondansetron (ZOFRAN) injection 4 mg  4 mg Intravenous Q6H PRN Zierle-Ghosh, Asia B, DO      . pantoprazole (PROTONIX) injection 40 mg  40 mg Intravenous Q12H Zierle-Ghosh, Asia B, DO   40 mg at 05/14/19 2145  . piperacillin-tazobactam (ZOSYN) IVPB 3.375 g  3.375 g Intravenous Q8H Tat, Shanon Brow, MD      . QUEtiapine (SEROQUEL) tablet 300 mg  300 mg Oral BID Zierle-Ghosh, Asia B, DO   300 mg at 05/14/19 2306  . risperiDONE (RISPERDAL) tablet 1 mg  1 mg Oral TID Zierle-Ghosh, Asia B, DO   1 mg at 05/14/19 2307  . traMADol (ULTRAM) tablet 50 mg  50 mg Oral Q6H PRN Zierle-Ghosh, Asia B, DO      . trihexyphenidyl (ARTANE) tablet 5 mg  5 mg Oral BID WC Zierle-Ghosh, Asia B, DO         Discharge Medications: Please see discharge summary for a list of discharge medications.  Relevant Imaging Results:  Relevant Lab Results:   Additional Information    Kahleb Mcclane, Clydene Pugh, LCSW

## 2019-05-15 NOTE — Anesthesia Preprocedure Evaluation (Addendum)
Anesthesia Evaluation  Patient identified by MRN, date of birth, ID band Patient awake    Reviewed: Allergy & Precautions, NPO status , Patient's Chart, lab work & pertinent test results  Airway Mallampati: II  TM Distance: >3 FB Neck ROM: Full    Dental no notable dental hx. (+) Poor Dentition, Missing, Chipped   Pulmonary pneumonia, resolved,    Pulmonary exam normal breath sounds clear to auscultation       Cardiovascular Exercise Tolerance: Poor hypertension, + CAD  Normal cardiovascular exam Rhythm:Regular Rate:Normal     Neuro/Psych Bipolar Disorder Schizophrenia TIACVA negative psych ROS   GI/Hepatic negative GI ROS, Neg liver ROS,   Endo/Other  negative endocrine ROSdiabetes  Renal/GU Renal InsufficiencyRenal disease  negative genitourinary   Musculoskeletal negative musculoskeletal ROS (+)   Abdominal   Peds negative pediatric ROS (+)  Hematology negative hematology ROS (+) anemia ,   Anesthesia Other Findings Unable to get much history, same as two days prior  Hgb drop after Colonscopy - here for flex sig to stop distal bleeding  Permission was obtained from POA -Glenda Gunn  Reproductive/Obstetrics negative OB ROS                            Anesthesia Physical Anesthesia Plan  ASA: III and emergent  Anesthesia Plan: General   Post-op Pain Management:    Induction: Intravenous  PONV Risk Score and Plan: 2 and Propofol infusion and TIVA  Airway Management Planned: Nasal Cannula and Simple Face Mask  Additional Equipment:   Intra-op Plan:   Post-operative Plan:   Informed Consent: I have reviewed the patients History and Physical, chart, labs and discussed the procedure including the risks, benefits and alternatives for the proposed anesthesia with the patient or authorized representative who has indicated his/her understanding and acceptance.     Dental advisory  given  Plan Discussed with: CRNA  Anesthesia Plan Comments: (Plan Full PPE use  Plan GA with GETA as needed d/w pt -WTP with same after Q&A)        Anesthesia Quick Evaluation

## 2019-05-15 NOTE — Progress Notes (Signed)
PROGRESS NOTE  Derrick Butler [id number] DOB: Nov 16, 1955 DOA: 05/14/2019 PCP: Hilbert Corrigan, MD  Brief History:  63 year old male with a history of stroke, thrombocytopenia, hypertension, hyperlipidemia, diabetes mellitus type 2, coronary disease, bipolar disorder, prior to impairment presenting from skilled nursing facility secondary to hematochezia, hypoxia, and hypotension.  The patient is unable to provide any history secondary to his cognitive impairment.  Nevertheless, the patient underwent colonoscopy by Dr. Oneida Alar on 05/13/2019.  He was noted to have a poor prep.  2 polyps were removed.  There was blood noted in the patient's rectum which was biopsied.  External hemorrhoids were noted.  The patient was discharged back to his nursing facility in stable condition.  Patient was noted to have some hematochezia on the morning of 05/14/2019, but he was stable and eating.  Later in the day, the patient was noted to have hypotension with oxygen saturation in the 60s slumped over his roommate's chair.  EMS was activated.  In the emergency department, the patient was noted to have temperature of 100.0 F with soft blood pressures in the 80s.  He required supplemental oxygen at 3 L for oxygen saturations 95-100%.  His hemoglobin was 10.8 with a serum creatinine of 2.78.  Urinalysis showed pyuria.  Patient was started on vancomycin and Zosyn.  Chest x-ray showed vascular congestion.  Assessment/Plan: Acute respiratory failure with hypoxia -Secondary to pulmonary edema and possible pneumonia -Presently stable on 3 L nasal cannula -Wean oxygen as tolerated for saturations greater 92% -personally reviewed CXR--increased interstitial markings and RLL opacity  Pulmonary edema/acute diastolic CHF -Start IV furosemide -Echocardiogram -BNP 3008 -Daily weights  Hematochezia/acute blood loss anemia -Reconsult GI -Patient had previous hemoglobin 13.2 on 09/12/2017 -Presented with  hemoglobin 10.8  Acute on chronic renal failure--CKD stage III -Baseline creatinine 2.2-2.3 -Secondary to hemodynamic changes as well as volume depletion -Monitor with diuresis  Lactic acidosis -I feel that this is primarily due to the patient's prolonged hypoxia as opposed to severe sepsis -Improved with fluid resuscitation  Aspiration pneumonitis -Continue Zosyn  UTI -Continue Zosyn pending culture data  Diabetes mellitus type 2 -Hemoglobin A1c -NovoLog sliding scale -Holding Levemir for now  Thrombocytopenia -This appears to be chronic with acute worsening secondary to his acute medical condition and infectious process -SCDs only for now  Essential hypertension -Holding labetalol secondary to soft blood pressures  Cognitive impairment -Continue home doses of Depakote, Seroquel, Risperdal, Artane  Mixed hyperlipidemia -Continue Lipitor and Lovaza      Disposition Plan:   SNF 2-3 days  Family Communication:   Family at bedside  Consultants:  none  Code Status:  FULL   DVT Prophylaxis:  SCDs   Procedures: As Listed in Progress Note Above  Antibiotics: Zosyn 9/10>>> vanco 9/9>>>     Subjective: Limited secondary to the patient's cognitive impairment.  He currently denies any chest pain, shortness breath, abdominal pain, headache.  Objective: Vitals:   05/14/19 2030 05/14/19 2130 05/14/19 2145 05/14/19 2237  BP: 99/62 (!) 88/64 98/64   Pulse: 65 75 70   Resp: (!) 21 (!) 21 (!) 25   Temp:      TempSrc:      SpO2: 100% (!) 89% 100%   Weight:    77 kg   No intake or output data in the 24 hours ending 05/15/19 0847 Weight change:  Exam:   General:  Pt is alert, follows commands appropriately, not in acute distress  HEENT: No icterus, No thrush, No neck mass, [place]/AT  Cardiovascular: RRR, S1/S2, no rubs, no gallops  Respiratory: Scattered bilateral rales.  No wheezing.  Good air movement.  Abdomen: Soft/+BS, non tender, non distended, no  guarding  Extremities: No edema, No lymphangitis, No petechiae, No rashes, no synovitis   Data Reviewed: I have personally reviewed following labs and imaging studies Basic Metabolic Panel: Recent Labs  Lab 05/13/19 1033 05/14/19 1756 05/14/19 1801 05/15/19 0443  NA 143 138 139 138  K 5.2* 5.1 5.2* 4.5  CL 107 103 106 112*  CO2 23 16*  --  19*  GLUCOSE 73 193* 177* 132*  BUN 46* 44* 40* 43*  CREATININE 2.35* 2.78* 2.60* 2.22*  CALCIUM 8.7* 8.4*  --  7.4*   Liver Function Tests: Recent Labs  Lab 05/14/19 1756 05/15/19 0443  AST 37 28  ALT 14 14  ALKPHOS 50 41  BILITOT 0.8 0.7  PROT 5.3* 4.4*  ALBUMIN 3.0* 2.5*   No results for input(s): LIPASE, AMYLASE in the last 168 hours. No results for input(s): AMMONIA in the last 168 hours. Coagulation Profile: Recent Labs  Lab 05/14/19 1756  INR 1.5*   CBC: Recent Labs  Lab 05/14/19 1756 05/14/19 1801 05/14/19 2327 05/15/19 0443  WBC 9.2  --   --  4.7  NEUTROABS 3.9  --   --   --   HGB 10.8* 10.5* 9.7* 9.2*  HCT 35.3* 31.0* 30.3* 28.0*  MCV 104.1*  --   --  97.2  PLT 72*  --   --  43*   Cardiac Enzymes: No results for input(s): CKTOTAL, CKMB, CKMBINDEX, TROPONINI in the last 168 hours. BNP: Invalid input(s): POCBNP CBG: Recent Labs  Lab 05/13/19 1036 05/13/19 1227 05/13/19 1257 05/14/19 2256  GLUCAP 71 62* 99 152*   HbA1C: No results for input(s): HGBA1C in the last 72 hours. Urine analysis:    Component Value Date/Time   COLORURINE YELLOW 05/14/2019 1824   APPEARANCEUR HAZY (A) 05/14/2019 1824   LABSPEC 1.017 05/14/2019 1824   PHURINE 5.0 05/14/2019 1824   GLUCOSEU NEGATIVE 05/14/2019 1824   HGBUR SMALL (A) 05/14/2019 1824   BILIRUBINUR NEGATIVE 05/14/2019 1824   KETONESUR NEGATIVE 05/14/2019 1824   PROTEINUR 30 (A) 05/14/2019 1824   UROBILINOGEN 1.0 10/07/2014 1726   NITRITE NEGATIVE 05/14/2019 1824   LEUKOCYTESUR MODERATE (A) 05/14/2019 1824   Sepsis Labs:  @LABRCNTIP (procalcitonin:4,lacticidven:4) ) Recent Results (from the past 240 hour(s))  SARS Coronavirus 2 [facility] order, Performed in [facility] hospital lab) Nasopharyngeal Nasopharyngeal Swab     Status: None   Collection Time: 05/13/19  8:00 AM   Specimen: Nasopharyngeal Swab  Result Value Ref Range Status   SARS Coronavirus 2 NEGATIVE NEGATIVE Final    Comment: (NOTE) If result is NEGATIVE SARS-CoV-2 target nucleic acids are NOT DETECTED. The SARS-CoV-2 RNA is generally detectable in upper and lower  respiratory specimens during the acute phase of infection. The lowest  concentration of SARS-CoV-2 viral copies this assay can detect is 250  copies / mL. A negative result does not preclude SARS-CoV-2 infection  and should not be used as the sole basis for treatment or other  patient management decisions.  A negative result may occur with  improper specimen collection / handling, submission of specimen other  than nasopharyngeal swab, presence of viral mutation(s) within the  areas targeted by this assay, and inadequate number of viral copies  (<250 copies / mL). A negative result must be combined with  clinical  observations, patient history, and epidemiological information. If result is POSITIVE SARS-CoV-2 target nucleic acids are DETECTED. The SARS-CoV-2 RNA is generally detectable in upper and lower  respiratory specimens dur ing the acute phase of infection.  Positive  results are indicative of active infection with SARS-CoV-2.  Clinical  correlation with patient history and other diagnostic information is  necessary to determine patient infection status.  Positive results do  not rule out bacterial infection or co-infection with other viruses. If result is PRESUMPTIVE POSTIVE SARS-CoV-2 nucleic acids MAY BE PRESENT.   A presumptive positive result was obtained on the submitted specimen  and confirmed on repeat testing.  While 2019 novel coronavirus  (SARS-CoV-2) nucleic  acids may be present in the submitted sample  additional confirmatory testing may be necessary for epidemiological  and / or clinical management purposes  to differentiate between  SARS-CoV-2 and other Sarbecovirus currently known to infect humans.  If clinically indicated additional testing with an alternate test  methodology [phone]) is advised. The SARS-CoV-2 RNA is generally  detectable in upper and lower respiratory sp ecimens during the acute  phase of infection. The expected result is Negative. Fact Sheet for Patients:  StrictlyIdeas.no Fact Sheet for Healthcare Providers: BankingDealers.co.za This test is not yet approved or cleared by the [place] FDA and has been authorized for detection and/or diagnosis of SARS-CoV-2 by FDA under an Emergency Use Authorization (EUA).  This EUA will remain in effect (meaning this test can be used) for the duration of the COVID-19 declaration under Section 564(b)(1) of the Act, 21 U.S.C. section 360bbb-3(b)(1), unless the authorization is terminated or revoked sooner. Performed at [facility], [address], [place], [place] [address]   Blood Culture (routine x 2)     Status: None (Preliminary result)   Collection Time: 05/14/19  7:25 PM   Specimen: BLOOD RIGHT HAND  Result Value Ref Range Status   Specimen Description BLOOD RIGHT HAND  Final   Special Requests   Final    BOTTLES DRAWN AEROBIC AND ANAEROBIC Blood Culture adequate volume   Culture   Final    NO GROWTH < 12 HOURS Performed at [facility], [address], [place], [place] [address]    Report Status PENDING  Incomplete  Blood Culture (routine x 2)     Status: None (Preliminary result)   Collection Time: 05/14/19  7:25 PM   Specimen: BLOOD LEFT HAND  Result Value Ref Range Status   Specimen Description BLOOD LEFT HAND  Final   Special Requests   Final    BOTTLES DRAWN AEROBIC AND ANAEROBIC Blood Culture adequate volume    Culture   Final    NO GROWTH < 12 HOURS Performed at [facility], [address] [place], [place] [address]    Report Status PENDING  Incomplete  MRSA PCR Screening     Status: Abnormal   Collection Time: 05/14/19 10:36 PM   Specimen: Nasal Mucosa; Nasopharyngeal  Result Value Ref Range Status   MRSA by PCR POSITIVE (A) NEGATIVE Final    Comment:        The GeneXpert MRSA Assay (FDA approved for NASAL specimens only), is one component of a comprehensive MRSA colonization surveillance program. It is not intended to diagnose MRSA infection nor to guide or monitor treatment for MRSA infections. RESULT CALLED TO, READ BACK BY AND VERIFIED WITH: BROWN S. AT [id number] ON [id number] BY THOMPSON S. Performed at [facility], [address] [place], [place] [address]  Scheduled Meds: . atorvastatin  20 mg Oral q1800  . Chlorhexidine Gluconate Cloth  6 each Topical Q0600  . cholecalciferol  1,000 Units Oral Daily  . divalproex  500 mg Oral TID  . famotidine  20 mg Oral BID  . insulin aspart  0-15 Units Subcutaneous TID WC  . mupirocin ointment  1 application Nasal BID  . omega-3 acid ethyl esters  1 g Oral BID  . pantoprazole (PROTONIX) IV  40 mg Intravenous Q12H  . QUEtiapine  300 mg Oral BID  . risperiDONE  1 mg Oral TID  . trihexyphenidyl  5 mg Oral BID WC   Continuous Infusions: . piperacillin-tazobactam Stopped (05/14/19 1950)    Procedures/Studies: Dg Chest Portable 1 View  Result Date: 05/14/2019 CLINICAL DATA:  Weakness EXAM: PORTABLE CHEST 1 VIEW COMPARISON:  04/13/2017 FINDINGS: Prior median sternotomy. Cardiomegaly with vascular congestion. Interstitial prominence likely reflects interstitial edema. No visible effusions or pneumothorax. No acute bony abnormality. IMPRESSION: Cardiomegaly with vascular congestion and probable early edema/CHF. Electronically Signed   By: Rolm Baptise M.D.   On: 05/14/2019 19:17    Orson Eva, DO  [facility] Pager [phone]   If 7PM-7AM, please contact night-coverage [email or web address] Password [email or web address] 05/15/2019, 8:47 AM   LOS: 1 day

## 2019-05-15 NOTE — Telephone Encounter (Signed)
Spoke with Hassan Rowan at [facility]. She states patient was sent to ED last night with rectal bleeding and was admitted. She also states she knows patient is not going to be able to prep for the TCS. He didn't do well last time reason he is having to repeat. fowarding to Dr. Oneida Alar

## 2019-05-15 NOTE — Progress Notes (Signed)
cc'd to pcp 

## 2019-05-15 NOTE — Plan of Care (Signed)
  Problem: Education: Goal: Knowledge of General Education information will improve Description Including pain rating scale, medication(s)/side effects and non-pharmacologic comfort measures Outcome: Progressing   

## 2019-05-15 NOTE — Progress Notes (Signed)
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  ECHO shows EF of 30-35 % with apical akinesis.  eICU Interventions  I have asked that PT be transferred to [facility] CCU under the critical care service. Cardiology and cardiovascular surgery will be consulted on arrival, Heparinization and anti-platelet RX for acute coronary syndrome deferred pending cardiology evaluation, given recent history of significant GI bleeding. Pt is at hight risk for decompensation, troponin will be cycled.        Kerry Kass Ogan 05/15/2019, 11:10 PM

## 2019-05-15 NOTE — Progress Notes (Signed)
Patient trying to climb out of bed, agitated and short of breath. O2 in the low 80's on Room air. O2 at 4L placed via [place] with sats coming up to 88%. Respiratory Therapist in room and patient placed on High Flow O2 at 15L. BP 68/43 , Afib on telemetry running in the 120's. Mid level paged and orders given for Chest xray. Patient calmer at this time. Still having labored breathing at rest.

## 2019-05-15 NOTE — Progress Notes (Signed)
NP was paged due to patient sats dropping down to 80's, and requiring 15L heater [place] to get him back up to 90%. NP notified myself. Troponins, EKG, CXR were ordered. ABG was ordered, but patient was not able to be restrained enough to get the arterial gas. Patient was breathing at 36bpm. I put in transfer to ICU, and came up to see the patient. Patient was already improving, satting at 100% on 10L. It was then noted that he went into Afib, and had returned to NSR at rate 88. On limited review of record - it does not appear that the patient has been in Afib before. Echo was already done today - so this was not ordered for the workup of Afib. I did however order an EEG as patient does have history of seizures - and he did have agitated altered mental status for approx 30 minutes after his hypoxemic episode. Of note - This episode sounds very similar to the episode that was described by the nursing home when they sent him in. Cardiology consult placed for new onset unstable afib.

## 2019-05-15 NOTE — Anesthesia Procedure Notes (Signed)
Procedure Name: General with mask airway Date/Time: 05/15/2019 1:53 PM Performed by: Andree Elk, Amy A, CRNA Pre-anesthesia Checklist: Timeout performed, Patient being monitored, Suction available, Emergency Drugs available and Patient identified Oxygen Delivery Method: Non-rebreather mask

## 2019-05-15 NOTE — Progress Notes (Signed)
Spoke with Holley Raring, patients responsible party for update and to let her know that patient was transferring to [facility] ICU. All questions answered.

## 2019-05-16 ENCOUNTER — Encounter (HOSPITAL_COMMUNITY): Payer: Self-pay | Admitting: Gastroenterology

## 2019-05-16 ENCOUNTER — Inpatient Hospital Stay (HOSPITAL_COMMUNITY): Payer: Medicare Other

## 2019-05-16 DIAGNOSIS — Z7189 Other specified counseling: Secondary | ICD-10-CM

## 2019-05-16 DIAGNOSIS — K922 Gastrointestinal hemorrhage, unspecified: Secondary | ICD-10-CM

## 2019-05-16 DIAGNOSIS — D62 Acute posthemorrhagic anemia: Secondary | ICD-10-CM

## 2019-05-16 DIAGNOSIS — A419 Sepsis, unspecified organism: Secondary | ICD-10-CM

## 2019-05-16 DIAGNOSIS — R4182 Altered mental status, unspecified: Secondary | ICD-10-CM

## 2019-05-16 DIAGNOSIS — I7102 Dissection of abdominal aorta: Secondary | ICD-10-CM

## 2019-05-16 LAB — CBC WITH DIFFERENTIAL/PLATELET
Abs Immature Granulocytes: 0.04 10*3/uL (ref 0.00–0.07)
Basophils Absolute: 0 10*3/uL (ref 0.0–0.1)
Basophils Relative: 0 %
Eosinophils Absolute: 0 10*3/uL (ref 0.0–0.5)
Eosinophils Relative: 0 %
HCT: 29.5 % — ABNORMAL LOW (ref 39.0–52.0)
Hemoglobin: 9.6 g/dL — ABNORMAL LOW (ref 13.0–17.0)
Immature Granulocytes: 1 %
Lymphocytes Relative: 26 %
Lymphs Abs: 1.4 10*3/uL (ref 0.7–4.0)
MCH: 31.6 pg (ref 26.0–34.0)
MCHC: 32.5 g/dL (ref 30.0–36.0)
MCV: 97 fL (ref 80.0–100.0)
Monocytes Absolute: 0.6 10*3/uL (ref 0.1–1.0)
Monocytes Relative: 11 %
Neutro Abs: 3.4 10*3/uL (ref 1.7–7.7)
Neutrophils Relative %: 62 %
Platelets: 44 10*3/uL — ABNORMAL LOW (ref 150–400)
RBC: 3.04 MIL/uL — ABNORMAL LOW (ref 4.22–5.81)
RDW: 12.3 % (ref 11.5–15.5)
WBC: 5.5 10*3/uL (ref 4.0–10.5)
nRBC: 0 % (ref 0.0–0.2)

## 2019-05-16 LAB — COMPREHENSIVE METABOLIC PANEL
ALT: 19 U/L (ref 0–44)
AST: 48 U/L — ABNORMAL HIGH (ref 15–41)
Albumin: 2.7 g/dL — ABNORMAL LOW (ref 3.5–5.0)
Alkaline Phosphatase: 54 U/L (ref 38–126)
Anion gap: 10 (ref 5–15)
BUN: 44 mg/dL — ABNORMAL HIGH (ref 8–23)
CO2: 21 mmol/L — ABNORMAL LOW (ref 22–32)
Calcium: 8.1 mg/dL — ABNORMAL LOW (ref 8.9–10.3)
Chloride: 107 mmol/L (ref 98–111)
Creatinine, Ser: 3.07 mg/dL — ABNORMAL HIGH (ref 0.61–1.24)
GFR calc Af Amer: 24 mL/min — ABNORMAL LOW (ref >60)
GFR calc non Af Amer: 21 mL/min — ABNORMAL LOW (ref >60)
Glucose, Bld: 131 mg/dL — ABNORMAL HIGH (ref 70–99)
Potassium: 5 mmol/L (ref 3.5–5.1)
Sodium: 138 mmol/L (ref 135–145)
Total Bilirubin: 0.7 mg/dL (ref 0.3–1.2)
Total Protein: 4.9 g/dL — ABNORMAL LOW (ref 6.5–8.1)

## 2019-05-16 LAB — TROPONIN I (HIGH SENSITIVITY): Troponin I (High Sensitivity): 1884 ng/L (ref <18)

## 2019-05-16 LAB — LACTIC ACID, PLASMA: Lactic Acid, Venous: 3.8 mmol/L (ref 0.5–1.9)

## 2019-05-16 LAB — HEMOGLOBIN A1C
Hgb A1c MFr Bld: 6 % — ABNORMAL HIGH (ref 4.8–5.6)
Mean Plasma Glucose: 126 mg/dL

## 2019-05-16 LAB — TYPE AND SCREEN
ABO/RH(D): O POS
Antibody Screen: NEGATIVE

## 2019-05-16 LAB — HIV ANTIBODY (ROUTINE TESTING W REFLEX): HIV Screen 4th Generation wRfx: NONREACTIVE

## 2019-05-16 LAB — PHOSPHORUS: Phosphorus: 4.4 mg/dL (ref 2.5–4.6)

## 2019-05-16 LAB — GLUCOSE, CAPILLARY
Glucose-Capillary: 115 mg/dL — ABNORMAL HIGH (ref 70–99)
Glucose-Capillary: 110 mg/dL — ABNORMAL HIGH (ref 70–99)
Glucose-Capillary: 138 mg/dL — ABNORMAL HIGH (ref 70–99)

## 2019-05-16 LAB — MAGNESIUM: Magnesium: 2 mg/dL (ref 1.7–2.4)

## 2019-05-16 MED ORDER — AMIODARONE HCL IN DEXTROSE 360-4.14 MG/200ML-% IV SOLN
30.0000 mg/h | INTRAVENOUS | Status: DC
  Filled 2019-05-16: qty 200

## 2019-05-16 MED ORDER — LORAZEPAM 2 MG/ML IJ SOLN
0.5000 mg | Freq: Four times a day (QID) | INTRAMUSCULAR | Status: DC
  Administered 2019-05-16 – 2019-05-17 (×3): 0.5 mg via INTRAVENOUS
  Filled 2019-05-16 (×3): qty 1

## 2019-05-16 MED ORDER — NOREPINEPHRINE 4 MG/250ML-% IV SOLN: 0.0000 ug/min | INTRAVENOUS | Status: DC

## 2019-05-16 MED ORDER — AMIODARONE HCL IN DEXTROSE 360-4.14 MG/200ML-% IV SOLN
60.0000 mg/h | INTRAVENOUS | Status: DC
  Filled 2019-05-16: qty 200

## 2019-05-16 MED ORDER — HALOPERIDOL LACTATE 5 MG/ML IJ SOLN: 2.0000 mg | Freq: Once | INTRAMUSCULAR | Status: DC

## 2019-05-16 MED ORDER — QUETIAPINE FUMARATE 25 MG PO TABS
100.0000 mg | ORAL_TABLET | Freq: Two times a day (BID) | ORAL | Status: DC
  Administered 2019-05-17 (×2): 100 mg via ORAL
  Filled 2019-05-16 (×2): qty 4

## 2019-05-16 MED ORDER — AMIODARONE IV BOLUS ONLY 150 MG/100ML
150.0000 mg | Freq: Once | INTRAVENOUS | Status: DC
  Filled 2019-05-16: qty 100

## 2019-05-16 MED ORDER — PANTOPRAZOLE SODIUM 40 MG IV SOLR
40.0000 mg | INTRAVENOUS | Status: DC
  Administered 2019-05-17 – 2019-05-18 (×2): 40 mg via INTRAVENOUS
  Filled 2019-05-16 (×2): qty 40

## 2019-05-16 MED ORDER — ALBUMIN HUMAN 5 % IV SOLN
12.5000 g | Freq: Once | INTRAVENOUS | Status: AC
  Administered 2019-05-16: 12.5 g via INTRAVENOUS
  Filled 2019-05-16: qty 250

## 2019-05-16 MED ORDER — RISPERIDONE 0.5 MG PO TBDP
0.5000 mg | ORAL_TABLET | Freq: Every day | ORAL | Status: DC
  Administered 2019-05-17: 01:00:00 0.5 mg via ORAL
  Filled 2019-05-16 (×2): qty 1

## 2019-05-16 NOTE — Consult Note (Signed)
Consultation Note Date: 05/16/2019   Patient Name: Derrick Butler  DOB: 28-Aug-1956  MRN: [id number]  Age / Sex: 64 y.o., male  PCP: Hilbert Corrigan, MD Referring Physician: Collene Gobble, MD  Reason for Consultation: Establishing goals of care  HPI/Patient Profile: 63 y.o. male  with past medical history of aortic aneurysm dissection s/p graft, CVA, dementia- residing at [facility], CKD  admitted on 05/14/2019 with GIB, UTI and possible aspiration PNA. Further workup during admission found widening aneurysm with possible leak. During admission he has been hypotensive, but this has stabilized. Per discussion with his HCPOA- they have decided not to pursue further treatment or diagnostics for his aortic aneurysm. He continues to be treated for PNA and UTI, had an SLP eval today showed no overt signs of aspiration on full liquid, however, he remains very disoriented and confused and is taking in only limited amounts of PO. His Cr is trending up.   Clinical Assessment and Goals of Care: Evaluated patient- he was nonresponsive.   I called and spoke with his Prince. Reviewed chart with her and answered questions.   For now she would like to continue IV fluids and IV antibiotics, would not want further invasive diagnostics or invasive procedures for his possibly leaking graft- his bp and hgb are currently stable.   We discussed the meaning of transition to full comfort measures. She is not yet ready for this, however, if patient were to decline further, or if he does not return to mental status where he can take in food and hydration- then she would consider transition to full comfort. Her hope is that with treatment of UTI, his mental status will improve. She would not have a feeding tube placed.  We also discussed possibility that he is aspirating and that if so then pneumonia may occur again. She noted that  if this happened- she would likely prefer comfort measures only with symptomatic treatment and allowing for natural death.  Primary Decision Maker HCPOA- Glenda    SUMMARY OF RECOMMENDATIONS -Continue current care with IV fluids, IV antibiotics, no escalation, no invasive diagnostics or procedures -No feeding tube -PMT will re-eval tomorrow and discuss further with HCPOA -If patient survives this hospitalization would recommend d/c to facility with Hospice   Code Status/Advance Care Planning:  DNR  Prognosis:    < 6 months  Discharge Planning: To Be Determined  Primary Diagnoses: Present on Admission: . GI bleed   I have reviewed the medical record, interviewed the patient and family, and examined the patient. The following aspects are pertinent.  Past Medical History:  Diagnosis Date  . Agitation    and aggression  . Altered mental status    with psychosis  . Anemia   . Aortic dissection ([place])   . Aortic dissection ([place])   . Bipolar 1 disorder ([place])   . Cardiomyopathy   . Cardiomyopathy ([place])   . CKD (chronic kidney disease)   . Coronary artery disease   . CVA (cerebral infarction)   .  CVA (cerebral infarction)   . Dehydration   . Diabetes mellitus   . Dysphagia   . Encephalopathy   . Hyperglycemia   . Hyperkalemia   . Hyperlipemia   . Hypertension   . Hypertension   . Hyponatremia   . Lack of coordination   . Poor historian   . Psychosis ([place])   . Psychosis ([place])   . Thrombocytopenia ([place])   . TIA (transient ischemic attack)    Social History   Socioeconomic History  . Marital status: Single    Spouse name: Not on file  . Number of children: Not on file  . Years of education: Not on file  . Highest education level: Not on file  Occupational History  . Not on file  Social Needs  . Financial resource strain: Not on file  . Food insecurity    Worry: Not on file    Inability: Not on file  . Transportation needs    Medical: Not on file     Non-medical: Not on file  Tobacco Use  . Smoking status: Never Smoker  . Smokeless tobacco: Never Used  Substance and Sexual Activity  . Alcohol use: No    Alcohol/week: 0.0 standard drinks  . Drug use: No  . Sexual activity: Never  Lifestyle  . Physical activity    Days per week: Not on file    Minutes per session: Not on file  . Stress: Not on file  Relationships  . Social Herbalist on phone: Not on file    Gets together: Not on file    Attends religious service: Not on file    Active member of club or organization: Not on file    Attends meetings of clubs or organizations: Not on file    Relationship status: Not on file  Other Topics Concern  . Not on file  Social History Narrative  . Not on file   Family History  Problem Relation Age of Onset  . Alzheimer's disease Mother   . Stroke Father   . Heart disease Father   . Bipolar disorder Father   . Bipolar disorder Sister   . Bipolar disorder Sister   . Colon cancer Neg Hx    Scheduled Meds: . atorvastatin  20 mg Oral q1800  . Chlorhexidine Gluconate Cloth  6 each Topical Q0600  . cholecalciferol  1,000 Units Oral Daily  . divalproex  500 mg Oral TID  . insulin aspart  0-15 Units Subcutaneous TID WC  . LORazepam  0.5 mg Intravenous Q6H  . mupirocin ointment  1 application Nasal BID  . omega-3 acid ethyl esters  1 g Oral BID  . pantoprazole (PROTONIX) IV  40 mg Intravenous Q24H  . QUEtiapine  100 mg Oral BID  . risperiDONE  0.5 mg Oral QHS  . trihexyphenidyl  5 mg Oral BID WC   Continuous Infusions: . piperacillin-tazobactam (ZOSYN)  IV 3.375 g (05/16/19 1433)   PRN Meds:.acetaminophen **OR** acetaminophen, ondansetron **OR** ondansetron (ZOFRAN) IV Medications Prior to Admission:  Prior to Admission medications   Medication Sig Start Date End Date Taking? Authorizing Provider  acetaminophen (TYLENOL) 325 MG tablet Take 650 mg by mouth every 6 (six) hours as needed for mild pain, moderate pain or  fever.   Yes [provider]  aspirin EC 81 MG tablet Take 81 mg by mouth daily. (0900)   Yes [provider]  atorvastatin (LIPITOR) 20 MG tablet Take 20 mg by mouth  daily at 6 PM. (1700)   Yes [provider]  Biotin 5 MG TBDP Take 30 mg by mouth daily. (0900)   Yes [provider]  cholecalciferol (VITAMIN D3) 25 MCG (1000 UT) tablet Take 1,000 Units by mouth daily. (0900)   Yes [provider]  divalproex (DEPAKOTE SPRINKLE) 125 MG capsule Take 500 mg by mouth 3 (three) times daily. (0900, 1300, & 1700)   Yes [provider]  famotidine (PEPCID) 20 MG tablet Take 20 mg by mouth 2 (two) times daily. (0900 & 2000)   Yes [provider]  fluticasone (FLONASE) 50 MCG/ACT nasal spray Place 2 sprays into both nostrils daily. (0900)   Yes [provider]  Insulin Detemir (LEVEMIR FLEXPEN) 100 UNIT/ML Pen Inject 5 Units into the skin daily at 10 pm.    Yes [provider]  labetalol (NORMODYNE) 100 MG tablet Take 1 tablet (100 mg total) by mouth 2 (two) times daily. Patient taking differently: Take 100 mg by mouth 2 (two) times daily. (0900 & 2100) 03/18/19  Yes Josue Hector, MD  omega-3 acid ethyl esters (LOVAZA) 1 g capsule Take 1 g by mouth 2 (two) times daily. (0900 & 2100)   Yes [provider]  QUEtiapine (SEROQUEL) 300 MG tablet Take 300 mg by mouth 2 (two) times daily. (0900 & 2100)   Yes [provider]  risperiDONE (RISPERDAL) 1 MG tablet Take 1 tablet (1 mg total) by mouth 3 (three) times daily. 06/05/13  Yes Cloria Spring, MD  trihexyphenidyl (ARTANE) 5 MG tablet Take 5 mg by mouth 2 (two) times daily with a meal. (0900 & 2100)   Yes [provider]   No Known Allergies Review of Systems  Physical Exam  Vital Signs: BP 126/79   Pulse (!) 53   Temp (!) 96.3 F (35.7 C) (Oral)   Resp 15   Wt 76.5 kg   SpO2 (!) 88%   BMI 27.22 kg/m  Pain Scale: 0-10   Pain Score: 0-No  pain   SpO2: SpO2: (!) 88 % O2 Device:SpO2: (!) 88 % O2 Flow Rate: .O2 Flow Rate (L/min): 3 L/min  IO: Intake/output summary:   Intake/Output Summary (Last 24 hours) at 05/16/2019 1806 Last data filed at 05/16/2019 0715 Gross per 24 hour  Intake 829.32 ml  Output 700 ml  Net 129.32 ml    LBM: Last BM Date: 05/14/19 Baseline Weight: Weight: 77 kg Most recent weight: Weight: 76.5 kg     Palliative Assessment/Data: PPS: 10%     Thank you for this consult. Palliative medicine will continue to follow and assist as needed.   Time In: 1000, 1715 Time Out: 1030, 1600 Time Total:75 minutes Greater than 50%  of this time was spent counseling and coordinating care related to the above assessment and plan.  Signed by: Mariana Kaufman, AGNP-C Palliative Medicine    Please contact Palliative Medicine Team phone at [phone] for questions and concerns.  For individual provider: See Shea Evans

## 2019-05-16 NOTE — Progress Notes (Signed)
eLink Physician-Brief Progress Note Patient Name: GOVINDA RABE DOB: September 12, 1955 MRN: [id number]   Date of Service  05/16/2019  HPI/Events of Note  Pt  Transferred from [name] to [facility] due to acute coronary syndrome against a background of acute GI bleeding, acute blood loss anemia.  eICU Interventions  New patient evaluation completed, stat cardiology consultation  Requested.        Kerry Kass Sonita Michiels 05/16/2019, 3:36 AM

## 2019-05-16 NOTE — Consult Note (Signed)
[address]       [place] [address]             [phone]                    Derrick Butler [place] Medical Record [id number] Date of Birth: 06-12-1956  Referring: No ref. provider found Primary Care: Hilbert Corrigan, MD Primary Cardiologist: No primary care provider on file.  Chief Complaint:    Chief Complaint  Patient presents with  . unresponsive    History of Present Illness:    Derrick Butler 63 y.o. male type I  aortic dissection repair with Dr. 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  He originally presented with altered mental status and GI bleed.  He underwent CT scan which showed a 7 cm ascending aneurysm.   CTS was consulted to assist in management.  Of note the family and caregivers for the patient do not want any aggressive measures.  He has been living in a nursing home and has a history of a stroke along with dementia.    Past Medical History:  Diagnosis Date  . Agitation    and aggression  . Altered mental status    with psychosis  . Anemia   . Aortic dissection ([place])   . Aortic dissection ([place])   . Bipolar 1 disorder ([place])   . Cardiomyopathy   . Cardiomyopathy ([place])   . CKD (chronic kidney disease)   . Coronary artery disease   . CVA (cerebral infarction)   . CVA (cerebral infarction)   . Dehydration   . Diabetes mellitus   . Dysphagia   . Encephalopathy   . Hyperglycemia   . Hyperkalemia   . Hyperlipemia   . Hypertension   . Hypertension   . Hyponatremia   . Lack of coordination   . Poor historian   . Psychosis ([place])   . Psychosis ([place])   . Thrombocytopenia ([place])   . TIA (transient ischemic attack)     Past Surgical History:  Procedure Laterality Date  . ASCENDING AORTIC ANEURYSM REPAIR    . BIOPSY  12/21/2015   Procedure: BIOPSY;  Surgeon: Danie Binder, MD;  [facility];  Service: Endoscopy;;  gastric biopsy  . BIOPSY   05/13/2019   Procedure: BIOPSY;  Surgeon: Danie Binder, MD;  [facility];  Service: Endoscopy;;  rectum  . COLONOSCOPY  05/13/2019   Dr. Oneida Alar: Incomplete due to poor bowel prep.  2 polyps removed via cold snare, one from the hepatic flexure and one from the proximal ascending colon.  Red blood noted in the rectum, status post biopsy.  External hemorrhoids.  Repeat colonoscopy in 3 months due to poor prep.  Biopsy showed tubular adenomas, rectal biopsy consistent with ulcer  . COLONOSCOPY WITH PROPOFOL N/A 12/21/2015   [facility]: one 25mm polyp in the ascending colon , removed with a cold biopsy forceps. resectedand retrieved.  four 6- 8 mm polyps in the rectum , in the ascending colon and in the cedum, removed with a hot snare. resected and retrieved.  . COLONOSCOPY WITH PROPOFOL N/A 05/13/2019   Procedure: COLONOSCOPY WITH PROPOFOL;  Surgeon: Danie Binder, MD;  [facility];  Service: Endoscopy;  Laterality: N/A;  10:30am  . ESOPHAGOGASTRODUODENOSCOPY (EGD) WITH PROPOFOL N/A 12/21/2015   [facility]: Web in the distal esophagus dilated. Erosive  gastropathy, biopsied normal duodenal bulb and second portion of the duodenum.   [name] FLEXIBLE SIGMOIDOSCOPY N/A 05/15/2019   Procedure: FLEXIBLE SIGMOIDOSCOPY;  Surgeon: Danie Binder, MD;  [facility];  Service: Endoscopy;  Laterality: N/A;  . POLYPECTOMY  12/21/2015   Procedure: POLYPECTOMY;  Surgeon: Danie Binder, MD;  [facility];  Service: Endoscopy;;  at cecum and ascending colon; rectum  . POLYPECTOMY  05/13/2019   Procedure: POLYPECTOMY;  Surgeon: Danie Binder, MD;  [facility];  Service: Endoscopy;;  colon  . SAVORY DILATION N/A 12/21/2015   Procedure: SAVORY DILATION;  Surgeon: Danie Binder, MD;  [facility];  Service: Endoscopy;  Laterality: N/A;    Family History  Problem Relation Age of Onset  . Alzheimer's disease Mother   . Stroke Father   . Heart disease Father   . Bipolar  disorder Father   . Bipolar disorder Sister   . Bipolar disorder Sister   . Colon cancer Neg Hx      Social History   Tobacco Use  Smoking Status Never Smoker  Smokeless Tobacco Never Used    Social History   Substance and Sexual Activity  Alcohol Use No  . Alcohol/week: 0.0 standard drinks     No Known Allergies  Current Facility-Administered Medications  Medication Dose Route Frequency Provider Last Rate Last Dose  . acetaminophen (TYLENOL) tablet 650 mg  650 mg Oral Q6H PRN Fields, Sandi L, MD       Or  . acetaminophen (TYLENOL) suppository 650 mg  650 mg Rectal Q6H PRN Fields, Sandi L, MD      . atorvastatin (LIPITOR) tablet 20 mg  20 mg Oral q1800 Fields, Sandi L, MD   20 mg at 05/15/19 1828  . Chlorhexidine Gluconate Cloth 2 % PADS 6 each  6 each Topical Q0600 Danie Binder, MD   6 each at 05/16/19 0558  . cholecalciferol (VITAMIN D3) tablet 1,000 Units  1,000 Units Oral Daily Danie Binder, MD   1,000 Units at 05/15/19 1543  . divalproex (DEPAKOTE SPRINKLE) capsule 500 mg  500 mg Oral TID Oneida Alar, Sandi L, MD   500 mg at 05/15/19 1832  . insulin aspart (novoLOG) injection 0-15 Units  0-15 Units Subcutaneous TID WC Fields, Sandi L, MD      . LORazepam (ATIVAN) injection 0.5 mg  0.5 mg Intravenous Q6H Corey Harold, NP   0.5 mg at 05/16/19 0556  . mupirocin ointment (BACTROBAN) 2 % 1 application  1 application Nasal BID Danie Binder, MD   1 application at [id number] 1000  . omega-3 acid ethyl esters (LOVAZA) capsule 1 g  1 g Oral BID Barney Drain L, MD   1 g at 05/16/19 0008  . ondansetron (ZOFRAN) tablet 4 mg  4 mg Oral Q6H PRN Fields, Sandi L, MD       Or  . ondansetron (ZOFRAN) injection 4 mg  4 mg Intravenous Q6H PRN Fields, Sandi L, MD      . pantoprazole (PROTONIX) injection 40 mg  40 mg Intravenous Q24H Corey Harold, NP      . piperacillin-tazobactam (ZOSYN) IVPB 3.375 g  3.375 g Intravenous Q8H Fields, Sandi L, MD 12.5 mL/hr at 05/16/19 0600    .  QUEtiapine (SEROQUEL) tablet 100 mg  100 mg Oral BID Scatliffe, Kristen D, MD      . risperiDONE (RISPERDAL M-TABS) disintegrating tablet 0.5 mg  0.5 mg Oral QHS  Scatliffe, Rise Paganini, MD      . trihexyphenidyl (ARTANE) tablet 5 mg  5 mg Oral BID WC Fields, Sandi L, MD   5 mg at 05/16/19 0015    Review of Systems  Unable to perform ROS: Mental status change    PHYSICAL EXAMINATION: BP (!) 122/58   Pulse 71   Temp (!) 96.8 F (36 C) (Oral)   Resp 12   Wt 76.5 kg   SpO2 100%   BMI 27.22 kg/m   Physical Exam  Constitutional: No distress.  HENT:  Head: Normocephalic and atraumatic.  Cardiovascular: Normal rate.  Murmur heard. Pulmonary/Chest: Effort normal and breath sounds normal. No respiratory distress.  Abdominal: Soft.  Skin: Skin is warm and dry.     Diagnostic Studies & Laboratory data:     Recent Radiology Findings:   Ct Angio Chest Pe W Or Wo Contrast  Result Date: 05/15/2019 CLINICAL DATA:  Shortness of breath.  Interstitial lung disease. EXAM: CT ANGIOGRAPHY CHEST WITH CONTRAST TECHNIQUE: Multidetector CT imaging of the chest was performed using the standard protocol during bolus administration of intravenous contrast. Multiplanar CT image reconstructions and MIPs were obtained to evaluate the vascular anatomy. CONTRAST:  29mL OMNIPAQUE IOHEXOL 350 MG/ML SOLN COMPARISON:  12/08/2009 FINDINGS: Cardiovascular: Patient is status post repair of ascending thoracic aortic aneurysm with tube graft. The aneurysm sac is enlarged since prior study measuring 7.0 x 6.6 cm. This measured up to 4.7 cm previously. There appears to be narrowing of the tube graft, question related to mass effect aneurysmal dilatation of the proximal descending thoracic aorta measuring up to 4.8 cm. This is stable. Cardiomegaly. No filling defects in the pulmonary arteries to suggest pulmonary emboli. Mediastinum/Nodes: Small scattered mediastinal lymph nodes. No mediastinal, hilar, or axillary adenopathy.  Lungs/Pleura: Large right pleural effusion and small to moderate left pleural effusion. Ground-glass opacities throughout both lungs, likely edema. Upper Abdomen: Imaging into the upper abdomen shows no acute findings. Musculoskeletal: Chest wall soft tissues are unremarkable. No acute bony abnormality. Review of the MIP images confirms the above findings. IMPRESSION: Prior tube repair of the ascending thoracic aorta. The aneurysm sac as enlarged since prior study measuring 7.0 x 6.6 cm. This measured up to 4.7 cm previously. Findings concerning for graft leak. There is also compressed appearance of the graft lumen suggesting mass effect. 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 Cardiomegaly, coronary artery disease. Large right pleural effusion and moderate left pleural effusion. Ground-glass opacities within the lungs likely reflect edema. Electronically Signed   By: Rolm Baptise M.D.   On: 05/15/2019 22:29   Dg Chest Portable 1 View  Result Date: 05/14/2019 CLINICAL DATA:  Weakness EXAM: PORTABLE CHEST 1 VIEW COMPARISON:  04/13/2017 FINDINGS: Prior median sternotomy. Cardiomegaly with vascular congestion. Interstitial prominence likely reflects interstitial edema. No visible effusions or pneumothorax. No acute bony abnormality. IMPRESSION: Cardiomegaly with vascular congestion and probable early edema/CHF. Electronically Signed   By: Rolm Baptise M.D.   On: 05/14/2019 19:17       I have independently reviewed the above radiology studies  and reviewed the findings with the patient.   Recent Lab Findings: Lab Results  Component Value Date   WBC 5.5 05/16/2019   HGB 9.6 (L) 05/16/2019   HCT 29.5 (L) 05/16/2019   PLT 44 (L) 05/16/2019   GLUCOSE 131 (H) 05/16/2019   ALT 19 05/16/2019   AST 48 (H) 05/16/2019   NA 138 05/16/2019  K 5.0 05/16/2019   CL 107 05/16/2019   CREATININE 3.07 (H) 05/16/2019   BUN 44 (H) 05/16/2019   CO2 21 (L)  05/16/2019   TSH 0.883 09/07/2013   INR 1.5 (H) 05/14/2019   HGBA1C 6.0 (H) 05/14/2019     Assessment / Plan:    63 year-old male with history of type I aortic dissection repair in 2012 now with aneurysm degeneration involving his ascending, arch and descending aorta.  For repair require asymptomatic placement of a frozen elephant trunk with anterograde stent.  This would be a very high risk operation in a chronically ill and fragile patient.  Given the family's decisions transition to comfort care measures surgery will not be offered at this point.      Lajuana Matte 05/16/2019 12:05 PM

## 2019-05-16 NOTE — Progress Notes (Signed)
Initial Nutrition Assessment  DOCUMENTATION CODES:   Not applicable  INTERVENTION:   - Will follow for SLP evaluation and diet advancement and supplement as appropriate  NUTRITION DIAGNOSIS:   Inadequate oral intake related to lethargy/confusion as evidenced by NPO status.  GOAL:   Patient will meet greater than or equal to 90% of their needs  MONITOR:   Diet advancement, Labs, Weight trends, Skin, I & O's  REASON FOR ASSESSMENT:   Malnutrition Screening Tool    ASSESSMENT:   63 year old male with PMH of CVA with residual slurred speech, thrombocytopenia, HTN, HLD, T2DM, CAD s/p CABG, dementia, and bipolar disorder. Pt presented to the ED from SNF for SOB, confusion, and rectal bleeding. Pt admitted with GI bleed. Pt found to have ascending aortic aneurysm.   9/10 - s/p flexible sigmoidoscopy with MAC  Discussed pt with RN and during ICU rounds. Per RN, pt is not full comfort care at this time.  GI bleed felt to be due to a rectal lesion. Per CVTS, pt is not a candidate for surgery.  Pt currently on full liquids. Per SLP note, would hold PO diet in the setting of lethargy. RN to contact SLP if pt becomes more alert later today.  Upon RD visit, pt was lethargic and did not awaken to RD touch or voice. Unable to obtain diet and weight history at this time.  Reviewed weight history in chart. Pt with a 5.1 kg weight loss since November 2019. This is a 6.3% weight loss which is not significant for timeframe.  Medications reviewed and include: cholecalciferol, SSI, Lovaza, Protonix, IV abx  Labs reviewed: BUN 44, creatinine 3.07, hemoglobin 9.6 CBG's: 110-183 x 24 hours  NUTRITION - FOCUSED PHYSICAL EXAM:  Deferred.  Diet Order:   Diet Order            Diet full liquid Room service appropriate? Yes; Fluid consistency: Thin  Diet effective now              EDUCATION NEEDS:   Not appropriate for education at this time  Skin:  Skin Assessment: Reviewed RN  Assessment  Last BM:  05/14/19  Height:   Ht Readings from Last 1 Encounters:  03/18/19 _0  (1.676 m)    Weight:   Wt Readings from Last 1 Encounters:  05/16/19 76.5 kg    Ideal Body Weight:  64.5 kg  BMI:  Body mass index is 27.22 kg/m.  Estimated Nutritional Needs:   Kcal:  1700-1900  Protein:  85-100 grams  Fluid:  1.7-1.9 L    Gaynell Face, MS, RD, LDN Inpatient Clinical Dietitian Pager: [phone] Weekend/After Hours: [phone]

## 2019-05-16 NOTE — Progress Notes (Signed)
PCCM interval note  Interval events: Chart reviewed, in particular conversations overnight with patient's legal guardian. Patient is hemodynamically improved, stable.  Heparin (for possible PE) discontinued No evidence of rectal bleeding at present  Vitals:   05/16/19 0645 05/16/19 0700 05/16/19 0715 05/16/19 0730  BP: (!) 85/60 94/60 103/61   Pulse: 74 70 67   Resp:      Temp:    97.6 F (36.4 C)  TempSrc:      SpO2: 99% 100% 99%   Weight:      Ill-appearing elderly man, in no distress.  Eyes closed, he will open them and interact some but he is clearly confused, poorly oriented.  He moves his extremities with good strength.  Clear bilateral breath sounds.  Heart is regular without a murmur.  Abdomen is soft, nondistended with positive bowel sounds.  He has trace lower extremity edema  GI bleeding with associated transient hypotension, now hemodynamically stable.  The GI bleed was felt to be due to a rectal lesion, tear.  Question related to enema versus recent polypectomy.  Flexible sigmoidoscopy with epinephrine and clipping prior to transfer, no further bleeding noted.  Plan for conservative management.  Hemoglobin stable, plan to follow intermittently with CBC  Stress non-STEMI in the setting of above.  Heparin deferred given the recent rectal blood.  Baseline dementia with some associated agitated delirium during this hospitalization and episode.  His Depakote, Seroquel and respite all are currently on hold as he has not been taking p.o.  He likely needs to have these restarted as soon as he is able to tolerate p.o.  UTI, possible aspiration pneumonia.  Currently on Zosyn.  It was seem reasonable to treat medically as there is no associated discomfort with IV antibiotics.  May need to clarify with his guardian as to whether she would want a transition to full comfort, stop any treatment medications.  At this point I am going to continue.  History of an ascending aortic aneurysm with  prior graft by TCTS, now with evidence of widening in probable graft leak on CT chest.  Not clear that this is contributing to his current clinical presentation.  Based on his guardian's wishes would defer pursuing this further.  Spoke with Dr. Kipp Brood today.  He will review the chart, but presumed that there is no invasive intervention that needs to be made.  Agree with transition out of the ICU and to telemetry. We will ask TR[facility] to assume his care as of 9/12.  Baltazar Apo, MD, PhD 05/16/2019, 9:27 AM [facility] [phone] or if no answer [phone]

## 2019-05-16 NOTE — Progress Notes (Signed)
Pt off the floor with [facility] transporting to [facility]

## 2019-05-16 NOTE — Progress Notes (Signed)
SLP Cancellation Note  Patient Details Name: Derrick Butler MRN: [id number] DOB: 05-23-1956   Cancelled treatment:       Reason Eval/Treat Not Completed: Fatigue/lethargy limiting ability to participate. Discussed with RN who clarified that pt is not full comfort care at this time; however, he is also lethargic and not very interactive this morning, therefore also likely not ready for a swallow evaluation. Will hold PO trials for now (note that he does have a PO diet ordered - would hold in the setting of lethargy), but RN will contact SLP if the pt becomes more alert later today.   Derrick Butler 05/16/2019, 9:39 AM  Pollyann Glen, M.A. [facility] Acute Environmental education officer [phone] Office [phone]

## 2019-05-16 NOTE — Progress Notes (Signed)
Called report to [place] at [facility]

## 2019-05-16 NOTE — Procedures (Addendum)
Patient Name: DENARIO BAIK  MRN: [id number]  Epilepsy Attending: Lora Havens  Referring Physician/Provider: Dr Dannielle Huh Date: 05/16/2019 Duration: 24.04 mins  Patient history: 63yo M with ams. EEG to evaluate for seizure.  Level of alertness: lethargic  AEDs during EEG study: VPA, ativan  Technical aspects: This EEG study was done with scalp electrodes positioned according to the 10-20 International system of electrode placement. Electrical activity was acquired at a sampling rate of 500Hz  and reviewed with a high frequency filter of 70Hz  and a low frequency filter of 1Hz . EEG data were recorded continuously and digitally stored.   DESCRIPTION: EEG showed continuous generalized 2-5hz  theta-delta slowing. EEG was reactive to tactile stimulation. No clear posterior dominant rhythm was seen. Hyperventilation and photic stimulation were not performed.  IMPRESSION: This study is suggestive of moderate to severe diffuse encephalopathy. No seizures or epileptiform discharges were seen throughout the recording.  Kamareon Sciandra Barbra Sarks

## 2019-05-16 NOTE — Progress Notes (Signed)
I was paged by mistake (not on call for general cardiology). Asked to opine if he can be started on pressors for hemorrhagic shock in view of AAA. I reviewed the chart and do not see any contraindication for the same as treatment of primary underlying issues and hemodynamic stabilization was principal. Patient not in pulmonary edema in spite of low LVEF. No indication for coronary angiogram in view of active GI bleed. Please call General cardiology [facility]) on call (patient established with Dr. Johnsie Cancel)  if questions for management. I have reviewed the chart but not examined the patient.

## 2019-05-16 NOTE — Progress Notes (Deleted)
Nutrition Brief Note  Chart reviewed. Pt now transitioning to comfort care.  No further nutrition interventions warranted at this time.  Please re-consult as needed.   Caedon Bond A. Daena Alper, RD, LDN, CDCES Registered Dietitian II Certified Diabetes Care and Education Specialist Pager: [phone] After hours Pager: [phone]  

## 2019-05-16 NOTE — H&P (Signed)
NAME:  Derrick Butler, MRN:  [id number], DOB:  08/04/1956, LOS: 2 ADMISSION DATE:  05/14/2019, CONSULTATION DATE:  9/11 REFERRING MD:  Dr. Clearence Ped [name]), CHIEF COMPLAINT:  ACS  Brief History   63 year old male with complex medical history including CVA and dementia who resides in SNF admitted to [facility] for GIB, UTI, and aspiration on 9/9. After decompensation on 9/10 he was transferred to [facility] for presumed ACS and enlarged known ascending thoracic aortic aneurysm.   History of present illness   63 year old male with PMH as below, which is significant for CVA, dementia, CAD s/p CABG, AF recently taken off Eliquis, CKD, DM, bipolar disorder, and psychosis. He has reportedly been living in SNF for 10 years due to CVA and dementia. Unclear what his baseline level of cognitive function is. He has had some lower GI bleeding recently and went for colonoscopy on 9/8, but bowel prep was poor and it was not completed. Two polyps were removed. Then 9/9 he was noted to have some mild GI bleeding. Later that day he was found slumped over a chair and was sent to ER, where he was admitted to GIB (hgb was stable), UTI, and possible aspiration PNA.   In the PM hours of 9.9 he became hypotensive and hypoxemic requiring transfer to ICU. Largely resolved spontaneously. CTA chest done to rule out PE described enlarged aneurysm sack of previous ascending aortic aneurysm concerning for graft leak. Troponin elevated concerning for ACS. EKG with ST abnormality and TWI not different than prior EKG. Transferred to [facility] for ICU and mission and CVTS evaluation.  Past Medical History   has a past medical history of Agitation, Altered mental status, Anemia, Aortic dissection (HCC), Aortic dissection (HCC), Bipolar 1 disorder ([place]), Cardiomyopathy, Cardiomyopathy ([place]), CKD (chronic kidney disease), Coronary artery disease, CVA (cerebral infarction), CVA (cerebral infarction), Dehydration, Diabetes mellitus,  Dysphagia, Encephalopathy, Hyperglycemia, Hyperkalemia, Hyperlipemia, Hypertension, Hypertension, Hyponatremia, Lack of coordination, Poor historian, Psychosis ([place]), Psychosis ([place]), Thrombocytopenia ([place]), and TIA (transient ischemic attack).  Significant Hospital Events   9/9 admit 9/11 transfer to [facility]:  Cardiology 9/11 > CVTS 9/11 >  Procedures:  Colonoscopy 9/10 > lesion in rectum felt to be enema related not causing significant bleeding.   Significant Diagnostic Tests:  CTA chest 9/11 > Prior tube repair of the ascending thoracic aorta. The aneurysm sac as enlarged since prior study measuring 7.0 x 6.6 cm. This measured up to 4.7 cm previously. Findings concerning for graft leak. There is also compressed appearance of the graft lumen suggesting mass effect. 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 Large right pleural effusion and moderate left pleural effusion.  Echo 9/10 > LVEF 30-35% LV diffuse HK, akinetic apex.   Micro Data:  Blood 9/9 > Urine 9/9 >  Antimicrobials:  Zosyn 9/9 > Vancomycin 9/9 >  Interim history/subjective:    Objective   Blood pressure (!) 80/49, pulse 82, temperature 97.7 F (36.5 C), temperature source Axillary, resp. rate (!) 21, weight 77 kg, SpO2 100 %.        Intake/Output Summary (Last 24 hours) at 05/16/2019 0357 Last data filed at 05/16/2019 0200 Gross per 24 hour  Intake 1194.36 ml  Output 700 ml  Net 494.36 ml   Filed Weights   05/14/19 2237  Weight: 77 kg    Examination: General: elderly appearing male with waxing/waning mental status HENT: [place]/AT, PERRL, no JVD Lungs: Clear,  bilateral breath sounds Cardiovascular: IRIR, regular rate, 4/6 murmur Abdomen: Soft, non-tender, non-distended Extremities: No acute deformity. Moving all extremities.  Neuro: lethargic at times, agitated at times. Will occasionally answer yes/no questions.   Resolved Hospital  Problem list     Assessment & Plan:   Elevated troponin: continuing to trend up. Anticoagulation limited by GI bleed - Hold off on [facility] for now.  - Trend troponin - Palliative consult at family request, will defer card consult in this setting.   Ascending aortic aneurysm: history of repair prior, now wider on CT concerning for graft leak. Doubt he is a candidate for surgical repair based on CVA/dementia history.  - Legal guardian suggesting palliative care, will defer CVTS consult - BP control SBP ~120  Agitated delirium: with history of dementia, bipolar disorder, and psychosis. On several psychoactive medications as outpatient.  - holding oral meds depakote, seroquel, risperdal - PRN haldol  Acute on on chronic HFrEF:  - Diuresed at [facility], Bp soft now so  Limiting  DM - SSI - Hold home levemir as he is NPO  UTI Aspiration PNA - Continue antibiotics - Follow cultures  GIB: rectal lesion felt secondary to enema - GI was following at [facility], if further need, will need to consult GI here.  Best practice:  Diet: NPO Pain/Anxiety/Delirium protocol (if indicated): PRN VAP protocol (if indicated): NA DVT prophylaxis: SCDs GI prophylaxis: Protonix IV Glucose control: SSI Mobility: BR Code Status: DNR Family Communication: Legal guardian updated.  Disposition: Transfer to med surg  Labs   CBC: Recent Labs  Lab 05/14/19 1756 05/14/19 1801 05/14/19 2327 05/15/19 0443 05/15/19 0850 05/15/19 1526  WBC 9.2  --   --  4.7  --  5.2  NEUTROABS 3.9  --   --   --   --  3.3  HGB 10.8* 10.5* 9.7* 9.2* 10.0* 9.9*  9.8*  HCT 35.3* 31.0* 30.3* 28.0* 30.6* 30.8*  30.9*  MCV 104.1*  --   --  97.2  --  99.4  PLT 72*  --   --  43*  --  49*    Basic Metabolic Panel: Recent Labs  Lab 05/13/19 1033 05/14/19 1756 05/14/19 1801 05/15/19 0443  NA 143 138 139 138  K 5.2* 5.1 5.2* 4.5  CL 107 103 106 112*  CO2 23 16*  --  19*  GLUCOSE 73 193* 177* 132*  BUN 46* 44* 40* 43*   CREATININE 2.35* 2.78* 2.60* 2.22*  CALCIUM 8.7* 8.4*  --  7.4*   GFR: Estimated Creatinine Clearance: 33.7 mL/min (A) (by C-G formula based on SCr of 2.22 mg/dL (H)). Recent Labs  Lab 05/14/19 1756 05/14/19 2104 05/15/19 0443 05/15/19 1526 05/15/19 2058 05/15/19 2300  PROCALCITON  --   --  0.45  --   --   --   WBC 9.2  --  4.7 5.2  --   --   LATICACIDVEN >11.0* 3.3*  --   --  6.6* 3.8*    Liver Function Tests: Recent Labs  Lab 05/14/19 1756 05/15/19 0443  AST 37 28  ALT 14 14  ALKPHOS 50 41  BILITOT 0.8 0.7  PROT 5.3* 4.4*  ALBUMIN 3.0* 2.5*   No results for input(s): LIPASE, AMYLASE in the last 168 hours. No results for input(s): AMMONIA in the last 168 hours.  ABG    Component Value Date/Time   PHART 7.391 01/10/2016 1300   PCO2ART 37.4 01/10/2016 1300   PO2ART 59.5 (L) 01/10/2016 1300  HCO3 22.7 01/10/2016 1300   TCO2 17 (L) 05/14/2019 1801   ACIDBASEDEF 2.0 01/10/2016 1300   O2SAT 90.3 01/10/2016 1300     Coagulation Profile: Recent Labs  Lab 05/14/19 1756  INR 1.5*    Cardiac Enzymes: No results for input(s): CKTOTAL, CKMB, CKMBINDEX, TROPONINI in the last 168 hours.  HbA1C: Hgb A1c MFr Bld  Date/Time Value Ref Range Status  05/14/2019 09:04 PM 6.0 (H) 4.8 - 5.6 % Final    Comment:    (NOTE)         Prediabetes: 5.7 - 6.4         Diabetes: >6.4         Glycemic control for adults with diabetes: <7.0   09/09/2013 10:33 AM 6.1 (H) <5.7 % Final    Comment:    (NOTE)                                                                       According to the ADA Clinical Practice Recommendations for 2011, when HbA1c is used as a screening test:  >=6.5%   Diagnostic of Diabetes Mellitus           (if abnormal result is confirmed) 5.7-6.4%   Increased risk of developing Diabetes Mellitus References:Diagnosis and Classification of Diabetes Mellitus,Diabetes [id number] 1):S62-S69 and Standards of Medical Care in         Diabetes -  2011,Diabetes [id number] (Suppl 1):S11-S61.    CBG: Recent Labs  Lab 05/14/19 2256 05/15/19 0952 05/15/19 1150 05/15/19 1724 05/15/19 1955  GLUCAP 152* 112* 123* 120* 183*    Review of Systems:   Unable as patient is encephalopathic  Past Medical History  He,  has a past medical history of Agitation, Altered mental status, Anemia, Aortic dissection (HCC), Aortic dissection ([place]), Bipolar 1 disorder ([place]), Cardiomyopathy, Cardiomyopathy ([place]), CKD (chronic kidney disease), Coronary artery disease, CVA (cerebral infarction), CVA (cerebral infarction), Dehydration, Diabetes mellitus, Dysphagia, Encephalopathy, Hyperglycemia, Hyperkalemia, Hyperlipemia, Hypertension, Hypertension, Hyponatremia, Lack of coordination, Poor historian, Psychosis ([place]), Psychosis ([place]), Thrombocytopenia ([place]), and TIA (transient ischemic attack).   Surgical History    Past Surgical History:  Procedure Laterality Date  . ASCENDING AORTIC ANEURYSM REPAIR    . BIOPSY  12/21/2015   Procedure: BIOPSY;  Surgeon: Danie Binder, MD;  Location: [facility];  Service: Endoscopy;;  gastric biopsy  . COLONOSCOPY  05/13/2019   Dr. Oneida Alar: Incomplete due to poor bowel prep.  2 polyps removed via cold snare, one from the hepatic flexure and one from the proximal ascending colon.  Red blood noted in the rectum, status post biopsy.  External hemorrhoids.  Repeat colonoscopy in 3 months due to poor prep.  Biopsy showed tubular adenomas, rectal biopsy consistent with ulcer  . COLONOSCOPY WITH PROPOFOL N/A 12/21/2015   SLF: one 51mm polyp in the ascending colon , removed with a cold biopsy forceps. resectedand retrieved.  four 6- 8 mm polyps in the rectum , in the ascending colon and in the cedum, removed with a hot snare. resected and retrieved.  . ESOPHAGOGASTRODUODENOSCOPY (EGD) WITH PROPOFOL N/A 12/21/2015   SLF: Web in the distal esophagus dilated. Erosive gastropathy, biopsied normal duodenal bulb and second portion of the  duodenum.   [name] POLYPECTOMY  12/21/2015   Procedure: POLYPECTOMY;  Surgeon: Danie Binder, MD;  Location: [facility];  Service: Endoscopy;;  at cecum and ascending colon; rectum  . SAVORY DILATION N/A 12/21/2015   Procedure: SAVORY DILATION;  Surgeon: Danie Binder, MD;  Location: [facility];  Service: Endoscopy;  Laterality: N/A;     Social History   reports that he has never smoked. He has never used smokeless tobacco. He reports that he does not drink alcohol or use drugs.   Family History   His family history includes Alzheimer's disease in his mother; Bipolar disorder in his father, sister, and sister; Heart disease in his father; Stroke in his father. There is no history of Colon cancer.   Allergies No Known Allergies   Home Medications  Prior to Admission medications   Medication Sig Start Date End Date Taking? Authorizing Provider  acetaminophen (TYLENOL) 325 MG tablet Take 650 mg by mouth every 6 (six) hours as needed for mild pain, moderate pain or fever.   Yes [provider]  aspirin EC 81 MG tablet Take 81 mg by mouth daily. (0900)   Yes [provider]  atorvastatin (LIPITOR) 20 MG tablet Take 20 mg by mouth daily at 6 PM. (1700)   Yes [provider]  Biotin 5 MG TBDP Take 30 mg by mouth daily. (0900)   Yes [provider]  cholecalciferol (VITAMIN D3) 25 MCG (1000 UT) tablet Take 1,000 Units by mouth daily. (0900)   Yes [provider]  divalproex (DEPAKOTE SPRINKLE) 125 MG capsule Take 500 mg by mouth 3 (three) times daily. (0900, 1300, & 1700)   Yes [provider]  famotidine (PEPCID) 20 MG tablet Take 20 mg by mouth 2 (two) times daily. (0900 & 2000)   Yes [provider]  fluticasone (FLONASE) 50 MCG/ACT nasal spray Place 2 sprays into both nostrils daily. (0900)   Yes [provider]  Insulin Detemir (LEVEMIR FLEXPEN) 100 UNIT/ML Pen Inject 5 Units into the skin daily at 10 pm.    Yes  [provider]  labetalol (NORMODYNE) 100 MG tablet Take 1 tablet (100 mg total) by mouth 2 (two) times daily. Patient taking differently: Take 100 mg by mouth 2 (two) times daily. (0900 & 2100) 03/18/19  Yes Josue Hector, MD  omega-3 acid ethyl esters (LOVAZA) 1 g capsule Take 1 g by mouth 2 (two) times daily. (0900 & 2100)   Yes [provider]  QUEtiapine (SEROQUEL) 300 MG tablet Take 300 mg by mouth 2 (two) times daily. (0900 & 2100)   Yes [provider]  risperiDONE (RISPERDAL) 1 MG tablet Take 1 tablet (1 mg total) by mouth 3 (three) times daily. 06/05/13  Yes Cloria Spring, MD  trihexyphenidyl (ARTANE) 5 MG tablet Take 5 mg by mouth 2 (two) times daily with a meal. (0900 & 2100)   Yes [provider]     Critical care time:      Georgann Housekeeper, AGACNP-BC [place] Pager [phone] or [phone]  05/16/2019 4:55 AM

## 2019-05-16 NOTE — Progress Notes (Signed)
2215 Dr Gilford Raid here to see pt at this time and will write orders for pt.

## 2019-05-16 NOTE — Progress Notes (Signed)
Called patient placement to ask if there was a way to speed up this transfer. They have reported that there is no way. I explained the urgency of his situation.

## 2019-05-16 NOTE — Progress Notes (Addendum)
eLink Physician-Brief Progress Note Patient Name: [name] DOB: 1956/06/12 MRN: [id number]   Date of Service  05/16/2019  HPI/Events of Note  Notified be RN regarding rapid atrial fibrillation with HR in the 150s.   eICU Interventions  Pt has DNR code status, with no escalation of care.  Hold off on amiodarone or ABG.          [name] 05/16/2019, 9:59 PM

## 2019-05-16 NOTE — Significant Event (Signed)
PCCM   Called to bedside to evaluate Initially Pt mental status declined Extremities mottled  Reviewed notes from day shift Pt has been seen by palliative Care I spoke to his HCPOA [name] at 10:15 PM on 9/11 Given his current decline she would like to make him comfort care  She does not want to come and see him, she has thought about this for some time. We had an interactive conversation where her questions were addressed.  10:22PM pt is back to his baseline moving all extremities and verbal  Plan of Care: [place] with no aggressive measures per [facility] RN aware RRT aware  Signed Dr Seward Carol Pulmonary Critical Care Locums

## 2019-05-16 NOTE — Progress Notes (Signed)
EEG Completed; Results Pending  

## 2019-05-16 NOTE — Progress Notes (Signed)
Pt arrived to [facility], placed on telemetry monitor. Patient able to get up to pivot to bed with assist of 2 - pt very unsteady. Bed alarm on. Pt with one IV in left forearm. PT oriented only to self. Orders reviewed and patient oriented to bed controls and unit routines, but will need reinforcement.Pt ordered full liquid diet: I got the patient a glass of ice water and pt drank from straw with no issue; no wet voice, no cough.

## 2019-05-16 NOTE — Significant Event (Signed)
Rapid Response Event Note  Overview: Time Called: 2128 Arrival Time: 2130 Event Type: Neurologic, Respiratory, Cardiac(AMS, respiratory distress, Afib with RVR)  Initial Focused Assessment: Notified by bedside RN that patient was previously agitated, thrashing around bed, kicking is now unresponsive to stimulation. Pt is arousable to noxious stimuli, MAEx4 and equal strength and would only moan. Pt was previously verbal but thrashing in bed. Since my arrival, pt has started thrashing around the bed but still nonverbal. HR 160s afib, 111/69, RR 40 with sats 97% on 3L [place]. Skin is pale, mottled and dry. BBS Rhonchi and diminished in the bases. TRH contacted and we were directed to PCCM until [facility] takes over care in am. Awaiting further orders.  Dr. Gilford Raid called to clarify [place] and no escalation in care.  2210- pt is now closer to neurologic baseline prior to the event. He is opening his eyes and moving all over the bed, kicking legs over side rail as before.   Interventions: -Stat 12 Lead EKG   Plan of Care (if not transferred): -Notify primary svc of events and further orders  Event Summary: Name of Physician Notified: Dr. James Ivanoff PCCM at 2150    at    Outcome: Stayed in room and stabalized     Madelynn Done

## 2019-05-16 NOTE — Progress Notes (Signed)
1938 Pt very agitated, pulling at IV, catheters, and trying to get out of bed, paged provider for update and prn medication, awaiting response.

## 2019-05-16 NOTE — Progress Notes (Addendum)
2138 RRT was called and arrived at this time  to evaluate pt d/t pt being very agitated, with arms flailing and trying to get out of bed, and then pt abruptly stopped all movement. Provider had been paged with update on pt condition and awaiting response.  E-Link responded Via Dr. James Ivanoff and she stated the patient was a no escalation patient and no invasive interventions were needed at this time. The patient was in A-fib with RVR with HR in 150-160s, respiratory distress, BP was 96/85, patient was gasping for air with RR 40s, and the patient was pale and lower extremities were mottled bilaterally. Patient did not respond to sternal rub or other painful stimuli.   Dr Gilford Raid called and stated that the patient does this and it is his baseline. She restated that the patient is a non escalation patient and she came to the bedside. Upon her arrival, the patient was back at baseline and speaking in sentences with her, HR was 140s and blood pressure improved to 102/85. Dr. Gilford Raid stated no further intervention was needed at this time.  The patient is now restless and agitated. Rodman Pickle RN is at bedside with the patient and will continue to monitor his condition.

## 2019-05-16 NOTE — Progress Notes (Signed)
eLink Physician-Brief Progress Note Patient Name: Derrick Butler DOB: Jan 04, 1956 MRN: [id number]   Date of Service  05/16/2019  HPI/Events of Note  Hypotension  eICU Interventions  Albumin 5 % 250 ml iv x 1        [name] 05/16/2019, 6:49 AM

## 2019-05-16 NOTE — Evaluation (Signed)
Clinical/Bedside Swallow Evaluation Patient Details  Name: Derrick Butler MRN: [id number] Date of Birth: [birth year]  Today's Date: 05/16/2019 Time: SLP Start Time (ACUTE ONLY): [id number] SLP Stop Time (ACUTE ONLY): 1528 SLP Time Calculation (min) (ACUTE ONLY): 11 min  Past Medical History:  Past Medical History:  Diagnosis Date  . Agitation    and aggression  . Altered mental status    with psychosis  . Anemia   . Aortic dissection ([place])   . Aortic dissection ([place])   . Bipolar 1 disorder ([place])   . Cardiomyopathy   . Cardiomyopathy ([place])   . CKD (chronic kidney disease)   . Coronary artery disease   . CVA (cerebral infarction)   . CVA (cerebral infarction)   . Dehydration   . Diabetes mellitus   . Dysphagia   . Encephalopathy   . Hyperglycemia   . Hyperkalemia   . Hyperlipemia   . Hypertension   . Hypertension   . Hyponatremia   . Lack of coordination   . Poor historian   . Psychosis ([place])   . Psychosis ([place])   . Thrombocytopenia ([place])   . TIA (transient ischemic attack)    Past Surgical History:  Past Surgical History:  Procedure Laterality Date  . ASCENDING AORTIC ANEURYSM REPAIR    . BIOPSY  12/21/2015   Procedure: BIOPSY;  Surgeon: Danie Binder, MD;  [facility];  Service: Endoscopy;;  gastric biopsy  . BIOPSY  05/13/2019   Procedure: BIOPSY;  Surgeon: Danie Binder, MD;  [facility];  Service: Endoscopy;;  rectum  . COLONOSCOPY  05/13/2019   Dr. Oneida Alar: Incomplete due to poor bowel prep.  2 polyps removed via cold snare, one from the hepatic flexure and one from the proximal ascending colon.  Red blood noted in the rectum, status post biopsy.  External hemorrhoids.  Repeat colonoscopy in 3 months due to poor prep.  Biopsy showed tubular adenomas, rectal biopsy consistent with ulcer  . COLONOSCOPY WITH PROPOFOL N/A 12/21/2015   SLF: one 58mm polyp in the ascending colon , removed with a cold biopsy forceps. resectedand retrieved.  four 6- 8  mm polyps in the rectum , in the ascending colon and in the cedum, removed with a hot snare. resected and retrieved.  . COLONOSCOPY WITH PROPOFOL N/A 05/13/2019   Procedure: COLONOSCOPY WITH PROPOFOL;  Surgeon: Danie Binder, MD;  [facility];  Service: Endoscopy;  Laterality: N/A;  10:30am  . ESOPHAGOGASTRODUODENOSCOPY (EGD) WITH PROPOFOL N/A 12/21/2015   SLF: Web in the distal esophagus dilated. Erosive gastropathy, biopsied normal duodenal bulb and second portion of the duodenum.   [name] FLEXIBLE SIGMOIDOSCOPY N/A 05/15/2019   Procedure: FLEXIBLE SIGMOIDOSCOPY;  Surgeon: Danie Binder, MD;  [facility];  Service: Endoscopy;  Laterality: N/A;  . POLYPECTOMY  12/21/2015   Procedure: POLYPECTOMY;  Surgeon: Danie Binder, MD;  [facility];  Service: Endoscopy;;  at cecum and ascending colon; rectum  . POLYPECTOMY  05/13/2019   Procedure: POLYPECTOMY;  Surgeon: Danie Binder, MD;  [facility];  Service: Endoscopy;;  colon  . SAVORY DILATION N/A 12/21/2015   Procedure: SAVORY DILATION;  Surgeon: Danie Binder, MD;  [facility];  Service: Endoscopy;  Laterality: N/A;   HPI:  Pt is a 63 yo male presenting from SNF with GI bleed and UTI, found to have an enlarged ascending aortic aneurysm, concerning for graft leak. There is some concern  for aspiration PNA per chart but CXR report suggests edema. Esophagram in 2017 showed laryngeal penetration without aspiration as well as mild esophageal dysmotility. PMH: TIA, HTN, HLD, dysphagia, DM, dehydration, CVA, CAD, CKD, bipolar, psychosis   Assessment / Plan / Recommendation Clinical Impression  Pt is alert this afternoon but also confused and not able to provide history. Per chart review he has a h/o esophageal dysmotility with that same esophagram showing penetration but no aspiration. Today he needs encouragement to take POs, but he has no overt signs of aspiration. His swallow appears to be grossly  functional, although he was only tested with full liquid consistencies. He seems appropriate to continue on this diet. When he is medically ready to advance, would consider softer solids (likely no more than mechanical soft) given condition of dentition, mentation, and h/o esophageal issues. SLP will follow briefly for tolerance as solids are advanced.   SLP Visit Diagnosis: Dysphagia, unspecified (R13.10)    Aspiration Risk  Mild aspiration risk    Diet Recommendation Thin liquid   Liquid Administration via: Cup;Straw Medication Administration: Whole meds with puree Supervision: Staff to assist with self feeding Compensations: Slow rate;Small sips/bites Postural Changes: Seated upright at 90 degrees;Remain upright for at least 30 minutes after po intake    Other  Recommendations Oral Care Recommendations: Oral care BID   Follow up Recommendations 24 hour supervision/assistance      Frequency and Duration min 2x/week  1 week       Prognosis Prognosis for Safe Diet Advancement: Good Barriers to Reach Goals: Cognitive deficits      Swallow Study   General HPI: Pt is a 63 yo male presenting from SNF with GI bleed and UTI, found to have an enlarged ascending aortic aneurysm, concerning for graft leak. There is some concern for aspiration PNA per chart but CXR report suggests edema. Esophagram in 2017 showed laryngeal penetration without aspiration as well as mild esophageal dysmotility. PMH: TIA, HTN, HLD, dysphagia, DM, dehydration, CVA, CAD, CKD, bipolar, psychosis Type of Study: Bedside Swallow Evaluation Previous Swallow Assessment: none in chart Diet Prior to this Study: Thin liquids;Other (Comment)(full liquid) Temperature Spikes Noted: No Respiratory Status: Nasal cannula History of Recent Intubation: No Behavior/Cognition: Alert;Cooperative;Pleasant mood;Confused;Requires cueing Oral Cavity Assessment: Within Functional Limits Oral Care Completed by SLP: No Oral Cavity -  Dentition: Missing dentition;Poor condition Vision: Functional for self-feeding Self-Feeding Abilities: Needs assist Patient Positioning: Upright in bed Baseline Vocal Quality: Normal Volitional Swallow: Unable to elicit    Oral/Motor/Sensory Function Overall Oral Motor/Sensory Function: (difficulty following commands but appears symmetrical)   Ice Chips Ice chips: Not tested   Thin Liquid Thin Liquid: Within functional limits Presentation: Cup;Self Fed;Straw    Nectar Thick Nectar Thick Liquid: Not tested   Honey Thick Honey Thick Liquid: Not tested   Puree Puree: Within functional limits Presentation: Spoon   Solid     Solid: Not tested      Venita Sheffield Shaasia Odle 05/16/2019,3:31 PM  Pollyann Glen, M.A. [place] Acute Environmental education officer [phone] Office [phone]

## 2019-05-17 LAB — URINE CULTURE: Culture: >=100000 — AB

## 2019-05-17 LAB — GLUCOSE, CAPILLARY
Glucose-Capillary: 162 mg/dL — ABNORMAL HIGH (ref 70–99)
Glucose-Capillary: 157 mg/dL — ABNORMAL HIGH (ref 70–99)
Glucose-Capillary: 95 mg/dL (ref 70–99)

## 2019-05-17 LAB — CBC
HCT: 27.1 % — ABNORMAL LOW (ref 39.0–52.0)
Hemoglobin: 9 g/dL — ABNORMAL LOW (ref 13.0–17.0)
MCH: 32.3 pg (ref 26.0–34.0)
MCHC: 33.2 g/dL (ref 30.0–36.0)
MCV: 97.1 fL (ref 80.0–100.0)
Platelets: 39 10*3/uL — ABNORMAL LOW (ref 150–400)
RBC: 2.79 MIL/uL — ABNORMAL LOW (ref 4.22–5.81)
RDW: 12.3 % (ref 11.5–15.5)
WBC: 4.6 10*3/uL (ref 4.0–10.5)
nRBC: 0 % (ref 0.0–0.2)

## 2019-05-17 LAB — PROCALCITONIN: Procalcitonin: 2.06 ng/mL

## 2019-05-17 LAB — HEPARIN LEVEL (UNFRACTIONATED): Heparin Unfractionated: <0.1 IU/mL — ABNORMAL LOW (ref 0.30–0.70)

## 2019-05-17 MED ORDER — LORAZEPAM 2 MG/ML IJ SOLN: 0.5000 mg | INTRAMUSCULAR | Status: DC | PRN

## 2019-05-17 MED ORDER — POLYVINYL ALCOHOL 1.4 % OP SOLN
1.0000 [drp] | Freq: Four times a day (QID) | OPHTHALMIC | Status: DC | PRN
  Filled 2019-05-17: qty 15

## 2019-05-17 MED ORDER — MORPHINE SULFATE 10 MG/5ML PO SOLN
5.0000 mg | ORAL | Status: DC
  Administered 2019-05-17: 12:00:00 5 mg via ORAL
  Filled 2019-05-17 (×2): qty 4

## 2019-05-17 MED ORDER — GLYCOPYRROLATE 1 MG PO TABS
1.0000 mg | ORAL_TABLET | ORAL | Status: DC | PRN
  Filled 2019-05-17: qty 1

## 2019-05-17 MED ORDER — LORAZEPAM 2 MG/ML IJ SOLN
2.0000 mg | INTRAMUSCULAR | Status: DC
  Administered 2019-05-17 – 2019-05-19 (×13): 2 mg via INTRAVENOUS
  Filled 2019-05-17 (×13): qty 1

## 2019-05-17 MED ORDER — FUROSEMIDE 10 MG/ML IJ SOLN
20.0000 mg | Freq: Once | INTRAMUSCULAR | Status: AC
  Administered 2019-05-17: 20 mg via INTRAVENOUS
  Filled 2019-05-17: qty 2

## 2019-05-17 MED ORDER — MORPHINE SULFATE (PF) 2 MG/ML IV SOLN
2.0000 mg | INTRAVENOUS | Status: DC | PRN
  Administered 2019-05-17 – 2019-05-19 (×6): 2 mg via INTRAVENOUS
  Filled 2019-05-17 (×6): qty 1

## 2019-05-17 MED ORDER — QUETIAPINE FUMARATE 25 MG PO TABS
200.0000 mg | ORAL_TABLET | Freq: Once | ORAL | Status: AC
  Administered 2019-05-17: 12:00:00 200 mg via ORAL
  Filled 2019-05-17: qty 8

## 2019-05-17 MED ORDER — HALOPERIDOL LACTATE 5 MG/ML IJ SOLN
0.5000 mg | INTRAMUSCULAR | Status: DC | PRN
  Administered 2019-05-17 – 2019-05-19 (×4): 0.5 mg via INTRAVENOUS
  Filled 2019-05-17 (×4): qty 1

## 2019-05-17 MED ORDER — BIOTENE DRY MOUTH MT LIQD: 15.0000 mL | OROMUCOSAL | Status: DC | PRN

## 2019-05-17 MED ORDER — HALOPERIDOL LACTATE 2 MG/ML PO CONC
0.5000 mg | ORAL | Status: DC | PRN
  Filled 2019-05-17: qty 0.3

## 2019-05-17 MED ORDER — GLYCOPYRROLATE 0.2 MG/ML IJ SOLN: 0.2000 mg | INTRAMUSCULAR | Status: DC | PRN

## 2019-05-17 MED ORDER — QUETIAPINE FUMARATE 300 MG PO TABS
300.0000 mg | ORAL_TABLET | Freq: Two times a day (BID) | ORAL | Status: DC
  Filled 2019-05-17 (×5): qty 1

## 2019-05-17 MED ORDER — HALOPERIDOL 0.5 MG PO TABS
0.5000 mg | ORAL_TABLET | ORAL | Status: DC | PRN
  Filled 2019-05-17: qty 1

## 2019-05-17 MED ORDER — RISPERIDONE 1 MG PO TBDP
1.0000 mg | ORAL_TABLET | Freq: Three times a day (TID) | ORAL | Status: DC
  Administered 2019-05-17: 1 mg via ORAL
  Filled 2019-05-17 (×9): qty 1

## 2019-05-17 MED ORDER — HALOPERIDOL LACTATE 5 MG/ML IJ SOLN: 2.0000 mg | Freq: Four times a day (QID) | INTRAMUSCULAR | Status: DC | PRN

## 2019-05-17 NOTE — Progress Notes (Addendum)
Patient had large bloody bowel movement, agitated, and having labored breathing. Starla Link, MD notified and at bedside. Orders placed, will continue to monitor.

## 2019-05-17 NOTE — Progress Notes (Signed)
ANTICOAGULATION CONSULT NOTE - Initial Consult  Pharmacy Consult for heparin Indication: chest pain/ACS and atrial fibrillation  No Known Allergies  Patient Measurements: Weight: 168 lb 10.4 oz (76.5 kg)   Vital Signs: Temp: 98.8 F (37.1 C) (09/12 0547) Temp Source: Oral (09/12 0547) BP: 115/98 (09/12 0547) Pulse Rate: 86 (09/12 0547)  Labs: Recent Labs    05/14/19 1756 05/14/19 1801  05/15/19 0443  05/15/19 1526 05/15/19 2058 05/15/19 2300 05/16/19 0347 05/17/19 0315  HGB 10.8* 10.5*   < > 9.2*   < > 9.9*  9.8*  --   --  9.6* 9.0*  HCT 35.3* 31.0*   < > 28.0*   < > 30.8*  30.9*  --   --  29.5* 27.1*  PLT 72*  --   --  43*  --  49*  --   --  44* 39*  APTT 30  --   --   --   --   --   --   --   --   --   LABPROT 17.6*  --   --   --   --   --   --   --   --   --   INR 1.5*  --   --   --   --   --   --   --   --   --   HEPARINUNFRC  --   --   --   --   --   --   --   --   --  <0.10*  CREATININE 2.78* 2.60*  --  2.22*  --   --   --   --  3.07*  --   TROPONINIHS  --   --   --   --   --   --  1,792* 1,884*  --   --    < > = values in this interval not displayed.    Estimated Creatinine Clearance: 22.5 mL/min (A) (by C-G formula based on SCr of 3.07 mg/dL (H)).   Medical History: Past Medical History:  Diagnosis Date  . Agitation    and aggression  . Altered mental status    with psychosis  . Anemia   . Aortic dissection ([place])   . Aortic dissection ([place])   . Bipolar 1 disorder ([place])   . Cardiomyopathy   . Cardiomyopathy ([place])   . CKD (chronic kidney disease)   . Coronary artery disease   . CVA (cerebral infarction)   . CVA (cerebral infarction)   . Dehydration   . Diabetes mellitus   . Dysphagia   . Encephalopathy   . Hyperglycemia   . Hyperkalemia   . Hyperlipemia   . Hypertension   . Hypertension   . Hyponatremia   . Lack of coordination   . Poor historian   . Psychosis ([place])   . Psychosis ([place])   . Thrombocytopenia ([place])   . TIA (transient  ischemic attack)     Medications:  Medications Prior to Admission  Medication Sig Dispense Refill Last Dose  . acetaminophen (TYLENOL) 325 MG tablet Take 650 mg by mouth every 6 (six) hours as needed for mild pain, moderate pain or fever.   unknown  . aspirin EC 81 MG tablet Take 81 mg by mouth daily. (0900)   05/12/2019  . atorvastatin (LIPITOR) 20 MG tablet Take 20 mg by mouth daily at 6 PM. (1700)   05/14/2019  . Biotin 5 MG TBDP  Take 30 mg by mouth daily. (0900)   05/14/2019  . cholecalciferol (VITAMIN D3) 25 MCG (1000 UT) tablet Take 1,000 Units by mouth daily. (0900)   05/14/2019  . divalproex (DEPAKOTE SPRINKLE) 125 MG capsule Take 500 mg by mouth 3 (three) times daily. (0900, 1300, & 1700)   05/14/2019  . famotidine (PEPCID) 20 MG tablet Take 20 mg by mouth 2 (two) times daily. (0900 & 2000)   05/14/2019  . fluticasone (FLONASE) 50 MCG/ACT nasal spray Place 2 sprays into both nostrils daily. (0900)   05/14/2019  . Insulin Detemir (LEVEMIR FLEXPEN) 100 UNIT/ML Pen Inject 5 Units into the skin daily at 10 pm.    05/13/2019 at 2200  . labetalol (NORMODYNE) 100 MG tablet Take 1 tablet (100 mg total) by mouth 2 (two) times daily. (Patient taking differently: Take 100 mg by mouth 2 (two) times daily. (0900 & 2100)) 180 tablet 3 05/14/2019 at 0900  . omega-3 acid ethyl esters (LOVAZA) 1 g capsule Take 1 g by mouth 2 (two) times daily. (0900 & 2100)   05/14/2019  . QUEtiapine (SEROQUEL) 300 MG tablet Take 300 mg by mouth 2 (two) times daily. (0900 & 2100)   05/14/2019  . risperiDONE (RISPERDAL) 1 MG tablet Take 1 tablet (1 mg total) by mouth 3 (three) times daily. 90 tablet 2 05/14/2019  . trihexyphenidyl (ARTANE) 5 MG tablet Take 5 mg by mouth 2 (two) times daily with a meal. (0900 & 2100)   05/14/2019    Assessment: Pharmacy consulted to dose heparin in patient with ACS/STEMI and afib.  Patient's troponin 1,884. He is not on anticoagulation prior to initiation. Heparin is on hold due to GIB, plts 39, hbg 9.0/hct 27.1  (trending down). Heparin level <0.10.   Goal of Therapy:  Heparin level 0.3-0.7 units/ml Monitor platelets by anticoagulation protocol: Yes   Plan:  Follow-up heparin hold Continue to monitor H&h and platelets  Acey Lav, PharmD  PGY1 [place] Resident [phone] 05/17/2019,7:42 AM

## 2019-05-17 NOTE — Progress Notes (Addendum)
Daily Progress Note   Patient Name: Derrick Butler       Date: 05/17/2019 DOB: 01-21-1956  Age: 63 y.o. MRN#: [id number] Attending Physician: Aline August, MD Primary Care Physician: Hilbert Corrigan, MD Admit Date: 05/14/2019  Reason for Consultation/Follow-up: Establishing goals of care  Subjective: Patient in bed, opens eyes briefly with unintelligible speech. Patient with declining status overnight. Episodes of SOB. Noted PCT trending up to 2.06. He also had a large bloody bowel movement. He continues to be agitated, not eating or drinking. Cr trending up.  Discussed with his [place].  She requests no further aggressive measures including labs, IV fluids, antibiotics. Her preference is for comfort measures only. She requests that patient be transferred to residential [facility] in [place].  She notes that patient has declined significantly over the last year and even more so in the last few months, being bedbound, losing ability to communicate. He would not prefer to continue this quality of life.  Review of Systems  Unable to perform ROS: Acuity of condition    Length of Stay: 3  Current Medications: Scheduled Meds:  . Chlorhexidine Gluconate Cloth  6 each Topical Q0600  . divalproex  500 mg Oral TID  . furosemide  20 mg Intravenous Once  . mupirocin ointment  1 application Nasal BID  . pantoprazole (PROTONIX) IV  40 mg Intravenous Q24H  . QUEtiapine  200 mg Oral Once  . QUEtiapine  300 mg Oral BID  . risperiDONE  1 mg Oral TID  . trihexyphenidyl  5 mg Oral BID WC    Continuous Infusions:   PRN Meds: acetaminophen **OR** acetaminophen, LORazepam, morphine injection, ondansetron **OR** ondansetron (ZOFRAN) IV  Physical Exam Constitutional:    Appearance: He is ill-appearing.  HENT:     Head: Normocephalic and atraumatic.  Cardiovascular:     Pulses: Normal pulses.  Pulmonary:     Effort: Pulmonary effort is normal.             Vital Signs: BP (!) 115/98 (BP Location: Right Arm)   Pulse 86   Temp 98.8 F (37.1 C) (Oral)   Resp (!) 25   Wt 76.5 kg   SpO2 97%   BMI 27.22 kg/m  SpO2: SpO2: 97 % O2 Device: O2 Device: Room Air O2 Flow Rate: O2  Flow Rate (L/min): 3 L/min  Intake/output summary:   Intake/Output Summary (Last 24 hours) at 05/17/2019 1140 Last data filed at 05/17/2019 0000 Gross per 24 hour  Intake -  Output 1050 ml  Net -1050 ml   LBM: Last BM Date: 05/14/19 Baseline Weight: Weight: 77 kg Most recent weight: Weight: 76.5 kg       Palliative Assessment/Data: PPS: 10%     Patient Active Problem List   Diagnosis Date Noted  . Advanced care planning/counseling discussion   . Goals of care, counseling/discussion   . Acute respiratory failure with hypoxia ([place]) 05/15/2019  . Acute renal failure superimposed on stage 3 chronic kidney disease ([place]) 05/15/2019  . Acute blood loss anemia 05/15/2019  . Aspiration pneumonitis ([place]) 05/15/2019  . Lower GI bleed 05/15/2019  . GI bleed 05/14/2019  . Adenomatous colon polyp 01/26/2016  . AKI (acute kidney injury) ([place]) 01/12/2016  . DKA (diabetic ketoacidoses) ([place]) 01/09/2016  . Dysphagia 11/23/2015  . Bacteremia 11/09/2014  . Hyperkalemia 09/25/2014  . Hypoglycemia 09/25/2014  . HCAP (healthcare-associated pneumonia) 07/15/2014  . Acute encephalopathy 07/15/2014  . Acute sepsis ([place]) 07/15/2014  . Thrombocytopenia ([place]) 07/14/2014  . CAP (community acquired pneumonia) 07/13/2014  . Protein-calorie malnutrition, severe ([place]) 09/09/2013  . CKD (chronic kidney disease) stage 3, GFR 30-59 ml/min ([facility]) 09/09/2013  . CVA, old, cognitive deficits 09/06/2013  . UTI (lower urinary tract infection) 09/06/2013  . Dehydration 06/19/2013  . H/O: stroke  06/18/2013  . Altered mental status 06/18/2013  . Bipolar I disorder, most recent episode mixed ([place]) 06/02/2013  . CARDIOMYOPATHY OTHER DISEASES CLASSIFIED ELSW 02/21/2010  . Essential hypertension 03/12/2009  . AORTIC DISSECTION 03/12/2009    Palliative Care Assessment & Plan   Patient Profile: 63 y.o. male  with past medical history of aortic aneurysm dissection s/p graft, CVA, dementia- residing at [facility], CKD  admitted on 05/14/2019 with GIB, UTI and possible aspiration PNA. Further workup during admission found widening aneurysm with possible leak. During admission he has been hypotensive, but this has stabilized. Per discussion with his HCPOA- they have decided not to pursue further treatment or diagnostics for his aortic aneurysm. He continues to be treated for PNA and UTI, had an SLP eval today showed no overt signs of aspiration on full liquid, however, he remains very disoriented and confused and is taking in only limited amounts of PO. His Cr is trending up.   Assessment/Recommendations/Plan   D/C all interventions not provided for comfort  Transition care to comfort only  Schedule morphine concentrate 5mg  q4hr   Morphine concentrate 5mg  q2hr prn for agitation or signs of discomfort  Schedule lorazepam 2mg  q4hr for agitation  Social work referral for placement at [facility] and Additional Recommendations:  Limitations on Scope of Treatment: Full Comfort Care  Code Status:  DNR  Prognosis:   < 2 weeks due to sepsis likely from aspiration pnuemonia, UTI, in the setting of GI bleeding s/p colonoscopy, leaking aortic aneurysm graft, encephalopathy, not eating or drinking, acute renal failur  Discharge Planning:  Hospice facility  Care plan was discussed with patient's [place]  Thank you for allowing the Palliative Medicine Team to assist in the care of this patient.   Time In: 1100 Time Out: 1145 Total Time 45 mins Prolonged  Time Billed no      Greater than 50%  of this time was spent counseling and coordinating care related to the above assessment and plan.  Leonard Downing  [facility]   Please contact Palliative Medicine Team phone at [phone] for questions and concerns.

## 2019-05-17 NOTE — Progress Notes (Signed)
Patient ID: Derrick Butler, male   DOB: [birth year], 64 y.o.   MRN: [id number]  PROGRESS NOTE    Derrick Butler  [id number] DOB: 15-Apr-1956 DOA: 05/14/2019 PCP: Hilbert Corrigan, MD   Brief Narrative:  63 year old male with history of stroke, thrombocytopenia, hypertension, hyperlipidemia, diabetes mellitus type 2, coronary artery disease, bipolar disorder, cognitive impairment presented on 05/14/2019 to [facility] from SNF secondary to hematochezia, hypoxia and hypotension.  He had undergone colonoscopy by Dr. Oneida Alar on 05/13/2019: Noted to have poor prep, 2 polyps were removed.  He was discharged back to the nursing home subsequently.  On 05/14/2019, he presented with hematochezia and hypotension with hypoxia requiring supplemental oxygen and elevated creatinine.  Chest x-ray showed vascular congestion.  He was started on broad-spectrum antibiotics and given intravenous Lasix.  He underwent flexible sigmoidoscopy on 05/15/2019 by Dr. Oneida Alar which showed rectal lesion/tear.  Subsequently, he became even more hypotensive, hypoxic and tachycardic and was transferred to ICU.  He was transferred to [facility] under PCCM care.  CTA to rule out PE showed enlarged ascending aortic aneurysm with sac measuring 7.0 into 6.6 cm with concern for graft leak.  After discussion with legal guardian, it was decided that patient would not be a good candidate for surgical intervention and to focus on his comfort.  Palliative care was consulted.  Patient was transferred back to [facility] care on 05/17/2019.  Assessment & Plan:   Ascending aortic aneurysm enlargement with possibility of graft leak on CT of chest Probable lower GI bleeding causing recurrent hematochezia E. coli UTI Possible aspiration pneumonia Acute systolic heart failure with pulmonary edema Dementia with agitated delirium/bipolar disorder/cognitive impairment Acute kidney injury on chronic kidney disease stage  III Thrombocytopenia Essential hypertension Hyperlipidemia  Plan -Patient is critically ill and continues to have intermittent hematochezia.  He was extremely agitated overnight and looks very short of breath. -After discussion with palliative care team and legal guardian, it was decided that patient would not be a surgical candidate and to focus on his comfort.  He has been transferred out of ICU. -I will not repeat any further imaging or labs. -We will add morphine for air hunger.  Give 20 mg IV Lasix x1 this might help with his breathing. -Increase Seroquel and risperidone his home doses.  Continue trihexyphenidyl.  He will Ativan and Haldol as needed for agitation.  If these do not work, might have to use restraints -I recommend starting total comfort measures for this patient.  Palliative care following.  I have relayed this information to palliative care team as well. -Currently on Zosyn: We will DC antibiotics   DVT prophylaxis: DC SCDs and focus on comfort Code Status: DNR Family Communication: None at bedside Disposition Plan: Depends on clinical outcome  Consultants:   Procedures:  Echo on 05/15/2019 IMPRESSIONS    1. The left ventricle has moderate-severely reduced systolic function, with an ejection fraction of 30-35%. The cavity size was normal. Left ventricular diastolic Doppler parameters are indeterminate.  2. Diffuse LV hypokinesis, the apex is akinetic.  3. The right ventricle has normal systolic function. The cavity was normal. There is no increase in right ventricular wall thickness.  4. Left atrial size was moderately dilated.  5. No evidence of mitral valve stenosis.  6. The aortic valve was not well visualized. Moderate thickening of the aortic valve. Moderate calcification of the aortic valve. Aortic valve regurgitation poorly visualized. Moderate aortic annular calcification noted.  7. AV poorly visualized with  limited Doppler images. Grossly mild to  moderate AI. From limited visualization and very limited Doppler data no clear evidence of significant aortic stenosis.  8. The aorta is abnormal unless otherwise noted.  9. There is moderate dilatation of the aortic root measuring 45 mm. 10. Pulmonary hypertension is indeterminant, inadequate TR jet. 11. The interatrial septum was not well visualized.  FINDINGS  Left Ventricle: The left ventricle has moderate-severely reduced systolic function, with an ejection fraction of 30-35%. The cavity size was normal. There is no increase in left ventricular wall thickness. Left ventricular diastolic Doppler parameters  are indeterminate. Diffuse LV hypokinesis, the apex is akinetic.  Antimicrobials:  Anti-infectives (From admission, onward)   Start     Dose/Rate Route Frequency Ordered Stop   05/15/19 0900  piperacillin-tazobactam (ZOSYN) IVPB 3.375 g     3.375 g 12.5 mL/hr over 240 Minutes Intravenous Every 8 hours 05/15/19 0857     05/14/19 1900  piperacillin-tazobactam (ZOSYN) IVPB 3.375 g  Status:  Discontinued     3.375 g 100 mL/hr over 30 Minutes Intravenous  Once 05/14/19 1851 05/15/19 0857   05/14/19 1900  vancomycin (VANCOCIN) IVPB 1000 mg/200 mL premix     1,000 mg 200 mL/hr over 60 Minutes Intravenous  Once 05/14/19 1851 05/14/19 2122       Subjective: Patient seen and examined at bedside.  Nursing staff reports intermittent hematochezia and intermittent worsening agitation overnight.  Patient is awake but very poor historian.  Objective: Vitals:   05/16/19 1800 05/16/19 1829 05/16/19 2133 05/17/19 0547  BP:  119/75 96/85 (!) 115/98  Pulse: 75 90 89 86  Resp: (!) 24 17  (!) 25  Temp:  97.7 F (36.5 C)  98.8 F (37.1 C)  TempSrc:    Oral  SpO2: 97% 97% (!) 79% 97%  Weight:        Intake/Output Summary (Last 24 hours) at 05/17/2019 1030 Last data filed at 05/17/2019 0000 Gross per 24 hour  Intake --  Output 1050 ml  Net -1050 ml   Filed Weights   05/14/19 2237  05/16/19 0500  Weight: 77 kg 76.5 kg    Examination:  General exam: Awake but confused and poor historian. Respiratory system: Bilateral decreased breath sounds at bases with scattered crackles Cardiovascular system: S1 & S2 heard, Rate controlled Gastrointestinal system: Abdomen is nondistended, soft and nontender. Normal bowel sounds heard. Extremities: No cyanosis, clubbing; trace edema  Data Reviewed: I have personally reviewed following labs and imaging studies  CBC: Recent Labs  Lab 05/14/19 1756  05/15/19 0443 05/15/19 0850 05/15/19 1526 05/16/19 0347 05/17/19 0315  WBC 9.2  --  4.7  --  5.2 5.5 4.6  NEUTROABS 3.9  --   --   --  3.3 3.4  --   HGB 10.8*   < > 9.2* 10.0* 9.9*   9.8* 9.6* 9.0*  HCT 35.3*   < > 28.0* 30.6* 30.8*   30.9* 29.5* 27.1*  MCV 104.1*  --  97.2  --  99.4 97.0 97.1  PLT 72*  --  43*  --  49* 44* 39*   < > = values in this interval not displayed.   Basic Metabolic Panel: Recent Labs  Lab 05/13/19 1033 05/14/19 1756 05/14/19 1801 05/15/19 0443 05/16/19 0347  NA 143 138 139 138 138  K 5.2* 5.1 5.2* 4.5 5.0  CL 107 103 106 112* 107  CO2 23 16*  --  19* 21*  GLUCOSE 73 193* 177* 132* 131*  BUN 46* 44* 40* 43* 44*  CREATININE 2.35* 2.78* 2.60* 2.22* 3.07*  CALCIUM 8.7* 8.4*  --  7.4* 8.1*  MG  --   --   --   --  2.0  PHOS  --   --   --   --  4.4   GFR: Estimated Creatinine Clearance: 22.5 mL/min (A) (by C-G formula based on SCr of 3.07 mg/dL (H)). Liver Function Tests: Recent Labs  Lab 05/14/19 1756 05/15/19 0443 05/16/19 0347  AST 37 28 48*  ALT 14 14 19   ALKPHOS 50 41 54  BILITOT 0.8 0.7 0.7  PROT 5.3* 4.4* 4.9*  ALBUMIN 3.0* 2.5* 2.7*   No results for input(s): LIPASE, AMYLASE in the last 168 hours. No results for input(s): AMMONIA in the last 168 hours. Coagulation Profile: Recent Labs  Lab 05/14/19 1756  INR 1.5*   Cardiac Enzymes: No results for input(s): CKTOTAL, CKMB, CKMBINDEX, TROPONINI in the last 168  hours. BNP (last 3 results) No results for input(s): PROBNP in the last 8760 hours. HbA1C: Recent Labs    05/14/19 2104  HGBA1C 6.0*   CBG: Recent Labs  Lab 05/16/19 0822 05/16/19 1157 05/16/19 1611 05/17/19 0745 05/17/19 0902  GLUCAP 115* 110* 138* 162* 157*   Lipid Profile: No results for input(s): CHOL, HDL, LDLCALC, TRIG, CHOLHDL, LDLDIRECT in the last 72 hours. Thyroid Function Tests: No results for input(s): TSH, T4TOTAL, FREET4, T3FREE, THYROIDAB in the last 72 hours. Anemia Panel: No results for input(s): VITAMINB12, FOLATE, FERRITIN, TIBC, IRON, RETICCTPCT in the last 72 hours. Sepsis Labs: Recent Labs  Lab 05/14/19 1756 05/14/19 2104 05/15/19 0443 05/15/19 2058 05/15/19 2300 05/17/19 0315  PROCALCITON  --   --  0.45  --   --  2.06  LATICACIDVEN >11.0* 3.3*  --  6.6* 3.8*  --     Recent Results (from the past 240 hour(s))  SARS Coronavirus 2 [facility] order, Performed in [facility] hospital lab) Nasopharyngeal Nasopharyngeal Swab     Status: None   Collection Time: 05/13/19  8:00 AM   Specimen: Nasopharyngeal Swab  Result Value Ref Range Status   SARS Coronavirus 2 NEGATIVE NEGATIVE Final    Comment: (NOTE) If result is NEGATIVE SARS-CoV-2 target nucleic acids are NOT DETECTED. The SARS-CoV-2 RNA is generally detectable in upper and lower  respiratory specimens during the acute phase of infection. The lowest  concentration of SARS-CoV-2 viral copies this assay can detect is 250  copies / mL. A negative result does not preclude SARS-CoV-2 infection  and should not be used as the sole basis for treatment or other  patient management decisions.  A negative result may occur with  improper specimen collection / handling, submission of specimen other  than nasopharyngeal swab, presence of viral mutation(s) within the  areas targeted by this assay, and inadequate number of viral copies  (<250 copies / mL). A negative result must be combined with clinical   observations, patient history, and epidemiological information. If result is POSITIVE SARS-CoV-2 target nucleic acids are DETECTED. The SARS-CoV-2 RNA is generally detectable in upper and lower  respiratory specimens dur ing the acute phase of infection.  Positive  results are indicative of active infection with SARS-CoV-2.  Clinical  correlation with patient history and other diagnostic information is  necessary to determine patient infection status.  Positive results do  not rule out bacterial infection or co-infection with other viruses. If result is PRESUMPTIVE POSTIVE SARS-CoV-2 nucleic acids MAY BE PRESENT.   A  presumptive positive result was obtained on the submitted specimen  and confirmed on repeat testing.  While 2019 novel coronavirus  (SARS-CoV-2) nucleic acids may be present in the submitted sample  additional confirmatory testing may be necessary for epidemiological  and / or clinical management purposes  to differentiate between  SARS-CoV-2 and other Sarbecovirus currently known to infect humans.  If clinically indicated additional testing with an alternate test  methodology [phone]) is advised. The SARS-CoV-2 RNA is generally  detectable in upper and lower respiratory sp ecimens during the acute  phase of infection. The expected result is Negative. Fact Sheet for Patients:  StrictlyIdeas.no Fact Sheet for Healthcare Providers: BankingDealers.co.za This test is not yet approved or cleared by the [place] FDA and has been authorized for detection and/or diagnosis of SARS-CoV-2 by FDA under an Emergency Use Authorization (EUA).  This EUA will remain in effect (meaning this test can be used) for the duration of the COVID-19 declaration under Section 564(b)(1) of the Act, 21 U.S.C. section 360bbb-3(b)(1), unless the authorization is terminated or revoked sooner. Performed at [facility], [address]  [place], [place] [address]   Urine culture     Status: Abnormal   Collection Time: 05/14/19  6:24 PM   Specimen: In/Out Cath Urine  Result Value Ref Range Status   Specimen Description   Final    IN/OUT CATH URINE Performed at [facility], [address] [place], [place] [address]    Special Requests   Final    NONE Performed at [facility], [address], [place], [place] [address]    Culture >=100,000 COLONIES/mL ESCHERICHIA COLI (A)  Final   Report Status 05/17/2019 FINAL  Final   Organism ID, Bacteria ESCHERICHIA COLI (A)  Final      Susceptibility   Escherichia coli - MIC*    AMPICILLIN >=32 RESISTANT Resistant     CEFAZOLIN <=4 SENSITIVE Sensitive     CEFTRIAXONE <=1 SENSITIVE Sensitive     CIPROFLOXACIN <=0.25 SENSITIVE Sensitive     GENTAMICIN <=1 SENSITIVE Sensitive     IMIPENEM <=0.25 SENSITIVE Sensitive     NITROFURANTOIN <=16 SENSITIVE Sensitive     TRIMETH/SULFA <=20 SENSITIVE Sensitive     AMPICILLIN/SULBACTAM >=32 RESISTANT Resistant     PIP/TAZO <=4 SENSITIVE Sensitive     Extended ESBL NEGATIVE Sensitive     * >=100,000 COLONIES/mL ESCHERICHIA COLI  Blood Culture (routine x 2)     Status: None (Preliminary result)   Collection Time: 05/14/19  7:25 PM   Specimen: BLOOD RIGHT HAND  Result Value Ref Range Status   Specimen Description BLOOD RIGHT HAND  Final   Special Requests   Final    BOTTLES DRAWN AEROBIC AND ANAEROBIC Blood Culture adequate volume   Culture   Final    NO GROWTH 3 DAYS Performed at [facility], [address], [place], [place] [address]    Report Status PENDING  Incomplete  Blood Culture (routine x 2)     Status: None (Preliminary result)   Collection Time: 05/14/19  7:25 PM   Specimen: BLOOD LEFT HAND  Result Value Ref Range Status   Specimen Description BLOOD LEFT HAND  Final   Special Requests   Final    BOTTLES DRAWN AEROBIC AND ANAEROBIC Blood Culture adequate volume   Culture   Final    NO GROWTH 3 DAYS Performed at [facility], [address], [place], [place] [address]    Report Status PENDING  Incomplete  MRSA  PCR Screening     Status: Abnormal   Collection Time: 05/14/19 10:36 PM   Specimen: Nasal Mucosa; Nasopharyngeal  Result Value Ref Range Status   MRSA by PCR POSITIVE (A) NEGATIVE Final    Comment:        The GeneXpert MRSA Assay (FDA approved for NASAL specimens only), is one component of a comprehensive MRSA colonization surveillance program. It is not intended to diagnose MRSA infection nor to guide or monitor treatment for MRSA infections. RESULT CALLED TO, READ BACK BY AND VERIFIED WITH: BROWN S. AT [id number] ON HN:[id number] BY THOMPSON S. Performed at [facility], [address] [place], [place] [address]          Radiology Studies: Ct Angio Chest Pe W Or Wo Contrast  Result Date: 05/15/2019 CLINICAL DATA:  Shortness of breath.  Interstitial lung disease. EXAM: CT ANGIOGRAPHY CHEST WITH CONTRAST TECHNIQUE: Multidetector CT imaging of the chest was performed using the standard protocol during bolus administration of intravenous contrast. Multiplanar CT image reconstructions and MIPs were obtained to evaluate the vascular anatomy. CONTRAST:  27mL OMNIPAQUE IOHEXOL 350 MG/ML SOLN COMPARISON:  12/08/2009 FINDINGS: Cardiovascular: Patient is status post repair of ascending thoracic aortic aneurysm with tube graft. The aneurysm sac is enlarged since prior study measuring 7.0 x 6.6 cm. This measured up to 4.7 cm previously. There appears to be narrowing of the tube graft, question related to mass effect aneurysmal dilatation of the proximal descending thoracic aorta measuring up to 4.8 cm. This is stable. Cardiomegaly. No filling defects in the pulmonary arteries to suggest pulmonary emboli. Mediastinum/Nodes: Small scattered mediastinal lymph nodes. No mediastinal, hilar, or axillary adenopathy. Lungs/Pleura: Large right pleural effusion and small to moderate left pleural effusion. Ground-glass opacities  throughout both lungs, likely edema. Upper Abdomen: Imaging into the upper abdomen shows no acute findings. Musculoskeletal: Chest wall soft tissues are unremarkable. No acute bony abnormality. Review of the MIP images confirms the above findings. IMPRESSION: Prior tube repair of the ascending thoracic aorta. The aneurysm sac as enlarged since prior study measuring 7.0 x 6.6 cm. This measured up to 4.7 cm previously. Findings concerning for graft leak. There is also compressed appearance of the graft lumen suggesting mass effect. 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 Cardiomegaly, coronary artery disease. Large right pleural effusion and moderate left pleural effusion. Ground-glass opacities within the lungs likely reflect edema. Electronically Signed   By: Rolm Baptise M.D.   On: 05/15/2019 22:29        Scheduled Meds:  atorvastatin  20 mg Oral q1800   Chlorhexidine Gluconate Cloth  6 each Topical Q0600   cholecalciferol  1,000 Units Oral Daily   divalproex  500 mg Oral TID   insulin aspart  0-15 Units Subcutaneous TID WC   mupirocin ointment  1 application Nasal BID   omega-3 acid ethyl esters  1 g Oral BID   pantoprazole (PROTONIX) IV  40 mg Intravenous Q24H   QUEtiapine  200 mg Oral Once   QUEtiapine  300 mg Oral BID   risperiDONE  1 mg Oral TID   trihexyphenidyl  5 mg Oral BID WC   Continuous Infusions:  piperacillin-tazobactam (ZOSYN)  IV 3.375 g (05/17/19 0725)     LOS: 3 days        Aline August, MD [facility] 05/17/2019, 10:30 AM

## 2019-05-17 NOTE — TOC Progression Note (Signed)
Transition of Care [facility]) - Progression Note    Patient Details  Name: HESHAM WOMMACK MRN: [id number] Date of Birth: [birth year]  Transition of Care [facility]) CM/SW [place], [place] Phone Number: 05/17/2019, 2:13 PM  Clinical Narrative:    CSW faxed clinical information for [facility] in [place], [place]. Currently facility does not have any beds available today.  CSW will follow up on Sunday for availability.   Expected Discharge Plan: [place] Barriers to Discharge: Continued Medical Work up  Expected Discharge Plan and Services Expected Discharge Plan: [place] arrangements for the past 2 months: [place]                                       Social Determinants of Health (SDOH) Interventions    Readmission Risk Interventions No flowsheet data found.

## 2019-05-17 NOTE — Progress Notes (Signed)
Patient had a dark maroon colored bowel movement x1 at [phone]

## 2019-05-18 DIAGNOSIS — J9601 Acute respiratory failure with hypoxia: Secondary | ICD-10-CM

## 2019-05-18 DIAGNOSIS — Z515 Encounter for palliative care: Secondary | ICD-10-CM

## 2019-05-18 NOTE — Progress Notes (Signed)
Patient ID: Derrick Butler, male   DOB: Aug 22, 1956, 63 y.o.   MRN: [id number]  PROGRESS NOTE    Derrick Butler  [id number] DOB: 1955-11-30 DOA: 05/14/2019 PCP: Hilbert Corrigan, MD   Brief Narrative:  63 year old male with history of stroke, thrombocytopenia, hypertension, hyperlipidemia, diabetes mellitus type 2, coronary artery disease, bipolar disorder, cognitive impairment presented on 05/14/2019 to [facility] from SNF secondary to hematochezia, hypoxia and hypotension.  He had undergone colonoscopy by Dr. Oneida Alar on 05/13/2019: Noted to have poor prep, 2 polyps were removed.  He was discharged back to the nursing home subsequently.  On 05/14/2019, he presented with hematochezia and hypotension with hypoxia requiring supplemental oxygen and elevated creatinine.  Chest x-ray showed vascular congestion.  He was started on broad-spectrum antibiotics and given intravenous Lasix.  He underwent flexible sigmoidoscopy on 05/15/2019 by Dr. Oneida Alar which showed rectal lesion/tear.  Subsequently, he became even more hypotensive, hypoxic and tachycardic and was transferred to ICU.  He was transferred to [facility] under PCCM care.  CTA to rule out PE showed enlarged ascending aortic aneurysm with sac measuring 7.0 into 6.6 cm with concern for graft leak.  After discussion with legal guardian, it was decided that patient would not be a good candidate for surgical intervention and to focus on his comfort.  Palliative care was consulted.  Patient was transferred back to [facility] care on 05/17/2019.  Assessment & Plan:   Ascending aortic aneurysm enlargement with possibility of graft leak on CT of chest Probable lower GI bleeding causing recurrent hematochezia E. coli UTI Possible aspiration pneumonia Acute systolic heart failure with pulmonary edema Dementia with agitated delirium/bipolar disorder/cognitive impairment Acute kidney injury on chronic kidney disease stage III Thrombocytopenia  Essential hypertension Hyperlipidemia  Plan -Patient has been switched to total comfort measures since 05/17/2019.  Awaiting transfer to residential hospice.   Procedures:  Echo on 05/15/2019 IMPRESSIONS    1. The left ventricle has moderate-severely reduced systolic function, with an ejection fraction of 30-35%. The cavity size was normal. Left ventricular diastolic Doppler parameters are indeterminate.  2. Diffuse LV hypokinesis, the apex is akinetic.  3. The right ventricle has normal systolic function. The cavity was normal. There is no increase in right ventricular wall thickness.  4. Left atrial size was moderately dilated.  5. No evidence of mitral valve stenosis.  6. The aortic valve was not well visualized. Moderate thickening of the aortic valve. Moderate calcification of the aortic valve. Aortic valve regurgitation poorly visualized. Moderate aortic annular calcification noted.  7. AV poorly visualized with limited Doppler images. Grossly mild to moderate AI. From limited visualization and very limited Doppler data no clear evidence of significant aortic stenosis.  8. The aorta is abnormal unless otherwise noted.  9. There is moderate dilatation of the aortic root measuring 45 mm. 10. Pulmonary hypertension is indeterminant, inadequate TR jet. 11. The interatrial septum was not well visualized.  FINDINGS  Left Ventricle: The left ventricle has moderate-severely reduced systolic function, with an ejection fraction of 30-35%. The cavity size was normal. There is no increase in left ventricular wall thickness. Left ventricular diastolic Doppler parameters  are indeterminate. Diffuse LV hypokinesis, the apex is akinetic.  Antimicrobials:  Anti-infectives (From admission, onward)   Start     Dose/Rate Route Frequency Ordered Stop   05/15/19 0900  piperacillin-tazobactam (ZOSYN) IVPB 3.375 g  Status:  Discontinued     3.375 g 12.5 mL/hr over 240 Minutes Intravenous Every 8  hours  05/15/19 0857 05/17/19 1049   05/14/19 1900  piperacillin-tazobactam (ZOSYN) IVPB 3.375 g  Status:  Discontinued     3.375 g 100 mL/hr over 30 Minutes Intravenous  Once 05/14/19 1851 05/15/19 0857   05/14/19 1900  vancomycin (VANCOCIN) IVPB 1000 mg/200 mL premix     1,000 mg 200 mL/hr over 60 Minutes Intravenous  Once 05/14/19 1851 05/14/19 2122       Subjective: Patient seen and examined at bedside.  Patient is sleepy, hardly wakes up.  Nursing staff reports no further rectal bleeding. Objective: Vitals:   05/16/19 1829 05/16/19 2133 05/17/19 0547 05/17/19 1320  BP: 119/75 96/85 (!) 115/98 123/78  Pulse: 90 89 86 80  Resp: 17  (!) 25 (!) 22  Temp: 97.7 F (36.5 C)  98.8 F (37.1 C)   TempSrc:   Oral   SpO2: 97% (!) 79% 97% 100%  Weight:        Intake/Output Summary (Last 24 hours) at 05/18/2019 1005 Last data filed at 05/17/2019 1619 Gross per 24 hour  Intake -  Output 650 ml  Net -650 ml   Filed Weights   05/14/19 2237 05/16/19 0500  Weight: 77 kg 76.5 kg    Examination:  General exam: Sleepy, hardly wakes up.  Looks comfortable.  No labored breathing   Data Reviewed: I have personally reviewed following labs and imaging studies  CBC: Recent Labs  Lab 05/14/19 1756  05/15/19 0443 05/15/19 0850 05/15/19 1526 05/16/19 0347 05/17/19 0315  WBC 9.2  --  4.7  --  5.2 5.5 4.6  NEUTROABS 3.9  --   --   --  3.3 3.4  --   HGB 10.8*   < > 9.2* 10.0* 9.9*  9.8* 9.6* 9.0*  HCT 35.3*   < > 28.0* 30.6* 30.8*  30.9* 29.5* 27.1*  MCV 104.1*  --  97.2  --  99.4 97.0 97.1  PLT 72*  --  43*  --  49* 44* 39*   < > = values in this interval not displayed.   Basic Metabolic Panel: Recent Labs  Lab 05/13/19 1033 05/14/19 1756 05/14/19 1801 05/15/19 0443 05/16/19 0347  NA 143 138 139 138 138  K 5.2* 5.1 5.2* 4.5 5.0  CL 107 103 106 112* 107  CO2 23 16*  --  19* 21*  GLUCOSE 73 193* 177* 132* 131*  BUN 46* 44* 40* 43* 44*  CREATININE 2.35* 2.78* 2.60* 2.22*  3.07*  CALCIUM 8.7* 8.4*  --  7.4* 8.1*  MG  --   --   --   --  2.0  PHOS  --   --   --   --  4.4   GFR: Estimated Creatinine Clearance: 22.5 mL/min (A) (by C-G formula based on SCr of 3.07 mg/dL (H)). Liver Function Tests: Recent Labs  Lab 05/14/19 1756 05/15/19 0443 05/16/19 0347  AST 37 28 48*  ALT 14 14 19   ALKPHOS 50 41 54  BILITOT 0.8 0.7 0.7  PROT 5.3* 4.4* 4.9*  ALBUMIN 3.0* 2.5* 2.7*   No results for input(s): LIPASE, AMYLASE in the last 168 hours. No results for input(s): AMMONIA in the last 168 hours. Coagulation Profile: Recent Labs  Lab 05/14/19 1756  INR 1.5*   Cardiac Enzymes: No results for input(s): CKTOTAL, CKMB, CKMBINDEX, TROPONINI in the last 168 hours. BNP (last 3 results) No results for input(s): PROBNP in the last 8760 hours. HbA1C: No results for input(s): HGBA1C in the last 72 hours. CBG:  Recent Labs  Lab 05/16/19 1157 05/16/19 1611 05/17/19 0745 05/17/19 0902 05/17/19 1212  GLUCAP 110* 138* 162* 157* 95   Lipid Profile: No results for input(s): CHOL, HDL, LDLCALC, TRIG, CHOLHDL, LDLDIRECT in the last 72 hours. Thyroid Function Tests: No results for input(s): TSH, T4TOTAL, FREET4, T3FREE, THYROIDAB in the last 72 hours. Anemia Panel: No results for input(s): VITAMINB12, FOLATE, FERRITIN, TIBC, IRON, RETICCTPCT in the last 72 hours. Sepsis Labs: Recent Labs  Lab 05/14/19 1756 05/14/19 2104 05/15/19 0443 05/15/19 2058 05/15/19 2300 05/17/19 0315  PROCALCITON  --   --  0.45  --   --  2.06  LATICACIDVEN >11.0* 3.3*  --  6.6* 3.8*  --     Recent Results (from the past 240 hour(s))  SARS Coronavirus 2 [facility] order, Performed in [facility] hospital lab) Nasopharyngeal Nasopharyngeal Swab     Status: None   Collection Time: 05/13/19  8:00 AM   Specimen: Nasopharyngeal Swab  Result Value Ref Range Status   SARS Coronavirus 2 NEGATIVE NEGATIVE Final    Comment: (NOTE) If result is NEGATIVE SARS-CoV-2 target nucleic acids are  NOT DETECTED. The SARS-CoV-2 RNA is generally detectable in upper and lower  respiratory specimens during the acute phase of infection. The lowest  concentration of SARS-CoV-2 viral copies this assay can detect is 250  copies / mL. A negative result does not preclude SARS-CoV-2 infection  and should not be used as the sole basis for treatment or other  patient management decisions.  A negative result may occur with  improper specimen collection / handling, submission of specimen other  than nasopharyngeal swab, presence of viral mutation(s) within the  areas targeted by this assay, and inadequate number of viral copies  (<250 copies / mL). A negative result must be combined with clinical  observations, patient history, and epidemiological information. If result is POSITIVE SARS-CoV-2 target nucleic acids are DETECTED. The SARS-CoV-2 RNA is generally detectable in upper and lower  respiratory specimens dur ing the acute phase of infection.  Positive  results are indicative of active infection with SARS-CoV-2.  Clinical  correlation with patient history and other diagnostic information is  necessary to determine patient infection status.  Positive results do  not rule out bacterial infection or co-infection with other viruses. If result is PRESUMPTIVE POSTIVE SARS-CoV-2 nucleic acids MAY BE PRESENT.   A presumptive positive result was obtained on the submitted specimen  and confirmed on repeat testing.  While 2019 novel coronavirus  (SARS-CoV-2) nucleic acids may be present in the submitted sample  additional confirmatory testing may be necessary for epidemiological  and / or clinical management purposes  to differentiate between  SARS-CoV-2 and other Sarbecovirus currently known to infect humans.  If clinically indicated additional testing with an alternate test  methodology [id number]) is advised. The SARS-CoV-2 RNA is generally  detectable in upper and lower respiratory sp ecimens  during the acute  phase of infection. The expected result is Negative. Fact Sheet for Patients:  StrictlyIdeas.no Fact Sheet for Healthcare Providers: BankingDealers.co.za This test is not yet approved or cleared by the [place] FDA and has been authorized for detection and/or diagnosis of SARS-CoV-2 by FDA under an Emergency Use Authorization (EUA).  This EUA will remain in effect (meaning this test can be used) for the duration of the COVID-19 declaration under Section 564(b)(1) of the Act, 21 U.S.C. section 360bbb-3(b)(1), unless the authorization is terminated or revoked sooner. Performed at [facility], [address], [place], [place]  [address]   Urine culture     Status: Abnormal   Collection Time: 05/14/19  6:24 PM   Specimen: In/Out Cath Urine  Result Value Ref Range Status   Specimen Description   Final    IN/OUT CATH URINE Performed at [facility], [address], [place], [place] [address]    Special Requests   Final    NONE Performed at [facility], [address], [place], [place] [address]    Culture >=100,000 COLONIES/mL ESCHERICHIA COLI (A)  Final   Report Status 05/17/2019 FINAL  Final   Organism ID, Bacteria ESCHERICHIA COLI (A)  Final      Susceptibility   Escherichia coli - MIC*    AMPICILLIN >=32 RESISTANT Resistant     CEFAZOLIN <=4 SENSITIVE Sensitive     CEFTRIAXONE <=1 SENSITIVE Sensitive     CIPROFLOXACIN <=0.25 SENSITIVE Sensitive     GENTAMICIN <=1 SENSITIVE Sensitive     IMIPENEM <=0.25 SENSITIVE Sensitive     NITROFURANTOIN <=16 SENSITIVE Sensitive     TRIMETH/SULFA <=20 SENSITIVE Sensitive     AMPICILLIN/SULBACTAM >=32 RESISTANT Resistant     PIP/TAZO <=4 SENSITIVE Sensitive     Extended ESBL NEGATIVE Sensitive     * >=100,000 COLONIES/mL ESCHERICHIA COLI  Blood Culture (routine x 2)     Status: None (Preliminary result)   Collection Time: 05/14/19  7:25 PM   Specimen: BLOOD RIGHT HAND   Result Value Ref Range Status   Specimen Description BLOOD RIGHT HAND  Final   Special Requests   Final    BOTTLES DRAWN AEROBIC AND ANAEROBIC Blood Culture adequate volume   Culture   Final    NO GROWTH 3 DAYS Performed at [facility], [address], [place], [place] [address]    Report Status PENDING  Incomplete  Blood Culture (routine x 2)     Status: None (Preliminary result)   Collection Time: 05/14/19  7:25 PM   Specimen: BLOOD LEFT HAND  Result Value Ref Range Status   Specimen Description BLOOD LEFT HAND  Final   Special Requests   Final    BOTTLES DRAWN AEROBIC AND ANAEROBIC Blood Culture adequate volume   Culture   Final    NO GROWTH 3 DAYS Performed at [facility], [address] [place], [place] [address]    Report Status PENDING  Incomplete  MRSA PCR Screening     Status: Abnormal   Collection Time: 05/14/19 10:36 PM   Specimen: Nasal Mucosa; Nasopharyngeal  Result Value Ref Range Status   MRSA by PCR POSITIVE (A) NEGATIVE Final    Comment:        The GeneXpert MRSA Assay (FDA approved for NASAL specimens only), is one component of a comprehensive MRSA colonization surveillance program. It is not intended to diagnose MRSA infection nor to guide or monitor treatment for MRSA infections. RESULT CALLED TO, READ BACK BY AND VERIFIED WITH: BROWN S. AT [id number] ON [id number] BY THOMPSON S. Performed at [facility], [address], [place], [place] [address]          Radiology Studies: No results found.      Scheduled Meds: . Chlorhexidine Gluconate Cloth  6 each Topical Q0600  . divalproex  500 mg Oral TID  . LORazepam  2 mg Intravenous Q4H  . mupirocin ointment  1 application Nasal BID  . pantoprazole (PROTONIX) IV  40 mg Intravenous Q24H  . QUEtiapine  300 mg Oral BID  . risperiDONE  1 mg Oral TID  .  trihexyphenidyl  5 mg Oral BID WC   Continuous Infusions:    LOS: 4 days        Aline August, MD [facility] 05/18/2019, 10:05 AM

## 2019-05-18 NOTE — Progress Notes (Signed)
Palliative Medicine   Name: Derrick Butler Date: 05/18/2019 MRN: [id number]  DOB: [birth year]  Patient Care Team: Hilbert Corrigan, MD as PCP - General (Internal Medicine) Danie Binder, MD as Consulting Physician (Gastroenterology)    REASON FOR CONSULTATION: 63 y.o. male  with past medical history of aortic aneurysm dissection s/p graft, CVA, dementia- residing at [facility], CKD  admitted on 05/14/2019 with GIB, UTI and possible aspiration PNA. Further workup during admission found widening aneurysm with possible leak. During admission he has been hypotensive. Per discussion with his [facility]- they have decided not to pursue further treatment or diagnostics for his aortic aneurysm. Family opted to transition him to full comfort care and he is pending transfer to hospice facility.   CODE STATUS: DNR  PAST MEDICAL HISTORY: Past Medical History:  Diagnosis Date  . Agitation    and aggression  . Altered mental status    with psychosis  . Anemia   . Aortic dissection ([place])   . Aortic dissection ([place])   . Bipolar 1 disorder ([place])   . Cardiomyopathy   . Cardiomyopathy ([place])   . CKD (chronic kidney disease)   . Coronary artery disease   . CVA (cerebral infarction)   . CVA (cerebral infarction)   . Dehydration   . Diabetes mellitus   . Dysphagia   . Encephalopathy   . Hyperglycemia   . Hyperkalemia   . Hyperlipemia   . Hypertension   . Hypertension   . Hyponatremia   . Lack of coordination   . Poor historian   . Psychosis ([place])   . Psychosis ([place])   . Thrombocytopenia ([place])   . TIA (transient ischemic attack)     PAST SURGICAL HISTORY:  Past Surgical History:  Procedure Laterality Date  . ASCENDING AORTIC ANEURYSM REPAIR    . BIOPSY  12/21/2015   Procedure: BIOPSY;  Surgeon: Danie Binder, MD;  [facility];  Service: Endoscopy;;  gastric biopsy  . BIOPSY  05/13/2019   Procedure: BIOPSY;  Surgeon: Danie Binder, MD;  [facility];   Service: Endoscopy;;  rectum  . COLONOSCOPY  05/13/2019   Dr. Oneida Alar: Incomplete due to poor bowel prep.  2 polyps removed via cold snare, one from the hepatic flexure and one from the proximal ascending colon.  Red blood noted in the rectum, status post biopsy.  External hemorrhoids.  Repeat colonoscopy in 3 months due to poor prep.  Biopsy showed tubular adenomas, rectal biopsy consistent with ulcer  . COLONOSCOPY WITH PROPOFOL N/A 12/21/2015   [facility]: one 4mm polyp in the ascending colon , removed with a cold biopsy forceps. resectedand retrieved.  four 6- 8 mm polyps in the rectum , in the ascending colon and in the cedum, removed with a hot snare. resected and retrieved.  . COLONOSCOPY WITH PROPOFOL N/A 05/13/2019   Procedure: COLONOSCOPY WITH PROPOFOL;  Surgeon: Danie Binder, MD;  [facility];  Service: Endoscopy;  Laterality: N/A;  10:30am  . ESOPHAGOGASTRODUODENOSCOPY (EGD) WITH PROPOFOL N/A 12/21/2015   SL[facility]: Web in the distal esophagus dilated. Erosive gastropathy, biopsied normal duodenal bulb and second portion of the duodenum.   [name] FLEXIBLE SIGMOIDOSCOPY N/A 05/15/2019   Procedure: FLEXIBLE SIGMOIDOSCOPY;  Surgeon: Danie Binder, MD;  [facility];  Service: Endoscopy;  Laterality: N/A;  . POLYPECTOMY  12/21/2015   Procedure: POLYPECTOMY;  Surgeon: Danie Binder, MD;  [facility];  Service: Endoscopy;;  at cecum and ascending colon; rectum  . POLYPECTOMY  05/13/2019   Procedure: POLYPECTOMY;  Surgeon: Danie Binder, MD;  [facility];  Service: Endoscopy;;  colon  . SAVORY DILATION N/A 12/21/2015   Procedure: SAVORY DILATION;  Surgeon: Danie Binder, MD;  [facility];  Service: Endoscopy;  Laterality: N/A;    HEMATOLOGY/ONCOLOGY HISTORY:  Oncology History   No history exists.    ALLERGIES:  has No Known Allergies.  MEDICATIONS:  Current Facility-Administered Medications  Medication Dose Route Frequency Provider Last Rate  Last Dose  . acetaminophen (TYLENOL) tablet 650 mg  650 mg Oral Q6H PRN Fields, Sandi L, MD       Or  . acetaminophen (TYLENOL) suppository 650 mg  650 mg Rectal Q6H PRN Fields, Sandi L, MD      . antiseptic oral rinse (BIOTENE) solution 15 mL  15 mL Topical PRN Earlie Counts, NP      . Chlorhexidine Gluconate Cloth 2 % PADS 6 each  6 each Topical Q0600 Danie Binder, MD   6 each at 05/18/19 [phone]  . divalproex (DEPAKOTE SPRINKLE) capsule 500 mg  500 mg Oral TID Barney Drain L, MD   500 mg at 05/17/19 0904  . glycopyrrolate (ROBINUL) tablet 1 mg  1 mg Oral Q4H PRN Earlie Counts, NP       Or  . glycopyrrolate (ROBINUL) injection 0.2 mg  0.2 mg Subcutaneous Q4H PRN Earlie Counts, NP       Or  . glycopyrrolate (ROBINUL) injection 0.2 mg  0.2 mg Intravenous Q4H PRN Earlie Counts, NP      . haloperidol (HALDOL) tablet 0.5 mg  0.5 mg Oral Q4H PRN Earlie Counts, NP       Or  . haloperidol (HALDOL) 2 MG/ML solution 0.5 mg  0.5 mg Sublingual Q4H PRN Earlie Counts, NP       Or  . haloperidol lactate (HALDOL) injection 0.5 mg  0.5 mg Intravenous Q4H PRN Earlie Counts, NP   0.5 mg at 05/17/19 1324  . LORazepam (ATIVAN) injection 2 mg  2 mg Intravenous Q4H Earlie Counts, NP   2 mg at 05/18/19 1251  . morphine 2 MG/ML injection 2 mg  2 mg Intravenous Q2H PRN Aline August, MD   2 mg at 05/18/19 1251  . mupirocin ointment (BACTROBAN) 2 % 1 application  1 application Nasal BID Danie Binder, MD   1 application at [phone]  . ondansetron (ZOFRAN) tablet 4 mg  4 mg Oral Q6H PRN Fields, Sandi L, MD       Or  . ondansetron (ZOFRAN) injection 4 mg  4 mg Intravenous Q6H PRN Fields, Sandi L, MD      . pantoprazole (PROTONIX) injection 40 mg  40 mg Intravenous Q24H Corey Harold, NP   40 mg at 05/17/19 1933  . polyvinyl alcohol (LIQUIFILM TEARS) 1.4 % ophthalmic solution 1 drop  1 drop Both Eyes QID PRN Earlie Counts, NP      . QUEtiapine (SEROQUEL) tablet 300 mg  300 mg Oral BID Alekh,  Kshitiz, MD      . risperiDONE (RISPERDAL M-TABS) disintegrating tablet 1 mg  1 mg Oral TID Aline August, MD   1 mg at 05/17/19 1141  . trihexyphenidyl (ARTANE) tablet 5 mg  5 mg Oral BID WC Fields, Sandi L, MD   5 mg at 05/17/19 0904    VITAL SIGNS: BP  126/65 (BP Location: Right Arm)   Pulse 77   Temp 98.4 F (36.9 C)   Resp (!) 22   Wt 168 lb 10.4 oz (76.5 kg)   SpO2 100%   BMI 27.22 kg/m  Filed Weights   05/14/19 2237 05/16/19 0500  Weight: 169 lb 12.1 oz (77 kg) 168 lb 10.4 oz (76.5 kg)    Estimated body mass index is 27.22 kg/m as calculated from the following:   Height as of 03/18/19: 5\' 6"  (1.676 m).   Weight as of this encounter: 168 lb 10.4 oz (76.5 kg).  LABS: CBC:    Component Value Date/Time   WBC 4.6 05/17/2019 0315   HGB 9.0 (L) 05/17/2019 0315   HCT 27.1 (L) 05/17/2019 0315   PLT 39 (L) 05/17/2019 0315   MCV 97.1 05/17/2019 0315   NEUTROABS 3.4 05/16/2019 0347   LYMPHSABS 1.4 05/16/2019 0347   MONOABS 0.6 05/16/2019 0347   EOSABS 0.0 05/16/2019 0347   BASOSABS 0.0 05/16/2019 0347   Comprehensive Metabolic Panel:    Component Value Date/Time   NA 138 05/16/2019 0347   K 5.0 05/16/2019 0347   CL 107 05/16/2019 0347   CO2 21 (L) 05/16/2019 0347   BUN 44 (H) 05/16/2019 0347   CREATININE 3.07 (H) 05/16/2019 0347   CREATININE 2.31 (H) 06/06/2013 1429   GLUCOSE 131 (H) 05/16/2019 0347   CALCIUM 8.1 (L) 05/16/2019 0347   AST 48 (H) 05/16/2019 0347   ALT 19 05/16/2019 0347   ALKPHOS 54 05/16/2019 0347   BILITOT 0.7 05/16/2019 0347   PROT 4.9 (L) 05/16/2019 0347   ALBUMIN 2.7 (L) 05/16/2019 0347    RADIOGRAPHIC STUDIES: Ct Angio Chest Pe W Or Wo Contrast  Result Date: 05/15/2019 CLINICAL DATA:  Shortness of breath.  Interstitial lung disease. EXAM: CT ANGIOGRAPHY CHEST WITH CONTRAST TECHNIQUE: Multidetector CT imaging of the chest was performed using the standard protocol during bolus administration of intravenous contrast. Multiplanar CT image  reconstructions and MIPs were obtained to evaluate the vascular anatomy. CONTRAST:  83mL OMNIPAQUE IOHEXOL 350 MG/ML SOLN COMPARISON:  12/08/2009 FINDINGS: Cardiovascular: Patient is status post repair of ascending thoracic aortic aneurysm with tube graft. The aneurysm sac is enlarged since prior study measuring 7.0 x 6.6 cm. This measured up to 4.7 cm previously. There appears to be narrowing of the tube graft, question related to mass effect aneurysmal dilatation of the proximal descending thoracic aorta measuring up to 4.8 cm. This is stable. Cardiomegaly. No filling defects in the pulmonary arteries to suggest pulmonary emboli. Mediastinum/Nodes: Small scattered mediastinal lymph nodes. No mediastinal, hilar, or axillary adenopathy. Lungs/Pleura: Large right pleural effusion and small to moderate left pleural effusion. Ground-glass opacities throughout both lungs, likely edema. Upper Abdomen: Imaging into the upper abdomen shows no acute findings. Musculoskeletal: Chest wall soft tissues are unremarkable. No acute bony abnormality. Review of the MIP images confirms the above findings. IMPRESSION: Prior tube repair of the ascending thoracic aorta. The aneurysm sac as enlarged since prior study measuring 7.0 x 6.6 cm. This measured up to 4.7 cm previously. Findings concerning for graft leak. There is also compressed appearance of the graft lumen suggesting mass effect. 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 Cardiomegaly, coronary artery disease. Large right pleural effusion and moderate left pleural effusion. Ground-glass opacities within the lungs likely reflect edema. Electronically Signed   By: Rolm Baptise M.D.   On: 05/15/2019 22:29   Dg Chest Portable 1 View  Result Date: 05/14/2019 CLINICAL DATA:  Weakness EXAM: PORTABLE CHEST 1 VIEW COMPARISON:  04/13/2017 FINDINGS: Prior median sternotomy. Cardiomegaly with vascular congestion. Interstitial  prominence likely reflects interstitial edema. No visible effusions or pneumothorax. No acute bony abnormality. IMPRESSION: Cardiomegaly with vascular congestion and probable early edema/CHF. Electronically Signed   By: Rolm Baptise M.D.   On: 05/14/2019 19:17    PERFORMANCE STATUS (ECOG) : 4 - Bedbound  Review of Systems Unless otherwise noted, a complete review of systems is negative.  Physical Exam General: critically ill appearing Cardiovascular: tachy Pulmonary: unlabored Extremities: no edema, no joint deformities Skin: no rashes Neurological: poorly responsive  IMPRESSION: Follow up visit. Patient was transitioned yesterday to full comfort care. He is comfortable appearing. No family at bedside.   Case discussed with RN.   PLAN: -Comfort care -Residential hospice when bed is available    Time Total: 15 minutes  Visit consisted of counseling and education dealing with the complex and emotionally intense issues of symptom management and palliative care in the setting of serious and potentially life-threatening illness.Greater than 50%  of this time was spent counseling and coordinating care related to the above assessment and plan.  Signed by: Altha Harm, PhD, NP-C [phone] (Work Cell)

## 2019-05-19 LAB — CULTURE, BLOOD (ROUTINE X 2)
Culture: NO GROWTH
Culture: NO GROWTH
Special Requests: ADEQUATE
Special Requests: ADEQUATE

## 2019-05-19 MED ORDER — MORPHINE SULFATE 20 MG/5ML PO SOLN: 10.0000 mg | ORAL | Status: AC | PRN

## 2019-05-19 MED ORDER — HALOPERIDOL 0.5 MG PO TABS: 0.5000 mg | ORAL_TABLET | ORAL | Status: AC | PRN

## 2019-05-19 MED ORDER — ONDANSETRON HCL 4 MG PO TABS: 4.0000 mg | ORAL_TABLET | Freq: Four times a day (QID) | ORAL | Status: AC | PRN

## 2019-05-19 MED ORDER — GLYCOPYRROLATE 1 MG PO TABS: 1.0000 mg | ORAL_TABLET | ORAL | Status: AC | PRN

## 2019-05-19 MED ORDER — LORAZEPAM 1 MG PO TABS: 1.0000 mg | ORAL_TABLET | Freq: Four times a day (QID) | ORAL | Status: AC | PRN

## 2019-05-19 NOTE — Discharge Summary (Signed)
Physician Discharge Summary  Derrick Butler [id number] DOB: [birth year] DOA: 05/14/2019  PCP: Hilbert Corrigan, MD  Admit date: 05/14/2019 Discharge date: 05/19/2019  Admitted From: SNF Disposition: Residential hospice  Recommendations for Outpatient Follow-up:  1. Follow up with residential hospice provider at earliest [place]: No Equipment/Devices: None  Discharge Condition: Poor CODE STATUS: DNR Diet recommendation: Per comfort measures  Brief/Interim Summary: 63 year old male with history of stroke, thrombocytopenia, hypertension, hyperlipidemia, diabetes mellitus type 2, coronary artery disease, bipolar disorder, cognitive impairment presented on 05/14/2019 to [facility] from SNF secondary to hematochezia, hypoxia and hypotension.  He had undergone colonoscopy by Dr. Oneida Alar on 05/13/2019: Noted to have poor prep, 2 polyps were removed.  He was discharged back to the nursing home subsequently.  On 05/14/2019, he presented with hematochezia and hypotension with hypoxia requiring supplemental oxygen and elevated creatinine.  Chest x-ray showed vascular congestion.  He was started on broad-spectrum antibiotics and given intravenous Lasix.  He underwent flexible sigmoidoscopy on 05/15/2019 by Dr. Oneida Alar which showed rectal lesion/tear.  Subsequently, he became even more hypotensive, hypoxic and tachycardic and was transferred to ICU.  He was transferred to [facility] under PCCM care.  CTA to rule out PE showed enlarged ascending aortic aneurysm with sac measuring 7.0 into 6.6 cm with concern for graft leak.  After discussion with legal guardian, it was decided that patient would not be a good candidate for surgical intervention and to focus on his comfort.  Palliative care was consulted.  Patient was transferred back to [facility] care on 05/17/2019.  He is overall very poor prognosis, neurosurgeon agreed for full comfort measures on 05/17/2019.  He will be discharged  to residential hospice once bed is available.  Discharge Diagnoses:  Ascending aortic aneurysm enlargement with possibility of graft leak on CT of chest Probable lower GI bleeding causing recurrent hematochezia E. coli UTI Possible aspiration pneumonia Acute systolic heart failure with pulmonary edema Dementia with agitated delirium/bipolar disorder/cognitive impairment Acute kidney injury on chronic kidney disease stage III Thrombocytopenia Essential hypertension Hyperlipidemia  Plan -Patient has been switched to total comfort measures since 05/17/2019.  Awaiting transfer to residential hospice. He will be discharged to residential hospice once bed is available.    Discharge Instructions   Allergies as of 05/19/2019   No Known Allergies     Medication List    STOP taking these medications   aspirin EC 81 MG tablet   atorvastatin 20 MG tablet Commonly known as: LIPITOR   Biotin 5 MG Tbdp   cholecalciferol 25 MCG (1000 UT) tablet Commonly known as: VITAMIN D3   famotidine 20 MG tablet Commonly known as: PEPCID   fluticasone 50 MCG/ACT nasal spray Commonly known as: FLONASE   labetalol 100 MG tablet Commonly known as: NORMODYNE   Levemir FlexPen 100 UNIT/ML Pen Generic drug: Insulin Detemir   omega-3 acid ethyl esters 1 g capsule Commonly known as: LOVAZA     TAKE these medications   acetaminophen 325 MG tablet Commonly known as: TYLENOL Take 650 mg by mouth every 6 (six) hours as needed for mild pain, moderate pain or fever.   divalproex 125 MG capsule Commonly known as: DEPAKOTE SPRINKLE Take 500 mg by mouth 3 (three) times daily. (0900, 1300, & 1700)   glycopyrrolate 1 MG tablet Commonly known as: ROBINUL Take 1 tablet (1 mg total) by mouth every 4 (four) hours as needed (excessive secretions).   haloperidol 0.5 MG tablet Commonly known as: HALDOL  Take 1 tablet (0.5 mg total) by mouth every 4 (four) hours as needed for agitation (or delirium).    LORazepam 1 MG tablet Commonly known as: Ativan Take 1 tablet (1 mg total) by mouth every 6 (six) hours as needed for anxiety.   morphine 20 MG/5ML solution Take 2.5 mLs (10 mg total) by mouth every 2 (two) hours as needed for pain.   ondansetron 4 MG tablet Commonly known as: ZOFRAN Take 1 tablet (4 mg total) by mouth every 6 (six) hours as needed for nausea.   QUEtiapine 300 MG tablet Commonly known as: SEROQUEL Take 300 mg by mouth 2 (two) times daily. (0900 & 2100)   risperiDONE 1 MG tablet Commonly known as: RisperDAL Take 1 tablet (1 mg total) by mouth 3 (three) times daily.   trihexyphenidyl 5 MG tablet Commonly known as: ARTANE Take 5 mg by mouth 2 (two) times daily with a meal. (0900 & 2100)      Contact information for after-discharge care    [facility] .   Service: Skilled Nursing Contact information: [place] [phone]             No Known Allergies  Consultations:  PCCM/palliative care/GI   Procedures/Studies: Ct Angio Chest Pe W Or Wo Contrast  Result Date: 05/15/2019 CLINICAL DATA:  Shortness of breath.  Interstitial lung disease. EXAM: CT ANGIOGRAPHY CHEST WITH CONTRAST TECHNIQUE: Multidetector CT imaging of the chest was performed using the standard protocol during bolus administration of intravenous contrast. Multiplanar CT image reconstructions and MIPs were obtained to evaluate the vascular anatomy. CONTRAST:  32mL OMNIPAQUE IOHEXOL 350 MG/ML SOLN COMPARISON:  12/08/2009 FINDINGS: Cardiovascular: Patient is status post repair of ascending thoracic aortic aneurysm with tube graft. The aneurysm sac is enlarged since prior study measuring 7.0 x 6.6 cm. This measured up to 4.7 cm previously. There appears to be narrowing of the tube graft, question related to mass effect aneurysmal dilatation of the proximal descending thoracic aorta measuring up to 4.8 cm. This is  stable. Cardiomegaly. No filling defects in the pulmonary arteries to suggest pulmonary emboli. Mediastinum/Nodes: Small scattered mediastinal lymph nodes. No mediastinal, hilar, or axillary adenopathy. Lungs/Pleura: Large right pleural effusion and small to moderate left pleural effusion. Ground-glass opacities throughout both lungs, likely edema. Upper Abdomen: Imaging into the upper abdomen shows no acute findings. Musculoskeletal: Chest wall soft tissues are unremarkable. No acute bony abnormality. Review of the MIP images confirms the above findings. IMPRESSION: Prior tube repair of the ascending thoracic aorta. The aneurysm sac as enlarged since prior study measuring 7.0 x 6.6 cm. This measured up to 4.7 cm previously. Findings concerning for graft leak. There is also compressed appearance of the graft lumen suggesting mass effect. 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 Cardiomegaly, coronary artery disease. Large right pleural effusion and moderate left pleural effusion. Ground-glass opacities within the lungs likely reflect edema. Electronically Signed   By: Rolm Baptise M.D.   On: 05/15/2019 22:29   Dg Chest Portable 1 View  Result Date: 05/14/2019 CLINICAL DATA:  Weakness EXAM: PORTABLE CHEST 1 VIEW COMPARISON:  04/13/2017 FINDINGS: Prior median sternotomy. Cardiomegaly with vascular congestion. Interstitial prominence likely reflects interstitial edema. No visible effusions or pneumothorax. No acute bony abnormality. IMPRESSION: Cardiomegaly with vascular congestion and probable early edema/CHF. Electronically Signed   By: Rolm Baptise M.D.   On: 05/14/2019 19:17  Echo on 05/15/2019 IMPRESSIONS   1. The left ventricle has moderate-severely reduced systolic function, with an ejection fraction of 30-35%. The cavity size was normal. Left ventricular diastolic Doppler parameters are indeterminate. 2. Diffuse LV hypokinesis, the apex is  akinetic. 3. The right ventricle has normal systolic function. The cavity was normal. There is no increase in right ventricular wall thickness. 4. Left atrial size was moderately dilated. 5. No evidence of mitral valve stenosis. 6. The aortic valve was not well visualized. Moderate thickening of the aortic valve. Moderate calcification of the aortic valve. Aortic valve regurgitation poorly visualized. Moderate aortic annular calcification noted. 7. AV poorly visualized with limited Doppler images. Grossly mild to moderate AI. From limited visualization and very limited Doppler data no clear evidence of significant aortic stenosis. 8. The aorta is abnormal unless otherwise noted. 9. There is moderate dilatation of the aortic root measuring 45 mm. 10. Pulmonary hypertension is indeterminant, inadequate TR jet. 11. The interatrial septum was not well visualized.  FINDINGS Left Ventricle: The left ventricle has moderate-severely reduced systolic function, with an ejection fraction of 30-35%. The cavity size was normal. There is no increase in left ventricular wall thickness. Left ventricular diastolic Doppler parameters  are indeterminate. Diffuse LV hypokinesis, the apex is akinetic.  Subjective: Patient seen and examined at bedside.  Patient is sleepy, hardly wakes up  Discharge Exam: Vitals:   05/18/19 2159 05/18/19 2200  BP: 128/66   Pulse: 78   Resp: 20   Temp:  97.8 F (36.6 C)  SpO2: 100%     General exam: Sleepy, hardly wakes up.  Looks comfortable.  No labored breathing   The results of significant diagnostics from this hospitalization (including imaging, microbiology, ancillary and laboratory) are listed below for reference.     Microbiology: Recent Results (from the past 240 hour(s))  SARS Coronavirus 2 [facility] order, Performed in [facility] hospital lab) Nasopharyngeal Nasopharyngeal Swab     Status: None   Collection Time: 05/13/19  8:00 AM   Specimen:  Nasopharyngeal Swab  Result Value Ref Range Status   SARS Coronavirus 2 NEGATIVE NEGATIVE Final    Comment: (NOTE) If result is NEGATIVE SARS-CoV-2 target nucleic acids are NOT DETECTED. The SARS-CoV-2 RNA is generally detectable in upper and lower  respiratory specimens during the acute phase of infection. The lowest  concentration of SARS-CoV-2 viral copies this assay can detect is 250  copies / mL. A negative result does not preclude SARS-CoV-2 infection  and should not be used as the sole basis for treatment or other  patient management decisions.  A negative result may occur with  improper specimen collection / handling, submission of specimen other  than nasopharyngeal swab, presence of viral mutation(s) within the  areas targeted by this assay, and inadequate number of viral copies  (<250 copies / mL). A negative result must be combined with clinical  observations, patient history, and epidemiological information. If result is POSITIVE SARS-CoV-2 target nucleic acids are DETECTED. The SARS-CoV-2 RNA is generally detectable in upper and lower  respiratory specimens dur ing the acute phase of infection.  Positive  results are indicative of active infection with SARS-CoV-2.  Clinical  correlation with patient history and other diagnostic information is  necessary to determine patient infection status.  Positive results do  not rule out bacterial infection or co-infection with other viruses. If result is PRESUMPTIVE POSTIVE SARS-CoV-2 nucleic acids MAY BE PRESENT.   A presumptive positive result was obtained on the submitted specimen  and confirmed on repeat testing.  While 2019 novel coronavirus  (SARS-CoV-2) nucleic acids may be present in the submitted sample  additional confirmatory testing may be necessary for epidemiological  and / or clinical management purposes  to differentiate between  SARS-CoV-2 and other Sarbecovirus currently known to infect humans.  If clinically  indicated additional testing with an alternate test  methodology [phone]) is advised. The SARS-CoV-2 RNA is generally  detectable in upper and lower respiratory sp ecimens during the acute  phase of infection. The expected result is Negative. Fact Sheet for Patients:  StrictlyIdeas.no Fact Sheet for Healthcare Providers: BankingDealers.co.za This test is not yet approved or cleared by the [place] FDA and has been authorized for detection and/or diagnosis of SARS-CoV-2 by FDA under an Emergency Use Authorization (EUA).  This EUA will remain in effect (meaning this test can be used) for the duration of the COVID-19 declaration under Section 564(b)(1) of the Act, 21 U.S.C. section 360bbb-3(b)(1), unless the authorization is terminated or revoked sooner. Performed at [facility], [address] [place], [place] [address]   Urine culture     Status: Abnormal   Collection Time: 05/14/19  6:24 PM   Specimen: In/Out Cath Urine  Result Value Ref Range Status   Specimen Description   Final    IN/OUT CATH URINE Performed at [facility], [address], [place], [place] [address]    Special Requests   Final    NONE Performed at [facility], [address] [place], [place] [address]    Culture >=100,000 COLONIES/mL ESCHERICHIA COLI (A)  Final   Report Status 05/17/2019 FINAL  Final   Organism ID, Bacteria ESCHERICHIA COLI (A)  Final      Susceptibility   Escherichia coli - MIC*    AMPICILLIN >=32 RESISTANT Resistant     CEFAZOLIN <=4 SENSITIVE Sensitive     CEFTRIAXONE <=1 SENSITIVE Sensitive     CIPROFLOXACIN <=0.25 SENSITIVE Sensitive     GENTAMICIN <=1 SENSITIVE Sensitive     IMIPENEM <=0.25 SENSITIVE Sensitive     NITROFURANTOIN <=16 SENSITIVE Sensitive     TRIMETH/SULFA <=20 SENSITIVE Sensitive     AMPICILLIN/SULBACTAM >=32 RESISTANT Resistant     PIP/TAZO <=4 SENSITIVE Sensitive     Extended ESBL NEGATIVE Sensitive     *  >=100,000 COLONIES/mL ESCHERICHIA COLI  Blood Culture (routine x 2)     Status: None (Preliminary result)   Collection Time: 05/14/19  7:25 PM   Specimen: BLOOD RIGHT HAND  Result Value Ref Range Status   Specimen Description BLOOD RIGHT HAND  Final   Special Requests   Final    BOTTLES DRAWN AEROBIC AND ANAEROBIC Blood Culture adequate volume   Culture   Final    NO GROWTH 4 DAYS Performed at [facility], [address], [place], [place] [address]    Report Status PENDING  Incomplete  Blood Culture (routine x 2)     Status: None (Preliminary result)   Collection Time: 05/14/19  7:25 PM   Specimen: BLOOD LEFT HAND  Result Value Ref Range Status   Specimen Description BLOOD LEFT HAND  Final   Special Requests   Final    BOTTLES DRAWN AEROBIC AND ANAEROBIC Blood Culture adequate volume   Culture   Final    NO GROWTH 4 DAYS Performed at [facility], [address], [place], [place] [address]    Report Status PENDING  Incomplete  MRSA PCR Screening     Status: Abnormal  Collection Time: 05/14/19 10:36 PM   Specimen: Nasal Mucosa; Nasopharyngeal  Result Value Ref Range Status   MRSA by PCR POSITIVE (A) NEGATIVE Final    Comment:        The GeneXpert MRSA Assay (FDA approved for NASAL specimens only), is one component of a comprehensive MRSA colonization surveillance program. It is not intended to diagnose MRSA infection nor to guide or monitor treatment for MRSA infections. RESULT CALLED TO, READ BACK BY AND VERIFIED WITH: BROWN S. AT [id number] ON [id number] BY THOMPSON S. Performed at [facility], [address], [place],  [address]      Labs: BNP (last 3 results) Recent Labs    05/15/19 0443  BNP [id number]*   Basic Metabolic Panel: Recent Labs  Lab 05/13/19 1033 05/14/19 1756 05/14/19 1801 05/15/19 0443 05/16/19 0347  NA 143 138 139 138 138  K 5.2* 5.1 5.2* 4.5 5.0  CL 107 103 106 112* 107  CO2 23 16*  --  19* 21*  GLUCOSE 73 193* 177* 132* 131*  BUN 46*  44* 40* 43* 44*  CREATININE 2.35* 2.78* 2.60* 2.22* 3.07*  CALCIUM 8.7* 8.4*  --  7.4* 8.1*  MG  --   --   --   --  2.0  PHOS  --   --   --   --  4.4   Liver Function Tests: Recent Labs  Lab 05/14/19 1756 05/15/19 0443 05/16/19 0347  AST 37 28 48*  ALT 14 14 19   ALKPHOS 50 41 54  BILITOT 0.8 0.7 0.7  PROT 5.3* 4.4* 4.9*  ALBUMIN 3.0* 2.5* 2.7*   No results for input(s): LIPASE, AMYLASE in the last 168 hours. No results for input(s): AMMONIA in the last 168 hours. CBC: Recent Labs  Lab 05/14/19 1756  05/15/19 0443 05/15/19 0850 05/15/19 1526 05/16/19 0347 05/17/19 0315  WBC 9.2  --  4.7  --  5.2 5.5 4.6  NEUTROABS 3.9  --   --   --  3.3 3.4  --   HGB 10.8*   < > 9.2* 10.0* 9.9*  9.8* 9.6* 9.0*  HCT 35.3*   < > 28.0* 30.6* 30.8*  30.9* 29.5* 27.1*  MCV 104.1*  --  97.2  --  99.4 97.0 97.1  PLT 72*  --  43*  --  49* 44* 39*   < > = values in this interval not displayed.   Cardiac Enzymes: No results for input(s): CKTOTAL, CKMB, CKMBINDEX, TROPONINI in the last 168 hours. BNP: Invalid input(s): POCBNP CBG: Recent Labs  Lab 05/16/19 1157 05/16/19 1611 05/17/19 0745 05/17/19 0902 05/17/19 1212  GLUCAP 110* 138* 162* 157* 95   D-Dimer No results for input(s): DDIMER in the last 72 hours. Hgb A1c No results for input(s): HGBA1C in the last 72 hours. Lipid Profile No results for input(s): CHOL, HDL, LDLCALC, TRIG, CHOLHDL, LDLDIRECT in the last 72 hours. Thyroid function studies No results for input(s): TSH, T4TOTAL, T3FREE, THYROIDAB in the last 72 hours.  Invalid input(s): FREET3 Anemia work up No results for input(s): VITAMINB12, FOLATE, FERRITIN, TIBC, IRON, RETICCTPCT in the last 72 hours. Urinalysis    Component Value Date/Time   COLORURINE YELLOW 05/14/2019 1824   APPEARANCEUR HAZY (A) 05/14/2019 1824   LABSPEC 1.017 05/14/2019 1824   PHURINE 5.0 05/14/2019 1824   GLUCOSEU NEGATIVE 05/14/2019 1824   HGBUR SMALL (A) 05/14/2019 1824   BILIRUBINUR  NEGATIVE 05/14/2019 1824   KETONESUR NEGATIVE 05/14/2019 1824   PROTEINUR 30 (A)  05/14/2019 1824   UROBILINOGEN 1.0 10/07/2014 1726   NITRITE NEGATIVE 05/14/2019 1824   LEUKOCYTESUR MODERATE (A) 05/14/2019 1824   Sepsis Labs Invalid input(s): PROCALCITONIN,  WBC,  LACTICIDVEN Microbiology Recent Results (from the past 240 hour(s))  SARS Coronavirus 2 [facility] order, Performed in [facility] hospital lab) Nasopharyngeal Nasopharyngeal Swab     Status: None   Collection Time: 05/13/19  8:00 AM   Specimen: Nasopharyngeal Swab  Result Value Ref Range Status   SARS Coronavirus 2 NEGATIVE NEGATIVE Final    Comment: (NOTE) If result is NEGATIVE SARS-CoV-2 target nucleic acids are NOT DETECTED. The SARS-CoV-2 RNA is generally detectable in upper and lower  respiratory specimens during the acute phase of infection. The lowest  concentration of SARS-CoV-2 viral copies this assay can detect is 250  copies / mL. A negative result does not preclude SARS-CoV-2 infection  and should not be used as the sole basis for treatment or other  patient management decisions.  A negative result may occur with  improper specimen collection / handling, submission of specimen other  than nasopharyngeal swab, presence of viral mutation(s) within the  areas targeted by this assay, and inadequate number of viral copies  (<250 copies / mL). A negative result must be combined with clinical  observations, patient history, and epidemiological information. If result is POSITIVE SARS-CoV-2 target nucleic acids are DETECTED. The SARS-CoV-2 RNA is generally detectable in upper and lower  respiratory specimens dur ing the acute phase of infection.  Positive  results are indicative of active infection with SARS-CoV-2.  Clinical  correlation with patient history and other diagnostic information is  necessary to determine patient infection status.  Positive results do  not rule out bacterial infection or co-infection  with other viruses. If result is PRESUMPTIVE POSTIVE SARS-CoV-2 nucleic acids MAY BE PRESENT.   A presumptive positive result was obtained on the submitted specimen  and confirmed on repeat testing.  While 2019 novel coronavirus  (SARS-CoV-2) nucleic acids may be present in the submitted sample  additional confirmatory testing may be necessary for epidemiological  and / or clinical management purposes  to differentiate between  SARS-CoV-2 and other Sarbecovirus currently known to infect humans.  If clinically indicated additional testing with an alternate test  methodology [phone]) is advised. The SARS-CoV-2 RNA is generally  detectable in upper and lower respiratory sp ecimens during the acute  phase of infection. The expected result is Negative. Fact Sheet for Patients:  StrictlyIdeas.no Fact Sheet for Healthcare Providers: BankingDealers.co.za This test is not yet approved or cleared by the [place] FDA and has been authorized for detection and/or diagnosis of SARS-CoV-2 by FDA under an Emergency Use Authorization (EUA).  This EUA will remain in effect (meaning this test can be used) for the duration of the COVID-19 declaration under Section 564(b)(1) of the Act, 21 U.S.C. section 360bbb-3(b)(1), unless the authorization is terminated or revoked sooner. Performed at [facility], [address], [place], [place] [address]   Urine culture     Status: Abnormal   Collection Time: 05/14/19  6:24 PM   Specimen: In/Out Cath Urine  Result Value Ref Range Status   Specimen Description   Final    IN/OUT CATH URINE Performed at [facility], [address] [place], [place] [address]    Special Requests   Final    NONE Performed at [facility], [address] [place], [place] [address]    Culture >=100,000 COLONIES/mL ESCHERICHIA COLI (A)  Final  Report Status 05/17/2019 FINAL  Final   Organism ID, Bacteria ESCHERICHIA COLI (A)   Final      Susceptibility   Escherichia coli - MIC*    AMPICILLIN >=32 RESISTANT Resistant     CEFAZOLIN <=4 SENSITIVE Sensitive     CEFTRIAXONE <=1 SENSITIVE Sensitive     CIPROFLOXACIN <=0.25 SENSITIVE Sensitive     GENTAMICIN <=1 SENSITIVE Sensitive     IMIPENEM <=0.25 SENSITIVE Sensitive     NITROFURANTOIN <=16 SENSITIVE Sensitive     TRIMETH/SULFA <=20 SENSITIVE Sensitive     AMPICILLIN/SULBACTAM >=32 RESISTANT Resistant     PIP/TAZO <=4 SENSITIVE Sensitive     Extended ESBL NEGATIVE Sensitive     * >=100,000 COLONIES/mL ESCHERICHIA COLI  Blood Culture (routine x 2)     Status: None (Preliminary result)   Collection Time: 05/14/19  7:25 PM   Specimen: BLOOD RIGHT HAND  Result Value Ref Range Status   Specimen Description BLOOD RIGHT HAND  Final   Special Requests   Final    BOTTLES DRAWN AEROBIC AND ANAEROBIC Blood Culture adequate volume   Culture   Final    NO GROWTH 4 DAYS Performed at [facility], [address], [place], [place] [address]    Report Status PENDING  Incomplete  Blood Culture (routine x 2)     Status: None (Preliminary result)   Collection Time: 05/14/19  7:25 PM   Specimen: BLOOD LEFT HAND  Result Value Ref Range Status   Specimen Description BLOOD LEFT HAND  Final   Special Requests   Final    BOTTLES DRAWN AEROBIC AND ANAEROBIC Blood Culture adequate volume   Culture   Final    NO GROWTH 4 DAYS Performed at [facility], [address] [place], [place] [address]    Report Status PENDING  Incomplete  MRSA PCR Screening     Status: Abnormal   Collection Time: 05/14/19 10:36 PM   Specimen: Nasal Mucosa; Nasopharyngeal  Result Value Ref Range Status   MRSA by PCR POSITIVE (A) NEGATIVE Final    Comment:        The GeneXpert MRSA Assay (FDA approved for NASAL specimens only), is one component of a comprehensive MRSA colonization surveillance program. It is not intended to diagnose MRSA infection nor to guide or monitor treatment for MRSA  infections. RESULT CALLED TO, READ BACK BY AND VERIFIED WITH: BROWN S. AT [id number] ON [id number] BY THOMPSON S. Performed at [facility], [address] [place], [place] [address]      Time coordinating discharge: 35 minutes  SIGNED:   Aline August, MD  [facility] 05/19/2019, 8:20 AM

## 2019-05-19 NOTE — Progress Notes (Signed)
Nsg Discharge Note  Admit Date:  05/14/2019 Discharge date: 05/19/2019  Report given to nurse Robin at [facility].  Patient will be transported via [facility] to facility.    Derrick Butler to be D/C'd [facility] per MD order.  AVS completed.  Copy for chart, and copy for patient signed, and dated. Caregiver able to verbalize understanding.  Discharge Medication: Allergies as of 05/19/2019   No Known Allergies     Medication List    STOP taking these medications   aspirin EC 81 MG tablet   atorvastatin 20 MG tablet Commonly known as: LIPITOR   Biotin 5 MG Tbdp   cholecalciferol 25 MCG (1000 UT) tablet Commonly known as: VITAMIN D3   famotidine 20 MG tablet Commonly known as: PEPCID   fluticasone 50 MCG/ACT nasal spray Commonly known as: FLONASE   labetalol 100 MG tablet Commonly known as: NORMODYNE   Levemir FlexPen 100 UNIT/ML Pen Generic drug: Insulin Detemir   omega-3 acid ethyl esters 1 g capsule Commonly known as: LOVAZA     TAKE these medications   acetaminophen 325 MG tablet Commonly known as: TYLENOL Take 650 mg by mouth every 6 (six) hours as needed for mild pain, moderate pain or fever.   divalproex 125 MG capsule Commonly known as: DEPAKOTE SPRINKLE Take 500 mg by mouth 3 (three) times daily. (0900, 1300, & 1700)   glycopyrrolate 1 MG tablet Commonly known as: ROBINUL Take 1 tablet (1 mg total) by mouth every 4 (four) hours as needed (excessive secretions).   haloperidol 0.5 MG tablet Commonly known as: HALDOL Take 1 tablet (0.5 mg total) by mouth every 4 (four) hours as needed for agitation (or delirium).   LORazepam 1 MG tablet Commonly known as: Ativan Take 1 tablet (1 mg total) by mouth every 6 (six) hours as needed for anxiety.   morphine 20 MG/5ML solution Take 2.5 mLs (10 mg total) by mouth every 2 (two) hours as needed for pain.   ondansetron 4 MG tablet Commonly known as: ZOFRAN Take 1 tablet (4 mg total) by mouth  every 6 (six) hours as needed for nausea.   QUEtiapine 300 MG tablet Commonly known as: SEROQUEL Take 300 mg by mouth 2 (two) times daily. (0900 & 2100)   risperiDONE 1 MG tablet Commonly known as: RisperDAL Take 1 tablet (1 mg total) by mouth 3 (three) times daily.   trihexyphenidyl 5 MG tablet Commonly known as: ARTANE Take 5 mg by mouth 2 (two) times daily with a meal. (0900 & 2100)       Discharge Assessment: Vitals:   05/18/19 2200 05/19/19 0829  BP:  132/77  Pulse:  87  Resp:  (!) 24  Temp: 97.8 F (36.6 C) 98.3 F (36.8 C)  SpO2:  100%   Skin clean, dry and intact without evidence of skin break down, no evidence of skin tears noted. Ecchymosis on left side flank. IV catheter discontinued intact. Site without signs and symptoms of complications - no redness or edema noted at insertion site, patient denies c/o pain - only slight tenderness at site.  Dressing with slight pressure applied.  D/c Instructions-Education: Discharge report given to [employer] banker, corporate) at [place].  D/c education completed with patient/family including follow up instructions, medication list, d/c activities limitations if indicated, with other d/c instructions as indicated by MD.  Patient will be transported via [facility].  [name], RN 05/19/2019 3:39 PM

## 2019-05-19 NOTE — Progress Notes (Signed)
Nutrition Brief Note  Chart reviewed. Pt now transitioning to comfort care.  No further nutrition interventions warranted at this time.  Please re-consult as needed.   Miracle Criado A. Yan Okray, RD, LDN, CDCES Registered Dietitian II Certified Diabetes Care and Education Specialist Pager: [phone] After hours Pager: [phone]  

## 2019-05-19 NOTE — TOC Transition Note (Addendum)
Transition of Care [facility]) - CM/SW Discharge Note   Patient Details  Name: Derrick Butler MRN: [id number] Date of Birth: 19-Sep-1955  Transition of Care [facility]) CM/SW Contact:  Benard Halsted, LCSW Phone Number: 05/19/2019, 2:29 PM   Clinical Narrative:    Patient will DC to: [facility]  Anticipated DC date: 05/19/19 Family notified: Holley Raring Transport by: Corey Harold 2:30pm   Per MD patient ready for DC to hospice. RN, patient, patient's family, and facility notified of DC. Discharge Summary and FL2 sent to facility. RN to call report prior to discharge ([phone]). DC packet on chart. Ambulance transport requested for patient.   CSW will sign off for now as social work intervention is no longer needed. Please consult [place] again if new needs arise.  Cedric Fishman, LCSW Clinical Social Worker [phone]    Final next level of care: [place] Barriers to Discharge: No Barriers Identified   Patient Goals and CMS Choice Patient states their goals for this hospitalization and ongoing recovery are:: return to faclity      Discharge Placement                Patient to be transferred to facility by: [facility] Name of family member notified: Glenda Patient and family notified of of transfer: 05/19/19  Discharge Plan and Services                          HH Arranged: NA          Social Determinants of Health (SDOH) Interventions     Readmission Risk Interventions No flowsheet data found.

## 2019-05-19 NOTE — Care Management Important Message (Signed)
Important Message  Patient Details  Name: Derrick Butler MRN: [id number] Date of Birth: 01/23/1956   Medicare Important Message Given:  Yes  Due to illness Patient could not sign.  Unsigned copy left at bedside   Orbie Pyo 05/19/2019, 3:52 PM

## 2019-06-05 DEATH — deceased

## 2019-06-07 DIAGNOSIS — Z515 Encounter for palliative care: Secondary | ICD-10-CM

## 2019-07-01 ENCOUNTER — Ambulatory Visit: Payer: Medicare Other | Admitting: Gastroenterology

## 2021-05-10 NOTE — Progress Notes (Unsigned)
No show for appt.
